# Patient Record
Sex: Female | Born: 1950 | Race: Black or African American | Hispanic: No | Marital: Single | Smoking: Former smoker
Health system: Southern US, Community
[De-identification: ages and names within clinical notes are randomized; demographics above are authoritative.]

## PROBLEM LIST (undated history)

## (undated) DIAGNOSIS — F32A Depression, unspecified: Secondary | ICD-10-CM

## (undated) DIAGNOSIS — E039 Hypothyroidism, unspecified: Secondary | ICD-10-CM

## (undated) DIAGNOSIS — M1990 Unspecified osteoarthritis, unspecified site: Secondary | ICD-10-CM

## (undated) DIAGNOSIS — W19XXXA Unspecified fall, initial encounter: Secondary | ICD-10-CM

## (undated) DIAGNOSIS — K2270 Barrett's esophagus without dysplasia: Secondary | ICD-10-CM

## (undated) DIAGNOSIS — D126 Benign neoplasm of colon, unspecified: Secondary | ICD-10-CM

## (undated) DIAGNOSIS — I82409 Acute embolism and thrombosis of unspecified deep veins of unspecified lower extremity: Secondary | ICD-10-CM

## (undated) DIAGNOSIS — F329 Major depressive disorder, single episode, unspecified: Secondary | ICD-10-CM

## (undated) DIAGNOSIS — F419 Anxiety disorder, unspecified: Secondary | ICD-10-CM

## (undated) DIAGNOSIS — I10 Essential (primary) hypertension: Secondary | ICD-10-CM

## (undated) DIAGNOSIS — M51369 Other intervertebral disc degeneration, lumbar region without mention of lumbar back pain or lower extremity pain: Secondary | ICD-10-CM

## (undated) DIAGNOSIS — I7090 Unspecified atherosclerosis: Secondary | ICD-10-CM

## (undated) DIAGNOSIS — M5136 Other intervertebral disc degeneration, lumbar region: Secondary | ICD-10-CM

## (undated) DIAGNOSIS — K219 Gastro-esophageal reflux disease without esophagitis: Secondary | ICD-10-CM

## (undated) DIAGNOSIS — Z923 Personal history of irradiation: Secondary | ICD-10-CM

## (undated) DIAGNOSIS — K589 Irritable bowel syndrome without diarrhea: Secondary | ICD-10-CM

## (undated) DIAGNOSIS — G2581 Restless legs syndrome: Secondary | ICD-10-CM

## (undated) DIAGNOSIS — A048 Other specified bacterial intestinal infections: Secondary | ICD-10-CM

## (undated) DIAGNOSIS — Y92009 Unspecified place in unspecified non-institutional (private) residence as the place of occurrence of the external cause: Secondary | ICD-10-CM

## (undated) DIAGNOSIS — I6529 Occlusion and stenosis of unspecified carotid artery: Secondary | ICD-10-CM

## (undated) DIAGNOSIS — R7303 Prediabetes: Secondary | ICD-10-CM

## (undated) DIAGNOSIS — H269 Unspecified cataract: Secondary | ICD-10-CM

## (undated) DIAGNOSIS — E785 Hyperlipidemia, unspecified: Secondary | ICD-10-CM

## (undated) DIAGNOSIS — E0590 Thyrotoxicosis, unspecified without thyrotoxic crisis or storm: Secondary | ICD-10-CM

## (undated) DIAGNOSIS — E119 Type 2 diabetes mellitus without complications: Secondary | ICD-10-CM

## (undated) DIAGNOSIS — I779 Disorder of arteries and arterioles, unspecified: Secondary | ICD-10-CM

## (undated) DIAGNOSIS — K5790 Diverticulosis of intestine, part unspecified, without perforation or abscess without bleeding: Secondary | ICD-10-CM

## (undated) DIAGNOSIS — I729 Aneurysm of unspecified site: Secondary | ICD-10-CM

## (undated) DIAGNOSIS — K289 Gastrojejunal ulcer, unspecified as acute or chronic, without hemorrhage or perforation: Secondary | ICD-10-CM

## (undated) HISTORY — DX: Benign neoplasm of colon, unspecified: D12.6

## (undated) HISTORY — DX: Other intervertebral disc degeneration, lumbar region: M51.36

## (undated) HISTORY — DX: Occlusion and stenosis of unspecified carotid artery: I65.29

## (undated) HISTORY — DX: Unspecified osteoarthritis, unspecified site: M19.90

## (undated) HISTORY — DX: Gastro-esophageal reflux disease without esophagitis: K21.9

## (undated) HISTORY — DX: Barrett's esophagus without dysplasia: K22.70

## (undated) HISTORY — DX: Anxiety disorder, unspecified: F41.9

## (undated) HISTORY — PX: TUBAL LIGATION: SHX77

## (undated) HISTORY — DX: Other specified bacterial intestinal infections: A04.8

## (undated) HISTORY — DX: Unspecified atherosclerosis: I70.90

## (undated) HISTORY — DX: Diverticulosis of intestine, part unspecified, without perforation or abscess without bleeding: K57.90

## (undated) HISTORY — DX: Acute embolism and thrombosis of unspecified deep veins of unspecified lower extremity: I82.409

## (undated) HISTORY — DX: Restless legs syndrome: G25.81

## (undated) HISTORY — DX: Other intervertebral disc degeneration, lumbar region without mention of lumbar back pain or lower extremity pain: M51.369

## (undated) HISTORY — PX: BRAIN SURGERY: SHX531

## (undated) HISTORY — DX: Unspecified fall, initial encounter: W19.XXXA

## (undated) HISTORY — DX: Unspecified place in unspecified non-institutional (private) residence as the place of occurrence of the external cause: Y92.009

## (undated) HISTORY — DX: Irritable bowel syndrome, unspecified: K58.9

## (undated) HISTORY — PX: LEG SURGERY: SHX1003

---
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
## 1998-12-18 ENCOUNTER — Other Ambulatory Visit: Admission: RE | Admit: 1998-12-18 | Discharge: 1998-12-18 | Payer: Self-pay | Admitting: Obstetrics and Gynecology

## 1999-09-12 ENCOUNTER — Emergency Department (HOSPITAL_COMMUNITY): Admission: EM | Admit: 1999-09-12 | Discharge: 1999-09-12 | Payer: Self-pay | Admitting: Emergency Medicine

## 2000-04-15 ENCOUNTER — Emergency Department (HOSPITAL_COMMUNITY): Admission: EM | Admit: 2000-04-15 | Discharge: 2000-04-15 | Payer: Self-pay | Admitting: Emergency Medicine

## 2000-04-15 ENCOUNTER — Encounter: Payer: Self-pay | Admitting: Emergency Medicine

## 2001-08-28 ENCOUNTER — Encounter: Payer: Self-pay | Admitting: Emergency Medicine

## 2001-08-28 ENCOUNTER — Emergency Department (HOSPITAL_COMMUNITY): Admission: EM | Admit: 2001-08-28 | Discharge: 2001-08-28 | Payer: Self-pay | Admitting: Emergency Medicine

## 2002-09-30 ENCOUNTER — Ambulatory Visit (HOSPITAL_COMMUNITY): Admission: RE | Admit: 2002-09-30 | Discharge: 2002-09-30 | Payer: Self-pay | Admitting: Internal Medicine

## 2002-09-30 ENCOUNTER — Encounter: Payer: Self-pay | Admitting: Internal Medicine

## 2002-11-01 ENCOUNTER — Ambulatory Visit (HOSPITAL_COMMUNITY): Admission: RE | Admit: 2002-11-01 | Discharge: 2002-11-01 | Payer: Self-pay | Admitting: Internal Medicine

## 2002-11-01 ENCOUNTER — Encounter: Payer: Self-pay | Admitting: Internal Medicine

## 2002-12-26 ENCOUNTER — Ambulatory Visit (HOSPITAL_COMMUNITY): Admission: RE | Admit: 2002-12-26 | Discharge: 2002-12-26 | Payer: Self-pay | Admitting: Internal Medicine

## 2003-02-06 ENCOUNTER — Emergency Department (HOSPITAL_COMMUNITY): Admission: EM | Admit: 2003-02-06 | Discharge: 2003-02-06 | Payer: Self-pay | Admitting: Emergency Medicine

## 2003-11-14 ENCOUNTER — Ambulatory Visit: Payer: Self-pay | Admitting: *Deleted

## 2003-12-18 ENCOUNTER — Ambulatory Visit: Payer: Self-pay | Admitting: Family Medicine

## 2004-06-15 ENCOUNTER — Ambulatory Visit: Payer: Self-pay | Admitting: Family Medicine

## 2004-08-19 ENCOUNTER — Ambulatory Visit: Payer: Self-pay | Admitting: Obstetrics and Gynecology

## 2004-08-20 ENCOUNTER — Ambulatory Visit: Payer: Self-pay | Admitting: Family Medicine

## 2004-09-03 ENCOUNTER — Ambulatory Visit (HOSPITAL_COMMUNITY): Admission: RE | Admit: 2004-09-03 | Discharge: 2004-09-03 | Payer: Self-pay | Admitting: *Deleted

## 2005-02-17 ENCOUNTER — Emergency Department (HOSPITAL_COMMUNITY): Admission: EM | Admit: 2005-02-17 | Discharge: 2005-02-17 | Payer: Self-pay | Admitting: Emergency Medicine

## 2005-03-02 ENCOUNTER — Ambulatory Visit: Payer: Self-pay | Admitting: Family Medicine

## 2005-04-07 ENCOUNTER — Ambulatory Visit: Payer: Self-pay | Admitting: Family Medicine

## 2005-05-02 ENCOUNTER — Ambulatory Visit: Payer: Self-pay | Admitting: Family Medicine

## 2005-05-16 ENCOUNTER — Encounter: Admission: RE | Admit: 2005-05-16 | Discharge: 2005-05-16 | Payer: Self-pay | Admitting: Orthopedic Surgery

## 2005-07-27 ENCOUNTER — Ambulatory Visit: Payer: Self-pay | Admitting: Obstetrics & Gynecology

## 2005-07-27 ENCOUNTER — Encounter: Payer: Self-pay | Admitting: Obstetrics & Gynecology

## 2005-08-09 ENCOUNTER — Encounter: Admission: RE | Admit: 2005-08-09 | Discharge: 2005-08-09 | Payer: Self-pay | Admitting: Obstetrics & Gynecology

## 2005-08-22 ENCOUNTER — Emergency Department (HOSPITAL_COMMUNITY): Admission: EM | Admit: 2005-08-22 | Discharge: 2005-08-22 | Payer: Self-pay | Admitting: Emergency Medicine

## 2005-09-13 ENCOUNTER — Ambulatory Visit: Payer: Self-pay | Admitting: Family Medicine

## 2005-09-20 ENCOUNTER — Encounter: Admission: RE | Admit: 2005-09-20 | Discharge: 2005-09-20 | Payer: Self-pay | Admitting: Family Medicine

## 2006-12-06 ENCOUNTER — Ambulatory Visit (HOSPITAL_COMMUNITY): Admission: RE | Admit: 2006-12-06 | Discharge: 2006-12-06 | Payer: Self-pay | Admitting: Family Medicine

## 2007-03-19 ENCOUNTER — Other Ambulatory Visit: Admission: RE | Admit: 2007-03-19 | Discharge: 2007-03-19 | Payer: Self-pay | Admitting: Family Medicine

## 2008-05-06 ENCOUNTER — Ambulatory Visit (HOSPITAL_COMMUNITY): Admission: RE | Admit: 2008-05-06 | Discharge: 2008-05-06 | Payer: Self-pay | Admitting: Family Medicine

## 2008-05-08 ENCOUNTER — Other Ambulatory Visit: Admission: RE | Admit: 2008-05-08 | Discharge: 2008-05-08 | Payer: Self-pay | Admitting: Family Medicine

## 2008-05-08 ENCOUNTER — Encounter: Admission: RE | Admit: 2008-05-08 | Discharge: 2008-05-08 | Payer: Self-pay | Admitting: Family Medicine

## 2008-05-19 ENCOUNTER — Ambulatory Visit (HOSPITAL_COMMUNITY): Admission: RE | Admit: 2008-05-19 | Discharge: 2008-05-19 | Payer: Self-pay | Admitting: Family Medicine

## 2008-12-19 ENCOUNTER — Emergency Department (HOSPITAL_COMMUNITY): Admission: EM | Admit: 2008-12-19 | Discharge: 2008-12-19 | Payer: Self-pay | Admitting: Emergency Medicine

## 2009-05-28 ENCOUNTER — Ambulatory Visit (HOSPITAL_COMMUNITY): Admission: RE | Admit: 2009-05-28 | Discharge: 2009-05-28 | Payer: Self-pay | Admitting: Family Medicine

## 2009-09-23 ENCOUNTER — Other Ambulatory Visit: Admission: RE | Admit: 2009-09-23 | Discharge: 2009-09-23 | Payer: Self-pay | Admitting: Family Medicine

## 2009-09-28 ENCOUNTER — Encounter: Admission: RE | Admit: 2009-09-28 | Discharge: 2009-09-28 | Payer: Self-pay | Admitting: Family Medicine

## 2009-10-17 ENCOUNTER — Encounter: Admission: RE | Admit: 2009-10-17 | Discharge: 2009-10-17 | Payer: Self-pay | Admitting: Family Medicine

## 2009-11-02 ENCOUNTER — Ambulatory Visit: Payer: Self-pay | Admitting: Surgery

## 2009-11-11 ENCOUNTER — Encounter: Payer: Self-pay | Admitting: Surgery

## 2009-11-27 ENCOUNTER — Ambulatory Visit (HOSPITAL_COMMUNITY): Admission: RE | Admit: 2009-11-27 | Discharge: 2009-11-27 | Payer: Self-pay | Admitting: Interventional Radiology

## 2009-12-04 ENCOUNTER — Encounter: Payer: Self-pay | Admitting: Interventional Radiology

## 2010-01-28 ENCOUNTER — Inpatient Hospital Stay (HOSPITAL_COMMUNITY)
Admission: RE | Admit: 2010-01-28 | Discharge: 2010-01-29 | Payer: Self-pay | Source: Home / Self Care | Attending: Interventional Radiology | Admitting: Interventional Radiology

## 2010-01-28 HISTORY — PX: ANEURYSM COILING: SHX5349

## 2010-02-12 ENCOUNTER — Ambulatory Visit (HOSPITAL_COMMUNITY)
Admission: RE | Admit: 2010-02-12 | Discharge: 2010-02-12 | Payer: Self-pay | Source: Home / Self Care | Attending: Interventional Radiology | Admitting: Interventional Radiology

## 2010-02-28 ENCOUNTER — Encounter: Payer: Self-pay | Admitting: *Deleted

## 2010-04-04 ENCOUNTER — Emergency Department (HOSPITAL_COMMUNITY)
Admission: EM | Admit: 2010-04-04 | Discharge: 2010-04-04 | Disposition: A | Discharge status: Home / Self Care | Payer: BC Managed Care – PPO | Attending: Emergency Medicine | Admitting: Emergency Medicine

## 2010-04-04 ENCOUNTER — Emergency Department (HOSPITAL_COMMUNITY): Payer: BC Managed Care – PPO

## 2010-04-04 DIAGNOSIS — E785 Hyperlipidemia, unspecified: Secondary | ICD-10-CM | POA: Insufficient documentation

## 2010-04-04 DIAGNOSIS — E039 Hypothyroidism, unspecified: Secondary | ICD-10-CM | POA: Insufficient documentation

## 2010-04-04 DIAGNOSIS — I10 Essential (primary) hypertension: Secondary | ICD-10-CM | POA: Insufficient documentation

## 2010-04-04 DIAGNOSIS — R51 Headache: Secondary | ICD-10-CM | POA: Insufficient documentation

## 2010-04-04 LAB — BASIC METABOLIC PANEL
BUN: 20 mg/dL (ref 6–23)
CO2: 24 mEq/L (ref 19–32)
Calcium: 9 mg/dL (ref 8.4–10.5)
Chloride: 104 mEq/L (ref 96–112)
Creatinine, Ser: 1 mg/dL (ref 0.4–1.2)
GFR calc Af Amer: >60 mL/min (ref >60)
GFR calc non Af Amer: 57 mL/min — ABNORMAL LOW (ref >60)
Glucose, Bld: 107 mg/dL — ABNORMAL HIGH (ref 70–99)
Potassium: 3.5 mEq/L (ref 3.5–5.1)
Sodium: 135 mEq/L (ref 135–145)

## 2010-04-04 LAB — CBC
HCT: 43.6 % (ref 36.0–46.0)
Hemoglobin: 14.7 g/dL (ref 12.0–15.0)
MCH: 28.2 pg (ref 26.0–34.0)
MCHC: 33.7 g/dL (ref 30.0–36.0)
MCV: 83.5 fL (ref 78.0–100.0)
Platelets: 264 10*3/uL (ref 150–400)
RBC: 5.22 MIL/uL — ABNORMAL HIGH (ref 3.87–5.11)
RDW: 14.5 % (ref 11.5–15.5)
WBC: 6.9 10*3/uL (ref 4.0–10.5)

## 2010-04-04 LAB — DIFFERENTIAL
Basophils Absolute: 0 10*3/uL (ref 0.0–0.1)
Basophils Relative: 0 % (ref 0–1)
Eosinophils Absolute: 0 10*3/uL (ref 0.0–0.7)
Eosinophils Relative: 0 % (ref 0–5)
Lymphocytes Relative: 31 % (ref 12–46)
Lymphs Abs: 2.2 10*3/uL (ref 0.7–4.0)
Monocytes Absolute: 0.4 10*3/uL (ref 0.1–1.0)
Monocytes Relative: 6 % (ref 3–12)
Neutro Abs: 4.3 10*3/uL (ref 1.7–7.7)
Neutrophils Relative %: 63 % (ref 43–77)

## 2010-04-04 LAB — SEDIMENTATION RATE: Sed Rate: 10 mm/hr (ref 0–22)

## 2010-04-19 LAB — DIFFERENTIAL
Basophils Absolute: 0.1 10*3/uL (ref 0.0–0.1)
Basophils Relative: 1 % (ref 0–1)
Eosinophils Absolute: 0 10*3/uL (ref 0.0–0.7)
Eosinophils Relative: 0 % (ref 0–5)
Lymphocytes Relative: 34 % (ref 12–46)
Lymphs Abs: 2.1 10*3/uL (ref 0.7–4.0)
Monocytes Absolute: 0.5 10*3/uL (ref 0.1–1.0)
Monocytes Relative: 7 % (ref 3–12)
Neutro Abs: 3.5 10*3/uL (ref 1.7–7.7)
Neutrophils Relative %: 58 % (ref 43–77)

## 2010-04-19 LAB — BASIC METABOLIC PANEL
BUN: 20 mg/dL (ref 6–23)
BUN: 8 mg/dL (ref 6–23)
CO2: 26 mEq/L (ref 19–32)
CO2: 30 mEq/L (ref 19–32)
Calcium: 8.3 mg/dL — ABNORMAL LOW (ref 8.4–10.5)
Calcium: 9.2 mg/dL (ref 8.4–10.5)
Chloride: 106 mEq/L (ref 96–112)
Chloride: 109 mEq/L (ref 96–112)
Creatinine, Ser: 0.88 mg/dL (ref 0.4–1.2)
Creatinine, Ser: 1.07 mg/dL (ref 0.4–1.2)
GFR calc Af Amer: >60 mL/min (ref >60)
GFR calc Af Amer: >60 mL/min (ref >60)
GFR calc non Af Amer: 52 mL/min — ABNORMAL LOW (ref >60)
GFR calc non Af Amer: >60 mL/min (ref >60)
Glucose, Bld: 101 mg/dL — ABNORMAL HIGH (ref 70–99)
Glucose, Bld: 115 mg/dL — ABNORMAL HIGH (ref 70–99)
Potassium: 3.5 mEq/L (ref 3.5–5.1)
Potassium: 4.2 mEq/L (ref 3.5–5.1)
Sodium: 139 mEq/L (ref 135–145)
Sodium: 139 mEq/L (ref 135–145)

## 2010-04-19 LAB — CBC
HCT: 39.1 % (ref 36.0–46.0)
HCT: 44.7 % (ref 36.0–46.0)
Hemoglobin: 13.3 g/dL (ref 12.0–15.0)
Hemoglobin: 14.8 g/dL (ref 12.0–15.0)
MCH: 28.4 pg (ref 26.0–34.0)
MCH: 28.7 pg (ref 26.0–34.0)
MCHC: 33.1 g/dL (ref 30.0–36.0)
MCHC: 34 g/dL (ref 30.0–36.0)
MCV: 84.3 fL (ref 78.0–100.0)
MCV: 85.8 fL (ref 78.0–100.0)
Platelets: 230 10*3/uL (ref 150–400)
Platelets: 248 10*3/uL (ref 150–400)
RBC: 4.64 MIL/uL (ref 3.87–5.11)
RBC: 5.21 MIL/uL — ABNORMAL HIGH (ref 3.87–5.11)
RDW: 14.5 % (ref 11.5–15.5)
RDW: 14.6 % (ref 11.5–15.5)
WBC: 6.1 10*3/uL (ref 4.0–10.5)
WBC: 8.1 10*3/uL (ref 4.0–10.5)

## 2010-04-19 LAB — APTT
aPTT: 31 seconds (ref 24–37)
aPTT: 40 seconds — ABNORMAL HIGH (ref 24–37)

## 2010-04-19 LAB — SURGICAL PCR SCREEN
MRSA, PCR: NEGATIVE
Staphylococcus aureus: NEGATIVE

## 2010-04-19 LAB — HEPARIN LEVEL (UNFRACTIONATED): Heparin Unfractionated: <0.1 IU/mL — ABNORMAL LOW (ref 0.30–0.70)

## 2010-04-19 LAB — PROTIME-INR
INR: 0.93 (ref 0.00–1.49)
INR: 1.02 (ref 0.00–1.49)
Prothrombin Time: 12.7 seconds (ref 11.6–15.2)
Prothrombin Time: 13.6 seconds (ref 11.6–15.2)

## 2010-04-21 LAB — BASIC METABOLIC PANEL
BUN: 18 mg/dL (ref 6–23)
CO2: 30 mEq/L (ref 19–32)
Calcium: 9.3 mg/dL (ref 8.4–10.5)
Chloride: 106 mEq/L (ref 96–112)
Creatinine, Ser: 0.99 mg/dL (ref 0.4–1.2)
GFR calc Af Amer: >60 mL/min (ref >60)
GFR calc non Af Amer: 57 mL/min — ABNORMAL LOW (ref >60)
Glucose, Bld: 94 mg/dL (ref 70–99)
Potassium: 3.7 mEq/L (ref 3.5–5.1)
Sodium: 142 mEq/L (ref 135–145)

## 2010-04-21 LAB — APTT: aPTT: 28 seconds (ref 24–37)

## 2010-04-21 LAB — PROTIME-INR
INR: 0.97 (ref 0.00–1.49)
Prothrombin Time: 13.1 seconds (ref 11.6–15.2)

## 2010-04-21 LAB — CBC
HCT: 46.8 % — ABNORMAL HIGH (ref 36.0–46.0)
Hemoglobin: 16.1 g/dL — ABNORMAL HIGH (ref 12.0–15.0)
MCH: 29.3 pg (ref 26.0–34.0)
MCHC: 34.4 g/dL (ref 30.0–36.0)
MCV: 85.1 fL (ref 78.0–100.0)
Platelets: 238 10*3/uL (ref 150–400)
RBC: 5.5 MIL/uL — ABNORMAL HIGH (ref 3.87–5.11)
RDW: 14.4 % (ref 11.5–15.5)
WBC: 5.6 10*3/uL (ref 4.0–10.5)

## 2010-05-12 LAB — URINALYSIS, ROUTINE W REFLEX MICROSCOPIC
Bilirubin Urine: NEGATIVE
Glucose, UA: NEGATIVE mg/dL
Hgb urine dipstick: NEGATIVE
Ketones, ur: NEGATIVE mg/dL
Nitrite: NEGATIVE
Protein, ur: NEGATIVE mg/dL
Specific Gravity, Urine: 1.01 (ref 1.005–1.030)
Urobilinogen, UA: 1 mg/dL (ref 0.0–1.0)
pH: 7.5 (ref 5.0–8.0)

## 2010-05-12 LAB — URINE MICROSCOPIC-ADD ON

## 2010-06-14 ENCOUNTER — Other Ambulatory Visit (HOSPITAL_COMMUNITY): Payer: Self-pay | Admitting: Interventional Radiology

## 2010-06-14 DIAGNOSIS — I771 Stricture of artery: Secondary | ICD-10-CM

## 2010-06-22 NOTE — Assessment & Plan Note (Signed)
OFFICE VISIT   Vicki Page, Vicki Page  DOB:  [birth year]                                       11/02/2009  ZOXWR#:[id number]   REASON FOR VISIT:  Evaluate carotid disease.   This is a 60 year old female who comes to me with the diagnosis of  headaches.  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   The patient states that within the last month or two she has been  developing headaches.  They are associated with an aura.  She describe  as near passing out and blurry vision.  She has had several these  episodes, the most recent was a couple of weeks ago.  They are  aggravated by loud noise.  They are relieved by getting into a quiet  dark place.  She has a history of being diagnosed with migraines.   The patient is medically managed hypertension and hypercholesterolemia.   REVIEW OF SYSTEMS:  GI:  Negative for diarrhea or constipation.  NEURO:  Positive for dizziness and headaches.  MUSCULOSKELETAL:  Positive arthritis, joint pain, muscle pain.  All other review systems negative.   PAST MEDICAL HISTORY:  Hypertension, hypercholesterolemia, thyroid  insufficiency, migraine headaches.   SOCIAL HISTORY:  She is single, works as a Architectural technologist, smokes a  half pack a day, does not drink alcohol.   FAMILY HISTORY:  Positive for premature cardiovascular disease in her  mother, father and brother.   PHYSICAL EXAMINATION:  Heart rate 91, blood pressure 145/81, temperature  is 98.2.  GENERAL:  She is well-appearing, in no distress.  HEENT:  Within normal limits.  LUNGS:  Clear bilaterally.  CARDIOVASCULAR:  Regular rate and rhythm.  No carotid bruits.  Palpable  pedal pulses.  ABDOMEN:  Soft, nontender.  MUSCULOSKELETAL:  Without major deformity.  NEURO:  No focal deficits.  SKIN:  Without rash.   Page  have reviewed her MRA and her carotid ultrasound which showed the  above findings.   ASSESSMENT/PLAN:  Page agree that the patient's symptoms sound most  consistent with migraine with aura.  I do not think her atherosclerotic  changes in her carotid artery are contributing to her symptoms.  She  does not endorse any symptoms consistent with a stroke or TIA.  Her  headaches are bilateral and her stenosis is largely on the right  intracranial portion of the carotid artery.  Page have recommended to the  patient that she receive annual follow-up for extracranial carotid  disease.  Page have scheduled her ultrasound in my office in 1 year.  Since  she does have severe stenosis in the intracranial carotid artery, Page am  recommending that she see Dr. Corliss Skains for further evaluation of this.  However, with a current symptomatology, I do not think that she would  require any intervention at this time.  Page would recommend continuing  with her medical management of her hypercholesterolemia and hypertension  as well as continuation of an aspirin.     Jorge Ny, MD  Electronically Signed   VWB/MEDQ  D:  11/02/2009  T:  11/03/2009  Job:  [id number]

## 2010-06-25 NOTE — Group Therapy Note (Signed)
NAME:  Vicki Page, Vicki Page NO.:  [id number]   MEDICAL RECORD NO.:  [id number]          PATIENT TYPE:  WOC   LOCATION:  [facility]                   FACILITY:  [facility]   PHYSICIAN:  Elsie Lincoln, MD      DATE OF BIRTH:  1950-12-15   DATE OF SERVICE:                                    CLINIC NOTE   Patient is a 60 year old female who was last seen in our clinic a year ago  for postmenopausal bleeding.  An endometrial biopsy was never able to be  done.  It was unable to admit the __________ to the os.  She did have a  transvaginal ultrasound which showed a 2.5 mm stripe.  This is reassuring.  The patient has had no postmenopausal bleeding since then.  She is in need  for a Pap smear today.  The patient is not sexually active, and she does not  have any signs of urinary incontinence.  She is complaining of pain in the  left perineal area, and she thinks a bug bit her.   PAST MEDICAL HISTORY:  No change.   PAST SURGICAL HISTORY:  No change.   MEDICATIONS:  Norvasc, multivitamin, Levothroid, hydrochlorothiazide.   GYN HISTORY:  No change.   REVIEW OF SYSTEMS:  Negative except for when the patient raises her arms  over her head, especially the right arm, she gets chest pain.  This could be  cardiac in origin and deserves a workup through [facility], which is her  primary care physician.   PHYSICAL EXAMINATION:  VITAL SIGNS:  Temperature 97.5, pulse 83, blood  pressure 138/82.  Weight 155.8.  Height 5 foot 2.  GENERAL:  Well-developed and well-nourished in no apparent distress.  HEENT:  Normocephalic and atraumatic.  NECK:  Thyroid:  No masses.  LUNGS:  Clear to auscultation bilaterally.  HEART:  Regular rate and rhythm.  ABDOMEN:  Soft, nontender, nondistended.  No hernia.  A scarred midline  incision from tubal ligation gone awry.  PELVIC:  Genitalia:  Tanner V.  Perineum:  There is a little bump that most  likely represents an ingrown hair on the left side of the  introitus.  No  evidence of an insect bite, herpes, or precancerous lesion.  Most likely an  ingrown hair.  Urethra nontender.  Bladder cystocele.  Vagina atrophic.  Cervix closed, nontender.  Uterus mobile, nontender.  Adnexa:  No masses,  nontender.  No rectovaginal lesions or bumps.  EXTREMITIES:  Nontender.  No edema.   ASSESSMENT:  A 60 year old female with no postmenopausal bleeding for a year  for a Pap smear.   PLAN:  1.  Patient had a small bump at 7 o'clock on the right breast that was also      painful.  Patient needs a diagnostic mammogram with that.  2.  Pap smear.  3.  Follow up with [facility] for rule out cardiac problems.  4.  Return to clinic in a year.           ______________________________  Elsie Lincoln, MD     KL/MEDQ  D:  07/27/2005  T:  07/27/2005  Job:  [id number]

## 2010-06-25 NOTE — Group Therapy Note (Signed)
[name], Page                ACCOUNT NO.:  [id number]   MEDICAL RECORD NO.:  [id number]          PATIENT TYPE:  WOC   LOCATION:  [facility]                   FACILITY:  [facility]   PHYSICIAN:  Tinnie Gens, MD        DATE OF BIRTH:  06-22-1950   DATE OF SERVICE:  08/20/2004                                    CLINIC NOTE   CHIEF COMPLAINT:  Pressure on vagina and occasional postmenopausal bleeding.   HISTORY OF PRESENT ILLNESS:  The patient is a 60 year old gravida 2, para 2,  who has been postmenopausal since 2004.  Her last Pap was in July 2005, last  mammogram August 2004.  She reports some spotting, but mostly her chief  complaint is pressure in the vagina.  She has noticed that more recently,  she has been having hot flashes and vaginal dryness.   PAST MEDICAL HISTORY:  1.  Arthritis.  2.  Hypertension.  3.  Thyroid problems.   PAST SURGICAL HISTORY:  BTL.   MEDICATIONS:  HCTZ, Zocor, Norvasc, and Levothroid.   ALLERGIES:  1.  DARVON.  2.  TYLENOL NO. 3.   OB HISTORY:  1.  G2.  2.  SVD x 2, the largest of which was 6 pounds 4 ounces.   GYN HISTORY:  1.  Menarche at age 89.  2.  Menopause 2004.  3.  History of abnormal Pap sometime in the 80s with a cold knife cone.  4.  Status post tubal ligation.   FAMILY HISTORY:  Diabetes, heart disease, and hypertension.   SOCIAL HISTORY:  She is a smoker, 1/2 pack a day for the last 20 years.  No  alcohol or drug use.   REVIEW OF SYSTEMS:  Fourteen point review of systems reviewed, and is  diffusely positive.  These issues are covered with her [facility], but she  does have multiple symptoms of menopause.  Please see gyn history in the  chart for further review.  The patient has no symptoms of urinary  incontinence.   PHYSICAL EXAMINATION:  VITAL SIGNS:  As noted in the chart.  GENERAL:  She is a well-developed, well-nourished black female in no acute  distress.  NECK:  Supple with normal thyroid.  She has no  supraclavicular or axillary  adenopathy.  BREASTS:  Symmetric with everted nipples.  She has some diffuse sort of  fibrocystic change but no dominant mass noted.  ABDOMEN:  Soft, nontender, nondistended.  GU:  Normal external female genitalia.  The vagina and urethra are  exceptionally atrophic and pale.  The patient has a fairly large cystocele  that comes down.  She has good posterior support, and the uterus has good  support.  The cervix was __________ and very stenotic.  Attempt was made to  obtain endometrial biopsy.  This could not be done.  On bimanual, the  patient has normal size uterus, and the adnexa are without mass or  tenderness.   IMPRESSION:  1.  Pelvic pressure, question related to cystocele which is fairly large and      comes down with Valsalva.  2.  Some postmenopausal spotting, seemingly related to atrophic vaginitis.      Should rule out endometrial carcinoma or hyperplasia.  I was unable to      obtain endometrial biopsy today.  We will get pelvic ultrasound to      evaluate endometrial stripe.  The patient may require D&C if this seems      abnormal.  Otherwise, would follow her up with another ultrasound in 6      months.  The patient would like some sort of treatment for her      cystocele.  Would recommend follow up with urology.       TP/MEDQ  D:  08/20/2004  T:  08/20/2004  Job:  [id number]

## 2010-07-22 ENCOUNTER — Encounter (HOSPITAL_COMMUNITY)
Admission: RE | Admit: 2010-07-22 | Discharge: 2010-07-22 | Disposition: A | Discharge status: Home / Self Care | Payer: BC Managed Care – PPO | Source: Ambulatory Visit | Attending: Interventional Radiology | Admitting: Interventional Radiology

## 2010-07-22 LAB — COMPREHENSIVE METABOLIC PANEL
ALT: 22 U/L (ref 0–35)
AST: 28 U/L (ref 0–37)
Albumin: 3.4 g/dL — ABNORMAL LOW (ref 3.5–5.2)
Alkaline Phosphatase: 98 U/L (ref 39–117)
BUN: 19 mg/dL (ref 6–23)
CO2: 27 mEq/L (ref 19–32)
Calcium: 9.4 mg/dL (ref 8.4–10.5)
Chloride: 105 mEq/L (ref 96–112)
Creatinine, Ser: 0.96 mg/dL (ref 0.4–1.2)
GFR calc Af Amer: >60 mL/min (ref >60)
GFR calc non Af Amer: 59 mL/min — ABNORMAL LOW (ref >60)
Glucose, Bld: 84 mg/dL (ref 70–99)
Potassium: 4.4 mEq/L (ref 3.5–5.1)
Sodium: 142 mEq/L (ref 135–145)
Total Bilirubin: 0.4 mg/dL (ref 0.3–1.2)
Total Protein: 6.7 g/dL (ref 6.0–8.3)

## 2010-07-22 LAB — CBC
HCT: 43 % (ref 36.0–46.0)
Hemoglobin: 15 g/dL (ref 12.0–15.0)
MCH: 29.1 pg (ref 26.0–34.0)
MCHC: 34.9 g/dL (ref 30.0–36.0)
MCV: 83.5 fL (ref 78.0–100.0)
Platelets: 262 10*3/uL (ref 150–400)
RBC: 5.15 MIL/uL — ABNORMAL HIGH (ref 3.87–5.11)
RDW: 14.7 % (ref 11.5–15.5)
WBC: 7.1 10*3/uL (ref 4.0–10.5)

## 2010-07-22 LAB — DIFFERENTIAL
Basophils Absolute: 0 10*3/uL (ref 0.0–0.1)
Basophils Relative: 0 % (ref 0–1)
Eosinophils Absolute: 0 10*3/uL (ref 0.0–0.7)
Eosinophils Relative: 0 % (ref 0–5)
Lymphocytes Relative: 26 % (ref 12–46)
Lymphs Abs: 1.8 10*3/uL (ref 0.7–4.0)
Monocytes Absolute: 0.6 10*3/uL (ref 0.1–1.0)
Monocytes Relative: 8 % (ref 3–12)
Neutro Abs: 4.7 10*3/uL (ref 1.7–7.7)
Neutrophils Relative %: 66 % (ref 43–77)

## 2010-07-22 LAB — PROTIME-INR
INR: 0.91 (ref 0.00–1.49)
Prothrombin Time: 12.5 seconds (ref 11.6–15.2)

## 2010-07-22 LAB — APTT: aPTT: 29 seconds (ref 24–37)

## 2010-07-26 ENCOUNTER — Ambulatory Visit (HOSPITAL_COMMUNITY)
Admission: RE | Admit: 2010-07-26 | Discharge: 2010-07-26 | Disposition: A | Discharge status: Home / Self Care | Payer: Managed Care, Other (non HMO) | Source: Ambulatory Visit | Attending: Interventional Radiology | Admitting: Interventional Radiology

## 2010-07-26 ENCOUNTER — Ambulatory Visit (HOSPITAL_COMMUNITY)
Admission: RE | Admit: 2010-07-26 | Discharge: 2010-07-26 | Disposition: A | Discharge status: Home / Self Care | Payer: BC Managed Care – PPO | Source: Ambulatory Visit | Attending: Interventional Radiology | Admitting: Interventional Radiology

## 2010-07-26 DIAGNOSIS — I658 Occlusion and stenosis of other precerebral arteries: Secondary | ICD-10-CM | POA: Insufficient documentation

## 2010-07-26 DIAGNOSIS — I671 Cerebral aneurysm, nonruptured: Secondary | ICD-10-CM | POA: Insufficient documentation

## 2010-07-26 DIAGNOSIS — Z9889 Other specified postprocedural states: Secondary | ICD-10-CM | POA: Insufficient documentation

## 2010-07-26 DIAGNOSIS — I6509 Occlusion and stenosis of unspecified vertebral artery: Secondary | ICD-10-CM | POA: Insufficient documentation

## 2010-07-26 DIAGNOSIS — F17200 Nicotine dependence, unspecified, uncomplicated: Secondary | ICD-10-CM | POA: Insufficient documentation

## 2010-07-26 DIAGNOSIS — M129 Arthropathy, unspecified: Secondary | ICD-10-CM | POA: Insufficient documentation

## 2010-07-26 DIAGNOSIS — Y831 Surgical operation with implant of artificial internal device as the cause of abnormal reaction of the patient, or of later complication, without mention of misadventure at the time of the procedure: Secondary | ICD-10-CM | POA: Insufficient documentation

## 2010-07-26 DIAGNOSIS — Z01812 Encounter for preprocedural laboratory examination: Secondary | ICD-10-CM | POA: Insufficient documentation

## 2010-07-26 DIAGNOSIS — G43109 Migraine with aura, not intractable, without status migrainosus: Secondary | ICD-10-CM | POA: Insufficient documentation

## 2010-07-26 DIAGNOSIS — I6529 Occlusion and stenosis of unspecified carotid artery: Secondary | ICD-10-CM | POA: Insufficient documentation

## 2010-07-26 DIAGNOSIS — I651 Occlusion and stenosis of basilar artery: Secondary | ICD-10-CM | POA: Insufficient documentation

## 2010-07-26 DIAGNOSIS — E039 Hypothyroidism, unspecified: Secondary | ICD-10-CM | POA: Insufficient documentation

## 2010-07-26 DIAGNOSIS — T82898A Other specified complication of vascular prosthetic devices, implants and grafts, initial encounter: Secondary | ICD-10-CM | POA: Insufficient documentation

## 2010-07-26 DIAGNOSIS — K219 Gastro-esophageal reflux disease without esophagitis: Secondary | ICD-10-CM | POA: Insufficient documentation

## 2010-07-26 DIAGNOSIS — I10 Essential (primary) hypertension: Secondary | ICD-10-CM | POA: Insufficient documentation

## 2010-07-26 DIAGNOSIS — G43909 Migraine, unspecified, not intractable, without status migrainosus: Secondary | ICD-10-CM | POA: Insufficient documentation

## 2010-07-26 DIAGNOSIS — I771 Stricture of artery: Secondary | ICD-10-CM

## 2010-07-26 HISTORY — PX: ANEURYSM COILING: SHX5349

## 2010-07-26 LAB — PLATELET INHIBITION P2Y12
P2Y12 % Inhibition: 48 %
Platelet Function  P2Y12: 138 [PRU] — ABNORMAL LOW (ref 194–418)
Platelet Function Baseline: 266 [PRU] (ref 194–418)

## 2010-07-26 MED ORDER — IOHEXOL 300 MG/ML  SOLN
200.0000 mL | Freq: Once | INTRAMUSCULAR | Status: AC | PRN
  Administered 2010-07-26: 80 mL via INTRAVENOUS

## 2010-08-24 ENCOUNTER — Other Ambulatory Visit (HOSPITAL_COMMUNITY): Payer: Self-pay | Admitting: Family Medicine

## 2010-08-24 DIAGNOSIS — Z1231 Encounter for screening mammogram for malignant neoplasm of breast: Secondary | ICD-10-CM

## 2010-09-02 ENCOUNTER — Ambulatory Visit (HOSPITAL_COMMUNITY)
Admission: RE | Admit: 2010-09-02 | Discharge: 2010-09-02 | Disposition: A | Discharge status: Home / Self Care | Payer: BC Managed Care – PPO | Source: Ambulatory Visit | Attending: Family Medicine | Admitting: Family Medicine

## 2010-09-02 DIAGNOSIS — Z1231 Encounter for screening mammogram for malignant neoplasm of breast: Secondary | ICD-10-CM

## 2010-11-08 ENCOUNTER — Other Ambulatory Visit: Payer: Self-pay

## 2010-11-23 ENCOUNTER — Other Ambulatory Visit: Payer: Self-pay | Admitting: Physician Assistant

## 2010-11-23 ENCOUNTER — Ambulatory Visit
Admission: RE | Admit: 2010-11-23 | Discharge: 2010-11-23 | Disposition: A | Discharge status: Home / Self Care | Payer: Managed Care, Other (non HMO) | Source: Ambulatory Visit | Attending: Physician Assistant | Admitting: Physician Assistant

## 2010-11-23 DIAGNOSIS — M25569 Pain in unspecified knee: Secondary | ICD-10-CM

## 2010-11-29 ENCOUNTER — Ambulatory Visit (INDEPENDENT_AMBULATORY_CARE_PROVIDER_SITE_OTHER): Payer: Managed Care, Other (non HMO) | Admitting: Vascular Surgery

## 2010-11-29 DIAGNOSIS — I6529 Occlusion and stenosis of unspecified carotid artery: Secondary | ICD-10-CM

## 2010-12-21 NOTE — Procedures (Unsigned)
CAROTID DUPLEX EXAM  INDICATION:  Carotid stenosis.  HISTORY: Diabetes:  No. Cardiac:  No. Hypertension:  Yes. Smoking:  Currently. Previous Surgery:  No carotid intervention. CV History:  Asymptomatic, headaches. Amaurosis Fugax No, Paresthesias No, Hemiparesis No.                                      RIGHT             LEFT Brachial systolic pressure:         136               132 Brachial Doppler waveforms:         WNL               WNL Vertebral direction of flow:        Antegrade         Antegrade DUPLEX VELOCITIES (cm/sec) CCA peak systolic                   96                95 ECA peak systolic                   111               89 ICA peak systolic                   55                140 ICA end diastolic                   15                137 PLAQUE MORPHOLOGY:                  Not visualized    Heterogenous PLAQUE AMOUNT:                      Not visualized    Mild PLAQUE LOCATION:                    Not visualized    CCA/ICA  IMPRESSION: 1. Right internal carotid artery appears patent and without evidence     of hemodynamically significant plaque or stenosis identified. 2. Left internal carotid artery stenosis in the 1% to 39% range (high     end of range). 3. Bilateral internal carotid arteries present with vessel tortuosity     in the mid to distal segments. 4. Bilateral vertebral arteries are patent and antegrade.  ___________________________________________ V. Charlena Cross, MD  SH/MEDQ  D:  11/29/2010  T:  11/29/2010  Job:  [id number]

## 2011-05-05 ENCOUNTER — Other Ambulatory Visit (HOSPITAL_COMMUNITY): Payer: Self-pay | Admitting: Interventional Radiology

## 2011-05-05 DIAGNOSIS — I729 Aneurysm of unspecified site: Secondary | ICD-10-CM

## 2011-05-09 NOTE — Progress Notes (Signed)
Patient ID: Vicki Page, female   DOB: [birth year], 61 y.o.   MRN: [id number]  Pt is scheduled for MRI/MRA 05/12/11 at 1pm She is claustrophobic Request for meds made by pt  Called to [facility]: Xanax 0.5 mg #1 To take 30 mins before 1pm MRI  Discussed with Dr Corliss Skains; agreeable

## 2011-05-12 ENCOUNTER — Ambulatory Visit (HOSPITAL_COMMUNITY)
Admission: RE | Admit: 2011-05-12 | Discharge: 2011-05-12 | Disposition: A | Discharge status: Home / Self Care | Payer: Managed Care, Other (non HMO) | Source: Ambulatory Visit | Attending: Interventional Radiology | Admitting: Interventional Radiology

## 2011-05-12 DIAGNOSIS — I671 Cerebral aneurysm, nonruptured: Secondary | ICD-10-CM | POA: Insufficient documentation

## 2011-05-12 DIAGNOSIS — I6509 Occlusion and stenosis of unspecified vertebral artery: Secondary | ICD-10-CM | POA: Insufficient documentation

## 2011-05-12 DIAGNOSIS — I6529 Occlusion and stenosis of unspecified carotid artery: Secondary | ICD-10-CM | POA: Insufficient documentation

## 2011-05-12 DIAGNOSIS — I729 Aneurysm of unspecified site: Secondary | ICD-10-CM

## 2011-05-12 LAB — CREATININE, SERUM
Creatinine, Ser: 1.32 mg/dL — ABNORMAL HIGH (ref 0.50–1.10)
GFR calc Af Amer: 49 mL/min — ABNORMAL LOW (ref >90)
GFR calc non Af Amer: 43 mL/min — ABNORMAL LOW (ref >90)

## 2011-05-12 LAB — BUN: BUN: 27 mg/dL — ABNORMAL HIGH (ref 6–23)

## 2011-05-12 MED ORDER — GADOBENATE DIMEGLUMINE 529 MG/ML IV SOLN
14.0000 mL | Freq: Once | INTRAVENOUS | Status: AC | PRN
  Administered 2011-05-12: 14 mL via INTRAVENOUS

## 2011-05-13 ENCOUNTER — Other Ambulatory Visit (HOSPITAL_COMMUNITY): Payer: Managed Care, Other (non HMO)

## 2011-05-13 ENCOUNTER — Ambulatory Visit (HOSPITAL_COMMUNITY): Payer: Managed Care, Other (non HMO)

## 2011-06-17 ENCOUNTER — Telehealth (HOSPITAL_COMMUNITY): Payer: Self-pay

## 2011-06-17 NOTE — Telephone Encounter (Signed)
Mrs. Andrades called in asking what her F/u was because she was having memory issues.  Per Dr. Corliss Skains he wants to know if the pt is still taking ASA and Plavix, diabetic, and are her symptoms getting worse?  I have left a vm for the pt to call back.

## 2011-08-03 ENCOUNTER — Other Ambulatory Visit (HOSPITAL_COMMUNITY): Payer: Self-pay | Admitting: Family Medicine

## 2011-08-03 DIAGNOSIS — Z1231 Encounter for screening mammogram for malignant neoplasm of breast: Secondary | ICD-10-CM

## 2011-08-28 ENCOUNTER — Emergency Department (HOSPITAL_COMMUNITY): Payer: No Typology Code available for payment source

## 2011-08-28 ENCOUNTER — Emergency Department (HOSPITAL_COMMUNITY)
Admission: EM | Admit: 2011-08-28 | Discharge: 2011-08-28 | Disposition: A | Discharge status: Home / Self Care | Payer: No Typology Code available for payment source | Attending: Emergency Medicine | Admitting: Emergency Medicine

## 2011-08-28 ENCOUNTER — Encounter (HOSPITAL_COMMUNITY): Payer: Self-pay | Admitting: Emergency Medicine

## 2011-08-28 DIAGNOSIS — R51 Headache: Secondary | ICD-10-CM | POA: Insufficient documentation

## 2011-08-28 DIAGNOSIS — M542 Cervicalgia: Secondary | ICD-10-CM | POA: Insufficient documentation

## 2011-08-28 DIAGNOSIS — IMO0002 Reserved for concepts with insufficient information to code with codable children: Secondary | ICD-10-CM | POA: Insufficient documentation

## 2011-08-28 DIAGNOSIS — M545 Low back pain, unspecified: Secondary | ICD-10-CM | POA: Insufficient documentation

## 2011-08-28 DIAGNOSIS — Z79899 Other long term (current) drug therapy: Secondary | ICD-10-CM | POA: Insufficient documentation

## 2011-08-28 DIAGNOSIS — R079 Chest pain, unspecified: Secondary | ICD-10-CM | POA: Insufficient documentation

## 2011-08-28 DIAGNOSIS — I10 Essential (primary) hypertension: Secondary | ICD-10-CM | POA: Insufficient documentation

## 2011-08-28 DIAGNOSIS — E785 Hyperlipidemia, unspecified: Secondary | ICD-10-CM | POA: Insufficient documentation

## 2011-08-28 DIAGNOSIS — I729 Aneurysm of unspecified site: Secondary | ICD-10-CM | POA: Insufficient documentation

## 2011-08-28 DIAGNOSIS — E0590 Thyrotoxicosis, unspecified without thyrotoxic crisis or storm: Secondary | ICD-10-CM | POA: Insufficient documentation

## 2011-08-28 HISTORY — DX: Thyrotoxicosis, unspecified without thyrotoxic crisis or storm: E05.90

## 2011-08-28 HISTORY — DX: Essential (primary) hypertension: I10

## 2011-08-28 HISTORY — DX: Hyperlipidemia, unspecified: E78.5

## 2011-08-28 HISTORY — DX: Aneurysm of unspecified site: I72.9

## 2011-08-28 MED ORDER — IBUPROFEN 600 MG PO TABS: 600.0000 mg | ORAL_TABLET | Freq: Four times a day (QID) | ORAL | Status: AC | PRN

## 2011-08-28 MED ORDER — CYCLOBENZAPRINE HCL 10 MG PO TABS: 10.0000 mg | ORAL_TABLET | Freq: Three times a day (TID) | ORAL | Status: AC | PRN

## 2011-08-28 MED ORDER — HYDROCODONE-ACETAMINOPHEN 5-500 MG PO TABS: 2.0000 | ORAL_TABLET | Freq: Four times a day (QID) | ORAL | Status: AC | PRN

## 2011-08-28 MED ORDER — FENTANYL CITRATE 0.05 MG/ML IJ SOLN
25.0000 ug | INTRAMUSCULAR | Status: DC | PRN
  Administered 2011-08-28: 25 ug via INTRAMUSCULAR
  Filled 2011-08-28: qty 2
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
## 2011-08-28 NOTE — ED Notes (Signed)
Family members brought back to see patient.

## 2011-08-28 NOTE — ED Notes (Signed)
Dr. Opitz at bedside. 

## 2011-08-28 NOTE — ED Notes (Signed)
I gave the patient a small ice pack. 

## 2011-08-28 NOTE — ED Notes (Signed)
Patient back from x-ray; no respiratory or acute distress noted.  Patient updated on plan of care; informed patient that we are currently waiting on test results to come back.  Patient has no other questions or concerns at this time; will continue to monitor.
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
## 2011-08-28 NOTE — ED Notes (Signed)
IV not in placed; removed during a patient's previous visit.

## 2011-08-28 NOTE — ED Notes (Signed)
[employer] at bedside 

## 2011-08-28 NOTE — ED Notes (Addendum)
p 

## 2011-09-05 ENCOUNTER — Ambulatory Visit (HOSPITAL_COMMUNITY)
Admission: RE | Admit: 2011-09-05 | Discharge: 2011-09-05 | Disposition: A | Discharge status: Home / Self Care | Payer: 59 | Source: Ambulatory Visit | Attending: Family Medicine | Admitting: Family Medicine

## 2011-09-05 DIAGNOSIS — Z1231 Encounter for screening mammogram for malignant neoplasm of breast: Secondary | ICD-10-CM | POA: Insufficient documentation

## 2011-11-25 ENCOUNTER — Other Ambulatory Visit: Payer: Self-pay | Admitting: *Deleted

## 2011-11-25 DIAGNOSIS — I6529 Occlusion and stenosis of unspecified carotid artery: Secondary | ICD-10-CM

## 2011-12-02 ENCOUNTER — Encounter: Payer: Self-pay | Admitting: Neurosurgery

## 2011-12-05 ENCOUNTER — Other Ambulatory Visit: Payer: BC Managed Care – PPO

## 2011-12-05 ENCOUNTER — Ambulatory Visit: Payer: BC Managed Care – PPO | Admitting: Neurosurgery

## 2012-01-13 ENCOUNTER — Other Ambulatory Visit: Payer: BC Managed Care – PPO

## 2012-01-13 ENCOUNTER — Ambulatory Visit: Payer: BC Managed Care – PPO | Admitting: Neurosurgery

## 2012-01-25 ENCOUNTER — Encounter: Payer: Self-pay | Admitting: Neurosurgery

## 2012-01-26 ENCOUNTER — Other Ambulatory Visit (INDEPENDENT_AMBULATORY_CARE_PROVIDER_SITE_OTHER): Payer: 59 | Admitting: *Deleted

## 2012-01-26 ENCOUNTER — Encounter: Payer: Self-pay | Admitting: Neurosurgery

## 2012-01-26 ENCOUNTER — Ambulatory Visit (INDEPENDENT_AMBULATORY_CARE_PROVIDER_SITE_OTHER): Payer: BC Managed Care – PPO | Admitting: Neurosurgery

## 2012-01-26 VITALS — BP 127/77 | HR 85 | Resp 16 | Ht 63.0 in | Wt 158.0 lb

## 2012-01-26 DIAGNOSIS — I6529 Occlusion and stenosis of unspecified carotid artery: Secondary | ICD-10-CM

## 2012-01-26 NOTE — Progress Notes (Signed)
[facility] Carotid Office Note  CC: Carotid surveillance Referring Physician: Brabham  History of Present Illness: 61 year old female patient of Dr. Myra Gianotti followed for mild bilateral carotid stenosis. The patient denies any signs or symptoms of CVA, TIA, amaurosis fugax or any neural deficit. The patient denies any new medical diagnoses or recent surgery.  Past Medical History  Diagnosis Date  . Hypertension   . Aneurysm   . Hyperthyroidism   . Hyperlipidemia     ROS: [x]  Positive   [ ]  Denies    General: [ ]  Weight loss, [ ]  Fever, [ ]  chills Neurologic: [ ]  Dizziness, [ ]  Blackouts, [ ]  Seizure [ ]  Stroke, [ ]  "Mini stroke", [ ]  Slurred speech, [ ]  Temporary blindness; [ ]  weakness in arms or legs, [ ]  Hoarseness Cardiac: [ ]  Chest pain/pressure, [ ]  Shortness of breath at rest [ ]  Shortness of breath with exertion, [ ]  Atrial fibrillation or irregular heartbeat Vascular: [ ]  Pain in legs with walking, [ ]  Pain in legs at rest, [ ]  Pain in legs at night,  [ ]  Non-healing ulcer, [ ]  Blood clot in vein/DVT,   Pulmonary: [ ]  Home oxygen, [ ]  Productive cough, [ ]  Coughing up blood, [ ]  Asthma,  [ ]  Wheezing Musculoskeletal:  [ ]  Arthritis, [ ]  Low back pain, [ ]  Joint pain Hematologic: [ ]  Easy Bruising, [ ]  Anemia; [ ]  Hepatitis Gastrointestinal: [ ]  Blood in stool, [ ]  Gastroesophageal Reflux/heartburn, [ ]  Trouble swallowing Urinary: [ ]  chronic Kidney disease, [ ]  on HD - [ ]  MWF or [ ]  TTHS, [ ]  Burning with urination, [ ]  Difficulty urinating Skin: [ ]  Rashes, [ ]  Wounds Psychological: [ ]  Anxiety, [ ]  Depression   Social History History  Substance Use Topics  . Smoking status: Never Smoker   . Smokeless tobacco: Not on file  . Alcohol Use: No    Family History No family history on file.  Allergies  Allergen Reactions  . Tylenol (Acetaminophen) Nausea Only  . Darvon (Propoxyphene Hcl)     nausea  . Flagyl (Metronidazole)    nausea    Current Outpatient Prescriptions  Medication Sig Dispense Refill  . buPROPion (WELLBUTRIN SR) 150 MG 12 hr tablet Take 150 mg by mouth 2 (two) times daily.      . Calcium Carbonate-Vitamin D (CALCIUM + D PO) Take 1 tablet by mouth daily.      Marland Kitchen levothyroxine (SYNTHROID, LEVOTHROID) 25 MCG tablet Take 25 mcg by mouth daily.      Marland Kitchen losartan-hydrochlorothiazide (HYZAAR) 100-25 MG per tablet Take 1 tablet by mouth daily.      . Multiple Vitamins-Minerals (CENTRUM PO) Take 1 tablet by mouth daily.      . nebivolol (BYSTOLIC) 10 MG tablet Take 10 mg by mouth daily.      . simvastatin (ZOCOR) 40 MG tablet Take 40 mg by mouth every evening.      . vitamin B-12 (CYANOCOBALAMIN) 100 MCG tablet Take 50 mcg by mouth daily.        Physical Examination  Filed Vitals:   01/26/12 1604  BP: 127/77  Pulse: 85  Resp:     Body mass index is 27.99 kg/(m^2).  General:  WDWN in NAD Gait: Normal HEENT: WNL Eyes: Pupils equal Pulmonary: normal non-labored breathing , without Rales, rhonchi,  wheezing Cardiac: RRR, without  Murmurs, rubs or gallops; Abdomen: soft, NT, no masses Skin: no rashes, ulcers noted  Vascular Exam Pulses: 3+ radial pulses bilaterally Carotid bruits: carotid pulses to auscultation no bruits are heard Extremities without ischemic changes, no Gangrene , no cellulitis; no open wounds;  Musculoskeletal: no muscle wasting or atrophy   Neurologic: A&O X 3; Appropriate Affect ; SENSATION: normal; MOTOR FUNCTION:  moving all extremities equally. Speech is fluent/normal  Non-Invasive Vascular Imaging CAROTID DUPLEX 01/26/2012  Right ICA 20 - 39 % stenosis Left ICA 20 - 39 % stenosis   ASSESSMENT/PLAN: Asymptomatic patient with stable exam compared to one year ago. The patient will followup in one year with repeat carotid duplex. The patient's questions were encouraged and answered, she is in agreement with this plan.  Lauree Chandler ANP   Clinic MD: Darrick Penna

## 2012-04-08 ENCOUNTER — Emergency Department (HOSPITAL_COMMUNITY): Payer: BC Managed Care – PPO

## 2012-04-08 ENCOUNTER — Encounter (HOSPITAL_COMMUNITY): Payer: Self-pay | Admitting: *Deleted

## 2012-04-08 ENCOUNTER — Emergency Department (HOSPITAL_COMMUNITY)
Admission: EM | Admit: 2012-04-08 | Discharge: 2012-04-08 | Disposition: A | Discharge status: Home / Self Care | Payer: BC Managed Care – PPO | Attending: Emergency Medicine | Admitting: Emergency Medicine

## 2012-04-08 DIAGNOSIS — I10 Essential (primary) hypertension: Secondary | ICD-10-CM | POA: Insufficient documentation

## 2012-04-08 DIAGNOSIS — E0590 Thyrotoxicosis, unspecified without thyrotoxic crisis or storm: Secondary | ICD-10-CM | POA: Insufficient documentation

## 2012-04-08 DIAGNOSIS — R112 Nausea with vomiting, unspecified: Secondary | ICD-10-CM | POA: Insufficient documentation

## 2012-04-08 DIAGNOSIS — R51 Headache: Secondary | ICD-10-CM | POA: Insufficient documentation

## 2012-04-08 DIAGNOSIS — Z9889 Other specified postprocedural states: Secondary | ICD-10-CM | POA: Insufficient documentation

## 2012-04-08 DIAGNOSIS — Z79899 Other long term (current) drug therapy: Secondary | ICD-10-CM | POA: Insufficient documentation

## 2012-04-08 DIAGNOSIS — Z8679 Personal history of other diseases of the circulatory system: Secondary | ICD-10-CM | POA: Insufficient documentation

## 2012-04-08 DIAGNOSIS — E785 Hyperlipidemia, unspecified: Secondary | ICD-10-CM | POA: Insufficient documentation

## 2012-04-08 DIAGNOSIS — R197 Diarrhea, unspecified: Secondary | ICD-10-CM | POA: Insufficient documentation

## 2012-04-08 LAB — COMPREHENSIVE METABOLIC PANEL
ALT: 26 U/L (ref 0–35)
AST: 29 U/L (ref 0–37)
Albumin: 3.7 g/dL (ref 3.5–5.2)
Alkaline Phosphatase: 82 U/L (ref 39–117)
BUN: 22 mg/dL (ref 6–23)
CO2: 27 mEq/L (ref 19–32)
Calcium: 9.7 mg/dL (ref 8.4–10.5)
Chloride: 101 mEq/L (ref 96–112)
Creatinine, Ser: 1.1 mg/dL (ref 0.50–1.10)
GFR calc Af Amer: 61 mL/min — ABNORMAL LOW (ref >90)
GFR calc non Af Amer: 53 mL/min — ABNORMAL LOW (ref >90)
Glucose, Bld: 87 mg/dL (ref 70–99)
Potassium: 3.5 mEq/L (ref 3.5–5.1)
Sodium: 140 mEq/L (ref 135–145)
Total Bilirubin: 0.4 mg/dL (ref 0.3–1.2)
Total Protein: 7.3 g/dL (ref 6.0–8.3)

## 2012-04-08 LAB — URINALYSIS, MICROSCOPIC ONLY
Bilirubin Urine: NEGATIVE
Glucose, UA: NEGATIVE mg/dL
Hgb urine dipstick: NEGATIVE
Ketones, ur: 15 mg/dL — AB
Leukocytes, UA: NEGATIVE
Nitrite: NEGATIVE
Protein, ur: NEGATIVE mg/dL
Specific Gravity, Urine: 1.031 — ABNORMAL HIGH (ref 1.005–1.030)
Urobilinogen, UA: 0.2 mg/dL (ref 0.0–1.0)
pH: 5 (ref 5.0–8.0)

## 2012-04-08 LAB — CBC WITH DIFFERENTIAL/PLATELET
Basophils Absolute: 0 10*3/uL (ref 0.0–0.1)
Basophils Relative: 0 % (ref 0–1)
Eosinophils Absolute: 0 10*3/uL (ref 0.0–0.7)
Eosinophils Relative: 0 % (ref 0–5)
HCT: 45 % (ref 36.0–46.0)
Hemoglobin: 15.6 g/dL — ABNORMAL HIGH (ref 12.0–15.0)
Lymphocytes Relative: 31 % (ref 12–46)
Lymphs Abs: 1.5 10*3/uL (ref 0.7–4.0)
MCH: 28.8 pg (ref 26.0–34.0)
MCHC: 34.7 g/dL (ref 30.0–36.0)
MCV: 83.2 fL (ref 78.0–100.0)
Monocytes Absolute: 0.3 10*3/uL (ref 0.1–1.0)
Monocytes Relative: 7 % (ref 3–12)
Neutro Abs: 3 10*3/uL (ref 1.7–7.7)
Neutrophils Relative %: 62 % (ref 43–77)
Platelets: 231 10*3/uL (ref 150–400)
RBC: 5.41 MIL/uL — ABNORMAL HIGH (ref 3.87–5.11)
RDW: 14.7 % (ref 11.5–15.5)
WBC: 4.8 10*3/uL (ref 4.0–10.5)

## 2012-04-08 LAB — LIPASE, BLOOD: Lipase: 33 U/L (ref 11–59)

## 2012-04-08 MED ORDER — ONDANSETRON HCL 4 MG PO TABS: 4.0000 mg | ORAL_TABLET | Freq: Four times a day (QID) | ORAL | Status: DC

## 2012-04-08 MED ORDER — SODIUM CHLORIDE 0.9 % IV SOLN: Freq: Once | INTRAVENOUS | Status: DC

## 2012-04-08 MED ORDER — METOCLOPRAMIDE HCL 5 MG/ML IJ SOLN
10.0000 mg | Freq: Once | INTRAMUSCULAR | Status: AC
  Administered 2012-04-08: 10 mg via INTRAVENOUS
  Filled 2012-04-08: qty 2

## 2012-04-08 MED ORDER — DEXAMETHASONE SODIUM PHOSPHATE 10 MG/ML IJ SOLN
10.0000 mg | Freq: Once | INTRAMUSCULAR | Status: AC
  Administered 2012-04-08: 10 mg via INTRAVENOUS
  Filled 2012-04-08: qty 1

## 2012-04-08 MED ORDER — SODIUM CHLORIDE 0.9 % IV BOLUS (SEPSIS)
500.0000 mL | Freq: Once | INTRAVENOUS | Status: AC
  Administered 2012-04-08: 500 mL via INTRAVENOUS

## 2012-04-08 MED ORDER — DIPHENHYDRAMINE HCL 50 MG/ML IJ SOLN
12.5000 mg | Freq: Once | INTRAMUSCULAR | Status: AC
  Administered 2012-04-08: 12.5 mg via INTRAVENOUS
  Filled 2012-04-08: qty 1

## 2012-04-08 NOTE — ED Provider Notes (Signed)
History     CSN: [id number]  Arrival date & time 04/08/12  1056   First MD Initiated Contact with Patient 04/08/12 1231      Chief Complaint  Patient presents with  . Headache  . Nausea  . Diarrhea    (Consider location/radiation/quality/duration/timing/severity/associated sxs/prior treatment) Patient is a 62 y.o. female presenting with headaches and diarrhea. The history is provided by the patient.  Headache Pain location:  Frontal Radiates to:  Does not radiate Severity currently:  8/10 Severity at highest:  8/10 Onset quality:  Gradual Associated symptoms: diarrhea, nausea and vomiting   Associated symptoms: no abdominal pain, no cough, no fever and no photophobia   Associated symptoms comment:  She has had intermittent symptoms of GI illness with N, V, D for 5 days. She reports a fever on days 1 & 2 only. Two days ago she woke up with a headache that progressed in severity during the day. No alleviating or aggrevating factors. She has been able to maintain a normal diet. No bloody bowel movements or emesis. 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   Diarrhea Associated symptoms: headaches and vomiting   Associated symptoms: no abdominal pain, no chills and no fever     Past Medical History  Diagnosis Date  . Hypertension   . Aneurysm   . Hyperthyroidism   . Hyperlipidemia   . Carotid artery occlusion     Past Surgical History  Procedure Laterality Date  . Brain surgery      Per patient " Left side behind eyes  . Aneurysm coiling  July 26, 2010    stent    Family History  Problem Relation Age of Onset  . Deep vein thrombosis Mother   . Diabetes Mother     Amputation  . Heart disease Mother     Heart Disease before age 76  . Hyperlipidemia Mother   . Hypertension Mother   . Heart attack Mother   . Heart disease Father     Heart Disease before age 36  . Hypertension Father   . Heart  attack Father   . Diabetes Brother   . Heart disease Brother     Heart Disease before age 24  . Hyperlipidemia Brother   . Hypertension Brother   . Heart attack Brother   . COPD Brother   . Diabetes Brother   . Heart disease Brother     Heart Disease before age 42    History  Substance Use Topics  . Smoking status: Never Smoker   . Smokeless tobacco: Never Used  . Alcohol Use: No    OB History   Grav Para Term Preterm Abortions TAB SAB Ect Mult Living                  Review of Systems  Constitutional: Negative for fever and chills.  Eyes: Negative for photophobia and visual disturbance.  Respiratory: Negative.  Negative for cough and shortness of breath.   Cardiovascular: Negative.  Negative for chest pain.  Gastrointestinal: Positive for nausea, vomiting and diarrhea. Negative for abdominal pain.  Genitourinary: Negative for dysuria.  Musculoskeletal: Negative.   Skin: Negative.   Neurological: Positive for headaches.  Psychiatric/Behavioral: Negative for confusion.    Allergies  Tylenol; Darvon; and Flagyl  Home Medications   Current Outpatient Rx  Name  Route  Sig  Dispense  Refill  . acetaminophen (TYLENOL) 500  MG tablet   Oral   Take 500 mg by mouth every 6 (six) hours as needed for pain.         Marland Kitchen buPROPion (WELLBUTRIN SR) 150 MG 12 hr tablet   Oral   Take 150 mg by mouth 2 (two) times daily.         . Calcium Carbonate-Vitamin D (CALCIUM + D PO)   Oral   Take 1 tablet by mouth daily.         Marland Kitchen levothyroxine (SYNTHROID, LEVOTHROID) 25 MCG tablet   Oral   Take 25 mcg by mouth daily.         Marland Kitchen losartan-hydrochlorothiazide (HYZAAR) 100-25 MG per tablet   Oral   Take 1 tablet by mouth daily.         . Multiple Vitamins-Minerals (CENTRUM PO)   Oral   Take 1 tablet by mouth daily.         . potassium gluconate 595 MG TABS   Oral   Take 595 mg by mouth daily.         . simvastatin (ZOCOR) 40 MG tablet   Oral   Take 40 mg by  mouth every evening.         . vitamin B-12 (CYANOCOBALAMIN) 100 MCG tablet   Oral   Take 50 mcg by mouth daily.         . Ibuprofen & Diet Manage Prod (IBUPROFEN-NUTRITIONAL SUPL) 600 MG MISC   Oral   Take 600 mg by mouth as needed.           BP 143/58  Pulse 54  Temp(Src) 98.1 F (36.7 C) (Oral)  Resp 18  SpO2 99%  Physical Exam  Constitutional: She is oriented to person, place, and time. She appears well-developed and well-nourished.  HENT:  Head: Normocephalic.  Mouth/Throat: Oropharynx is clear and moist.  Neck: Normal range of motion. Neck supple.  Cardiovascular: Normal rate and regular rhythm.   Pulmonary/Chest: Effort normal and breath sounds normal.  Abdominal: Soft. Bowel sounds are normal. There is no tenderness. There is no rebound and no guarding.  Musculoskeletal: Normal range of motion.  Neurological: She is alert and oriented to person, place, and time. She exhibits normal muscle tone. Coordination normal.  She is ambulatory without imbalance. Cranial nerves 3-12 grossly intact.   Skin: Skin is warm and dry. No rash noted.  Psychiatric: She has a normal mood and affect.    ED Course  Procedures (including critical care time)  Labs Reviewed  CBC WITH DIFFERENTIAL - Abnormal; Notable for the following:    RBC 5.41 (*)    Hemoglobin 15.6 (*)    All other components within normal limits  COMPREHENSIVE METABOLIC PANEL - Abnormal; Notable for the following:    GFR calc non Af Amer 53 (*)    GFR calc Af Amer 61 (*)    All other components within normal limits  URINALYSIS, MICROSCOPIC ONLY - Abnormal; Notable for the following:    APPearance CLOUDY (*)    Specific Gravity, Urine 1.031 (*)    Ketones, ur 15 (*)    All other components within normal limits  LIPASE, BLOOD   Results for orders placed during the hospital encounter of 04/08/12  CBC WITH DIFFERENTIAL      Result Value Range   WBC 4.8  4.0 - 10.5 K/uL   RBC 5.41 (*) 3.87 - 5.11 MIL/uL    Hemoglobin 15.6 (*) 12.0 - 15.0 g/dL   HCT  45.0  36.0 - 46.0 %   MCV 83.2  78.0 - 100.0 fL   MCH 28.8  26.0 - 34.0 pg   MCHC 34.7  30.0 - 36.0 g/dL   RDW 16.1  09.6 - 04.5 %   Platelets 231  150 - 400 K/uL   Neutrophils Relative 62  43 - 77 %   Neutro Abs 3.0  1.7 - 7.7 K/uL   Lymphocytes Relative 31  12 - 46 %   Lymphs Abs 1.5  0.7 - 4.0 K/uL   Monocytes Relative 7  3 - 12 %   Monocytes Absolute 0.3  0.1 - 1.0 K/uL   Eosinophils Relative 0  0 - 5 %   Eosinophils Absolute 0.0  0.0 - 0.7 K/uL   Basophils Relative 0  0 - 1 %   Basophils Absolute 0.0  0.0 - 0.1 K/uL  COMPREHENSIVE METABOLIC PANEL      Result Value Range   Sodium 140  135 - 145 mEq/L   Potassium 3.5  3.5 - 5.1 mEq/L   Chloride 101  96 - 112 mEq/L   CO2 27  19 - 32 mEq/L   Glucose, Bld 87  70 - 99 mg/dL   BUN 22  6 - 23 mg/dL   Creatinine, Ser 4.09  0.50 - 1.10 mg/dL   Calcium 9.7  8.4 - 81.1 mg/dL   Total Protein 7.3  6.0 - 8.3 g/dL   Albumin 3.7  3.5 - 5.2 g/dL   AST 29  0 - 37 U/L   ALT 26  0 - 35 U/L   Alkaline Phosphatase 82  39 - 117 U/L   Total Bilirubin 0.4  0.3 - 1.2 mg/dL   GFR calc non Af Amer 53 (*) >90 mL/min   GFR calc Af Amer 61 (*) >90 mL/min  LIPASE, BLOOD      Result Value Range   Lipase 33  11 - 59 U/L  URINALYSIS, MICROSCOPIC ONLY      Result Value Range   Color, Urine YELLOW  YELLOW   APPearance CLOUDY (*) CLEAR   Specific Gravity, Urine 1.031 (*) 1.005 - 1.030   pH 5.0  5.0 - 8.0   Glucose, UA NEGATIVE  NEGATIVE mg/dL   Hgb urine dipstick NEGATIVE  NEGATIVE   Bilirubin Urine NEGATIVE  NEGATIVE   Ketones, ur 15 (*) NEGATIVE mg/dL   Protein, ur NEGATIVE  NEGATIVE mg/dL   Urobilinogen, UA 0.2  0.0 - 1.0 mg/dL   Nitrite NEGATIVE  NEGATIVE   Leukocytes, UA NEGATIVE  NEGATIVE   WBC, UA 0-2  <3 WBC/hpf   Bacteria, UA RARE  RARE   Squamous Epithelial / LPF RARE  RARE   Urine-Other RARE YEAST      Ct Head Wo Contrast  04/08/2012  *RADIOLOGY REPORT*  Clinical Data: Nausea, abdominal  pain.  CT HEAD WITHOUT CONTRAST  Technique:  Contiguous axial images were obtained from the base of the skull through the vertex without contrast.  Comparison: 08/28/2011  Findings: No acute intracranial abnormality.  Specifically, no hemorrhage, hydrocephalus, mass lesion, acute infarction, or significant intracranial injury.  No acute calvarial abnormality. Visualized paranasal sinuses and mastoids clear.  Orbital soft tissues unremarkable.  IMPRESSION: No acute intracranial abnormality.   Original Report Authenticated By: Charlett Nose, M.D.      No diagnosis found.  1. N, V, D 2. Headache, resolved  MDM  Headache is controlled with IV medications. She is given IV rehydration and is tolerating  PO fluids. No further vomiting in the ED, no diarrhea. Normal head CT. Do not feel headache is related to treated aneurysm. Feel she is stable for discharge.         Arnoldo Hooker, PA-C 04/08/12 1513

## 2012-04-08 NOTE — ED Provider Notes (Signed)
Medical screening examination/treatment/procedure(s) were performed by non-physician practitioner and as supervising physician I was immediately available for consultation/collaboration.  Raeford Razor, MD 04/08/12 [phone]

## 2012-04-08 NOTE — ED Notes (Signed)
Pt having nausea, abd pain, headache, diarrhea. Denies vomiting.

## 2012-08-01 ENCOUNTER — Other Ambulatory Visit (HOSPITAL_COMMUNITY): Payer: Self-pay | Admitting: Family Medicine

## 2012-08-01 DIAGNOSIS — Z1231 Encounter for screening mammogram for malignant neoplasm of breast: Secondary | ICD-10-CM

## 2012-09-03 ENCOUNTER — Ambulatory Visit (HOSPITAL_COMMUNITY): Payer: BC Managed Care – PPO

## 2012-09-03 ENCOUNTER — Other Ambulatory Visit (HOSPITAL_COMMUNITY): Payer: Self-pay | Admitting: Interventional Radiology

## 2012-09-05 ENCOUNTER — Ambulatory Visit (HOSPITAL_COMMUNITY)
Admission: RE | Admit: 2012-09-05 | Discharge: 2012-09-05 | Disposition: A | Discharge status: Home / Self Care | Payer: BC Managed Care – PPO | Source: Ambulatory Visit | Attending: Family Medicine | Admitting: Family Medicine

## 2012-09-05 DIAGNOSIS — Z1231 Encounter for screening mammogram for malignant neoplasm of breast: Secondary | ICD-10-CM | POA: Insufficient documentation

## 2012-09-27 ENCOUNTER — Other Ambulatory Visit: Payer: Self-pay | Admitting: Interventional Cardiology

## 2012-10-01 ENCOUNTER — Encounter (HOSPITAL_COMMUNITY): Payer: Self-pay | Admitting: Pharmacy Technician

## 2012-10-04 ENCOUNTER — Encounter (HOSPITAL_COMMUNITY)
Admission: RE | Disposition: A | Discharge status: Home / Self Care | Payer: Self-pay | Source: Ambulatory Visit | Attending: Interventional Cardiology

## 2012-10-04 ENCOUNTER — Ambulatory Visit (HOSPITAL_COMMUNITY)
Admission: RE | Admit: 2012-10-04 | Discharge: 2012-10-04 | Disposition: A | Discharge status: Home / Self Care | Payer: BC Managed Care – PPO | Source: Ambulatory Visit | Attending: Interventional Cardiology | Admitting: Interventional Cardiology

## 2012-10-04 DIAGNOSIS — Z8249 Family history of ischemic heart disease and other diseases of the circulatory system: Secondary | ICD-10-CM | POA: Insufficient documentation

## 2012-10-04 DIAGNOSIS — Z823 Family history of stroke: Secondary | ICD-10-CM | POA: Insufficient documentation

## 2012-10-04 DIAGNOSIS — I701 Atherosclerosis of renal artery: Secondary | ICD-10-CM | POA: Insufficient documentation

## 2012-10-04 DIAGNOSIS — E039 Hypothyroidism, unspecified: Secondary | ICD-10-CM | POA: Insufficient documentation

## 2012-10-04 DIAGNOSIS — M1710 Unilateral primary osteoarthritis, unspecified knee: Secondary | ICD-10-CM | POA: Insufficient documentation

## 2012-10-04 DIAGNOSIS — K589 Irritable bowel syndrome without diarrhea: Secondary | ICD-10-CM | POA: Insufficient documentation

## 2012-10-04 DIAGNOSIS — I10 Essential (primary) hypertension: Secondary | ICD-10-CM | POA: Insufficient documentation

## 2012-10-04 DIAGNOSIS — F17200 Nicotine dependence, unspecified, uncomplicated: Secondary | ICD-10-CM | POA: Insufficient documentation

## 2012-10-04 DIAGNOSIS — E7800 Pure hypercholesterolemia, unspecified: Secondary | ICD-10-CM | POA: Insufficient documentation

## 2012-10-04 DIAGNOSIS — Z79899 Other long term (current) drug therapy: Secondary | ICD-10-CM | POA: Insufficient documentation

## 2012-10-04 DIAGNOSIS — F329 Major depressive disorder, single episode, unspecified: Secondary | ICD-10-CM | POA: Insufficient documentation

## 2012-10-04 DIAGNOSIS — I714 Abdominal aortic aneurysm, without rupture, unspecified: Secondary | ICD-10-CM | POA: Insufficient documentation

## 2012-10-04 DIAGNOSIS — I70219 Atherosclerosis of native arteries of extremities with intermittent claudication, unspecified extremity: Secondary | ICD-10-CM | POA: Insufficient documentation

## 2012-10-04 DIAGNOSIS — K565 Intestinal adhesions [bands], unspecified as to partial versus complete obstruction: Secondary | ICD-10-CM | POA: Insufficient documentation

## 2012-10-04 DIAGNOSIS — Z7982 Long term (current) use of aspirin: Secondary | ICD-10-CM | POA: Insufficient documentation

## 2012-10-04 DIAGNOSIS — I708 Atherosclerosis of other arteries: Secondary | ICD-10-CM | POA: Insufficient documentation

## 2012-10-04 DIAGNOSIS — I7092 Chronic total occlusion of artery of the extremities: Secondary | ICD-10-CM | POA: Insufficient documentation

## 2012-10-04 DIAGNOSIS — F3289 Other specified depressive episodes: Secondary | ICD-10-CM | POA: Insufficient documentation

## 2012-10-04 HISTORY — PX: LOWER EXTREMITY ANGIOGRAM: SHX5955

## 2012-10-04 HISTORY — PX: LOWER EXTREMITY ANGIOGRAM: SHX5508

## 2012-10-04 HISTORY — PX: INSERTION OF ILIAC STENT: SHX6256

## 2012-10-04 LAB — POCT ACTIVATED CLOTTING TIME
Activated Clotting Time: 222 seconds
Activated Clotting Time: 222 seconds
Activated Clotting Time: 206 seconds
Activated Clotting Time: 181 seconds

## 2012-10-04 SURGERY — ANGIOGRAM, LOWER EXTREMITY
Anesthesia: LOCAL

## 2012-10-04 MED ORDER — CLOPIDOGREL BISULFATE 300 MG PO TABS
ORAL_TABLET | ORAL | Status: AC
  Filled 2012-10-04: qty 2

## 2012-10-04 MED ORDER — CLOPIDOGREL BISULFATE 75 MG PO TABS: 75.0000 mg | ORAL_TABLET | Freq: Every day | ORAL | Status: DC

## 2012-10-04 MED ORDER — MIDAZOLAM HCL 2 MG/2ML IJ SOLN
INTRAMUSCULAR | Status: AC
  Filled 2012-10-04: qty 2

## 2012-10-04 MED ORDER — MORPHINE SULFATE 2 MG/ML IJ SOLN: 1.0000 mg | INTRAMUSCULAR | Status: DC | PRN

## 2012-10-04 MED ORDER — SODIUM CHLORIDE 0.9 % IV SOLN: 250.0000 mL | INTRAVENOUS | Status: DC | PRN

## 2012-10-04 MED ORDER — ASPIRIN 81 MG PO CHEW
CHEWABLE_TABLET | ORAL | Status: AC
  Filled 2012-10-04: qty 4

## 2012-10-04 MED ORDER — FENTANYL CITRATE 0.05 MG/ML IJ SOLN
INTRAMUSCULAR | Status: AC
  Filled 2012-10-04: qty 2

## 2012-10-04 MED ORDER — SODIUM CHLORIDE 0.9 % IJ SOLN: 3.0000 mL | INTRAMUSCULAR | Status: DC | PRN

## 2012-10-04 MED ORDER — DIAZEPAM 5 MG PO TABS
ORAL_TABLET | ORAL | Status: AC
  Filled 2012-10-04: qty 1

## 2012-10-04 MED ORDER — ONDANSETRON HCL 4 MG/2ML IJ SOLN: 4.0000 mg | Freq: Four times a day (QID) | INTRAMUSCULAR | Status: DC | PRN

## 2012-10-04 MED ORDER — HEPARIN SODIUM (PORCINE) 1000 UNIT/ML IJ SOLN
INTRAMUSCULAR | Status: AC
  Filled 2012-10-04: qty 1

## 2012-10-04 MED ORDER — SODIUM CHLORIDE 0.9 % IV SOLN
INTRAVENOUS | Status: DC
  Administered 2012-10-04: 10:00:00 via INTRAVENOUS

## 2012-10-04 MED ORDER — SODIUM CHLORIDE 0.9 % IV SOLN: INTRAVENOUS | Status: AC

## 2012-10-04 MED ORDER — SODIUM CHLORIDE 0.9 % IJ SOLN: 3.0000 mL | Freq: Two times a day (BID) | INTRAMUSCULAR | Status: DC

## 2012-10-04 MED ORDER — LIDOCAINE HCL (PF) 1 % IJ SOLN
INTRAMUSCULAR | Status: AC
  Filled 2012-10-04: qty 30

## 2012-10-04 MED ORDER — DIAZEPAM 5 MG PO TABS
5.0000 mg | ORAL_TABLET | ORAL | Status: AC
  Administered 2012-10-04: 5 mg via ORAL

## 2012-10-04 MED ORDER — ASPIRIN 81 MG PO CHEW
324.0000 mg | CHEWABLE_TABLET | ORAL | Status: AC
  Administered 2012-10-04: 324 mg via ORAL

## 2012-10-04 NOTE — Progress Notes (Signed)
Pressure held to right groin x 30 minutes, hematoma resolved. New dressing placed on right groin.

## 2012-10-04 NOTE — H&P (Signed)
  Date of Initial H&P: 09/14/12  History reviewed, patient examined, no change in status, stable for surgery.

## 2012-10-04 NOTE — Progress Notes (Signed)
Pt c/o pain in right groin area after coming of bedpan. Approximately 6 cm hematoma noted laterally to puncture site. Pressure held to right groin. Dr. Katrinka Blazing informed of pt status; order received to extend bedrest for an additional hour. Pt informed. Reinforced bedrest restrictions to pt; keep head down on pillow and to put pressure on right groin if she has to laugh/cough/sneeze.

## 2012-10-04 NOTE — CV Procedure (Signed)
PROCEDURE:  Abdominal aortogram, pelvic angiogram, bilateral lower extremity runoff; left iliac stent  INDICATIONS:  Claudication.  The risks, benefits, and details of the procedure were explained to the patient.  The patient verbalized understanding and wanted to proceed.  Informed written consent was obtained.  PROCEDURE TECHNIQUE:  After Xylocaine anesthesia a 75F sheath was placed in the right femoral artery with a single anterior needle wall stick.  A pigtail catheter was used for the abdominal aortogram and pelvic angiogram.  A power injection of contrast was used to perform the bilateral lower extremity runoff through the pigtail catheter.  A 5 French sheath was then changed out for a 6 [place] Ansell sheath.  Heparin was used for anticoagulation.  ACT was used to check that heparin was therapeutic. Manual compression was used for hemostasis.    CONTRAST:  Total of 125 cc.  COMPLICATIONS:  None.     ANGIOGRAPHIC DATA:   Abdominal aortogram reveals mild, infrarenal atherosclerosis.  There is an abdominal aortic aneurysm just above the aortoiliac bifurcation.  There is a single left renal artery which appears widely patent.  The right kidney has dual arterial supply but appears patent.  Right leg: Right common iliac, internal iliac and external iliac arteries are widely patent.  There is mild plaque noted at the origin of the internal iliac artery.  The right profunda femoral artery is patent.  The right common femoral artery is patent.  The right SFA has mild, diffuse atherosclerosis in the proximal to midportion.  The right distal SFA is widely patent.  The right popliteal artery is widely patent.  Below the knee, there is mild disease in the proximal anterior tibial.  The tibioperoneal trunk is widely patent.  There is three-vessel runoff below the right knee.  Left leg: The left common iliac artery has mild atherosclerosis proximally.  The left internal iliac artery is occluded.  That  territory is filled by collaterals.  In the mid external iliac artery, there is diffuse moderate atherosclerosis.  There is a focal 80% lesion above the pelvic brim.  The left common femoral artery is widely patent.  The left profunda femoral artery is patent.  The left SFA has mild to moderate atherosclerosis in the midportion.  The remainder of the vessel is widely patent.  The left popliteal artery is widely patent.  There is three-vessel runoff below the left knee.  INTERVENTIONAL NARRATIVE:  A 6 French sheath from the right common femoral artery was advanced over a versa core wire.  An IMA catheter was used to gain access to the left iliac system.  The mid left iliac was direct stented with a 8 x 60 mm self-expanding stent.  The stent was postdilated with a 7 x 60 balloon, inflated to 10 atmospheres.  There is an excellent angiographic result.  The patient did have discomfort with postdilatation.  IMPRESSIONS:  1. Small, infrarenal abdominal aortic aneurysm.  No renal artery stenosis. 2.  Focal 80% left external iliac lesion, successfully stented with an 8 x 60 mm self-expanding stent, postdilated to 7.4 millimeters in diameter.  RECOMMENDATION:  Aspirin and Plavix for at least a month.  She'll need to continue aggressive secondary prevention including lipid-lowering therapy, blood pressure control, blood sugar control and avoiding tobacco.  I stressed the importance of dual antiplatelet therapy for stent patency.

## 2013-01-16 ENCOUNTER — Other Ambulatory Visit: Payer: Self-pay | Admitting: Family Medicine

## 2013-01-16 ENCOUNTER — Other Ambulatory Visit (HOSPITAL_COMMUNITY)
Admission: RE | Admit: 2013-01-16 | Discharge: 2013-01-16 | Disposition: A | Discharge status: Home / Self Care | Payer: BC Managed Care – PPO | Source: Ambulatory Visit | Attending: Family Medicine | Admitting: Family Medicine

## 2013-01-16 DIAGNOSIS — Z0000 Encounter for general adult medical examination without abnormal findings: Secondary | ICD-10-CM | POA: Insufficient documentation

## 2013-02-14 ENCOUNTER — Other Ambulatory Visit (HOSPITAL_COMMUNITY): Payer: 59

## 2013-02-14 ENCOUNTER — Ambulatory Visit: Payer: 59 | Admitting: Family

## 2013-02-22 ENCOUNTER — Encounter: Payer: Self-pay | Admitting: Family

## 2013-02-25 ENCOUNTER — Ambulatory Visit (HOSPITAL_COMMUNITY)
Admission: RE | Admit: 2013-02-25 | Discharge: 2013-02-25 | Disposition: A | Discharge status: Home / Self Care | Payer: BC Managed Care – PPO | Source: Ambulatory Visit | Attending: Family | Admitting: Family

## 2013-02-25 ENCOUNTER — Encounter (INDEPENDENT_AMBULATORY_CARE_PROVIDER_SITE_OTHER): Payer: Self-pay

## 2013-02-25 ENCOUNTER — Encounter: Payer: Self-pay | Admitting: Family

## 2013-02-25 ENCOUNTER — Ambulatory Visit (INDEPENDENT_AMBULATORY_CARE_PROVIDER_SITE_OTHER): Payer: BC Managed Care – PPO | Admitting: Family

## 2013-02-25 VITALS — BP 165/78 | HR 61 | Resp 14 | Ht 62.0 in | Wt 156.0 lb

## 2013-02-25 DIAGNOSIS — I6529 Occlusion and stenosis of unspecified carotid artery: Secondary | ICD-10-CM

## 2013-02-25 NOTE — Progress Notes (Signed)
Established Carotid Patient   History of Present Illness  Vicki Page is a 63 y.o. female patient of Dr. Trula Slade followed for mild bilateral carotid stenosis.   She returns today for follow up. 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 Estanislado Pandy in 2012. She had a left leg arterial stent placed August, 2014 by Dr, Irish Lack; pt states her left leg symptoms have improved. Patient has not had previous extracranial carotid artery intervention. Pt denies ever having a stroke or TIA symptoms. She attributes her hypertension to family stress, states she saw her PCP recently. Pt denies claudication symptoms, denies non-healing wounds. Gaining waist girth since retirement.  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  Pt denies New Medical or Surgical History: any other new medical or surgical issues.  Pt Diabetic: No Pt smoker: smoker  (6-9 cigarettes/day, smoking x 30 years)  Pt meds include: Statin : Yes ASA: Yes Other anticoagulants/antiplatelets: stopped the Plavix at the direction of Dr.'s Deveshwar and [place]   Past Medical History  Diagnosis Date  . Hypertension   . Aneurysm   . Hyperthyroidism   . Hyperlipidemia   . Carotid artery occlusion     Social History History  Substance Use Topics  . Smoking status: Never Smoker   . Smokeless tobacco: Never Used  . Alcohol Use: No    Family History Family History  Problem Relation Age of Onset  . Deep vein thrombosis Mother   . Diabetes Mother     Amputation  . Heart disease Mother     Heart Disease before age 80  . Hyperlipidemia Mother   . Hypertension Mother   . Heart attack Mother   . Stroke Mother   . Heart disease Father     Heart Disease before age 75  . Hypertension Father   . Heart attack Father   . Hyperlipidemia Father   . Diabetes Brother   . Heart disease Brother     Heart Disease before age 33  . Hyperlipidemia Brother   .  Hypertension Brother   . Heart attack Brother   . COPD Brother   . Diabetes Brother   . Heart disease Brother     Heart Disease before age 23    Surgical History Past Surgical History  Procedure Laterality Date  . Brain surgery      Per patient " Left side behind eyes  . Aneurysm coiling  July 26, 2010    stent  . Lower extremity angiogram Left 10-04-12    and Left iliac stent    Allergies  Allergen Reactions  . Tylenol [Acetaminophen] Nausea Only  . Darvon [Propoxyphene Hcl] Nausea And Vomiting  . Flagyl [Metronidazole] Nausea And Vomiting    Current Outpatient Prescriptions  Medication Sig Dispense Refill  . acetaminophen (TYLENOL) 500 MG tablet Take 1,000 mg by mouth 2 (two) times daily as needed for pain.       Marland Kitchen aspirin EC 81 MG tablet Take 81 mg by mouth daily.      Marland Kitchen buPROPion (WELLBUTRIN SR) 150 MG 12 hr tablet Take 150 mg by mouth 2 (two) times daily.      . Calcium-Vitamin D (CALTRATE 600 PLUS-VIT D PO) Take by mouth.      . clopidogrel (PLAVIX) 75 MG tablet Take 1 tablet (75 mg total) by mouth daily with breakfast.  30 tablet  2  . levothyroxine (SYNTHROID, LEVOTHROID) 25  MCG tablet Take 25 mcg by mouth daily before breakfast.       . losartan-hydrochlorothiazide (HYZAAR) 100-25 MG per tablet Take 1 tablet by mouth daily.      . Multiple Vitamin (MULTIVITAMIN WITH MINERALS) TABS tablet Take 1 tablet by mouth daily. Centrum Silver      . neomycin-bacitracin-polymyxin (NEOSPORIN) ointment Apply 1 application topically daily as needed (for wound care or cutical care).      . potassium gluconate 595 MG TABS Take 595 mg by mouth daily.      Marland Kitchen pyridOXINE (VITAMIN B-6) 100 MG tablet Take 100 mg by mouth daily.      . simvastatin (ZOCOR) 40 MG tablet Take 40 mg by mouth daily with supper.       . Tetrahydrozoline HCl (VISINE OP) Place 1 drop into both eyes daily as needed (dry eyes).      Marland Kitchen tiZANidine (ZANAFLEX) 2 MG tablet Take 2 mg by mouth at bedtime as needed (muscel  spasms).      . traMADol (ULTRAM) 50 MG tablet Take 50 mg by mouth at bedtime as needed for pain.      . vitamin B-12 (CYANOCOBALAMIN) 100 MCG tablet Take 100 mcg by mouth daily.        No current facility-administered medications for this visit.    Review of Systems : See HPI for pertinent positives and negatives.  Physical Examination  Filed Vitals:   02/25/13 1520  BP: 165/78  Pulse: 61  Resp: 14   Filed Weights   02/25/13 1520  Weight: 156 lb (70.761 kg)   Body mass index is 28.53 kg/(m^2).  General: WDWN female in NAD GAIT: normal Eyes: PERRLA Pulmonary:  CTAB, Negative  Rales, Negative rhonchi, & Negative wheezing.  Cardiac: regular Rhythm ,  No detected Murmurs.  VASCULAR EXAM Carotid Bruits Left Right   Negative Negative    Aorta is not palpable. Radial pulses are 2+ palpable and equal.                                                                                                                            LE Pulses LEFT RIGHT       POPLITEAL   palpable    palpable       POSTERIOR TIBIAL  faintly palpable   faintly palpable        DORSALIS PEDIS      ANTERIOR TIBIAL  palpable   palpable     Gastrointestinal: soft, nontender, BS WNL, no r/g,  negative masses.  Musculoskeletal: Negative muscle atrophy/wasting. M/S 5/5 throughout in UE's, 4/5 in LE's, Extremities without ischemic changes.  Neurologic: A&O X 3; Appropriate Affect ; SENSATION ;normal;  Speech is normal CN 2-12 intact, Pain and light touch intact in extremities, Motor exam as listed above.  Non-Invasive Vascular Imaging CAROTID DUPLEX 02/25/2013   Right ICA: patent, no stenosis. Left ICA: <40% stenosis.  Previous carotid studies demonstrated: RICA <35% stenosis, LICA <00%  stenosis.  These findings are slightly Improved from previous exam.  Assessment: Vicki Page is a 63 y.o. female who presents with asymptomatic patent right ICA and minimal stenosis in left ICA. The left ICA  stenosis is  Improved from previous exam.  Plan: Follow-up in 1 year with Carotid Duplex scan.   I discussed in depth with the patient the nature of atherosclerosis, and emphasized the importance of maximal medical management including strict control of blood pressure, blood glucose, and lipid levels, obtaining regular exercise, and cessation of smoking.  The patient is aware that without maximal medical management the underlying atherosclerotic disease process will progress, limiting the benefit of any interventions.  Pt counseled re smoking cessation. The patient was given information about stroke prevention and what symptoms should prompt the patient to seek immediate medical care. Thank you for allowing [place] to participate in this patient's care.  Clemon Chambers, RN, MSN, FNP-C [facility] Office: [facility] Physician: Trula Slade  02/25/2013 3:29 PM

## 2013-02-25 NOTE — Patient Instructions (Addendum)
Stroke Prevention Some medical conditions and behaviors are associated with an increased chance of having a stroke. You may prevent a stroke by making healthy choices and managing medical conditions. HOW CAN I REDUCE MY RISK OF HAVING A STROKE?   Stay physically active. Get at least 30 minutes of activity on most or all days.  Do not smoke. It may also be helpful to avoid exposure to secondhand smoke.  Limit alcohol use. Moderate alcohol use is considered to be:  No more than 2 drinks per day for men.  No more than 1 drink per day for nonpregnant women.  Eat healthy foods. This involves  Eating 5 or more servings of fruits and vegetables a day.  Following a diet that addresses high blood pressure (hypertension), high cholesterol, diabetes, or obesity.  Manage your cholesterol levels.  A diet low in saturated fat, trans fat, and cholesterol and high in fiber may control cholesterol levels.  Take any prescribed medicines to control cholesterol as directed by your health care provider.  Manage your diabetes.  A controlled-carbohydrate, controlled-sugar diet is recommended to manage diabetes.  Take any prescribed medicines to control diabetes as directed by your health care provider.  Control your hypertension.  A low-salt (sodium), low-saturated fat, low-trans fat, and low-cholesterol diet is recommended to manage hypertension.  Take any prescribed medicines to control hypertension as directed by your health care provider.  Maintain a healthy weight.  A reduced-calorie, low-sodium, low-saturated fat, low-trans fat, low-cholesterol diet is recommended to manage weight.  Stop drug abuse.  Avoid taking birth control pills.  Talk to your health care provider about the risks of taking birth control pills if you are over 80 years old, smoke, get migraines, or have ever had a blood clot.  Get evaluated for sleep disorders (sleep apnea).  Talk to your health care provider about  getting a sleep evaluation if you snore a lot or have excessive sleepiness.  Take medicines as directed by your health care provider.  For some people, aspirin or blood thinners (anticoagulants) are helpful in reducing the risk of forming abnormal blood clots that can lead to stroke. If you have the irregular heart rhythm of atrial fibrillation, you should be on a blood thinner unless there is a good reason you cannot take them.  Understand all your medicine instructions.  Make sure that other other conditions (such as anemia or atherosclerosis) are addressed. SEEK IMMEDIATE MEDICAL CARE IF:   You have sudden weakness or numbness of the face, arm, or leg, especially on one side of the body.  Your face or eyelid droops to one side.  You have sudden confusion.  You have trouble speaking (aphasia) or understanding.  You have sudden trouble seeing in one or both eyes.  You have sudden trouble walking.  You have dizziness.  You have a loss of balance or coordination.  You have a sudden, severe headache with no known cause.  You have new chest pain or an irregular heartbeat. Any of these symptoms may represent a serious problem that is an emergency. Do not wait to see if the symptoms will go away. Get medical help at once. Call your local emergency services  (911 in [place] Do not drive yourself to the hospital. Document Released: 03/03/2004 Document Revised: 11/14/2012 Document Reviewed: 07/27/2012 [facility] Patient Information 2014 [place].  Smoking Cessation Quitting smoking is important to your health and has many advantages. However, it is not always easy to quit since nicotine is a very  addictive drug. Often times, people try 3 times or more before being able to quit. This document explains the best ways for you to prepare to quit smoking. Quitting takes hard work and a lot of effort, but you can do it. ADVANTAGES OF QUITTING SMOKING You will live longer, feel better, and  live better. Your body will feel the impact of quitting smoking almost immediately. Within 20 minutes, blood pressure decreases. Your pulse returns to its normal level. After 8 hours, carbon monoxide levels in the blood return to normal. Your oxygen level increases. After 24 hours, the chance of having a heart attack starts to decrease. Your breath, hair, and body stop smelling like smoke. After 48 hours, damaged nerve endings begin to recover. Your sense of taste and smell improve. After 72 hours, the body is virtually free of nicotine. Your bronchial tubes relax and breathing becomes easier. After 2 to 12 weeks, lungs can hold more air. Exercise becomes easier and circulation improves. The risk of having a heart attack, stroke, cancer, or lung disease is greatly reduced. After 1 year, the risk of coronary heart disease is cut in half. After 5 years, the risk of stroke falls to the same as a nonsmoker. After 10 years, the risk of lung cancer is cut in half and the risk of other cancers decreases significantly. After 15 years, the risk of coronary heart disease drops, usually to the level of a nonsmoker. If you are pregnant, quitting smoking will improve your chances of having a healthy baby. The people you live with, especially any children, will be healthier. You will have extra money to spend on things other than cigarettes. QUESTIONS TO THINK ABOUT BEFORE ATTEMPTING TO QUIT You may want to talk about your answers with your caregiver. Why do you want to quit? If you tried to quit in the past, what helped and what did not? What will be the most difficult situations for you after you quit? How will you plan to handle them? Who can help you through the tough times? Your family? Friends? A caregiver? What pleasures do you get from smoking? What ways can you still get pleasure if you quit? Here are some questions to ask your caregiver: How can you help me to be successful at quitting? What  medicine do you think would be best for me and how should I take it? What should I do if I need more help? What is smoking withdrawal like? How can I get information on withdrawal? GET READY Set a quit date. Change your environment by getting rid of all cigarettes, ashtrays, matches, and lighters in your home, car, or work. Do not let people smoke in your home. Review your past attempts to quit. Think about what worked and what did not. GET SUPPORT AND ENCOURAGEMENT You have a better chance of being successful if you have help. You can get support in many ways. Tell your family, friends, and co-workers that you are going to quit and need their support. Ask them not to smoke around you. Get individual, group, or telephone counseling and support. Programs are available at [employer] and health centers. Call your local health department for information about programs in your area. Spiritual beliefs and practices may help some smokers quit. Download a "quit meter" on your computer to keep track of quit statistics, such as how long you have gone without smoking, cigarettes not smoked, and money saved. Get a self-help book about quitting smoking and staying off of tobacco.  LEARN NEW SKILLS AND BEHAVIORS Distract yourself from urges to smoke. Talk to someone, go for a walk, or occupy your time with a task. Change your normal routine. Take a different route to work. Drink tea instead of coffee. Eat breakfast in a different place. Reduce your stress. Take a hot bath, exercise, or read a book. Plan something enjoyable to do every day. Reward yourself for not smoking. Explore interactive web-based programs that specialize in helping you quit. GET MEDICINE AND USE IT CORRECTLY Medicines can help you stop smoking and decrease the urge to smoke. Combining medicine with the above behavioral methods and support can greatly increase your chances of successfully quitting smoking. Nicotine replacement  therapy helps deliver nicotine to your body without the negative effects and risks of smoking. Nicotine replacement therapy includes nicotine gum, lozenges, inhalers, nasal sprays, and skin patches. Some may be available over-the-counter and others require a prescription. Antidepressant medicine helps people abstain from smoking, but how this works is unknown. This medicine is available by prescription. Nicotinic receptor partial agonist medicine simulates the effect of nicotine in your brain. This medicine is available by prescription. Ask your caregiver for advice about which medicines to use and how to use them based on your health history. Your caregiver will tell you what side effects to look out for if you choose to be on a medicine or therapy. Carefully read the information on the package. Do not use any other product containing nicotine while using a nicotine replacement product.  RELAPSE OR DIFFICULT SITUATIONS Most relapses occur within the first 3 months after quitting. Do not be discouraged if you start smoking again. Remember, most people try several times before finally quitting. You may have symptoms of withdrawal because your body is used to nicotine. You may crave cigarettes, be irritable, feel very hungry, cough often, get headaches, or have difficulty concentrating. The withdrawal symptoms are only temporary. They are strongest when you first quit, but they will go away within 10 14 days. To reduce the chances of relapse, try to: Avoid drinking alcohol. Drinking lowers your chances of successfully quitting. Reduce the amount of caffeine you consume. Once you quit smoking, the amount of caffeine in your body increases and can give you symptoms, such as a rapid heartbeat, sweating, and anxiety. Avoid smokers because they can make you want to smoke. Do not let weight gain distract you. Many smokers will gain weight when they quit, usually less than 10 pounds. Eat a healthy diet and stay  active. You can always lose the weight gained after you quit. Find ways to improve your mood other than smoking. FOR MORE INFORMATION  www.smokefree.gov  Document Released: 01/18/2001 Document Revised: 07/26/2011 Document Reviewed: 05/05/2011 [facility] Patient Information 2014 [place], [place].   Can try glucosamine sulfate to help lubricate joints, about 500 to 1000 mg twice daily, if you are not allergic to shellfish, iodine, or IV dye.

## 2013-08-21 ENCOUNTER — Other Ambulatory Visit (HOSPITAL_COMMUNITY): Payer: Self-pay | Admitting: Family Medicine

## 2013-08-21 DIAGNOSIS — Z1231 Encounter for screening mammogram for malignant neoplasm of breast: Secondary | ICD-10-CM

## 2013-09-10 ENCOUNTER — Ambulatory Visit (HOSPITAL_COMMUNITY)
Admission: RE | Admit: 2013-09-10 | Discharge: 2013-09-10 | Disposition: A | Discharge status: Home / Self Care | Payer: BC Managed Care – PPO | Source: Ambulatory Visit | Attending: Family Medicine | Admitting: Family Medicine

## 2013-09-10 DIAGNOSIS — Z1231 Encounter for screening mammogram for malignant neoplasm of breast: Secondary | ICD-10-CM | POA: Insufficient documentation

## 2014-01-03 ENCOUNTER — Encounter (HOSPITAL_COMMUNITY): Payer: Self-pay | Admitting: *Deleted

## 2014-01-03 ENCOUNTER — Inpatient Hospital Stay (HOSPITAL_COMMUNITY)
Admission: AD | Admit: 2014-01-03 | Discharge: 2014-01-03 | Disposition: A | Discharge status: Home / Self Care | Payer: BC Managed Care – PPO | Source: Ambulatory Visit | Attending: Obstetrics & Gynecology | Admitting: Obstetrics & Gynecology

## 2014-01-03 DIAGNOSIS — B373 Candidiasis of vulva and vagina: Secondary | ICD-10-CM | POA: Insufficient documentation

## 2014-01-03 DIAGNOSIS — Z720 Tobacco use: Secondary | ICD-10-CM | POA: Diagnosis not present

## 2014-01-03 DIAGNOSIS — F17200 Nicotine dependence, unspecified, uncomplicated: Secondary | ICD-10-CM | POA: Diagnosis present

## 2014-01-03 DIAGNOSIS — B3731 Acute candidiasis of vulva and vagina: Secondary | ICD-10-CM

## 2014-01-03 HISTORY — DX: Unspecified osteoarthritis, unspecified site: M19.90

## 2014-01-03 HISTORY — DX: Gastrojejunal ulcer, unspecified as acute or chronic, without hemorrhage or perforation: K28.9

## 2014-01-03 LAB — URINALYSIS, ROUTINE W REFLEX MICROSCOPIC
Bilirubin Urine: NEGATIVE
Glucose, UA: NEGATIVE mg/dL
Hgb urine dipstick: NEGATIVE
Ketones, ur: NEGATIVE mg/dL
Leukocytes, UA: NEGATIVE
Nitrite: NEGATIVE
Protein, ur: NEGATIVE mg/dL
Specific Gravity, Urine: 1.02 (ref 1.005–1.030)
Urobilinogen, UA: 0.2 mg/dL (ref 0.0–1.0)
pH: 7.5 (ref 5.0–8.0)

## 2014-01-03 LAB — WET PREP, GENITAL
Clue Cells Wet Prep HPF POC: NONE SEEN
Trich, Wet Prep: NONE SEEN
WBC, Wet Prep HPF POC: NONE SEEN

## 2014-01-03 MED ORDER — FLUCONAZOLE 150 MG PO TABS: 150.0000 mg | ORAL_TABLET | Freq: Every day | ORAL | Status: DC

## 2014-01-03 NOTE — MAU Note (Signed)
Sore in vaginal area, tried vagisil, not getting better.  Started with itching, now is sore.  No burning when urinates.  Pt is not sexually active (2004)

## 2014-01-03 NOTE — MAU Note (Signed)
Denies vaginal discharge but is c/o irritation and burning with wiping or washing; has not been sexually active for past 12 years;

## 2014-01-03 NOTE — Discharge Instructions (Signed)
Candidal Vulvovaginitis Candidal vulvovaginitis is an infection of the vagina and vulva. The vulva is the skin around the opening of the vagina. This may cause itching and discomfort in and around the vagina.  HOME CARE  Only take medicine as told by your doctor.  Do not have sex (intercourse) until the infection is healed or as told by your doctor.  Practice safe sex.  Tell your sex partner about your infection.  Do not douche or use tampons.  Wear cotton underwear. Do not wear tight pants or panty hose.  Eat yogurt. This may help treat and prevent yeast infections. GET HELP RIGHT AWAY IF:   You have a fever.  Your problems get worse during treatment or do not get better in 3 days.  You have discomfort, irritation, or itching in your vagina or vulva area.  You have pain after sex.  You start to get belly (abdominal) pain. MAKE SURE YOU:  Understand these instructions.  Will watch your condition.  Will get help right away if you are not doing well or get worse. Document Released: 04/22/2008 Document Revised: 01/29/2013 Document Reviewed: 04/22/2008 [employer] Patient Information 2015 [place], [place]. This information is not intended to replace advice given to you by your health care provider. Make sure you discuss any questions you have with your health care provider. Menopause Menopause is the normal time of life when menstrual periods stop completely. Menopause is complete when you have missed 12 consecutive menstrual periods. It usually occurs between the ages of 40 years and 74 years. Very rarely does a woman develop menopause before the age of 15 years. At menopause, your ovaries stop producing the female hormones estrogen and progesterone. This can cause undesirable symptoms and also affect your health. Sometimes the symptoms may occur 4-5 years before the menopause begins. There is no relationship between menopause and:  Oral contraceptives.  Number of children you  had.  Race.  The age your menstrual periods started (menarche). Heavy smokers and very thin women may develop menopause earlier in life. CAUSES  The ovaries stop producing the female hormones estrogen and progesterone.  Other causes include:  Surgery to remove both ovaries.  The ovaries stop functioning for no known reason.  Tumors of the pituitary gland in the brain.  Medical disease that affects the ovaries and hormone production.  Radiation treatment to the abdomen or pelvis.  Chemotherapy that affects the ovaries. SYMPTOMS   Hot flashes.  Night sweats.  Decrease in sex drive.  Vaginal dryness and thinning of the vagina causing painful intercourse.  Dryness of the skin and developing wrinkles.  Headaches.  Tiredness.  Irritability.  Memory problems.  Weight gain.  Bladder infections.  Hair growth of the face and chest.  Infertility. More serious symptoms include:  Loss of bone (osteoporosis) causing breaks (fractures).  Depression.  Hardening and narrowing of the arteries (atherosclerosis) causing heart attacks and strokes. DIAGNOSIS   When the menstrual periods have stopped for 12 straight months.  Physical exam.  Hormone studies of the blood. TREATMENT  There are many treatment choices and nearly as many questions about them. The decisions to treat or not to treat menopausal changes is an individual choice made with your health care provider. Your health care provider can discuss the treatments with you. Together, you can decide which treatment will work best for you. Your treatment choices may include:   Hormone therapy (estrogen and progesterone).  Non-hormonal medicines.  Treating the individual symptoms with medicine (for example antidepressants  for depression).  Herbal medicines that may help specific symptoms.  Counseling by a psychiatrist or psychologist.  Group therapy.  Lifestyle changes including:  Eating  healthy.  Regular exercise.  Limiting caffeine and alcohol.  Stress management and meditation.  No treatment. HOME CARE INSTRUCTIONS   Take the medicine your health care provider gives you as directed.  Get plenty of sleep and rest.  Exercise regularly.  Eat a diet that contains calcium (good for the bones) and soy products (acts like estrogen hormone).  Avoid alcoholic beverages.  Do not smoke.  If you have hot flashes, dress in layers.  Take supplements, calcium, and vitamin D to strengthen bones.  You can use over-the-counter lubricants or moisturizers for vaginal dryness.  Group therapy is sometimes very helpful.  Acupuncture may be helpful in some cases. SEEK MEDICAL CARE IF:   You are not sure you are in menopause.  You are having menopausal symptoms and need advice and treatment.  You are still having menstrual periods after age 1 years.  You have pain with intercourse.  Menopause is complete (no menstrual period for 12 months) and you develop vaginal bleeding.  You need a referral to a specialist (gynecologist, psychiatrist, or psychologist) for treatment. SEEK IMMEDIATE MEDICAL CARE IF:   You have severe depression.  You have excessive vaginal bleeding.  You fell and think you have a broken bone.  You have pain when you urinate.  You develop leg or chest pain.  You have a fast pounding heart beat (palpitations).  You have severe headaches.  You develop vision problems.  You feel a lump in your breast.  You have abdominal pain or severe indigestion. Document Released: 04/16/2003 Document Revised: 09/26/2012 Document Reviewed: 08/23/2012 [facility] Patient Information 2015 [place], [place]. This information is not intended to replace advice given to you by your health care provider. Make sure you discuss any questions you have with your health care provider.

## 2014-01-03 NOTE — MAU Provider Note (Signed)
History     CSN: [id number]  Arrival date and time: 01/03/14 1054   First Provider Initiated Contact with Patient 01/03/14 1136      Chief Complaint  Patient presents with  . Vaginal Pain   HPI Comments: Vicki Page 63 y.o. [id number] presents to MAU with vaginal issues for last 3-4 days. She had vaginal dryness and itching and put vasoline on her labia. This made things worse so she used Vagisil which did not help and may have made thigs worse. She has had this problem once before and was given what sounds like Diflucan, She is 10+ years postmenopausal and is not sexually active since 2004. She denies any urinary issues. She has cardiac issues and is a smoker.   Vaginal Pain      Past Medical History  Diagnosis Date  . Hypertension   . Aneurysm   . Hyperthyroidism   . Hyperlipidemia   . Carotid artery occlusion   . Ulcer of the stomach and intestine   . Arthritis     Past Surgical History  Procedure Laterality Date  . Brain surgery      Per patient " Left side behind eyes  . Aneurysm coiling  July 26, 2010    stent  . Lower extremity angiogram Left 10-04-12    and Left iliac stent  . Leg surgery    . Tubal ligation      Family History  Problem Relation Age of Onset  . Deep vein thrombosis Mother   . Diabetes Mother     Amputation  . Heart disease Mother     Heart Disease before age 27  . Hyperlipidemia Mother   . Hypertension Mother   . Heart attack Mother   . Stroke Mother   . Heart disease Father     Heart Disease before age 23  . Hypertension Father   . Heart attack Father   . Hyperlipidemia Father   . Diabetes Brother   . Heart disease Brother     Heart Disease before age 34  . Hyperlipidemia Brother   . Hypertension Brother   . Heart attack Brother   . COPD Brother   . Diabetes Brother   . Heart disease Brother     Heart Disease before age 33    History  Substance Use Topics  . Smoking status: Current Every Day Smoker -- 0.50 packs/day   . Smokeless tobacco: Never Used  . Alcohol Use: No    Allergies:  Allergies  Allergen Reactions  . Codeine Nausea And Vomiting  . Darvon [Propoxyphene Hcl] Nausea And Vomiting  . Flagyl [Metronidazole] Nausea And Vomiting    Prescriptions prior to admission  Medication Sig Dispense Refill Last Dose  . acetaminophen (TYLENOL) 500 MG tablet Take 1,000 mg by mouth 2 (two) times daily as needed for pain.    Past Week at Unknown time  . aspirin EC 81 MG tablet Take 81 mg by mouth daily.   01/02/2014 at Unknown time  . buPROPion (WELLBUTRIN SR) 150 MG 12 hr tablet Take 150 mg by mouth 2 (two) times daily.   01/02/2014 at Unknown time  . Calcium-Vitamin D (CALTRATE 600 PLUS-VIT D PO) Take by mouth.   Past Week at Unknown time  . Cholecalciferol (VITAMIN D) 2000 UNITS CAPS Take 4,000 Units by mouth daily.   01/02/2014 at Unknown time  . clopidogrel (PLAVIX) 75 MG tablet Take 1 tablet (75 mg total) by mouth daily with breakfast. 30 tablet 2  01/02/2014 at Unknown time  . levothyroxine (SYNTHROID, LEVOTHROID) 25 MCG tablet Take 25 mcg by mouth daily before breakfast.    01/03/2014 at Unknown time  . losartan-hydrochlorothiazide (HYZAAR) 100-25 MG per tablet Take 1 tablet by mouth daily.   01/03/2014 at Unknown time  . Multiple Vitamin (MULTIVITAMIN WITH MINERALS) TABS tablet Take 1 tablet by mouth daily. Centrum Silver   Past Week at Unknown time  . pantoprazole (PROTONIX) 40 MG tablet Take 40 mg by mouth daily.   01/02/2014 at Unknown time  . potassium gluconate 595 MG TABS Take 595 mg by mouth daily.   01/02/2014 at Unknown time  . pyridOXINE (VITAMIN B-6) 100 MG tablet Take 100 mg by mouth daily.   01/02/2014 at Unknown time  . simvastatin (ZOCOR) 40 MG tablet Take 40 mg by mouth daily with supper.    01/02/2014 at Unknown time  . sucralfate (CARAFATE) 1 G tablet Take 1 g by mouth 2 (two) times daily.   01/02/2014 at Unknown time  . Tetrahydrozoline HCl (VISINE OP) Place 1 drop into both eyes  daily as needed (dry eyes).   01/02/2014 at Unknown time  . tiZANidine (ZANAFLEX) 2 MG tablet Take 2 mg by mouth at bedtime as needed (muscel spasms).   Past Month at Unknown time  . traMADol (ULTRAM) 50 MG tablet Take 50 mg by mouth at bedtime as needed for pain.   Past Month at Unknown time  . vitamin B-12 (CYANOCOBALAMIN) 500 MCG tablet Take 500 mcg by mouth daily.   01/02/2014 at Unknown time  . Wheat Dextrin (BENEFIBER PO) Take 10 mLs by mouth daily.   01/02/2014 at Unknown time  . neomycin-bacitracin-polymyxin (NEOSPORIN) ointment Apply 1 application topically daily as needed (for wound care or cutical care).   Taking    Review of Systems  Constitutional: Negative.   HENT: Negative.   Eyes: Negative.   Respiratory: Negative.   Cardiovascular: Negative.   Gastrointestinal: Negative.   Genitourinary: Negative.        Vaginal itching and irritation  Musculoskeletal: Negative.   Skin: Negative.   Neurological: Negative.   Psychiatric/Behavioral: Negative.    Physical Exam   Blood pressure 149/69, pulse 73, temperature 98.2 F (36.8 C), temperature source Oral, resp. rate 16.  Physical Exam  Constitutional: She is oriented to person, place, and time. She appears well-developed and well-nourished. No distress.  HENT:  Head: Normocephalic and atraumatic.  Eyes: Pupils are equal, round, and reactive to light.  Cardiovascular: Normal rate, regular rhythm and normal heart sounds.   Respiratory: Effort normal and breath sounds normal. No respiratory distress.  GI: Soft. Bowel sounds are normal. She exhibits no distension. There is no tenderness. There is no rebound.  Abdominal scar/ old healed  Genitourinary:  Genital: External loss of architecture  Vaginal: Dry/ loss vaginal folds Cervix: no lesions Bimanual: nontender/ no mass appreciated    Musculoskeletal: Normal range of motion.  Neurological: She is alert and oriented to person, place, and time.  Skin: Skin is warm and  dry.  Psychiatric: She has a normal mood and affect. Her behavior is normal. Judgment and thought content normal.   Results for orders placed or performed during the hospital encounter of 01/03/14 (from the past 24 hour(s))  Urinalysis, Routine w reflex microscopic     Status: None   Collection Time: 01/03/14 11:05 AM  Result Value Ref Range   Color, Urine YELLOW YELLOW   APPearance CLEAR CLEAR   Specific Gravity, Urine 1.020 1.005 -  1.030   pH 7.5 5.0 - 8.0   Glucose, UA NEGATIVE NEGATIVE mg/dL   Hgb urine dipstick NEGATIVE NEGATIVE   Bilirubin Urine NEGATIVE NEGATIVE   Ketones, ur NEGATIVE NEGATIVE mg/dL   Protein, ur NEGATIVE NEGATIVE mg/dL   Urobilinogen, UA 0.2 0.0 - 1.0 mg/dL   Nitrite NEGATIVE NEGATIVE   Leukocytes, UA NEGATIVE NEGATIVE  Wet prep, genital     Status: Abnormal   Collection Time: 01/03/14 11:33 AM  Result Value Ref Range   Yeast Wet Prep HPF POC FEW (A) NONE SEEN   Trich, Wet Prep NONE SEEN NONE SEEN   Clue Cells Wet Prep HPF POC NONE SEEN NONE SEEN   WBC, Wet Prep HPF POC NONE SEEN NONE SEEN   . MAU Course  Procedures  MDM Wet prep/ urine  Assessment and Plan   A: Vaginal Yeast  P: Diflucan 150 mg  / one refill Advised to find GYN for theses issues Follow up MAU as needed  Georgia Duff 01/03/2014, 1:04 PM

## 2014-01-04 ENCOUNTER — Encounter: Payer: Self-pay | Admitting: *Deleted

## 2014-01-07 ENCOUNTER — Encounter: Payer: Self-pay | Admitting: Family

## 2014-01-16 ENCOUNTER — Encounter (HOSPITAL_COMMUNITY): Payer: Self-pay | Admitting: Interventional Cardiology

## 2014-02-26 ENCOUNTER — Encounter: Payer: Self-pay | Admitting: Family

## 2014-02-27 ENCOUNTER — Other Ambulatory Visit (HOSPITAL_COMMUNITY): Payer: BC Managed Care – PPO

## 2014-02-27 ENCOUNTER — Ambulatory Visit: Payer: BC Managed Care – PPO | Admitting: Family

## 2014-03-03 ENCOUNTER — Ambulatory Visit: Payer: BC Managed Care – PPO | Admitting: Family

## 2014-03-03 ENCOUNTER — Other Ambulatory Visit (HOSPITAL_COMMUNITY): Payer: BC Managed Care – PPO

## 2014-03-06 ENCOUNTER — Encounter: Payer: Self-pay | Admitting: Family

## 2014-03-07 ENCOUNTER — Ambulatory Visit
Admission: RE | Admit: 2014-03-07 | Discharge: 2014-03-07 | Disposition: A | Discharge status: Home / Self Care | Payer: BC Managed Care – PPO | Source: Ambulatory Visit | Attending: Family Medicine | Admitting: Family Medicine

## 2014-03-07 ENCOUNTER — Other Ambulatory Visit: Payer: Self-pay | Admitting: Family Medicine

## 2014-03-07 DIAGNOSIS — M25552 Pain in left hip: Secondary | ICD-10-CM

## 2014-03-07 DIAGNOSIS — M25562 Pain in left knee: Secondary | ICD-10-CM

## 2014-03-10 ENCOUNTER — Ambulatory Visit: Payer: BC Managed Care – PPO | Admitting: Family

## 2014-03-10 ENCOUNTER — Other Ambulatory Visit (HOSPITAL_COMMUNITY): Payer: BC Managed Care – PPO

## 2014-03-17 ENCOUNTER — Encounter: Payer: Self-pay | Admitting: Family

## 2014-03-19 ENCOUNTER — Ambulatory Visit: Payer: BC Managed Care – PPO | Admitting: Family

## 2014-03-19 ENCOUNTER — Other Ambulatory Visit (HOSPITAL_COMMUNITY): Payer: BC Managed Care – PPO

## 2014-03-21 ENCOUNTER — Encounter: Payer: Self-pay | Admitting: Family

## 2014-03-24 ENCOUNTER — Other Ambulatory Visit (HOSPITAL_COMMUNITY): Payer: BC Managed Care – PPO

## 2014-03-24 ENCOUNTER — Ambulatory Visit: Payer: BC Managed Care – PPO | Admitting: Family

## 2014-04-08 ENCOUNTER — Ambulatory Visit
Admission: RE | Admit: 2014-04-08 | Discharge: 2014-04-08 | Disposition: A | Discharge status: Home / Self Care | Payer: Self-pay | Source: Ambulatory Visit | Attending: Family Medicine | Admitting: Family Medicine

## 2014-04-08 ENCOUNTER — Other Ambulatory Visit: Payer: Self-pay | Admitting: Family Medicine

## 2014-04-08 DIAGNOSIS — W19XXXA Unspecified fall, initial encounter: Secondary | ICD-10-CM

## 2014-05-06 ENCOUNTER — Encounter: Payer: Self-pay | Admitting: Family

## 2014-05-07 ENCOUNTER — Inpatient Hospital Stay (HOSPITAL_COMMUNITY): Admission: RE | Admit: 2014-05-07 | Payer: BC Managed Care – PPO | Source: Ambulatory Visit

## 2014-05-07 ENCOUNTER — Ambulatory Visit: Payer: BC Managed Care – PPO | Admitting: Family

## 2014-05-29 ENCOUNTER — Encounter: Payer: Self-pay | Admitting: Family

## 2014-05-30 ENCOUNTER — Ambulatory Visit (HOSPITAL_COMMUNITY)
Admission: RE | Admit: 2014-05-30 | Discharge: 2014-05-30 | Disposition: A | Discharge status: Home / Self Care | Payer: BC Managed Care – PPO | Source: Ambulatory Visit | Attending: Family | Admitting: Family

## 2014-05-30 ENCOUNTER — Encounter (HOSPITAL_COMMUNITY): Payer: Self-pay | Admitting: Emergency Medicine

## 2014-05-30 ENCOUNTER — Ambulatory Visit (INDEPENDENT_AMBULATORY_CARE_PROVIDER_SITE_OTHER): Payer: BC Managed Care – PPO | Admitting: Family

## 2014-05-30 ENCOUNTER — Encounter: Payer: Self-pay | Admitting: Family

## 2014-05-30 ENCOUNTER — Emergency Department (HOSPITAL_COMMUNITY)
Admission: EM | Admit: 2014-05-30 | Discharge: 2014-05-31 | Disposition: A | Discharge status: Home / Self Care | Payer: BC Managed Care – PPO | Attending: Emergency Medicine | Admitting: Emergency Medicine

## 2014-05-30 VITALS — BP 174/85 | HR 67 | Resp 16 | Ht 62.5 in | Wt 162.0 lb

## 2014-05-30 DIAGNOSIS — S79912A Unspecified injury of left hip, initial encounter: Secondary | ICD-10-CM | POA: Insufficient documentation

## 2014-05-30 DIAGNOSIS — Z79899 Other long term (current) drug therapy: Secondary | ICD-10-CM | POA: Insufficient documentation

## 2014-05-30 DIAGNOSIS — Y92009 Unspecified place in unspecified non-institutional (private) residence as the place of occurrence of the external cause: Secondary | ICD-10-CM | POA: Diagnosis not present

## 2014-05-30 DIAGNOSIS — E785 Hyperlipidemia, unspecified: Secondary | ICD-10-CM | POA: Insufficient documentation

## 2014-05-30 DIAGNOSIS — M1990 Unspecified osteoarthritis, unspecified site: Secondary | ICD-10-CM | POA: Insufficient documentation

## 2014-05-30 DIAGNOSIS — Z86718 Personal history of other venous thrombosis and embolism: Secondary | ICD-10-CM | POA: Insufficient documentation

## 2014-05-30 DIAGNOSIS — Z720 Tobacco use: Secondary | ICD-10-CM | POA: Diagnosis not present

## 2014-05-30 DIAGNOSIS — F17200 Nicotine dependence, unspecified, uncomplicated: Secondary | ICD-10-CM | POA: Insufficient documentation

## 2014-05-30 DIAGNOSIS — S4992XA Unspecified injury of left shoulder and upper arm, initial encounter: Secondary | ICD-10-CM | POA: Insufficient documentation

## 2014-05-30 DIAGNOSIS — S4991XA Unspecified injury of right shoulder and upper arm, initial encounter: Secondary | ICD-10-CM | POA: Diagnosis present

## 2014-05-30 DIAGNOSIS — W182XXA Fall in (into) shower or empty bathtub, initial encounter: Secondary | ICD-10-CM | POA: Diagnosis not present

## 2014-05-30 DIAGNOSIS — S7012XA Contusion of left thigh, initial encounter: Secondary | ICD-10-CM | POA: Diagnosis not present

## 2014-05-30 DIAGNOSIS — Z7902 Long term (current) use of antithrombotics/antiplatelets: Secondary | ICD-10-CM | POA: Insufficient documentation

## 2014-05-30 DIAGNOSIS — S29092A Other injury of muscle and tendon of back wall of thorax, initial encounter: Secondary | ICD-10-CM | POA: Diagnosis not present

## 2014-05-30 DIAGNOSIS — S199XXA Unspecified injury of neck, initial encounter: Secondary | ICD-10-CM | POA: Insufficient documentation

## 2014-05-30 DIAGNOSIS — I6523 Occlusion and stenosis of bilateral carotid arteries: Secondary | ICD-10-CM

## 2014-05-30 DIAGNOSIS — Y9389 Activity, other specified: Secondary | ICD-10-CM | POA: Insufficient documentation

## 2014-05-30 DIAGNOSIS — I10 Essential (primary) hypertension: Secondary | ICD-10-CM | POA: Diagnosis not present

## 2014-05-30 DIAGNOSIS — Z8719 Personal history of other diseases of the digestive system: Secondary | ICD-10-CM | POA: Diagnosis not present

## 2014-05-30 DIAGNOSIS — W19XXXA Unspecified fall, initial encounter: Secondary | ICD-10-CM

## 2014-05-30 DIAGNOSIS — Y998 Other external cause status: Secondary | ICD-10-CM | POA: Insufficient documentation

## 2014-05-30 DIAGNOSIS — Z7982 Long term (current) use of aspirin: Secondary | ICD-10-CM | POA: Diagnosis not present

## 2014-05-30 HISTORY — DX: Unspecified place in unspecified non-institutional (private) residence as the place of occurrence of the external cause: W19.XXXA

## 2014-05-30 HISTORY — DX: Unspecified fall, initial encounter: Y92.009

## 2014-05-30 MED ORDER — IBUPROFEN 800 MG PO TABS
800.0000 mg | ORAL_TABLET | Freq: Once | ORAL | Status: AC
  Administered 2014-05-31: 800 mg via ORAL
  Filled 2014-05-30: qty 1

## 2014-05-30 NOTE — ED Notes (Signed)
Pt. slipped on the bathtub at home this afternoon , no LOC / ambulatory , reports pain at left hip , upper back and bilateral upper arms . Alert and oriented/ respirations unlabored.

## 2014-05-30 NOTE — Progress Notes (Signed)
Established Carotid Patient   History of Present Illness  Vicki Page is a 64 y.o. female patient of Dr. Trula Slade followed for mild bilateral carotid stenosis.  She returns today for follow up. 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 Estanislado Pandy in 2012. She had a left leg arterial stent placed August, 2014 by Dr, Irish Lack; pt states her left leg symptoms have improved. Patient has not had previous extracranial carotid artery intervention.  Pt denies ever having a stroke or TIA symptoms. She attributes her hypertension to family stress.  Pt denies claudication symptoms, denies non-healing wounds. Gaining waist girth since retirement.  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  She slipped and fell on her left leg in her shower today, denies hitting her head, left hip is a little sore.  Pt denies New Medical or Surgical History: any other new medical or surgical issues.  Pt Diabetic: No Pt smoker: smoker (6-9 cigarettes/day, smoking x 30 years, has quit several times)  Pt meds include: Statin : Yes ASA: Yes Other anticoagulants/antiplatelets: Plavix  Past Medical History  Diagnosis Date  . Hypertension   . Aneurysm   . Hyperthyroidism   . Hyperlipidemia   . Carotid artery occlusion   . Ulcer of the stomach and intestine   . Arthritis     Social History History  Substance Use Topics  . Smoking status: Current Every Day Smoker -- 0.50 packs/day  . Smokeless tobacco: Never Used  . Alcohol Use: No    Family History Family History  Problem Relation Age of Onset  . Deep vein thrombosis Mother   . Diabetes Mother     Amputation  . Heart disease Mother     Heart Disease before age 20  . Hyperlipidemia Mother   . Hypertension Mother   . Heart attack Mother   . Stroke Mother   . Heart disease Father     Heart Disease before age 29  . Hypertension Father   . Heart attack Father   .  Hyperlipidemia Father   . Diabetes Brother   . Heart disease Brother     Heart Disease before age 4  . Hyperlipidemia Brother   . Hypertension Brother   . Heart attack Brother   . COPD Brother   . Diabetes Brother   . Heart disease Brother     Heart Disease before age 46    Surgical History Past Surgical History  Procedure Laterality Date  . Brain surgery      Per patient " Left side behind eyes  . Aneurysm coiling  July 26, 2010    stent  . Lower extremity angiogram Left 10-04-12    and Left iliac stent  . Leg surgery    . Tubal ligation    . Lower extremity angiogram N/A 10/04/2012    Procedure: LOWER EXTREMITY ANGIOGRAM;  Surgeon: Jettie Booze, MD;  Location: [facility] CATH LAB;  Service: Cardiovascular;  Laterality: N/A;  . Insertion of iliac stent  10/04/2012    Procedure: INSERTION OF ILIAC STENT;  Surgeon: Jettie Booze, MD;  Location: [facility] CATH LAB;  Service: Cardiovascular;;  Left External Iliac Artery    Allergies  Allergen Reactions  . Codeine Nausea And Vomiting  . Darvon [Propoxyphene Hcl] Nausea And Vomiting  . Flagyl [Metronidazole] Nausea And Vomiting    Current Outpatient Prescriptions  Medication Sig Dispense Refill  . acetaminophen (TYLENOL) 500  MG tablet Take 1,000 mg by mouth 2 (two) times daily as needed for pain.     Marland Kitchen aspirin EC 81 MG tablet Take 81 mg by mouth daily.    Marland Kitchen buPROPion (WELLBUTRIN SR) 150 MG 12 hr tablet Take 150 mg by mouth 2 (two) times daily.    . Calcium-Vitamin D (CALTRATE 600 PLUS-VIT D PO) Take by mouth.    . Cholecalciferol (VITAMIN D) 2000 UNITS CAPS Take 4,000 Units by mouth daily.    . clopidogrel (PLAVIX) 75 MG tablet Take 1 tablet (75 mg total) by mouth daily with breakfast. 30 tablet 2  . fluconazole (DIFLUCAN) 150 MG tablet Take 1 tablet (150 mg total) by mouth daily. 1 tablet 1  . levothyroxine (SYNTHROID, LEVOTHROID) 25 MCG tablet Take 25 mcg by mouth daily before breakfast.     . losartan-hydrochlorothiazide  (HYZAAR) 100-25 MG per tablet Take 1 tablet by mouth daily.    . Multiple Vitamin (MULTIVITAMIN WITH MINERALS) TABS tablet Take 1 tablet by mouth daily. Centrum Silver    . neomycin-bacitracin-polymyxin (NEOSPORIN) ointment Apply 1 application topically daily as needed (for wound care or cutical care).    . pantoprazole (PROTONIX) 40 MG tablet Take 40 mg by mouth daily.    . potassium gluconate 595 MG TABS Take 595 mg by mouth daily.    Marland Kitchen pyridOXINE (VITAMIN B-6) 100 MG tablet Take 100 mg by mouth daily.    . simvastatin (ZOCOR) 40 MG tablet Take 40 mg by mouth daily with supper.     . sucralfate (CARAFATE) 1 G tablet Take 1 g by mouth 2 (two) times daily.    . Tetrahydrozoline HCl (VISINE OP) Place 1 drop into both eyes daily as needed (dry eyes).    Marland Kitchen tiZANidine (ZANAFLEX) 2 MG tablet Take 2 mg by mouth at bedtime as needed (muscel spasms).    . traMADol (ULTRAM) 50 MG tablet Take 50 mg by mouth at bedtime as needed for pain.    . vitamin B-12 (CYANOCOBALAMIN) 500 MCG tablet Take 500 mcg by mouth daily.    . Wheat Dextrin (BENEFIBER PO) Take 10 mLs by mouth daily.     No current facility-administered medications for this visit.    Review of Systems : See HPI for pertinent positives and negatives.  Physical Examination  Filed Vitals:   05/30/14 1334 05/30/14 1338 05/30/14 1347 05/30/14 1350  BP: 155/81 151/82 162/77 174/85  Pulse: 71 72 67 67  Resp:  16    Height:  5' 2.5" (1.588 m)    Weight:  162 lb (73.483 kg)    SpO2:  99%     Body mass index is 29.14 kg/(m^2).   General: WDWN female in NAD GAIT: normal Eyes: PERRLA Pulmonary: CTAB, Negative Rales, Negative rhonchi, & Negative wheezing.  Cardiac: regular Rhythm, no detected Murmurs.  VASCULAR EXAM Carotid Bruits Left Right   Negative Negative   Aorta is not palpable. Radial pulses are 2+ palpable and equal.       LE Pulses LEFT RIGHT   POPLITEAL  palpable   palpable   POSTERIOR TIBIAL faintly palpable  faintly palpable    DORSALIS PEDIS  ANTERIOR TIBIAL palpable  palpable     Gastrointestinal: soft, nontender, BS WNL, no r/g,no palpable  masses.  Musculoskeletal: Negative muscle atrophy/wasting. M/S 5/5 throughout in UE's, 4/5 in LE's, Extremities without ischemic changes.  Neurologic: A&O X 3; Appropriate Affect, Speech is normal CN 2-12 intact, Pain and light touch intact in extremities, Motor  exam as listed above.         Non-Invasive Vascular Imaging CAROTID DUPLEX 05/30/2014   CEREBROVASCULAR DUPLEX EVALUATION    INDICATION: Carotid artery disease     PREVIOUS INTERVENTION(S):     DUPLEX EXAM:     RIGHT  LEFT  Peak Systolic Velocities (cm/s) End Diastolic Velocities (cm/s) Plaque LOCATION Peak Systolic Velocities (cm/s) End Diastolic Velocities (cm/s) Plaque  74 10  CCA PROXIMAL 93 16   90 14  CCA MID 69 15   72 16  CCA DISTAL 46 15   94 20  ECA 72 13   58 9  ICA PROXIMAL 126 33 HT  63 16  ICA MID 110 28   64 21  ICA DISTAL 96 30     0.71 ICA / CCA Ratio (PSV) 1.82  Antegrade  Vertebral Flow Antegrade   638 Brachial Systolic Pressure (mmHg) 453  Multiphasic (Subclavian artery) Brachial Artery Waveforms Multiphasic (Subclavian artery)    Plaque Morphology:  HM = Homogeneous, HT = Heterogeneous, CP = Calcific Plaque, SP = Smooth Plaque, IP = Irregular Plaque  ADDITIONAL FINDINGS:     IMPRESSION: Bilateral internal carotid artery velocities suggest a <40% stenosis.     Compared to the previous exam:  No significant change in comparison to the last exam on 02/25/2013.     Assessment: Vicki Page is a 64 y.o. female who presents with asymptomatic minimal bilateral ICA stenosis. No significant change in comparison to the last  exam on 02/25/2013. Unfortunately she continues to smoke but seems motivated to quit.  Plan: Pt advised to see her PCP ASAP re her elevated blood pressure and to be evaluated after her fall today. The patient was counseled re smoking cessation and given several free resources re smoking cessation.  Follow-up in 18  months with Carotid Duplex.   I discussed in depth with the patient the nature of atherosclerosis, and emphasized the importance of maximal medical management including strict control of blood pressure, blood glucose, and lipid levels, obtaining regular exercise, and cessation of smoking.  The patient is aware that without maximal medical management the underlying atherosclerotic disease process will progress, limiting the benefit of any interventions. The patient was given information about stroke prevention and what symptoms should prompt the patient to seek immediate medical care. Thank you for allowing [place] to participate in this patient's care.  Clemon Chambers, RN, MSN, FNP-C [facility] Office: [phone]  Clinic Physician: Scot Dock  05/30/2014 1:38 PM

## 2014-05-30 NOTE — ED Provider Notes (Signed)
CSN: [id number]     Arrival date & time 05/30/14  2318 History  This chart was scribed for Vicki Balls, MD by Evelene Croon, ED Scribe. This patient was seen in room D32C/D32C and the patient's care was started 11:50 PM.    Chief Complaint  Patient presents with  . Fall    The history is provided by the patient. No language interpreter was used.     HPI Comments:  Vicki Page is a 64 y.o. female who presents to the Emergency Department s/p fall this afternoon complaining of moderate pain to her left thigh that has gradually worsened since fall. Pt reports assocated pain across her upper back pain, neck and RUE. Pt slipped and fell backwards  in the tub this afternoon. She denies head injury and LOC. She has been taking tylenol without relief     Past Medical History  Diagnosis Date  . Hypertension   . Aneurysm   . Hyperthyroidism   . Hyperlipidemia   . Carotid artery occlusion   . Ulcer of the stomach and intestine   . Arthritis   . DVT (deep venous thrombosis)   . Fall at home May 30, 2014    Pt slipped in tub- hurt left hip and right shoulder   Past Surgical History  Procedure Laterality Date  . Brain surgery      Per patient " Left side behind eyes  . Aneurysm coiling  July 26, 2010    stent  . Lower extremity angiogram Left 10-04-12    and Left iliac stent  . Leg surgery    . Tubal ligation    . Lower extremity angiogram N/A 10/04/2012    Procedure: LOWER EXTREMITY ANGIOGRAM;  Surgeon: Jettie Booze, MD;  Location: [facility] CATH LAB;  Service: Cardiovascular;  Laterality: N/A;  . Insertion of iliac stent  10/04/2012    Procedure: INSERTION OF ILIAC STENT;  Surgeon: Jettie Booze, MD;  Location: [facility] CATH LAB;  Service: Cardiovascular;;  Left External Iliac Artery   Family History  Problem Relation Age of Onset  . Deep vein thrombosis Mother   . Diabetes Mother     Amputation  . Heart disease Mother     Heart Disease before age 75  . Hyperlipidemia Mother    . Hypertension Mother   . Heart attack Mother   . Stroke Mother   . Heart disease Father     Heart Disease before age 1  . Hypertension Father   . Heart attack Father   . Hyperlipidemia Father   . Diabetes Brother   . Heart disease Brother     Heart Disease before age 22  . Hyperlipidemia Brother   . Hypertension Brother   . Heart attack Brother   . COPD Brother   . Diabetes Brother   . Heart disease Brother     Heart Disease before age 53   History  Substance Use Topics  . Smoking status: Light Tobacco Smoker -- 0.50 packs/day  . Smokeless tobacco: Never Used  . Alcohol Use: No   OB History    Gravida Para Term Preterm AB TAB SAB Ectopic Multiple Living   2 2 2       2      Review of Systems  Constitutional: Negative for fever and chills.  Respiratory: Negative for shortness of breath.   Cardiovascular: Negative for chest pain.  Musculoskeletal: Positive for myalgias (Left thigh and RUE ), back pain, arthralgias and neck pain.  Neurological: Negative for dizziness.  All other systems reviewed and are negative.     Allergies  Codeine; Darvon; and Flagyl  Home Medications   Prior to Admission medications   Medication Sig Start Date End Date Taking? Authorizing Provider  acetaminophen (TYLENOL) 500 MG tablet Take 1,000 mg by mouth 2 (two) times daily as needed for pain.     Historical Provider, MD  aspirin EC 81 MG tablet Take 81 mg by mouth daily.    Historical Provider, MD  buPROPion (WELLBUTRIN SR) 150 MG 12 hr tablet Take 150 mg by mouth 2 (two) times daily.    Historical Provider, MD  Calcium-Vitamin D (CALTRATE 600 PLUS-VIT D PO) Take by mouth.    Historical Provider, MD  Cholecalciferol (VITAMIN D) 2000 UNITS CAPS Take 4,000 Units by mouth daily.    Historical Provider, MD  clopidogrel (PLAVIX) 75 MG tablet Take 1 tablet (75 mg total) by mouth daily with breakfast. 10/05/12   Jettie Booze, MD  fluconazole (DIFLUCAN) 150 MG tablet Take 1 tablet (150  mg total) by mouth daily. 01/03/14   Olegario Messier, NP  levothyroxine (SYNTHROID, LEVOTHROID) 25 MCG tablet Take 25 mcg by mouth daily before breakfast.     Historical Provider, MD  losartan-hydrochlorothiazide (HYZAAR) 100-25 MG per tablet Take 1 tablet by mouth daily.    Historical Provider, MD  Multiple Vitamin (MULTIVITAMIN WITH MINERALS) TABS tablet Take 1 tablet by mouth daily. Centrum Silver    Historical Provider, MD  neomycin-bacitracin-polymyxin (NEOSPORIN) ointment Apply 1 application topically daily as needed (for wound care or cutical care).    Historical Provider, MD  pantoprazole (PROTONIX) 40 MG tablet Take 40 mg by mouth daily.    Historical Provider, MD  potassium gluconate 595 MG TABS Take 595 mg by mouth daily.    Historical Provider, MD  pyridOXINE (VITAMIN B-6) 100 MG tablet Take 100 mg by mouth daily.    Historical Provider, MD  simvastatin (ZOCOR) 40 MG tablet Take 40 mg by mouth daily with supper.     Historical Provider, MD  sucralfate (CARAFATE) 1 G tablet Take 1 g by mouth 2 (two) times daily.    Historical Provider, MD  Tetrahydrozoline HCl (VISINE OP) Place 1 drop into both eyes daily as needed (dry eyes).    Historical Provider, MD  tiZANidine (ZANAFLEX) 2 MG tablet Take 2 mg by mouth at bedtime as needed (muscel spasms).    Historical Provider, MD  traMADol (ULTRAM) 50 MG tablet Take 50 mg by mouth at bedtime as needed for pain.    Historical Provider, MD  vitamin B-12 (CYANOCOBALAMIN) 500 MCG tablet Take 500 mcg by mouth daily.    Historical Provider, MD  Wheat Dextrin (BENEFIBER PO) Take 10 mLs by mouth daily.    Historical Provider, MD   BP 141/76 mmHg  Pulse 92  Temp(Src) 98.2 F (36.8 C) (Oral)  Resp 16  Ht 5\' 2"  (1.575 m)  Wt 157 lb (71.215 kg)  BMI 28.71 kg/m2  SpO2 98% Physical Exam  Constitutional: She is oriented to person, place, and time. She appears well-developed and well-nourished. No distress.  HENT:  Head: Normocephalic and atraumatic.   Nose: Nose normal.  Mouth/Throat: Oropharynx is clear and moist. No oropharyngeal exudate.  Eyes: Conjunctivae and EOM are normal. Pupils are equal, round, and reactive to light. No scleral icterus.  Neck: Normal range of motion. Neck supple. No JVD present. No tracheal deviation present. No thyromegaly present.  Cardiovascular: Normal rate, regular rhythm  and normal heart sounds.  Exam reveals no gallop and no friction rub.   No murmur heard. Pulmonary/Chest: Effort normal and breath sounds normal. No respiratory distress. She has no wheezes. She exhibits no tenderness.  Abdominal: Soft. Bowel sounds are normal. She exhibits no distension and no mass. There is no tenderness. There is no rebound and no guarding.  Musculoskeletal: Normal range of motion. She exhibits tenderness. She exhibits no edema.  Tenderness to palpation bilateral shoulders and left hip. Moderate sized soft tissue hematoma seen to the left lateral thigh. There is tenderness to palpation and bruising.  Lymphadenopathy:    She has no cervical adenopathy.  Neurological: She is alert and oriented to person, place, and time. No cranial nerve deficit. She exhibits normal muscle tone.  Skin: Skin is warm and dry. No rash noted. No erythema. No pallor.  Nursing note and vitals reviewed.   ED Course  Procedures   DIAGNOSTIC STUDIES:  Oxygen Saturation is 98% on RA, normal by my interpretation.    COORDINATION OF CARE:  11:51 PM Will order XRs and pain meds. Discussed treatment plan with pt at bedside and pt agreed to plan.  Labs Review Labs Reviewed - No data to display  Imaging Review Dg Cervical Spine Complete  05/31/2014   CLINICAL DATA:  Worsening right shoulder pain on movement, after falling on bathtub. Initial encounter.  EXAM: CERVICAL SPINE  4+ VIEWS  COMPARISON:  CT of the cervical spine performed 08/28/2011  FINDINGS: There is no evidence of fracture or subluxation. Anterior osteophytes are seen along the  cervical spine, with minimal disc space narrowing at C4-C5. Vertebral bodies demonstrate normal height and alignment. Prevertebral soft tissues are within normal limits. The provided odontoid view demonstrates no significant abnormality.  The visualized lung apices are clear.  IMPRESSION: No evidence of acute fracture or subluxation along the cervical spine.   Electronically Signed   By: Garald Balding M.D.   On: 05/31/2014 01:32   Dg Shoulder Right  05/31/2014   CLINICAL DATA:  Right shoulder pain after falling in the bathtub.  EXAM: RIGHT SHOULDER - 2+ VIEW  COMPARISON:  None.  FINDINGS: There is no evidence of fracture or dislocation. There is no evidence of arthropathy or other focal bone abnormality. Soft tissues are unremarkable.  IMPRESSION: Normal exam.   Electronically Signed   By: Lorriane Shire M.D.   On: 05/31/2014 01:32   Dg Shoulder Left  05/31/2014   CLINICAL DATA:  Acute onset of left shoulder pain after falling on bathtub. Initial encounter.  EXAM: LEFT SHOULDER - 2+ VIEW  COMPARISON:  Left shoulder radiographs performed 03/07/2014  FINDINGS: There is no evidence of fracture or dislocation. The left humeral head is seated within the glenoid fossa. The acromioclavicular joint is unremarkable in appearance. No significant soft tissue abnormalities are seen. The visualized portions of the left lung are clear.  IMPRESSION: No evidence of fracture or dislocation.   Electronically Signed   By: Garald Balding M.D.   On: 05/31/2014 01:33   Dg Hip Unilat With Pelvis 2-3 Views Left  05/31/2014   CLINICAL DATA:  Left hip pain after falling in the bathtub.  EXAM: LEFT HIP (WITH PELVIS) 2-3 VIEWS  COMPARISON:  03/07/2014  FINDINGS: There is no fracture or dislocation. There is bilateral medial joint space narrowing at the hips. Pelvic bones are intact. Vascular stent in the left side of the pelvis.  IMPRESSION: No acute abnormality.  Joint space narrowing at both hips.  Electronically Signed   By:  Lorriane Shire M.D.   On: 05/31/2014 01:31   Dg Femur Port Min 2 Views Left  05/31/2014   CLINICAL DATA:  Acute onset of left thigh pain and bruising after falling on bathtub. Initial encounter.  EXAM: LEFT FEMUR PORTABLE 2 VIEWS  COMPARISON:  None.  FINDINGS: There is no evidence of fracture or dislocation. The left femur appears intact. Mild degenerative change is noted at the left knee, with marginal osteophytes arising at the medial and lateral compartments. Scattered vascular calcifications are seen. No definite knee joint effusion is identified.  IMPRESSION: No evidence of fracture or dislocation.   Electronically Signed   By: Garald Balding M.D.   On: 05/31/2014 01:34     EKG Interpretation None      MDM   Final diagnoses:  None   Patient does emergency department after a fall. She states this is purely a mechanical fall as she slipped on something wet. She has no prodromal symptoms such as headache or chest pain or abdominal pain. She's having pain over her bilateral shoulders, neck, left hip and femur which will be x-rayed. Patient is on Plavix and soft tissue hematoma was noted to the left lateral thigh. Patient was given motrin for pain control.  X-rays are negative for any acute fractures. Patient likely has significant bruising and soft tissue swelling due to home medication of Plavix. She was advised to continue Tylenol as needed for pain and see her primary care physician within 3 days for close follow-up. Her vital signs were within her normal limits and she is safe for discharge.   I personally performed the services described in this documentation, which was scribed in my presence. The recorded information has been reviewed and is accurate.    Vicki Balls, MD 05/31/14 0201

## 2014-05-30 NOTE — Patient Instructions (Addendum)
Stroke Prevention Some medical conditions and behaviors are associated with an increased chance of having a stroke. You may prevent a stroke by making healthy choices and managing medical conditions. HOW CAN I REDUCE MY RISK OF HAVING A STROKE?   Stay physically active. Get at least 30 minutes of activity on most or all days.  Do not smoke. It may also be helpful to avoid exposure to secondhand smoke.  Limit alcohol use. Moderate alcohol use is considered to be:  No more than 2 drinks per day for men.  No more than 1 drink per day for nonpregnant women.  Eat healthy foods. This involves:  Eating 5 or more servings of fruits and vegetables a day.  Making dietary changes that address high blood pressure (hypertension), high cholesterol, diabetes, or obesity.  Manage your cholesterol levels.  Making food choices that are high in fiber and low in saturated fat, trans fat, and cholesterol may control cholesterol levels.  Take any prescribed medicines to control cholesterol as directed by your health care provider.  Manage your diabetes.  Controlling your carbohydrate and sugar intake is recommended to manage diabetes.  Take any prescribed medicines to control diabetes as directed by your health care provider.  Control your hypertension.  Making food choices that are low in salt (sodium), saturated fat, trans fat, and cholesterol is recommended to manage hypertension.  Take any prescribed medicines to control hypertension as directed by your health care provider.  Maintain a healthy weight.  Reducing calorie intake and making food choices that are low in sodium, saturated fat, trans fat, and cholesterol are recommended to manage weight.  Stop drug abuse.  Avoid taking birth control pills.  Talk to your health care provider about the risks of taking birth control pills if you are over 35 years old, smoke, get migraines, or have ever had a blood clot.  Get evaluated for sleep  disorders (sleep apnea).  Talk to your health care provider about getting a sleep evaluation if you snore a lot or have excessive sleepiness.  Take medicines only as directed by your health care provider.  For some people, aspirin or blood thinners (anticoagulants) are helpful in reducing the risk of forming abnormal blood clots that can lead to stroke. If you have the irregular heart rhythm of atrial fibrillation, you should be on a blood thinner unless there is a good reason you cannot take them.  Understand all your medicine instructions.  Make sure that other conditions (such as anemia or atherosclerosis) are addressed. SEEK IMMEDIATE MEDICAL CARE IF:   You have sudden weakness or numbness of the face, arm, or leg, especially on one side of the body.  Your face or eyelid droops to one side.  You have sudden confusion.  You have trouble speaking (aphasia) or understanding.  You have sudden trouble seeing in one or both eyes.  You have sudden trouble walking.  You have dizziness.  You have a loss of balance or coordination.  You have a sudden, severe headache with no known cause.  You have new chest pain or an irregular heartbeat. Any of these symptoms may represent a serious problem that is an emergency. Do not wait to see if the symptoms will go away. Get medical help at once. Call your local emergency services (911 in [place] Do not drive yourself to the hospital. Document Released: 03/03/2004 Document Revised: 06/10/2013 Document Reviewed: 07/27/2012 [employer] Patient Information 2015 [employer]. This information is not intended to replace advice given   to you by your health care provider. Make sure you discuss any questions you have with your health care provider.    Smoking Cessation Quitting smoking is important to your health and has many advantages. However, it is not always easy to quit since nicotine is a very addictive drug. Oftentimes, people try 3 times or  more before being able to quit. This document explains the best ways for you to prepare to quit smoking. Quitting takes hard work and a lot of effort, but you can do it. ADVANTAGES OF QUITTING SMOKING  You will live longer, feel better, and live better.  Your body will feel the impact of quitting smoking almost immediately.  Within 20 minutes, blood pressure decreases. Your pulse returns to its normal level.  After 8 hours, carbon monoxide levels in the blood return to normal. Your oxygen level increases.  After 24 hours, the chance of having a heart attack starts to decrease. Your breath, hair, and body stop smelling like smoke.  After 48 hours, damaged nerve endings begin to recover. Your sense of taste and smell improve.  After 72 hours, the body is virtually free of nicotine. Your bronchial tubes relax and breathing becomes easier.  After 2 to 12 weeks, lungs can hold more air. Exercise becomes easier and circulation improves.  The risk of having a heart attack, stroke, cancer, or lung disease is greatly reduced.  After 1 year, the risk of coronary heart disease is cut in half.  After 5 years, the risk of stroke falls to the same as a nonsmoker.  After 10 years, the risk of lung cancer is cut in half and the risk of other cancers decreases significantly.  After 15 years, the risk of coronary heart disease drops, usually to the level of a nonsmoker.  If you are pregnant, quitting smoking will improve your chances of having a healthy baby.  The people you live with, especially any children, will be healthier.  You will have extra money to spend on things other than cigarettes. QUESTIONS TO THINK ABOUT BEFORE ATTEMPTING TO QUIT You may want to talk about your answers with your health care provider.  Why do you want to quit?  If you tried to quit in the past, what helped and what did not?  What will be the most difficult situations for you after you quit? How will you plan to  handle them?  Who can help you through the tough times? Your family? Friends? A health care provider?  What pleasures do you get from smoking? What ways can you still get pleasure if you quit? Here are some questions to ask your health care provider:  How can you help me to be successful at quitting?  What medicine do you think would be best for me and how should I take it?  What should I do if I need more help?  What is smoking withdrawal like? How can I get information on withdrawal? GET READY  Set a quit date.  Change your environment by getting rid of all cigarettes, ashtrays, matches, and lighters in your home, car, or work. Do not let people smoke in your home.  Review your past attempts to quit. Think about what worked and what did not. GET SUPPORT AND ENCOURAGEMENT You have a better chance of being successful if you have help. You can get support in many ways.  Tell your family, friends, and coworkers that you are going to quit and need their support. Ask them   not to smoke around you.  Get individual, group, or telephone counseling and support. Programs are available at local hospitals and health centers. Call your local health department for information about programs in your area.  Spiritual beliefs and practices may help some smokers quit.  Download a "quit meter" on your computer to keep track of quit statistics, such as how long you have gone without smoking, cigarettes not smoked, and money saved.  Get a self-help book about quitting smoking and staying off tobacco. LEARN NEW SKILLS AND BEHAVIORS  Distract yourself from urges to smoke. Talk to someone, go for a walk, or occupy your time with a task.  Change your normal routine. Take a different route to work. Drink tea instead of coffee. Eat breakfast in a different place.  Reduce your stress. Take a hot bath, exercise, or read a book.  Plan something enjoyable to do every day. Reward yourself for not  smoking.  Explore interactive web-based programs that specialize in helping you quit. GET MEDICINE AND USE IT CORRECTLY Medicines can help you stop smoking and decrease the urge to smoke. Combining medicine with the above behavioral methods and support can greatly increase your chances of successfully quitting smoking.  Nicotine replacement therapy helps deliver nicotine to your body without the negative effects and risks of smoking. Nicotine replacement therapy includes nicotine gum, lozenges, inhalers, nasal sprays, and skin patches. Some may be available over-the-counter and others require a prescription.  Antidepressant medicine helps people abstain from smoking, but how this works is unknown. This medicine is available by prescription.  Nicotinic receptor partial agonist medicine simulates the effect of nicotine in your brain. This medicine is available by prescription. Ask your health care provider for advice about which medicines to use and how to use them based on your health history. Your health care provider will tell you what side effects to look out for if you choose to be on a medicine or therapy. Carefully read the information on the package. Do not use any other product containing nicotine while using a nicotine replacement product.  RELAPSE OR DIFFICULT SITUATIONS Most relapses occur within the first 3 months after quitting. Do not be discouraged if you start smoking again. Remember, most people try several times before finally quitting. You may have symptoms of withdrawal because your body is used to nicotine. You may crave cigarettes, be irritable, feel very hungry, cough often, get headaches, or have difficulty concentrating. The withdrawal symptoms are only temporary. They are strongest when you first quit, but they will go away within 10-14 days. To reduce the chances of relapse, try to:  Avoid drinking alcohol. Drinking lowers your chances of successfully quitting.  Reduce the  amount of caffeine you consume. Once you quit smoking, the amount of caffeine in your body increases and can give you symptoms, such as a rapid heartbeat, sweating, and anxiety.  Avoid smokers because they can make you want to smoke.  Do not let weight gain distract you. Many smokers will gain weight when they quit, usually less than 10 pounds. Eat a healthy diet and stay active. You can always lose the weight gained after you quit.  Find ways to improve your mood other than smoking. FOR MORE INFORMATION  www.smokefree.gov  Document Released: 01/18/2001 Document Revised: 06/10/2013 Document Reviewed: 05/05/2011 [employer] Patient Information 2015 [employer]. This information is not intended to replace advice given to you by your health care provider. Make sure you discuss any questions you have with your health   care provider.    Smoking Cessation, Tips for Success If you are ready to quit smoking, congratulations! You have chosen to help yourself be healthier. Cigarettes bring nicotine, tar, carbon monoxide, and other irritants into your body. Your lungs, heart, and blood vessels will be able to work better without these poisons. There are many different ways to quit smoking. Nicotine gum, nicotine patches, a nicotine inhaler, or nicotine nasal spray can help with physical craving. Hypnosis, support groups, and medicines help break the habit of smoking. WHAT THINGS CAN I DO TO MAKE QUITTING EASIER?  Here are some tips to help you quit for good:  Pick a date when you will quit smoking completely. Tell all of your friends and family about your plan to quit on that date.  Do not try to slowly cut down on the number of cigarettes you are smoking. Pick a quit date and quit smoking completely starting on that day.  Throw away all cigarettes.   Clean and remove all ashtrays from your home, work, and car.  On a card, write down your reasons for quitting. Carry the card with you and read it  when you get the urge to smoke.  Cleanse your body of nicotine. Drink enough water and fluids to keep your urine clear or pale yellow. Do this after quitting to flush the nicotine from your body.  Learn to predict your moods. Do not let a bad situation be your excuse to have a cigarette. Some situations in your life might tempt you into wanting a cigarette.  Never have "just one" cigarette. It leads to wanting another and another. Remind yourself of your decision to quit.  Change habits associated with smoking. If you smoked while driving or when feeling stressed, try other activities to replace smoking. Stand up when drinking your coffee. Brush your teeth after eating. Sit in a different chair when you read the paper. Avoid alcohol while trying to quit, and try to drink fewer caffeinated beverages. Alcohol and caffeine may urge you to smoke.  Avoid foods and drinks that can trigger a desire to smoke, such as sugary or spicy foods and alcohol.  Ask people who smoke not to smoke around you.  Have something planned to do right after eating or having a cup of coffee. For example, plan to take a walk or exercise.  Try a relaxation exercise to calm you down and decrease your stress. Remember, you may be tense and nervous for the first 2 weeks after you quit, but this will pass.  Find new activities to keep your hands busy. Play with a pen, coin, or rubber band. Doodle or draw things on paper.  Brush your teeth right after eating. This will help cut down on the craving for the taste of tobacco after meals. You can also try mouthwash.   Use oral substitutes in place of cigarettes. Try using lemon drops, carrots, cinnamon sticks, or chewing gum. Keep them handy so they are available when you have the urge to smoke.  When you have the urge to smoke, try deep breathing.  Designate your home as a nonsmoking area.  If you are a heavy smoker, ask your health care provider about a prescription for  nicotine chewing gum. It can ease your withdrawal from nicotine.  Reward yourself. Set aside the cigarette money you save and buy yourself something nice.  Look for support from others. Join a support group or smoking cessation program. Ask someone at home or at work   to help you with your plan to quit smoking.  Always ask yourself, "Do I need this cigarette or is this just a reflex?" Tell yourself, "Today, I choose not to smoke," or "I do not want to smoke." You are reminding yourself of your decision to quit.  Do not replace cigarette smoking with electronic cigarettes (commonly called e-cigarettes). The safety of e-cigarettes is unknown, and some may contain harmful chemicals.  If you relapse, do not give up! Plan ahead and think about what you will do the next time you get the urge to smoke. HOW WILL I FEEL WHEN I QUIT SMOKING? You may have symptoms of withdrawal because your body is used to nicotine (the addictive substance in cigarettes). You may crave cigarettes, be irritable, feel very hungry, cough often, get headaches, or have difficulty concentrating. The withdrawal symptoms are only temporary. They are strongest when you first quit but will go away within 10-14 days. When withdrawal symptoms occur, stay in control. Think about your reasons for quitting. Remind yourself that these are signs that your body is healing and getting used to being without cigarettes. Remember that withdrawal symptoms are easier to treat than the major diseases that smoking can cause.  Even after the withdrawal is over, expect periodic urges to smoke. However, these cravings are generally short lived and will go away whether you smoke or not. Do not smoke! WHAT RESOURCES ARE AVAILABLE TO HELP ME QUIT SMOKING? Your health care provider can direct you to community resources or hospitals for support, which may include:  Group support.  Education.  Hypnosis.  Therapy. Document Released: 10/23/2003 Document  Revised: 06/10/2013 Document Reviewed: 07/12/2012 [employer] Patient Information 2015 [employer]. This information is not intended to replace advice given to you by your health care provider. Make sure you discuss any questions you have with your health care provider.  

## 2014-05-30 NOTE — Progress Notes (Signed)
Filed Vitals:   05/30/14 1334 05/30/14 1338 05/30/14 1347 05/30/14 1350  BP: 155/81 151/82 162/77 174/85  Pulse: 71 72 67 67  Resp:  16    Height:  5' 2.5" (1.588 m)    Weight:  162 lb (73.483 kg)    SpO2:  99%

## 2014-05-31 ENCOUNTER — Emergency Department (HOSPITAL_COMMUNITY): Payer: BC Managed Care – PPO

## 2014-05-31 DIAGNOSIS — S4991XA Unspecified injury of right shoulder and upper arm, initial encounter: Secondary | ICD-10-CM | POA: Diagnosis not present

## 2014-05-31 NOTE — Discharge Instructions (Signed)
Fall Prevention and Home Safety Vicki Page, continue to take Tylenol as needed at home for pain. See your primary care physician within 3 days for close follow-up. If symptoms worsen come back to emergency department immediately. Thank you. Falls cause injuries and can affect all age groups. It is possible to prevent falls.  HOW TO PREVENT FALLS  Wear shoes with rubber soles that do not have an opening for your toes.  Keep the inside and outside of your house well lit.  Use night lights throughout your home.  Remove clutter from floors.  Clean up floor spills.  Remove throw rugs or fasten them to the floor with carpet tape.  Do not place electrical cords across pathways.  Put grab bars by your tub, shower, and toilet. Do not use towel bars as grab bars.  Put handrails on both sides of the stairway. Fix loose handrails.  Do not climb on stools or stepladders, if possible.  Do not wax your floors.  Repair uneven or unsafe sidewalks, walkways, or stairs.  Keep items you use a lot within reach.  Be aware of pets.  Keep emergency numbers next to the telephone.  Put smoke detectors in your home and near bedrooms. Ask your doctor what other things you can do to prevent falls. Document Released: 11/20/2008 Document Revised: 07/26/2011 Document Reviewed: 04/26/2011 [facility] Patient Information 2015 [place], [place]. This information is not intended to replace advice given to you by your health care provider. Make sure you discuss any questions you have with your health care provider.

## 2014-05-31 NOTE — ED Notes (Signed)
Pt c/o bilateral shoulder pain, worsening pain to R shoulder on movement, and R sided hip pain after falling on the bathtub. Denies LOC. Reports increased pain on ambulation. No obvious deformity noted.

## 2014-06-03 NOTE — Addendum Note (Signed)
Addended by: Mena Goes on: 06/03/2014 03:25 PM   Modules accepted: Orders

## 2014-07-22 ENCOUNTER — Telehealth (HOSPITAL_COMMUNITY): Payer: Self-pay | Admitting: Interventional Radiology

## 2014-07-22 NOTE — Telephone Encounter (Signed)
Called pt, left VM for her to call to scheduled appt to see Deveshwar. JM

## 2014-07-28 ENCOUNTER — Other Ambulatory Visit (HOSPITAL_COMMUNITY): Payer: Self-pay | Admitting: Interventional Radiology

## 2014-07-28 DIAGNOSIS — I729 Aneurysm of unspecified site: Secondary | ICD-10-CM

## 2014-07-30 ENCOUNTER — Other Ambulatory Visit (HOSPITAL_COMMUNITY): Payer: Self-pay | Admitting: Interventional Radiology

## 2014-07-30 ENCOUNTER — Ambulatory Visit (HOSPITAL_COMMUNITY)
Admission: RE | Admit: 2014-07-30 | Discharge: 2014-07-30 | Disposition: A | Discharge status: Home / Self Care | Payer: BC Managed Care – PPO | Source: Ambulatory Visit | Attending: Interventional Radiology | Admitting: Interventional Radiology

## 2014-07-30 DIAGNOSIS — I6529 Occlusion and stenosis of unspecified carotid artery: Secondary | ICD-10-CM

## 2014-07-30 DIAGNOSIS — I729 Aneurysm of unspecified site: Secondary | ICD-10-CM

## 2014-07-30 DIAGNOSIS — R51 Headache: Secondary | ICD-10-CM

## 2014-07-30 DIAGNOSIS — R519 Headache, unspecified: Secondary | ICD-10-CM

## 2014-07-30 DIAGNOSIS — I671 Cerebral aneurysm, nonruptured: Secondary | ICD-10-CM

## 2014-08-05 ENCOUNTER — Other Ambulatory Visit: Payer: Self-pay | Admitting: Radiology

## 2014-08-07 ENCOUNTER — Other Ambulatory Visit: Payer: Self-pay | Admitting: Radiology

## 2014-08-08 ENCOUNTER — Ambulatory Visit (HOSPITAL_COMMUNITY)
Admission: RE | Admit: 2014-08-08 | Discharge: 2014-08-08 | Disposition: A | Discharge status: Home / Self Care | Payer: BC Managed Care – PPO | Source: Ambulatory Visit | Attending: Interventional Radiology | Admitting: Interventional Radiology

## 2014-08-08 ENCOUNTER — Encounter (HOSPITAL_COMMUNITY): Payer: Self-pay

## 2014-08-08 ENCOUNTER — Other Ambulatory Visit (HOSPITAL_COMMUNITY): Payer: Self-pay | Admitting: Interventional Radiology

## 2014-08-08 DIAGNOSIS — I6523 Occlusion and stenosis of bilateral carotid arteries: Secondary | ICD-10-CM | POA: Insufficient documentation

## 2014-08-08 DIAGNOSIS — Z8249 Family history of ischemic heart disease and other diseases of the circulatory system: Secondary | ICD-10-CM | POA: Diagnosis not present

## 2014-08-08 DIAGNOSIS — I10 Essential (primary) hypertension: Secondary | ICD-10-CM | POA: Insufficient documentation

## 2014-08-08 DIAGNOSIS — Z86718 Personal history of other venous thrombosis and embolism: Secondary | ICD-10-CM | POA: Diagnosis not present

## 2014-08-08 DIAGNOSIS — E785 Hyperlipidemia, unspecified: Secondary | ICD-10-CM | POA: Diagnosis not present

## 2014-08-08 DIAGNOSIS — Z48812 Encounter for surgical aftercare following surgery on the circulatory system: Secondary | ICD-10-CM | POA: Insufficient documentation

## 2014-08-08 DIAGNOSIS — Z7902 Long term (current) use of antithrombotics/antiplatelets: Secondary | ICD-10-CM | POA: Diagnosis not present

## 2014-08-08 DIAGNOSIS — I671 Cerebral aneurysm, nonruptured: Secondary | ICD-10-CM | POA: Insufficient documentation

## 2014-08-08 DIAGNOSIS — Z7982 Long term (current) use of aspirin: Secondary | ICD-10-CM | POA: Insufficient documentation

## 2014-08-08 DIAGNOSIS — E0590 Thyrotoxicosis, unspecified without thyrotoxic crisis or storm: Secondary | ICD-10-CM | POA: Insufficient documentation

## 2014-08-08 DIAGNOSIS — Z95820 Peripheral vascular angioplasty status with implants and grafts: Secondary | ICD-10-CM | POA: Insufficient documentation

## 2014-08-08 DIAGNOSIS — R51 Headache: Secondary | ICD-10-CM | POA: Diagnosis not present

## 2014-08-08 DIAGNOSIS — Z9181 History of falling: Secondary | ICD-10-CM | POA: Insufficient documentation

## 2014-08-08 DIAGNOSIS — R519 Headache, unspecified: Secondary | ICD-10-CM

## 2014-08-08 DIAGNOSIS — F17210 Nicotine dependence, cigarettes, uncomplicated: Secondary | ICD-10-CM | POA: Diagnosis not present

## 2014-08-08 DIAGNOSIS — I6529 Occlusion and stenosis of unspecified carotid artery: Secondary | ICD-10-CM

## 2014-08-08 LAB — CBC
HCT: 45.7 % (ref 36.0–46.0)
Hemoglobin: 15.5 g/dL — ABNORMAL HIGH (ref 12.0–15.0)
MCH: 28.5 pg (ref 26.0–34.0)
MCHC: 33.9 g/dL (ref 30.0–36.0)
MCV: 84.2 fL (ref 78.0–100.0)
Platelets: 221 10*3/uL (ref 150–400)
RBC: 5.43 MIL/uL — ABNORMAL HIGH (ref 3.87–5.11)
RDW: 15.2 % (ref 11.5–15.5)
WBC: 4.8 10*3/uL (ref 4.0–10.5)

## 2014-08-08 LAB — BASIC METABOLIC PANEL
Anion gap: 9 (ref 5–15)
BUN: 18 mg/dL (ref 6–20)
CO2: 26 mmol/L (ref 22–32)
Calcium: 9 mg/dL (ref 8.9–10.3)
Chloride: 105 mmol/L (ref 101–111)
Creatinine, Ser: 1.1 mg/dL — ABNORMAL HIGH (ref 0.44–1.00)
GFR calc Af Amer: >60 mL/min (ref >60)
GFR calc non Af Amer: 52 mL/min — ABNORMAL LOW (ref >60)
Glucose, Bld: 98 mg/dL (ref 65–99)
Potassium: 3.6 mmol/L (ref 3.5–5.1)
Sodium: 140 mmol/L (ref 135–145)

## 2014-08-08 LAB — APTT: aPTT: 28 seconds (ref 24–37)

## 2014-08-08 LAB — PROTIME-INR
INR: 1.04 (ref 0.00–1.49)
Prothrombin Time: 13.8 seconds (ref 11.6–15.2)

## 2014-08-08 MED ORDER — FENTANYL CITRATE (PF) 100 MCG/2ML IJ SOLN
INTRAMUSCULAR | Status: AC | PRN
  Administered 2014-08-08: 25 ug via INTRAVENOUS

## 2014-08-08 MED ORDER — IOHEXOL 300 MG/ML  SOLN
150.0000 mL | Freq: Once | INTRAMUSCULAR | Status: AC | PRN
  Administered 2014-08-08: 200 mL via INTRAVENOUS

## 2014-08-08 MED ORDER — HEPARIN SODIUM (PORCINE) 1000 UNIT/ML IJ SOLN
INTRAMUSCULAR | Status: AC | PRN
  Administered 2014-08-08: 1000 [IU] via INTRAVENOUS

## 2014-08-08 MED ORDER — SODIUM CHLORIDE 0.9 % IV SOLN
Freq: Once | INTRAVENOUS | Status: AC
  Administered 2014-08-08: 08:00:00 via INTRAVENOUS

## 2014-08-08 MED ORDER — SODIUM CHLORIDE 0.9 % IV SOLN: INTRAVENOUS | Status: AC

## 2014-08-08 MED ORDER — HYDRALAZINE HCL 20 MG/ML IJ SOLN
5.0000 mg | INTRAMUSCULAR | Status: DC | PRN
  Administered 2014-08-08: 2.5 mg via INTRAVENOUS
  Administered 2014-08-08: 5 mg via INTRAVENOUS

## 2014-08-08 MED ORDER — HYDRALAZINE HCL 20 MG/ML IJ SOLN
INTRAMUSCULAR | Status: AC
  Filled 2014-08-08: qty 1

## 2014-08-08 MED ORDER — HYDRALAZINE HCL 20 MG/ML IJ SOLN
5.0000 mg | Freq: Once | INTRAMUSCULAR | Status: AC
  Administered 2014-08-08: 5 mg via INTRAVENOUS

## 2014-08-08 MED ORDER — HEPARIN SOD (PORK) LOCK FLUSH 100 UNIT/ML IV SOLN
INTRAVENOUS | Status: AC
  Filled 2014-08-08: qty 20

## 2014-08-08 MED ORDER — LIDOCAINE HCL 1 % IJ SOLN
INTRAMUSCULAR | Status: AC
  Filled 2014-08-08: qty 20

## 2014-08-08 MED ORDER — MIDAZOLAM HCL 2 MG/2ML IJ SOLN
INTRAMUSCULAR | Status: AC | PRN
  Administered 2014-08-08: 1 mg via INTRAVENOUS

## 2014-08-08 MED ORDER — FENTANYL CITRATE (PF) 100 MCG/2ML IJ SOLN
INTRAMUSCULAR | Status: AC
  Filled 2014-08-08: qty 2

## 2014-08-08 MED ORDER — MIDAZOLAM HCL 2 MG/2ML IJ SOLN
INTRAMUSCULAR | Status: AC
  Filled 2014-08-08: qty 2

## 2014-08-08 NOTE — H&P (Signed)
Chief Complaint: Rt side headaches  Referring Physician(s): Dr Orvan Falconer  History of Present Illness: Vicki Page is a 64 y.o. 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 Hypertension   . Aneurysm   . Hyperthyroidism   . Hyperlipidemia   . Carotid artery occlusion   . Ulcer of the stomach and intestine   . Arthritis   . DVT (deep venous thrombosis)   . Fall at home May 30, 2014    Pt slipped in tub- hurt left hip and right shoulder    Past Surgical History  Procedure Laterality Date  . Brain surgery      Per patient " Left side behind eyes  . Aneurysm coiling  July 26, 2010    stent  . Lower extremity angiogram Left 10-04-12    and Left iliac stent  . Leg surgery    . Tubal ligation    . Lower extremity angiogram N/A 10/04/2012    Procedure: LOWER EXTREMITY ANGIOGRAM;  Surgeon: Jettie Booze, MD;  Location: [facility] CATH LAB;  Service: Cardiovascular;  Laterality: N/A;  . Insertion of iliac stent  10/04/2012    Procedure: INSERTION OF ILIAC STENT;  Surgeon: Jettie Booze, MD;  Location: [facility] CATH LAB;  Service: Cardiovascular;;  Left External Iliac Artery    Allergies: Codeine; Darvon; and Flagyl  Medications: Prior to Admission medications   Medication Sig Start Date End Date Taking? Authorizing Provider  acetaminophen (TYLENOL) 500 MG tablet Take 1,000 mg by mouth 2 (two) times daily as needed for pain.    Yes Historical Provider, MD  aspirin EC 81 MG tablet Take 81 mg by mouth daily.   Yes Historical Provider, MD  buPROPion (WELLBUTRIN SR) 150 MG 12 hr tablet Take 150 mg by mouth 2 (two) times daily.    Yes Historical Provider, MD  Calcium-Vitamin D (CALTRATE 600 PLUS-VIT D PO) Take by mouth.   Yes Historical Provider, MD  Cholecalciferol (VITAMIN D) 2000 UNITS CAPS Take 4,000 Units by mouth daily.   Yes Historical Provider, MD  clopidogrel (PLAVIX) 75 MG tablet Take 1 tablet (75 mg total) by mouth daily with breakfast. 10/05/12  Yes Jettie Booze, MD  levothyroxine (SYNTHROID, LEVOTHROID) 25 MCG tablet Take 25 mcg by mouth daily before breakfast.    Yes Historical Provider, MD  losartan-hydrochlorothiazide (HYZAAR) 100-25 MG per tablet Take 1 tablet by mouth daily.   Yes Historical Provider, MD  Multiple Vitamin (MULTIVITAMIN WITH MINERALS) TABS tablet Take 1 tablet by mouth daily. Centrum Silver   Yes Historical Provider, MD  pantoprazole (PROTONIX) 40 MG tablet Take 40 mg by mouth daily.   Yes Historical Provider, MD  potassium gluconate 595 MG TABS Take 595 mg by mouth daily.   Yes Historical Provider, MD  pyridOXINE (VITAMIN B-6) 100 MG tablet Take 100 mg by mouth daily.   Yes Historical Provider, MD  simvastatin (ZOCOR) 40 MG tablet Take 40 mg by mouth daily with supper.    Yes Historical Provider, MD  traMADol (ULTRAM) 50 MG tablet Take 50 mg by mouth at bedtime as needed for pain.   Yes Historical Provider, MD  vitamin B-12 (  CYANOCOBALAMIN) 500 MCG tablet Take 500 mcg by mouth daily.   Yes Historical Provider, MD     Family History  Problem Relation Age of Onset  . Deep vein thrombosis Mother   . Diabetes Mother     Amputation  . Heart disease Mother     Heart Disease before age 24  . Hyperlipidemia Mother   . Hypertension Mother   . Heart attack Mother   . Stroke Mother   . Heart disease Father     Heart Disease before age 40  . Hypertension Father   . Heart attack Father   . Hyperlipidemia Father   . Diabetes Brother   . Heart disease Brother     Heart Disease before age 8  . Hyperlipidemia Brother   . Hypertension Brother   . Heart attack Brother   . COPD  Brother   . Diabetes Brother   . Heart disease Brother     Heart Disease before age 108    History   Social History  . Marital Status: Single    Spouse Name: N/A  . Number of Children: N/A  . Years of Education: N/A   Social History Main Topics  . Smoking status: Light Tobacco Smoker -- 0.50 packs/day  . Smokeless tobacco: Never Used  . Alcohol Use: No  . Drug Use: No  . Sexual Activity: Not on file   Other Topics Concern  . None   Social History Narrative     Review of Systems: A 12 point ROS discussed and pertinent positives are indicated in the HPI above.  All other systems are negative.  Review of Systems  Constitutional: Negative for fever, activity change, appetite change and unexpected weight change.  HENT: Negative for hearing loss and tinnitus.   Eyes: Negative for visual disturbance.  Respiratory: Negative for shortness of breath.   Gastrointestinal: Negative for abdominal pain.  Neurological: Positive for headaches. Negative for dizziness, tremors, seizures, syncope, facial asymmetry, speech difficulty, weakness, light-headedness and numbness.  Psychiatric/Behavioral: Negative for behavioral problems and confusion.    Vital Signs: BP 170/64 mmHg  Pulse 78  Temp(Src) 98.1 F (36.7 C)  Resp 18  Ht 5\' 2"  (1.575 m)  Wt 160 lb (72.576 kg)  BMI 29.26 kg/m2  SpO2 98%  Physical Exam  Constitutional: She is oriented to person, place, and time. She appears well-nourished.  HENT:  Head: Atraumatic.  Eyes: EOM are normal.  Cardiovascular: Normal rate, regular rhythm and normal heart sounds.   No murmur heard. Pulmonary/Chest: Effort normal and breath sounds normal. She has no wheezes.  Abdominal: Soft. Bowel sounds are normal. There is no tenderness.  Musculoskeletal: Normal range of motion.  Neurological: She is alert and oriented to person, place, and time.  Skin: Skin is warm and dry.  Psychiatric: She has a normal mood and affect. Her behavior is  normal. Judgment and thought content normal.  Nursing note and vitals reviewed.   Mallampati Score:  MD Evaluation Airway: WNL Heart: WNL Abdomen: WNL Chest/ Lungs: WNL ASA  Classification: 3 Mallampati/Airway Score: One  Imaging: Ir Radiologist Eval & Mgmt  08/05/2014   EXAM: ESTABLISHED PATIENT OFFICE VISIT  CHIEF COMPLAINT: Right-sided headaches. Previous history of endovascular treated left-sided intracranial aneurysm.  Current Pain Level: 1-10  HISTORY OF PRESENT ILLNESS: The patient is a 64 year old, right-handed lady who is status post endovascular treatment of left internal carotid artery intracranial aneurysm with coils, and stent assistance a few years ago.  The patient was then  lost to follow-up.  Patient returns today with symptoms of worsening right-sided headaches which she claims appear in frequency of 4-5 per week.  These are headaches which are preceded by a sensation of feeling unwell, followed by right-sided throbbing headache associated with photophobia and occasional nausea.  She denies any symptoms of blindness, blurring of vision, double vision, loss of consciousness, or altered mentation.  The headaches may last a few hours. They usually respond to some degree by taking Tylenol and the patient being able to sleep them off. These headaches apparently have recently increased in frequency.  The patient also gives a 3 episode history of what she describes of vertigo associated with nausea and mild gait imbalance. The most recent occurred approximately 2 to 3 weeks ago.  At the time of her last follow-up she was also noted to have stenosis of the left vertebrobasilar junction and also the vertebral arteries proximally.  She was also noted to have atherosclerotic related stenosis of her internal carotid arteries.  Past Medical History: Arthritis, depression. High blood pressure. High cholesterol. Reflux indigestion. Thyroid disease. Peripheral vascular disease.  Medications:  Calcium/vitamin-D. Plavix 75 mg once a day. Losartan hydrochlorothiazide 100/25 mg. Levothyroxine. Aspirin 81 mg a day. Wellbutrin. Diflucan. Protonix. Simvastatin. Vitamin B12. Potassium gluconate. Multivitamins.  Allergies:  Codeine, Darvon and Flagyl.  Social history: Patient is retired. Lives by herself. Denies drinking alcohol. Stopped smoking approximately 2 months ago. Denies use of recreational chemicals. Drinks up to 4 cups of decaffeinated coffee every day.  Family History:  Noncontributory.  PHYSICAL EXAMINATION: On brief examination patient appears in no acute distress.  Affect within normal limits.  Neurologically alert, awake, oriented to time, place, space. Speech and comprehension within normal limits. No lateralizing cranial nerve, motor, sensory or station and gait abnormalities.  ASSESSMENT AND PLAN: The patient's most recent catheter angiogram of 2012 and MRI/MRA of 2013 were reviewed.  In view of the fact the patient has worsening headaches, with symptoms of vertebrobasilar insufficiency, further evaluation to see if there has been progression of the intracranial left-sided vertebrobasilar stenosis, and of the intracranial internal carotid artery stenoses, a formal catheter angiogram will be undertaken. The patient understands the risks, the reasons, and the alternatives.  She is agreeable to undergo a formal catheter angiogram which will be scheduled at the earliest possible.  In the meantime, the patient has been encouraged to continue taking her present medications. She was asked to call should she have any concerns or questions.   Electronically Signed   By: Luanne Bras M.D.   On: 07/31/2014 15:08    Labs:  CBC: No results for input(s): WBC, HGB, HCT, PLT in the last 8760 hours.  COAGS: No results for input(s): INR, APTT in the last 8760 hours.  BMP: No results for input(s): NA, K, CL, CO2, GLUCOSE, BUN, CALCIUM, CREATININE, GFRNONAA, GFRAA in the last 8760  hours.  Invalid input(s): CMP  LIVER FUNCTION TESTS: No results for input(s): BILITOT, AST, ALT, ALKPHOS, PROT, ALBUMIN in the last 8760 hours.  TUMOR MARKERS: No results for input(s): AFPTM, CEA, CA199, CHROMGRNA in the last 8760 hours.  Assessment and Plan:  Hx L internal carotid artery hypophyseal aneurysm stent assisted coiling 02/2010 Recheck arteriogram 07/2010 shows stable aneurysm but did reveal some stenosis L VBJ Lost to follow u[p Now with Rt sided HA 4-5x/week x few months Now scheduled for cerebral arteriogram for full evaluation Risks and Benefits discussed with the patient including, but not limited to bleeding, infection, vascular  injury, contrast induced renal failure, stroke or even death. All of the patient's questions were answered, patient is agreeable to proceed. Consent signed and in chart.  Thank you for this interesting consult.  I greatly enjoyed meeting YEZENIA FREDRICK and look forward to participating in their care.  Signed: TURPIN,PAMELA A 08/08/2014, 7:47 AM   I spent a total of  20 Minutes   in face to face in clinical consultation, greater than 50% of which was counseling/coordinating care for cerebral arteriogram

## 2014-08-08 NOTE — Procedures (Signed)
S/P 4 vssel cerebral arteriogram. RT CFa approach. Findings. 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

## 2014-08-08 NOTE — Progress Notes (Signed)
Pressure dressing removed form right groin, occlusive dressing intact.  Pt ambulated to bathroom without difficulty  Ready for discharge.

## 2014-08-08 NOTE — Discharge Instructions (Signed)

## 2014-08-14 ENCOUNTER — Other Ambulatory Visit: Payer: Self-pay

## 2014-08-14 DIAGNOSIS — Z1231 Encounter for screening mammogram for malignant neoplasm of breast: Secondary | ICD-10-CM

## 2014-09-12 ENCOUNTER — Ambulatory Visit: Payer: BC Managed Care – PPO

## 2014-09-15 ENCOUNTER — Telehealth: Payer: Self-pay | Admitting: Internal Medicine

## 2014-09-15 DIAGNOSIS — Z0279 Encounter for issue of other medical certificate: Secondary | ICD-10-CM | POA: Diagnosis not present

## 2014-09-15 NOTE — Telephone Encounter (Signed)
Rec'd from [facility] forward 64 pages to Dr.Pyrtle

## 2014-09-16 ENCOUNTER — Ambulatory Visit
Admission: RE | Admit: 2014-09-16 | Discharge: 2014-09-16 | Disposition: A | Discharge status: Home / Self Care | Payer: BC Managed Care – PPO | Source: Ambulatory Visit

## 2014-09-16 DIAGNOSIS — Z1231 Encounter for screening mammogram for malignant neoplasm of breast: Secondary | ICD-10-CM

## 2014-09-24 ENCOUNTER — Encounter: Payer: Self-pay | Admitting: Internal Medicine

## 2014-10-13 ENCOUNTER — Emergency Department (HOSPITAL_COMMUNITY): Payer: BC Managed Care – PPO

## 2014-10-13 ENCOUNTER — Emergency Department (HOSPITAL_COMMUNITY)
Admission: EM | Admit: 2014-10-13 | Discharge: 2014-10-13 | Disposition: A | Discharge status: Home / Self Care | Payer: BC Managed Care – PPO | Attending: Emergency Medicine | Admitting: Emergency Medicine

## 2014-10-13 ENCOUNTER — Encounter (HOSPITAL_COMMUNITY): Payer: Self-pay | Admitting: Vascular Surgery

## 2014-10-13 DIAGNOSIS — E785 Hyperlipidemia, unspecified: Secondary | ICD-10-CM | POA: Diagnosis not present

## 2014-10-13 DIAGNOSIS — Z7902 Long term (current) use of antithrombotics/antiplatelets: Secondary | ICD-10-CM | POA: Diagnosis not present

## 2014-10-13 DIAGNOSIS — K529 Noninfective gastroenteritis and colitis, unspecified: Secondary | ICD-10-CM | POA: Diagnosis not present

## 2014-10-13 DIAGNOSIS — M545 Low back pain: Secondary | ICD-10-CM | POA: Diagnosis not present

## 2014-10-13 DIAGNOSIS — Z86718 Personal history of other venous thrombosis and embolism: Secondary | ICD-10-CM | POA: Diagnosis not present

## 2014-10-13 DIAGNOSIS — E0590 Thyrotoxicosis, unspecified without thyrotoxic crisis or storm: Secondary | ICD-10-CM | POA: Diagnosis not present

## 2014-10-13 DIAGNOSIS — I10 Essential (primary) hypertension: Secondary | ICD-10-CM | POA: Diagnosis not present

## 2014-10-13 DIAGNOSIS — K6389 Other specified diseases of intestine: Secondary | ICD-10-CM | POA: Insufficient documentation

## 2014-10-13 DIAGNOSIS — Z79899 Other long term (current) drug therapy: Secondary | ICD-10-CM | POA: Insufficient documentation

## 2014-10-13 DIAGNOSIS — Z7982 Long term (current) use of aspirin: Secondary | ICD-10-CM | POA: Insufficient documentation

## 2014-10-13 DIAGNOSIS — R109 Unspecified abdominal pain: Secondary | ICD-10-CM | POA: Diagnosis present

## 2014-10-13 DIAGNOSIS — R140 Abdominal distension (gaseous): Secondary | ICD-10-CM

## 2014-10-13 DIAGNOSIS — Z720 Tobacco use: Secondary | ICD-10-CM | POA: Diagnosis not present

## 2014-10-13 LAB — URINALYSIS, ROUTINE W REFLEX MICROSCOPIC
Glucose, UA: NEGATIVE mg/dL
Hgb urine dipstick: NEGATIVE
Ketones, ur: NEGATIVE mg/dL
Leukocytes, UA: NEGATIVE
Nitrite: NEGATIVE
Protein, ur: NEGATIVE mg/dL
Specific Gravity, Urine: 1.027 (ref 1.005–1.030)
Urobilinogen, UA: 0.2 mg/dL (ref 0.0–1.0)
pH: 5 (ref 5.0–8.0)

## 2014-10-13 LAB — COMPREHENSIVE METABOLIC PANEL WITH GFR
ALT: 20 U/L (ref 14–54)
AST: 24 U/L (ref 15–41)
Albumin: 3.4 g/dL — ABNORMAL LOW (ref 3.5–5.0)
Alkaline Phosphatase: 78 U/L (ref 38–126)
Anion gap: 7 (ref 5–15)
BUN: 16 mg/dL (ref 6–20)
CO2: 25 mmol/L (ref 22–32)
Calcium: 9.4 mg/dL (ref 8.9–10.3)
Chloride: 108 mmol/L (ref 101–111)
Creatinine, Ser: 1.31 mg/dL — ABNORMAL HIGH (ref 0.44–1.00)
GFR calc Af Amer: 49 mL/min — ABNORMAL LOW
GFR calc non Af Amer: 42 mL/min — ABNORMAL LOW
Glucose, Bld: 105 mg/dL — ABNORMAL HIGH (ref 65–99)
Potassium: 4 mmol/L (ref 3.5–5.1)
Sodium: 140 mmol/L (ref 135–145)
Total Bilirubin: 0.6 mg/dL (ref 0.3–1.2)
Total Protein: 6.9 g/dL (ref 6.5–8.1)

## 2014-10-13 LAB — CBC
HCT: 43.4 % (ref 36.0–46.0)
Hemoglobin: 14.1 g/dL (ref 12.0–15.0)
MCH: 28.1 pg (ref 26.0–34.0)
MCHC: 32.5 g/dL (ref 30.0–36.0)
MCV: 86.6 fL (ref 78.0–100.0)
Platelets: 213 K/uL (ref 150–400)
RBC: 5.01 MIL/uL (ref 3.87–5.11)
RDW: 14.6 % (ref 11.5–15.5)
WBC: 5.2 K/uL (ref 4.0–10.5)

## 2014-10-13 LAB — LIPASE, BLOOD: Lipase: 24 U/L (ref 22–51)

## 2014-10-13 LAB — POC OCCULT BLOOD, ED: Fecal Occult Bld: NEGATIVE

## 2014-10-13 MED ORDER — ONDANSETRON 4 MG PO TBDP: ORAL_TABLET | ORAL | Status: DC

## 2014-10-13 MED ORDER — KETOROLAC TROMETHAMINE 30 MG/ML IJ SOLN
30.0000 mg | Freq: Once | INTRAMUSCULAR | Status: AC
  Administered 2014-10-13: 30 mg via INTRAVENOUS
  Filled 2014-10-13: qty 1

## 2014-10-13 MED ORDER — IOHEXOL 300 MG/ML  SOLN: 25.0000 mL | Freq: Once | INTRAMUSCULAR | Status: DC | PRN

## 2014-10-13 MED ORDER — IOHEXOL 300 MG/ML  SOLN
100.0000 mL | Freq: Once | INTRAMUSCULAR | Status: AC | PRN
  Administered 2014-10-13: 100 mL via INTRAVENOUS

## 2014-10-13 MED ORDER — CIPROFLOXACIN HCL 500 MG PO TABS: 500.0000 mg | ORAL_TABLET | Freq: Two times a day (BID) | ORAL | Status: DC

## 2014-10-13 MED ORDER — ONDANSETRON HCL 4 MG/2ML IJ SOLN
4.0000 mg | Freq: Once | INTRAMUSCULAR | Status: AC
  Administered 2014-10-13: 4 mg via INTRAVENOUS
  Filled 2014-10-13: qty 2

## 2014-10-13 MED ORDER — METRONIDAZOLE 500 MG PO TABS: 500.0000 mg | ORAL_TABLET | Freq: Two times a day (BID) | ORAL | Status: DC

## 2014-10-13 NOTE — Discharge Instructions (Signed)
Take Zofran as directed as needed for nausea. Take both antibiotics as directed. It is important to complete the entire course of the antibiotic.  Colitis Colitis is inflammation of the colon. Colitis can be a short-term or long-standing (chronic) illness. Crohn's disease and ulcerative colitis are 2 types of colitis which are chronic. They usually require lifelong treatment. CAUSES  There are many different causes of colitis, including:  Viruses.  Germs (bacteria).  Medicine reactions. SYMPTOMS   Diarrhea.  Intestinal bleeding.  Pain.  Fever.  Throwing up (vomiting).  Tiredness (fatigue).  Weight loss.  Bowel blockage. DIAGNOSIS  The diagnosis of colitis is based on examination and stool or blood tests. X-rays, CT scan, and colonoscopy may also be needed. TREATMENT  Treatment may include:  Fluids given through the vein (intravenously).  Bowel rest (nothing to eat or drink for a period of time).  Medicine for pain and diarrhea.  Medicines (antibiotics) that kill germs.  Cortisone medicines.  Surgery. HOME CARE INSTRUCTIONS   Get plenty of rest.  Drink enough water and fluids to keep your urine clear or pale yellow.  Eat a well-balanced diet.  Call your caregiver for follow-up as recommended. SEEK IMMEDIATE MEDICAL CARE IF:   You develop chills.  You have an oral temperature above 102 F (38.9 C), not controlled by medicine.  You have extreme weakness, fainting, or dehydration.  You have repeated vomiting.  You develop severe belly (abdominal) pain or are passing bloody or tarry stools. MAKE SURE YOU:   Understand these instructions.  Will watch your condition.  Will get help right away if you are not doing well or get worse. Document Released: 03/03/2004 Document Revised: 04/18/2011 Document Reviewed: 05/29/2009 [facility] Patient Information 2015 [place], [place]. This information is not intended to replace advice given to you by your health  care provider. Make sure you discuss any questions you have with your health care provider. Colon Mass, Adult A mass is a lump that either your caregiver found during an examination or you found before seeing your caregiver. The colon is a major part of the large intestine. There are many possible reasons why a lump has appeared. Testing will help determine the cause and the steps to a solution. CAUSES  Before complete testing is done, it may be difficult or impossible for your caregiver to know if the lump is truly in the colon or comes from one of the organs that are next to the colon. Problems in the liver, gallbladder, pancreas, kidney, small intestine, uterus, and ovaries can also lead to a lump that might seem to be in the colon. If testing shows that the mass is in the colon, there are still many possible causes, including:  Tumors and cancers. These problems are relatively common and are the greatest source of worry for patients. Cancerous lumps in the colon may be due to cancers that started in the colon or due to cancers that started in other areas and then spread to the colon. Many cancers are very treatable when found early.  Noncancerous tumors and masses. There are a large number of common and uncommon noncancerous problems that can lead to a mass in the colon. Before testing or surgery, it may be impossible to tell the difference between this problem and a cancer.  Infection. Certain types of infections can produce a mass in the colon. The infection might be caused by a bacteria. The treatment might include medicine that kills bacteria (antibiotics). Masses from infection can also be  caused by certain viruses, fungi, and parasites. If infection is the cause, your caregiver will be able to determine the type of germ responsible for the mass by doing testing.  Inflammatory bowel disease. These are diseases thought to be caused by a defect in the immune system of the intestine. Two  inflammatory bowel diseases are ulcerative colitis and Crohn disease. They are lifelong problems with symptoms that can come and go. Not all patients with these diseases will develop a mass in the colon.  Blood vessel problems. A partial block of the blood supply to the intestine can lead to swelling of the wall of the colon. If this happens over and over again, a mass can develop.  Past surgery. If there has been colon surgery in the past and there is a lot of scarring that forms during the process of healing, this can eventually feel like a mass when examined by your caregiver. As with the other problems described above, this may or may not be associated with symptoms or feeling badly.  Volvulus. This is a condition where the colon twists on the organ or tissue that supports the colon (mesentery). It occurs most often near the very end of the colon and is a common cause for blockage of the colon. Along with a mass, there are almost always symptoms of pain, constipation, and bloating with this condition. SYMPTOMS  In people with a colon mass, there may be a large variety of associated symptoms, including:   No symptoms, other than the appearance of the mass itself.  Cramping, nausea, or diarrhea.  Fever, vomiting, or weakness.  Abdominal, side, or back pain.  Weight loss.  Constipation.  Bleeding from the rectum. DIAGNOSIS  Because of the large number of causes of a mass in the colon, your caregiver will ask you to undergo testing in order to get a clear diagnosis in a timely manner. The tests might include some or all of the following:  Blood tests. A blood test may include a blood count; a measurement of common minerals in the blood; kidney, liver, and pancreas function; and others.  X-rays. Plain X-rays and special X-rays may be requested.  Ultrasonography. This is a test that uses sound waves. The sound waves bounce off the organs like an echo. The echoes are "heard" by a device  called a transducer. The transducer converts the sounds into an electronic picture of the organ or structure. Your caregiver will examine this picture.  CT scan and MRI imaging. Each test can provide additional information about the different characteristics of the mass and can help to develop a final plan for diagnostics and treatment. If cancer is suspected, these special tests can also help to show any spread of the cancer to other parts of the body.  Colonoscopy. This is a special exam of the inside of the colon with a slim, flexible, lighted tube. This tool allows your caregiver to get a direct look at the mass from the inside of the colon. Sometimes, this allows removing a very small piece of the mass (biopsy). This piece of tissue can then be examined in a lab. Looking at the tissue in this way will frequently lead to a clear diagnosis.  Surgery. Sometimes, a diagnosis can only be made by carrying out an operation and obtaining a biopsy sample. Many times, the biopsy sample is obtained and the mass is removed during the same operation. These are the most common ways for determining the exact cause of the  mass. Your caregiver may recommend other tests that are not listed here. TREATMENT  Treatments can only be recommended after a diagnosis is made. Your caregiver will discuss your test results with you, the meanings of the tests, and the recommended steps to begin treatment. The caregiver will also indicate whether you need to be examined by specialists as you go through the steps of diagnosis and while a treatment plan is being developed. HOME CARE INSTRUCTIONS   Test preparation. Carefully follow instructions when preparing for certain tests. This may involve medicines that clean out the intestines before colonoscopy, fasting before certain blood tests, or drinking special "contrast" fluids that are necessary for CT scan and MRI images.  Medicine. Your caregiver may prescribe medicine to help  relieve symptoms while you undergo testing. It is important that your current medicines (prescription, nonprescription, herbal, or vitamins) be kept in mind when new prescriptions are recommended.  Diet. There may be a need for changes in diet to help with symptom relief while testing is being done. If this applies to you, your caregiver will discuss these changes with you. SEEK MEDICAL CARE IF:   You cannot hold down any of the recommended fluids used to prepare for tests such as colonoscopy, CT scan, or MRI.  You feel that you are having trouble with any new prescriptions.  You develop new symptoms (such as pain, vomiting, diarrhea, or fever) or other problems that were not present at the last exam. SEEK IMMEDIATE MEDICAL CARE IF:   You vomit bright red blood or material that looks like coffee grounds.  You have blood in your stools, or the stools turn black and tarry.  You have a fever.  You develop easy bruising or bleeding.  You develop pain that is not controlled by your medicine.  You feel worsening weakness, or you have a fainting episode.  You feel that the mass has suddenly gotten larger.  You develop severe bloating in the abdomen. MAKE SURE YOU:   Understand these instructions.  Will watch your condition.  Will get help right away if you are not doing well or get worse. Document Released: 05/03/2006 Document Revised: 05/21/2012 Document Reviewed: 01/12/2007 [facility] Patient Information 2015 [place], [place]. This information is not intended to replace advice given to you by your health care provider. Make sure you discuss any questions you have with your health care provider.

## 2014-10-13 NOTE — ED Notes (Signed)
Pt reports to the ED for eval of nausea, abd pain, and bloating since Friday. She reports she initially had diarrhea and now reports constipation. Last BM yesterday. Denies any active vomiting, fevers, chills, or urinary symptoms. Reports today she developed low back pain. Denies any numbness, tingling, paralysis, or bowel or bladder incontinence. Pt A&Ox4, resp e/u, and skin warm and dry

## 2014-10-13 NOTE — ED Notes (Signed)
Pt stable, ambulatory, states understanding of discharge instructions 

## 2014-10-13 NOTE — ED Provider Notes (Signed)
CSN: [id number]     Arrival date & time 10/13/14  1153 History   First MD Initiated Contact with Patient 10/13/14 1221     Chief Complaint  Patient presents with  . Abdominal Pain  . Back Pain     (Consider location/radiation/quality/duration/timing/severity/associated sxs/prior Treatment) HPI Comments: 64 year old female presenting with gradually worsening abdominal pain beginning yesterday. Pain is generalized throughout her abdomen, more so across the lower aspect and radiating around both sides to her back. Pain is constant and cramping rated 10/10 with associated nausea. No vomiting. Had a loose bowel movement this morning and she noticed a strange color in the toilet that may have been red. Over the past few days she's been constipated and straining to move her bowels. No pain with bowel movement. Denies fever, chills or urinary symptoms.  Patient is a 64 y.o. female presenting with abdominal pain and back pain. The history is provided by the patient.  Abdominal Pain Associated symptoms: constipation, diarrhea and nausea   Back Pain Associated symptoms: abdominal pain     Past Medical History  Diagnosis Date  . Hypertension   . Aneurysm   . Hyperthyroidism   . Hyperlipidemia   . Carotid artery occlusion   . Ulcer of the stomach and intestine   . Arthritis   . DVT (deep venous thrombosis)   . Fall at home May 30, 2014    Pt slipped in tub- hurt left hip and right shoulder   Past Surgical History  Procedure Laterality Date  . Brain surgery      Per patient " Left side behind eyes  . Aneurysm coiling  July 26, 2010    stent  . Lower extremity angiogram Left 10-04-12    and Left iliac stent  . Leg surgery    . Tubal ligation    . Lower extremity angiogram N/A 10/04/2012    Procedure: LOWER EXTREMITY ANGIOGRAM;  Surgeon: Jettie Booze, MD;  Location: [facility] CATH LAB;  Service: Cardiovascular;  Laterality: N/A;  . Insertion of iliac stent  10/04/2012    Procedure:  INSERTION OF ILIAC STENT;  Surgeon: Jettie Booze, MD;  Location: [facility] CATH LAB;  Service: Cardiovascular;;  Left External Iliac Artery   Family History  Problem Relation Age of Onset  . Deep vein thrombosis Mother   . Diabetes Mother     Amputation  . Heart disease Mother     Heart Disease before age 49  . Hyperlipidemia Mother   . Hypertension Mother   . Heart attack Mother   . Stroke Mother   . Heart disease Father     Heart Disease before age 59  . Hypertension Father   . Heart attack Father   . Hyperlipidemia Father   . Diabetes Brother   . Heart disease Brother     Heart Disease before age 11  . Hyperlipidemia Brother   . Hypertension Brother   . Heart attack Brother   . COPD Brother   . Diabetes Brother   . Heart disease Brother     Heart Disease before age 73   Social History  Substance Use Topics  . Smoking status: Light Tobacco Smoker -- 0.50 packs/day  . Smokeless tobacco: Never Used  . Alcohol Use: No   OB History    Gravida Para Term Preterm AB TAB SAB Ectopic Multiple Living   2 2 2       2      Review of Systems  Gastrointestinal: Positive for  nausea, abdominal pain, diarrhea, constipation, blood in stool and abdominal distention.  Musculoskeletal: Positive for back pain.  All other systems reviewed and are negative.     Allergies  Codeine; Darvon; and Flagyl  Home Medications   Prior to Admission medications   Medication Sig Start Date End Date Taking? Authorizing Provider  acetaminophen (TYLENOL) 500 MG tablet Take 1,000 mg by mouth 2 (two) times daily as needed for pain.     Historical Provider, MD  aspirin EC 81 MG tablet Take 81 mg by mouth daily.    Historical Provider, MD  buPROPion (WELLBUTRIN SR) 150 MG 12 hr tablet Take 150 mg by mouth 2 (two) times daily.    Historical Provider, MD  Calcium-Vitamin D (CALTRATE 600 PLUS-VIT D PO) Take by mouth.    Historical Provider, MD  Cholecalciferol (VITAMIN D) 2000 UNITS CAPS Take 4,000  Units by mouth daily.    Historical Provider, MD  ciprofloxacin (CIPRO) 500 MG tablet Take 1 tablet (500 mg total) by mouth 2 (two) times daily. One po bid x 7 days 10/13/14   Carman Ching, PA-C  clopidogrel (PLAVIX) 75 MG tablet Take 1 tablet (75 mg total) by mouth daily with breakfast. 10/05/12   Jettie Booze, MD  levothyroxine (SYNTHROID, LEVOTHROID) 25 MCG tablet Take 25 mcg by mouth daily before breakfast.     Historical Provider, MD  losartan-hydrochlorothiazide (HYZAAR) 100-25 MG per tablet Take 1 tablet by mouth daily.    Historical Provider, MD  metroNIDAZOLE (FLAGYL) 500 MG tablet Take 1 tablet (500 mg total) by mouth 2 (two) times daily. One po bid x 7 days 10/13/14   Carman Ching, PA-C  Multiple Vitamin (MULTIVITAMIN WITH MINERALS) TABS tablet Take 1 tablet by mouth daily. Centrum Silver    Historical Provider, MD  ondansetron (ZOFRAN ODT) 4 MG disintegrating tablet 4mg  ODT q4 hours prn nausea/vomit 10/13/14   Robyn M Hess, PA-C  pantoprazole (PROTONIX) 40 MG tablet Take 40 mg by mouth daily.    Historical Provider, MD  potassium gluconate 595 MG TABS Take 595 mg by mouth daily.    Historical Provider, MD  pyridOXINE (VITAMIN B-6) 100 MG tablet Take 100 mg by mouth daily.    Historical Provider, MD  simvastatin (ZOCOR) 40 MG tablet Take 40 mg by mouth daily with supper.     Historical Provider, MD  traMADol (ULTRAM) 50 MG tablet Take 50 mg by mouth at bedtime as needed for pain.    Historical Provider, MD  vitamin B-12 (CYANOCOBALAMIN) 500 MCG tablet Take 500 mcg by mouth daily.    Historical Provider, MD   BP 167/79 mmHg  Pulse 57  Temp(Src) 98.1 F (36.7 C) (Oral)  Resp 17  Ht 5\' 2"  (1.575 m)  Wt 162 lb 14.4 oz (73.891 kg)  BMI 29.79 kg/m2  SpO2 100% Physical Exam  Constitutional: She is oriented to person, place, and time. She appears well-developed and well-nourished. No distress.  HENT:  Head: Normocephalic and atraumatic.  Mouth/Throat: Oropharynx is clear and moist.   Eyes: Conjunctivae and EOM are normal.  Neck: Normal range of motion. Neck supple.  Cardiovascular: Normal rate, regular rhythm and normal heart sounds.   Pulmonary/Chest: Effort normal and breath sounds normal. No respiratory distress.  Abdominal: Soft. Bowel sounds are normal. She exhibits distension. There is generalized tenderness. There is no rigidity, no rebound, no guarding and no CVA tenderness.  No peritoneal signs.  Genitourinary: Rectal exam shows no external hemorrhoid, no fissure, no mass  and no tenderness. Guaiac negative stool.  No gross blood on exam glove.  Musculoskeletal: Normal range of motion. She exhibits no edema.  Neurological: She is alert and oriented to person, place, and time. No sensory deficit.  Skin: Skin is warm and dry.  Psychiatric: She has a normal mood and affect. Her behavior is normal.  Nursing note and vitals reviewed.   ED Course  Procedures (including critical care time) Labs Review Labs Reviewed  COMPREHENSIVE METABOLIC PANEL - Abnormal; Notable for the following:    Glucose, Bld 105 (*)    Creatinine, Ser 1.31 (*)    Albumin 3.4 (*)    GFR calc non Af Amer 42 (*)    GFR calc Af Amer 49 (*)    All other components within normal limits  URINALYSIS, ROUTINE W REFLEX MICROSCOPIC (NOT AT [facility]) - Abnormal; Notable for the following:    Bilirubin Urine SMALL (*)    All other components within normal limits  LIPASE, BLOOD  CBC  POC OCCULT BLOOD, ED    Imaging Review Ct Abdomen Pelvis W Contrast  10/13/2014   CLINICAL DATA:  64 year old female with generalized abdominal pain, nausea and constipation since 10/10/2014.  EXAM: CT ABDOMEN AND PELVIS WITH CONTRAST  TECHNIQUE: Multidetector CT imaging of the abdomen and pelvis was performed using the standard protocol following bolus administration of intravenous contrast.  CONTRAST:  142mL OMNIPAQUE IOHEXOL 300 MG/ML  SOLN  COMPARISON:  No priors.  Toggle  FINDINGS: Lower chest: Atherosclerotic  calcification in the left circumflex coronary artery.  Hepatobiliary: No cystic or solid hepatic lesions. No intra or extrahepatic biliary ductal dilatation. Gallbladder is normal in appearance.  Pancreas: No pancreatic mass. No pancreatic ductal dilatation. No pancreatic or peripancreatic fluid or inflammatory changes.  Spleen: Unremarkable.  Adrenals/Urinary Tract: The appearance of the adrenal glands and kidneys is normal bilaterally. No hydroureteronephrosis. Urinary bladder is normal in appearance.  Stomach/Bowel: The appearance of the stomach is normal. No pathologic dilatation of small bowel or colon. Numerous colonic diverticulae are noted, without surrounding inflammatory changes to suggest an acute diverticulitis at this time. There is mass-like thickening throughout the ascending colon. Subtle inflammatory changes are noted in the associated mesocolon. Several prominent but nonenlarged lymph nodes are noted surrounding this region.  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 No definite lymphadenopathy noted in the abdomen or pelvis.  Reproductive: Uterus and ovaries are atrophic.  Other: No significant volume of ascites.  No pneumoperitoneum.  Musculoskeletal: There are no aggressive appearing lytic or blastic lesions noted in the visualized portions of the skeleton.  IMPRESSION: 1. Mass-like thickening of the ascending colon. This is most concerning for potential neoplasm. Alternatively, this could represent a severe focal colitis. Correlation with colonoscopy is suggested to exclude neoplasm. 2. No other potential acute findings noted in the abdomen or pelvis. 3. Extensive atherosclerosis, including focal fusiform ectasia of the infrarenal abdominal aorta where there is a short segment dissection.  This does not appear acute, and does not propagate. 4. Colonic diverticulosis without evidence of acute diverticulitis at this time. 5. Additional incidental findings, as above.   Electronically Signed   By: Vinnie Langton M.D.   On: 10/13/2014 14:56   I have personally reviewed and evaluated these images and lab results as part of my medical decision-making.   EKG  Interpretation None      MDM   Final diagnoses:  Colitis  Colonic mass  Abdominal distension   Nontoxic appearing, NAD. Vital signs stable. Afebrile. Abdomen is soft with no peritoneal signs. Has generalized tenderness and distention. Labs significant for creatinine of 1.31, increase from 1.1 two moths ago, no other acute findings.  CT obtained for further evaluation into the distention and her reported abnormal bowel movements along with pain radiating around to her back. CT with results as stated above. Spoke with Dr. Scot Dock with vascular surgery regarding the noted dissection that does not appear acute, he took a look at the CT and states there is no acute concern for this at this time. I discussed the potential mass with patient along with colitis and will start the patient on Cipro and Flagyl, have her follow-up with her PCP and gastroenterology. She has an appointment next month with Dr. Hilarie Fredrickson GI, I advised her to try to call and schedule an earlier follow-up appointment. Her vitals have remained stable throughout this entire visit, abdomen remains soft, no associated vomiting, tolerating PO, stable for discharge. Return precautions given. Patient states understanding of treatment care plan and is agreeable.   Carman Ching, PA-C 10/13/14 [place], MD 10/13/14 [phone]

## 2014-10-16 ENCOUNTER — Ambulatory Visit: Payer: BC Managed Care – PPO | Admitting: Physician Assistant

## 2014-10-20 ENCOUNTER — Other Ambulatory Visit (INDEPENDENT_AMBULATORY_CARE_PROVIDER_SITE_OTHER): Payer: BC Managed Care – PPO

## 2014-10-20 ENCOUNTER — Ambulatory Visit (INDEPENDENT_AMBULATORY_CARE_PROVIDER_SITE_OTHER): Payer: BC Managed Care – PPO | Admitting: Physician Assistant

## 2014-10-20 ENCOUNTER — Encounter: Payer: Self-pay | Admitting: Physician Assistant

## 2014-10-20 VITALS — BP 166/76 | HR 80 | Ht 61.5 in | Wt 166.1 lb

## 2014-10-20 DIAGNOSIS — K2270 Barrett's esophagus without dysplasia: Secondary | ICD-10-CM | POA: Diagnosis not present

## 2014-10-20 DIAGNOSIS — R195 Other fecal abnormalities: Secondary | ICD-10-CM | POA: Diagnosis not present

## 2014-10-20 DIAGNOSIS — K219 Gastro-esophageal reflux disease without esophagitis: Secondary | ICD-10-CM | POA: Diagnosis not present

## 2014-10-20 DIAGNOSIS — K279 Peptic ulcer, site unspecified, unspecified as acute or chronic, without hemorrhage or perforation: Secondary | ICD-10-CM

## 2014-10-20 DIAGNOSIS — Z7901 Long term (current) use of anticoagulants: Secondary | ICD-10-CM

## 2014-10-20 DIAGNOSIS — R197 Diarrhea, unspecified: Secondary | ICD-10-CM

## 2014-10-20 DIAGNOSIS — R935 Abnormal findings on diagnostic imaging of other abdominal regions, including retroperitoneum: Secondary | ICD-10-CM

## 2014-10-20 DIAGNOSIS — Z86010 Personal history of colonic polyps: Secondary | ICD-10-CM

## 2014-10-20 LAB — CBC WITH DIFFERENTIAL/PLATELET
Basophils Absolute: 0 10*3/uL (ref 0.0–0.1)
Basophils Relative: 0.4 % (ref 0.0–3.0)
Eosinophils Absolute: 0 10*3/uL (ref 0.0–0.7)
Eosinophils Relative: 0 % (ref 0.0–5.0)
HCT: 44.3 % (ref 36.0–46.0)
Hemoglobin: 14.8 g/dL (ref 12.0–15.0)
Lymphocytes Relative: 29.1 % (ref 12.0–46.0)
Lymphs Abs: 1.8 10*3/uL (ref 0.7–4.0)
MCHC: 33.4 g/dL (ref 30.0–36.0)
MCV: 86.3 fl (ref 78.0–100.0)
Monocytes Absolute: 0.5 10*3/uL (ref 0.1–1.0)
Monocytes Relative: 7.4 % (ref 3.0–12.0)
Neutro Abs: 3.9 10*3/uL (ref 1.4–7.7)
Neutrophils Relative %: 63.1 % (ref 43.0–77.0)
Platelets: 243 10*3/uL (ref 150.0–400.0)
RBC: 5.13 Mil/uL — ABNORMAL HIGH (ref 3.87–5.11)
RDW: 15.2 % (ref 11.5–15.5)
WBC: 6.2 10*3/uL (ref 4.0–10.5)

## 2014-10-20 MED ORDER — NA SULFATE-K SULFATE-MG SULF 17.5-3.13-1.6 GM/177ML PO SOLN: ORAL | Status: DC

## 2014-10-20 NOTE — Patient Instructions (Addendum)
You have been scheduled for an endoscopy and colonoscopy. Please follow the written instructions given to you at your visit today. Please pick up your prep supplies at the pharmacy within the next 1-3 days. If you use inhalers (even only as needed), please bring them with you on the day of your procedure. Your physician has requested that you go to www.startemmi.com and enter the access code given to you at your visit today. This web site gives a general overview about your procedure. However, you should still follow specific instructions given to you by our office regarding your preparation for the procedure.  We have sent medications to your pharmacy for you to pick up at your convenience.   Your physician has requested that you go to the basement for lab work before leaving today.

## 2014-10-21 ENCOUNTER — Telehealth: Payer: Self-pay

## 2014-10-21 ENCOUNTER — Telehealth: Payer: Self-pay | Admitting: *Deleted

## 2014-10-21 ENCOUNTER — Encounter: Payer: Self-pay | Admitting: Physician Assistant

## 2014-10-21 NOTE — Telephone Encounter (Signed)
10/21/2014   RE: Vicki Page DOB: [birth year] MRN: [id number]   Dear Dr. Kenton Kingfisher,    We have scheduled the above patient for an endoscopic procedure. Our records show that she is on anticoagulation therapy.   Please advise as to how long the patient may come off her therapy of Clopidogrel prior to the procedure, which is scheduled for 12/09/2014.  Please fax back/ or route the completed form to Amber at [phone].   Sincerely,    Margreta Journey

## 2014-10-21 NOTE — Telephone Encounter (Signed)
10/21/2014   RE: NAMI STRAWDER DOB: 1950-06-14 MRN: [id number]   Dear Dr. Irish Lack,    We have scheduled the above patient for an endoscopic procedure. Our records show that she is on anticoagulation therapy.   Please advise as to how long the patient may come off her therapy of Clopidogrel prior to the procedure, which is scheduled for 12/17/2014.  Please fax back/ or route the completed form to Amber at [phone].   Sincerely,    Margreta Journey

## 2014-10-21 NOTE — Progress Notes (Addendum)
Patient ID: Vicki Page, female   DOB: 03-May-1950, 64 y.o.   MRN: [id number]    HPI:  Vicki Page is a 64 y.o.   female  referred by Shirline Frees, MD abdominal pain, nausea, and abnormal findings of colitis on recent CT. She has a past history of hypertension, bilateral carotid stenosis. She had a stent placed in an intracranial cerebral aneurysm in 2012. She had a left leg first. A stent placed in August 2014. She has osteoarthritis in both knees, hyperthyroidism, hyperlipidemia, history of DVT, history of ulcers, Barrett's esophagus, and adenomatous colonic polyps. She has been followed by Dr. Ferdinand Lango of [facility] gastroenterology in the past. She had an EGD on 11/28/2013 that showed esophagitis, gastritis, and a 7 mm ulcer in the gastric antrum. CLOtest results were negative. Pathology was consistent with Barrett's esophagus. She was advised to have a repeat endoscopy in 1 year. She also has a history of adenomatous colon polyps. Her last colonoscopy was on 08/14/2013 at which time 5 hyperplastic polyps were removed. Her prep was described as adequate. She was advised to have surveillance in 5 years. She presented to the emergency room with abdominal pain of 2 days' duration on 10/13/2014. The pain was described as generalized throughout her abdomen, but more severe across the lower abdomen radiating around to both sides of her back. Her pain was constant and described as cramping. She was nauseous but had not vomited. She had had several loose bowel movements and thought she saw some blood in the toilet tissue. For several days prior to that she felt as if she had been constipated and was straining to move her bowels. CT of the abdomen and pelvis revealed mass like thickening of the ascending colon. This is most concerning for potential neoplasm. Alternatively, this could represent a severe focal colitis. Correlation with colonoscopy is suggested to exclude neoplasm. Extensive atherosclerosis including  focal fusiform ectasia of the infrarenal abdominal aorta where there is a short segment dissection. This does not appear acute, and does not propagate. Colonic diverticulosis without evidence of acute diverticulitis at this time. Patient was given a 7 day course of Cipro and Flagyl and was advised to follow-up.Marland Kitchen She has less abdominal pain but feels very bloated and is having 10-20 small mushy formed bowel movements daily. She intermittently has had black stools. She states her rectum is very sore when she wipes. She has been experiencing epigastric pain for the past year. She states the pain is worse after she eats. She has occasional nausea but no vomiting. She has been on Protonix which she feels helps. She denies use of non-steroidal anti-inflammatory's. She had an abdominal ultrasound on 11/08/2013 that showed a normal gallbladder and a common bile duct measuring 0.2 cm no intrahepatic biliary dilatation. She had a CT of the abdomen and pelvis on 11/07/2013 that showed no evidence of appendicitis, diverticulitis, IBD, hernia or bowel obstruction. Origin of the celiac access, SMA, and renal arteries was unremarkable. She had a pelvic ultrasound at [facility] as well that showed nonvisualization of endometrial strips and ovaries, otherwise negative study. She is here today because she states she has "had it" with her epigastric discomfort and loose stools.   Past Medical History  Diagnosis Date  . Hypertension   . Aneurysm     brain  . Hyperthyroidism   . Hyperlipidemia   . Carotid artery occlusion   . Ulcer of the stomach and intestine   . Arthritis   . DVT (  deep venous thrombosis)   . Fall at home May 30, 2014    Pt slipped in tub- hurt left hip and right shoulder  . Adenomatous colon polyp   . Barrett's esophagus   . Anxiety   . IBS (irritable bowel syndrome)   . GERD (gastroesophageal reflux disease)     Past Surgical History  Procedure Laterality Date  . Brain surgery       Per patient " Left side behind eyes  . Aneurysm coiling  July 26, 2010    stent  . Lower extremity angiogram Left 10-04-12    and Left iliac stent  . Leg surgery    . Tubal ligation    . Lower extremity angiogram N/A 10/04/2012    Procedure: LOWER EXTREMITY ANGIOGRAM;  Surgeon: Jettie Booze, MD;  Location: [facility] CATH LAB;  Service: Cardiovascular;  Laterality: N/A;  . Insertion of iliac stent  10/04/2012    Procedure: INSERTION OF ILIAC STENT;  Surgeon: Jettie Booze, MD;  Location: [facility] CATH LAB;  Service: Cardiovascular;;  Left External Iliac Artery   Family History  Problem Relation Age of Onset  . Deep vein thrombosis Mother   . Diabetes Mother     Amputation  . Heart disease Mother     Heart Disease before age 51  . Hyperlipidemia Mother   . Hypertension Mother   . Heart attack Mother   . Stroke Mother   . Heart disease Father     Heart Disease before age 27  . Hypertension Father   . Heart attack Father   . Hyperlipidemia Father   . Diabetes Brother   . Heart disease Brother     Heart Disease before age 18  . Hyperlipidemia Brother   . Hypertension Brother   . Heart attack Brother   . COPD Brother   . Thyroid disease Mother    Social History  Substance Use Topics  . Smoking status: Light Tobacco Smoker -- 0.50 packs/day    Types: Cigarettes  . Smokeless tobacco: Never Used  . Alcohol Use: No   Current Outpatient Prescriptions  Medication Sig Dispense Refill  . acetaminophen (TYLENOL) 500 MG tablet Take 1,000 mg by mouth 2 (two) times daily as needed for pain.     Marland Kitchen aspirin EC 81 MG tablet Take 81 mg by mouth daily.    Marland Kitchen buPROPion (WELLBUTRIN SR) 150 MG 12 hr tablet Take 150 mg by mouth 2 (two) times daily.    . Calcium-Vitamin D (CALTRATE 600 PLUS-VIT D PO) Take by mouth.    . Cholecalciferol (VITAMIN D) 2000 UNITS CAPS Take 4,000 Units by mouth daily.    . ciprofloxacin (CIPRO) 500 MG tablet Take 1 tablet (500 mg total) by mouth 2 (two) times daily.  One po bid x 7 days 14 tablet 0  . clopidogrel (PLAVIX) 75 MG tablet Take 1 tablet (75 mg total) by mouth daily with breakfast. 30 tablet 2  . levothyroxine (SYNTHROID, LEVOTHROID) 25 MCG tablet Take 25 mcg by mouth daily before breakfast.     . losartan-hydrochlorothiazide (HYZAAR) 100-25 MG per tablet Take 1 tablet by mouth daily.    . metroNIDAZOLE (FLAGYL) 500 MG tablet Take 1 tablet (500 mg total) by mouth 2 (two) times daily. One po bid x 7 days 14 tablet 0  . Multiple Vitamin (MULTIVITAMIN WITH MINERALS) TABS tablet Take 1 tablet by mouth daily. Centrum Silver    . ondansetron (ZOFRAN ODT) 4 MG disintegrating tablet 4mg  ODT q4 hours  prn nausea/vomit 10 tablet 0  . pantoprazole (PROTONIX) 40 MG tablet Take 40 mg by mouth daily.    Marland Kitchen pyridOXINE (VITAMIN B-6) 100 MG tablet Take 100 mg by mouth daily.    Marland Kitchen rOPINIRole (REQUIP) 0.5 MG tablet Take 1 tablet by mouth daily.    . simvastatin (ZOCOR) 40 MG tablet Take 40 mg by mouth daily with supper.     . vitamin B-12 (CYANOCOBALAMIN) 500 MCG tablet Take 500 mcg by mouth daily.    . Na Sulfate-K Sulfate-Mg Sulf SOLN Use as directed per colonoscopy. 354 mL 0  . potassium gluconate 595 MG TABS Take 595 mg by mouth daily.     No current facility-administered medications for this visit.   Allergies  Allergen Reactions  . Codeine Nausea And Vomiting  . Darvon [Propoxyphene Hcl] Nausea And Vomiting  . Flagyl [Metronidazole] Nausea And Vomiting     Review of Systems: Per history of present illness otherwise negative.  Studies: Ct Abdomen Pelvis W Contrast  10/13/2014   CLINICAL DATA:  64 year old female with generalized abdominal pain, nausea and constipation since 10/10/2014.  EXAM: CT ABDOMEN AND PELVIS WITH CONTRAST  TECHNIQUE: Multidetector CT imaging of the abdomen and pelvis was performed using the standard protocol following bolus administration of intravenous contrast.  CONTRAST:  130mL OMNIPAQUE IOHEXOL 300 MG/ML  SOLN  COMPARISON:  No  priors.  Toggle  FINDINGS: Lower chest: Atherosclerotic calcification in the left circumflex coronary artery.  Hepatobiliary: No cystic or solid hepatic lesions. No intra or extrahepatic biliary ductal dilatation. Gallbladder is normal in appearance.  Pancreas: No pancreatic mass. No pancreatic ductal dilatation. No pancreatic or peripancreatic fluid or inflammatory changes.  Spleen: Unremarkable.  Adrenals/Urinary Tract: The appearance of the adrenal glands and kidneys is normal bilaterally. No hydroureteronephrosis. Urinary bladder is normal in appearance.  Stomach/Bowel: The appearance of the stomach is normal. No pathologic dilatation of small bowel or colon. Numerous colonic diverticulae are noted, without surrounding inflammatory changes to suggest an acute diverticulitis at this time. There is mass-like thickening throughout the ascending colon. Subtle inflammatory changes are noted in the associated mesocolon. Several prominent but nonenlarged lymph nodes are noted surrounding this region.  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 No definite lymphadenopathy noted in the abdomen or pelvis.  Reproductive: Uterus and ovaries are atrophic.  Other: No significant volume of ascites.  No pneumoperitoneum.  Musculoskeletal: There are no aggressive appearing lytic or blastic lesions noted in the visualized portions of the skeleton.  IMPRESSION: 1. Mass-like thickening of the ascending colon. This is most concerning for potential neoplasm. Alternatively, this could represent a severe focal colitis. Correlation with colonoscopy is suggested to exclude neoplasm. 2. No other potential acute findings noted in the abdomen or pelvis. 3. Extensive atherosclerosis, including focal fusiform ectasia of the infrarenal  abdominal aorta where there is a short segment dissection. This does not appear acute, and does not propagate. 4. Colonic diverticulosis without evidence of acute diverticulitis at this time. 5. Additional incidental findings, as above.   Electronically Signed   By: Vinnie Langton M.D.   On: 10/13/2014 14:56    LAB RESULTS: CBC on 10/13/2014 white count 5.2, hemoglobin 14.1, hematocrit 43.4, platelets 213,000.  Prior Endoscopies:   See history of present illness  Physical Exam: BP 166/76 mmHg  Pulse 80  Ht 5' 1.5" (1.562 m)  Wt 166 lb 2 oz (75.354 kg)  BMI 30.88 kg/m2 Constitutional: Pleasant,well-developed, African-American female in no acute distress. HEENT: Normocephalic and atraumatic. Conjunctivae are normal. No scleral icterus. Neck supple. No JVD Cardiovascular: Normal rate, regular rhythm.  Pulmonary/chest: Effort normal and breath sounds normal. No wheezing, rales or rhonchi. Abdominal: Soft, nondistended, nontender. Bowel sounds active throughout. There are no masses palpable. No hepatomegaly. Rectal: Brown stool, heme positive Extremities: no edema Lymphadenopathy: No cervical adenopathy noted. Neurological: Alert and oriented to person place and time. Skin: Skin is warm and dry. No rashes noted. Psychiatric: Normal mood and affect. Behavior is normal.  ASSESSMENT AND PLAN: #1. Epigastric pain. This may be due to recurrent gastritis or ulcers. She does have a history of Barrett's and was previously advised to have surveillance in 1 year. She is requesting repeat endoscopy to assess for healing of her ulcers. She will be scheduled for an EGD to evaluate for esophagitis, gastritis, ulcer, duodenitis, etc.The risks, benefits, and alternatives to endoscopy with possible biopsy and possible dilation were discussed with the patient and they consent to proceed.   #2. Abnormal colon on CT, diarrhea, and heme positive stools. Findings worrisome for colitis or neoplasm. Patient has  been advised to undergo colonoscopy to evaluate for the above.The risks, benefits, and alternatives to colonoscopy with possible biopsy and possible polypectomy were discussed with the patient and they consent to proceed. Hold plavix  5 days before procedure - will instruct when and how to resume after procedure. Risks and benefits of procedure including bleeding, perforation, infection, missed lesions, medication reactions and possible hospitalization or surgery if complications occur explained. Additional rare but real risk of cardiovascular event such as heart attack or ischemia/infarct of other organs off plavixexplained and need to seek urgent help if this occurs. Will communicate by phone or EMR with patient's prescribing provider that to confirm holding plavix is reasonable in this case. Will obtain C. difficile PCR and CBC today.    Hvozdovic, Deloris Ping 10/21/2014, 8:36 AM  CC: Shirline Frees, MD   Addendum: Reviewed and agree with initial management. Jerene Bears, MD

## 2014-10-21 NOTE — Telephone Encounter (Signed)
Per Cecille Rubin Hvozdovic, PA-C, she would like patient to have endoscopy/colonoscopy before 12/17/14. 12/17/14 was originally the first available [facility] spot. Since that time, 11/04/14 has become available. I have spoken to patient and she would like to come on this date. Appointment has been rescheduled and instructions updated. These have been mailed to patient. I have also advised that we will be in touch with her regarding Plavix clearance from Dr Kenton Kingfisher.

## 2014-10-27 NOTE — Telephone Encounter (Signed)
Dr Kenton Kingfisher has sent a faxed response that patient may hold plavix 5 days prior to procedure. I have spoken to patient to advise of Dr Kenton Kingfisher' recommendations and she verbalizes understanding. See faxed note under "media."

## 2014-10-31 NOTE — Telephone Encounter (Signed)
Agree that she can hold the plavix 5 days prior.

## 2014-11-04 ENCOUNTER — Encounter: Payer: Self-pay | Admitting: Internal Medicine

## 2014-11-04 ENCOUNTER — Ambulatory Visit (AMBULATORY_SURGERY_CENTER): Payer: BC Managed Care – PPO | Admitting: Internal Medicine

## 2014-11-04 VITALS — BP 151/74 | HR 57 | Temp 98.6°F | Resp 18 | Ht 61.0 in | Wt 166.0 lb

## 2014-11-04 DIAGNOSIS — K2270 Barrett's esophagus without dysplasia: Secondary | ICD-10-CM

## 2014-11-04 DIAGNOSIS — R1013 Epigastric pain: Secondary | ICD-10-CM

## 2014-11-04 DIAGNOSIS — R935 Abnormal findings on diagnostic imaging of other abdominal regions, including retroperitoneum: Secondary | ICD-10-CM

## 2014-11-04 DIAGNOSIS — D125 Benign neoplasm of sigmoid colon: Secondary | ICD-10-CM

## 2014-11-04 DIAGNOSIS — K219 Gastro-esophageal reflux disease without esophagitis: Secondary | ICD-10-CM | POA: Diagnosis not present

## 2014-11-04 MED ORDER — SODIUM CHLORIDE 0.9 % IV SOLN: 500.0000 mL | INTRAVENOUS | Status: DC

## 2014-11-04 NOTE — Op Note (Signed)
[place]  [address] [name], [id number]   COLONOSCOPY PROCEDURE REPORT  PATIENT: Vicki Page, Vicki Page  MR#: [id number] BIRTHDATE: [birth year] , 20  yrs. old GENDER: female ENDOSCOPIST: Jerene Bears, MD PROCEDURE DATE:  11/04/2014 PROCEDURE:   Colonoscopy, diagnostic, Colonoscopy with biopsy, and Colonoscopy with cold biopsy polypectomy First Screening Colonoscopy - Avg.  risk and is 50 yrs.  old or older - No.  Prior Negative Screening - Now for repeat screening. N/A  History of Adenoma - Now for follow-up colonoscopy & has been > or = to 3 yrs.  Yes hx of adenoma.  Has been 3 or more years since last colonoscopy.  Polyps removed today? Yes ASA CLASS:   Class IV INDICATIONS:abdominal pain, an abnormal CT, and history of colon polyps. MEDICATIONS: Propofol 325 mg IV, this was the total dose used for all procedures at this session, and Monitored anesthesia care  DESCRIPTION OF PROCEDURE:   After the risks benefits and alternatives of the procedure were thoroughly explained, informed consent was obtained.  The digital rectal exam revealed no rectal mass.   The LB [id number]  endoscope was introduced through the anus and advanced to the cecum, which was identified by both the appendix and ileocecal valve. No adverse events experienced. The quality of the prep was good.  (Suprep was used)  The instrument was then slowly withdrawn as the colon was fully examined. Estimated blood loss is zero unless otherwise noted in this procedure report.  COLON FINDINGS: Area of resolving inflammation in the ascending colon, most noticeable one fold distal to the IC valve.  This area was biopsied extensively, query resolving ischemic insult.   There was moderate diverticulosis noted in the transverse colon and left colon.   A sessile polyp measuring 5 mm in size was found in the sigmoid colon.  A polypectomy was performed with cold forceps.  The resection was complete, the  polyp tissue was completely retrieved and sent to histology.  Retroflexed views revealed hypertrophied anal papilla. The time to cecum = 1.5 Withdrawal time = 13.5   The scope was withdrawn and the procedure completed. COMPLICATIONS: There were no immediate complications.  ENDOSCOPIC IMPRESSION: 1.   Area of resolving inflammation in the ascending colon, most noticeable one fold distal to the IC valve.  This area was biopsied extensively, query resolving ischemic insult 2.   Moderate diverticulosis was noted in the transverse colon and left colon 3.   Sessile polyp was found in the sigmoid colon; polypectomy was performed with cold forceps  RECOMMENDATIONS: 1.  Await pathology results 2.  Avoid all NSAIDs 3.  High fiber diet 4.  Timing of repeat colonoscopy will be determined by pathology findings. 5.  You will receive a letter within 1-2 weeks with the results of your biopsy as well as final recommendations.  Please call my office if you have not received a letter after 3 weeks.  eSigned:  Jerene Bears, MD 11/04/2014 3:00 PM cc:  the patient, PCP

## 2014-11-04 NOTE — Patient Instructions (Signed)
YOU HAD AN ENDOSCOPIC PROCEDURE TODAY AT [facility]:   Refer to the procedure report that was given to you for any specific questions about what was found during the examination.  If the procedure report does not answer your questions, please call your gastroenterologist to clarify.  If you requested that your care partner not be given the details of your procedure findings, then the procedure report has been included in a sealed envelope for you to review at your convenience later.  YOU SHOULD EXPECT: Some feelings of bloating in the abdomen. Passage of more gas than usual.  Walking can help get rid of the air that was put into your GI tract during the procedure and reduce the bloating. If you had a lower endoscopy (such as a colonoscopy or flexible sigmoidoscopy) you may notice spotting of blood in your stool or on the toilet paper. If you underwent a bowel prep for your procedure, you may not have a normal bowel movement for a few days.  Please Note:  You might notice some irritation and congestion in your nose or some drainage.  This is from the oxygen used during your procedure.  There is no need for concern and it should clear up in a day or so.  SYMPTOMS TO REPORT IMMEDIATELY:    Following upper endoscopy (EGD)  Vomiting of blood or coffee ground material  New chest pain or pain under the shoulder blades  Painful or persistently difficult swallowing  New shortness of breath  Fever of 100F or higher  Black, tarry-looking stools  For urgent or emergent issues, a gastroenterologist can be reached at any hour by calling [phone].   DIET: Your first meal following the procedure should be a small meal and then it is ok to progress to your normal diet. Heavy or fried foods are harder to digest and may make you feel nauseous or bloated.  Likewise, meals heavy in dairy and vegetables can increase bloating.  Drink plenty of fluids but you should avoid alcoholic beverages  for 24 hours.  ACTIVITY:  You should plan to take it easy for the rest of today and you should NOT DRIVE or use heavy machinery until tomorrow (because of the sedation medicines used during the test).    FOLLOW UP: Our staff will call the number listed on your records the next business day following your procedure to check on you and address any questions or concerns that you may have regarding the information given to you following your procedure. If we do not reach you, we will leave a message.  However, if you are feeling well and you are not experiencing any problems, there is no need to return our call.  We will assume that you have returned to your regular daily activities without incident.  If any biopsies were taken you will be contacted by phone or by letter within the next 1-3 weeks.  Please call us at [phone] if you have not heard about the biopsies in 3 weeks.    SIGNATURES/CONFIDENTIALITY: You and/or your care partner have signed paperwork which will be entered into your electronic medical record.  These signatures attest to the fact that that the information above on your After Visit Summary has been reviewed and is understood.  Full responsibility of the confidentiality of this discharge information lies with you and/or your care-partner.  Await pathology report Continue Protonix 40 mg daily Resume Coumadin today and have your PT/INR checked in 1 week

## 2014-11-04 NOTE — Progress Notes (Signed)
Called to room to assist during endoscopic procedure.  Patient ID and intended procedure confirmed with present staff. Received instructions for my participation in the procedure from the performing physician.  

## 2014-11-04 NOTE — Progress Notes (Signed)
Transferred to recovery room. A/O x3, pleased with MAC.  VSS.  Report to Jill, RN. 

## 2014-11-04 NOTE — Op Note (Signed)
[place]  [address] [place], [address]   ENDOSCOPY PROCEDURE REPORT  PATIENT: Vicki Page, Vicki Page  MR#: [id number] BIRTHDATE: 06/02/1950 , 85  yrs. old GENDER: female ENDOSCOPIST: Jerene Bears, MD REFERRED BY:  Shirline Frees, M.D. PROCEDURE DATE:  11/04/2014 PROCEDURE:  EGD w/ biopsy ASA CLASS:     Class III INDICATIONS:  epigastric pain and history of Barrett's esophagus. MEDICATIONS: Monitored anesthesia care and Propofol 150 mg IV TOPICAL ANESTHETIC: none  DESCRIPTION OF PROCEDURE: After the risks benefits and alternatives of the procedure were thoroughly explained, informed consent was obtained.  The LB [id number] endoscope was introduced through the mouth and advanced to the second portion of the duodenum , Without limitations.  The instrument was slowly withdrawn as the mucosa was fully examined.  ESOPHAGUS: There was a 1cm segment of suspected Barrett's esophagus found 37 cm from the incisors.  There was no nodular mucosa noted in the Barrett's segment.  Multiple biopsies were performed.   C0M1  STOMACH: The mucosa of the stomach appeared normal.  Cold forcep biopsies were taken at the gastric body, antrum and angularis to evaluate for h.  pylori.  DUODENUM: The duodenal mucosa showed no abnormalities in the bulb and 2nd part of the duodenum. Retroflexed views revealed no abnormalities.     The scope was then withdrawn from the patient and the procedure completed.  COMPLICATIONS: There were no immediate complications.  ENDOSCOPIC IMPRESSION: 1.   There was a 1cm segment of suspected Barrett's esophagus found 37 cm from the incisors; multiple biopsies were performed 2.   The mucosa of the stomach appeared normal; multiple biopsies 3.   The duodenal mucosa showed no abnormalities in the bulb and 2nd part of the duodenum  RECOMMENDATIONS: 1.  Await biopsy results 2.  Continue pantoprazole 40 mg daily 3.  Proceed with a  Colonoscopy.  eSigned:  Jerene Bears, MD 11/04/2014 2:53 PM  CC: the patient, PCP

## 2014-11-05 ENCOUNTER — Telehealth: Payer: Self-pay

## 2014-11-05 NOTE — Telephone Encounter (Signed)
  Follow up Call-  Call back number 11/04/2014  Post procedure Call Back phone  # [phone]  Permission to leave phone message Yes     Patient questions:  Do you have a fever, pain , or abdominal swelling? No. Pain Score  0 *  Have you tolerated food without any problems? Yes.    Have you been able to return to your normal activities? Yes.    Do you have any questions about your discharge instructions: Diet   No. Medications  No. Follow up visit  No.  Do you have questions or concerns about your Care? No.  Actions: * If pain score is 4 or above: No action needed, pain <4.

## 2014-11-07 ENCOUNTER — Telehealth: Payer: Self-pay | Admitting: Internal Medicine

## 2014-11-10 NOTE — Telephone Encounter (Signed)
Spoke with pt and let her know the path results are not back yet.

## 2014-11-11 ENCOUNTER — Encounter: Payer: Self-pay | Admitting: Internal Medicine

## 2014-11-19 ENCOUNTER — Telehealth: Payer: Self-pay | Admitting: Internal Medicine

## 2014-11-19 NOTE — Telephone Encounter (Signed)
Left message for pt to call back  °

## 2014-11-21 NOTE — Telephone Encounter (Signed)
Spoke with pt and she states she had about 4 bm's the other day and there was some blood on the tissue when she wiped. Discussed with pt this was most likely due to some rectal irritation. Suggested pt use moist wipes for several days and this should resolve. Pt also had questions regarding her path letters. Results discussed with pt and her questions were answered.

## 2014-11-26 ENCOUNTER — Ambulatory Visit: Payer: BC Managed Care – PPO | Admitting: Internal Medicine

## 2014-12-17 ENCOUNTER — Encounter: Payer: BC Managed Care – PPO | Admitting: Internal Medicine

## 2015-01-13 NOTE — Telephone Encounter (Signed)
Pt was seen on 11/04/14

## 2015-03-24 ENCOUNTER — Telehealth (HOSPITAL_COMMUNITY): Payer: Self-pay

## 2015-03-24 NOTE — Telephone Encounter (Signed)
Called to schedule f/u US Carotid. Left VM for pt to return call. AW

## 2015-03-31 ENCOUNTER — Other Ambulatory Visit (HOSPITAL_COMMUNITY): Payer: Self-pay | Admitting: Interventional Radiology

## 2015-03-31 DIAGNOSIS — I771 Stricture of artery: Secondary | ICD-10-CM

## 2015-04-14 ENCOUNTER — Ambulatory Visit (HOSPITAL_COMMUNITY)
Admission: RE | Admit: 2015-04-14 | Discharge: 2015-04-14 | Disposition: A | Discharge status: Home / Self Care | Payer: Medicare Other | Source: Ambulatory Visit | Attending: Interventional Radiology | Admitting: Interventional Radiology

## 2015-04-14 DIAGNOSIS — I771 Stricture of artery: Secondary | ICD-10-CM | POA: Insufficient documentation

## 2015-04-14 NOTE — Progress Notes (Signed)
*  PRELIMINARY RESULTS* Vascular Ultrasound Carotid Duplex (Doppler) has been completed.   There is no obvious evidence of hemodynamically significant internal carotid artery stenosis bilaterally. Vertebral arteries are patent with antegrade flow.  04/14/2015 4:25 PM Maudry Mayhew, RVT, RDCS, RDMS

## 2015-05-26 ENCOUNTER — Other Ambulatory Visit (HOSPITAL_COMMUNITY): Payer: Self-pay | Admitting: Interventional Radiology

## 2015-05-26 DIAGNOSIS — I729 Aneurysm of unspecified site: Secondary | ICD-10-CM

## 2015-05-27 ENCOUNTER — Encounter: Payer: Self-pay | Admitting: Internal Medicine

## 2015-06-25 ENCOUNTER — Other Ambulatory Visit: Payer: Self-pay | Admitting: Radiology

## 2015-06-26 ENCOUNTER — Other Ambulatory Visit (HOSPITAL_COMMUNITY): Payer: Self-pay | Admitting: Interventional Radiology

## 2015-06-26 ENCOUNTER — Ambulatory Visit (HOSPITAL_COMMUNITY)
Admission: RE | Admit: 2015-06-26 | Discharge: 2015-06-26 | Disposition: A | Discharge status: Home / Self Care | Payer: Medicare Other | Source: Ambulatory Visit | Attending: Interventional Radiology | Admitting: Interventional Radiology

## 2015-06-26 ENCOUNTER — Encounter (HOSPITAL_COMMUNITY): Payer: Self-pay

## 2015-06-26 DIAGNOSIS — I671 Cerebral aneurysm, nonruptured: Secondary | ICD-10-CM | POA: Insufficient documentation

## 2015-06-26 DIAGNOSIS — Z79899 Other long term (current) drug therapy: Secondary | ICD-10-CM | POA: Diagnosis not present

## 2015-06-26 DIAGNOSIS — Z8249 Family history of ischemic heart disease and other diseases of the circulatory system: Secondary | ICD-10-CM | POA: Insufficient documentation

## 2015-06-26 DIAGNOSIS — Z825 Family history of asthma and other chronic lower respiratory diseases: Secondary | ICD-10-CM | POA: Insufficient documentation

## 2015-06-26 DIAGNOSIS — Z823 Family history of stroke: Secondary | ICD-10-CM | POA: Insufficient documentation

## 2015-06-26 DIAGNOSIS — I10 Essential (primary) hypertension: Secondary | ICD-10-CM | POA: Diagnosis not present

## 2015-06-26 DIAGNOSIS — I6523 Occlusion and stenosis of bilateral carotid arteries: Secondary | ICD-10-CM | POA: Insufficient documentation

## 2015-06-26 DIAGNOSIS — Z8349 Family history of other endocrine, nutritional and metabolic diseases: Secondary | ICD-10-CM | POA: Insufficient documentation

## 2015-06-26 DIAGNOSIS — K589 Irritable bowel syndrome without diarrhea: Secondary | ICD-10-CM | POA: Diagnosis not present

## 2015-06-26 DIAGNOSIS — Z833 Family history of diabetes mellitus: Secondary | ICD-10-CM | POA: Insufficient documentation

## 2015-06-26 DIAGNOSIS — F17210 Nicotine dependence, cigarettes, uncomplicated: Secondary | ICD-10-CM | POA: Diagnosis not present

## 2015-06-26 DIAGNOSIS — Z86718 Personal history of other venous thrombosis and embolism: Secondary | ICD-10-CM | POA: Diagnosis not present

## 2015-06-26 DIAGNOSIS — Z888 Allergy status to other drugs, medicaments and biological substances status: Secondary | ICD-10-CM | POA: Diagnosis not present

## 2015-06-26 DIAGNOSIS — I6502 Occlusion and stenosis of left vertebral artery: Secondary | ICD-10-CM | POA: Insufficient documentation

## 2015-06-26 DIAGNOSIS — Z885 Allergy status to narcotic agent status: Secondary | ICD-10-CM | POA: Insufficient documentation

## 2015-06-26 DIAGNOSIS — E785 Hyperlipidemia, unspecified: Secondary | ICD-10-CM | POA: Insufficient documentation

## 2015-06-26 DIAGNOSIS — I729 Aneurysm of unspecified site: Secondary | ICD-10-CM

## 2015-06-26 DIAGNOSIS — K219 Gastro-esophageal reflux disease without esophagitis: Secondary | ICD-10-CM | POA: Diagnosis not present

## 2015-06-26 DIAGNOSIS — F419 Anxiety disorder, unspecified: Secondary | ICD-10-CM | POA: Diagnosis not present

## 2015-06-26 DIAGNOSIS — Z7902 Long term (current) use of antithrombotics/antiplatelets: Secondary | ICD-10-CM | POA: Insufficient documentation

## 2015-06-26 DIAGNOSIS — Z7982 Long term (current) use of aspirin: Secondary | ICD-10-CM | POA: Diagnosis not present

## 2015-06-26 DIAGNOSIS — I6602 Occlusion and stenosis of left middle cerebral artery: Secondary | ICD-10-CM | POA: Insufficient documentation

## 2015-06-26 LAB — PROTIME-INR
INR: 1.09 (ref 0.00–1.49)
Prothrombin Time: 14.3 seconds (ref 11.6–15.2)

## 2015-06-26 LAB — CBC
HCT: 44 % (ref 36.0–46.0)
Hemoglobin: 14.4 g/dL (ref 12.0–15.0)
MCH: 27.5 pg (ref 26.0–34.0)
MCHC: 32.7 g/dL (ref 30.0–36.0)
MCV: 84 fL (ref 78.0–100.0)
Platelets: 235 10*3/uL (ref 150–400)
RBC: 5.24 MIL/uL — ABNORMAL HIGH (ref 3.87–5.11)
RDW: 14.9 % (ref 11.5–15.5)
WBC: 4.2 10*3/uL (ref 4.0–10.5)

## 2015-06-26 LAB — BASIC METABOLIC PANEL
Anion gap: 8 (ref 5–15)
BUN: 13 mg/dL (ref 6–20)
CO2: 28 mmol/L (ref 22–32)
Calcium: 9.1 mg/dL (ref 8.9–10.3)
Chloride: 106 mmol/L (ref 101–111)
Creatinine, Ser: 1.19 mg/dL — ABNORMAL HIGH (ref 0.44–1.00)
GFR calc Af Amer: 54 mL/min — ABNORMAL LOW (ref >60)
GFR calc non Af Amer: 47 mL/min — ABNORMAL LOW (ref >60)
Glucose, Bld: 98 mg/dL (ref 65–99)
Potassium: 3.9 mmol/L (ref 3.5–5.1)
Sodium: 142 mmol/L (ref 135–145)

## 2015-06-26 LAB — APTT: aPTT: 29 seconds (ref 24–37)

## 2015-06-26 MED ORDER — IOPAMIDOL (ISOVUE-300) INJECTION 61%
INTRAVENOUS | Status: AC
  Filled 2015-06-26: qty 150

## 2015-06-26 MED ORDER — FENTANYL CITRATE (PF) 100 MCG/2ML IJ SOLN
INTRAMUSCULAR | Status: AC
  Filled 2015-06-26: qty 2

## 2015-06-26 MED ORDER — HEPARIN SODIUM (PORCINE) 1000 UNIT/ML IJ SOLN
INTRAMUSCULAR | Status: AC | PRN
  Administered 2015-06-26: 500 [IU] via INTRAVENOUS
  Administered 2015-06-26: 1000 [IU] via INTRAVENOUS

## 2015-06-26 MED ORDER — FENTANYL CITRATE (PF) 100 MCG/2ML IJ SOLN
INTRAMUSCULAR | Status: AC | PRN
  Administered 2015-06-26 (×2): 25 ug via INTRAVENOUS

## 2015-06-26 MED ORDER — HEPARIN SODIUM (PORCINE) 1000 UNIT/ML IJ SOLN
INTRAMUSCULAR | Status: AC
  Filled 2015-06-26: qty 1

## 2015-06-26 MED ORDER — LIDOCAINE HCL 1 % IJ SOLN
INTRAMUSCULAR | Status: AC
  Filled 2015-06-26: qty 20

## 2015-06-26 MED ORDER — SODIUM CHLORIDE 0.9 % IV SOLN
INTRAVENOUS | Status: AC
Start: 2015-06-26 — End: 2015-06-26

## 2015-06-26 MED ORDER — MIDAZOLAM HCL 2 MG/2ML IJ SOLN
INTRAMUSCULAR | Status: AC | PRN
  Administered 2015-06-26: 1 mg via INTRAVENOUS

## 2015-06-26 MED ORDER — MIDAZOLAM HCL 2 MG/2ML IJ SOLN
INTRAMUSCULAR | Status: AC
  Filled 2015-06-26: qty 2

## 2015-06-26 MED ORDER — SODIUM CHLORIDE 0.9 % IV SOLN
Freq: Once | INTRAVENOUS | Status: AC
  Administered 2015-06-26: 11:00:00 via INTRAVENOUS

## 2015-06-26 NOTE — H&P (Signed)
Chief Complaint: right internal carotid artery stenosis  Referring Physician:Dr. Luanne Bras  Supervising Physician: Luanne Bras  Patient Status:  Out-pt  HPI: Vicki Page is an 65 y.o. female who is followed by Dr. 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  The patient had a followed by carotid US in March of this year that appeared to show no evidence of significant CA stenosis bilaterally.  She occasionally has some intermittent HAs,but nothing significant.  She presents today for a follow up cerebral angiogram for further evaluation of right internal carotid artery stenosis that has been known.  Past Medical History:  Past Medical History  Diagnosis Date  . Hypertension   . Aneurysm ([place])     brain  . Hyperthyroidism   . Hyperlipidemia   . Carotid artery occlusion   . Ulcer of the stomach and intestine   . Arthritis   . DVT (deep venous thrombosis) ([place])   . Fall at home May 30, 2014    Pt slipped in tub- hurt left hip and right shoulder  . Adenomatous colon polyp   . Barrett's esophagus   . Anxiety   . IBS (irritable bowel syndrome)   . GERD (gastroesophageal reflux disease)   . H. pylori infection     Past Surgical History:  Past Surgical History  Procedure Laterality Date  . Brain surgery      Per patient " Left side behind eyes  . Aneurysm coiling  July 26, 2010    stent  . Lower extremity angiogram Left 10-04-12    and Left iliac stent  . Leg surgery    . Tubal ligation    . Lower extremity angiogram N/A 10/04/2012    Procedure: LOWER EXTREMITY ANGIOGRAM;  Surgeon: Jettie Booze, MD;  Location: [facility] CATH LAB;  Service: Cardiovascular;  Laterality: N/A;  . Insertion of iliac stent  10/04/2012    Procedure: INSERTION OF ILIAC STENT;  Surgeon: Jettie Booze, MD;  Location: [facility] CATH LAB;  Service: Cardiovascular;;  Left External Iliac Artery    Family History:  Family History  Problem Relation  Age of Onset  . Deep vein thrombosis Mother   . Diabetes Mother     Amputation  . Heart disease Mother     Heart Disease before age 77  . Hyperlipidemia Mother   . Hypertension Mother   . Heart attack Mother   . Stroke Mother   . Heart disease Father     Heart Disease before age 53  . Hypertension Father   . Heart attack Father   . Hyperlipidemia Father   . Diabetes Brother   . Heart disease Brother     Heart Disease before age 87  . Hyperlipidemia Brother   . Hypertension Brother   . Heart attack Brother   . COPD Brother   . Thyroid disease Mother     Social History:  reports that she has been smoking Cigarettes.  She has been smoking about 0.25 packs per day. She has never used smokeless tobacco. She reports that she does not drink alcohol or use illicit drugs.  Allergies:  Allergies  Allergen Reactions  . Codeine Nausea And Vomiting  . Darvon [Propoxyphene Hcl] Nausea And Vomiting  . Flagyl [Metronidazole] Nausea And Vomiting    Medications:   Medication List    ASK your doctor about these medications        acetaminophen 500 MG tablet  Commonly known as:  TYLENOL  Take 1,000 mg by mouth 2 (two) times daily as needed for pain.     aspirin EC 81 MG tablet  Take 81 mg by mouth daily.     buPROPion 150 MG 12 hr tablet  Commonly known as:  WELLBUTRIN SR  Take 150 mg by mouth 2 (two) times daily.     CALTRATE 600 PLUS-VIT D PO  Take by mouth.     clopidogrel 75 MG tablet  Commonly known as:  PLAVIX  Take 1 tablet (75 mg total) by mouth daily with breakfast.     levothyroxine 25 MCG tablet  Commonly known as:  SYNTHROID, LEVOTHROID  Take 25 mcg by mouth daily before breakfast.     losartan-hydrochlorothiazide 100-25 MG tablet  Commonly known as:  HYZAAR  Take 1 tablet by mouth daily.     magnesium oxide 400 MG tablet  Commonly known as:  MAG-OX  Take 400 mg by mouth daily.     multivitamin with minerals Tabs tablet  Take 1 tablet by mouth daily.  Centrum Silver     MYRBETRIQ 25 MG Tb24 tablet  Generic drug:  mirabegron ER  Take 25 mg by mouth daily.     pantoprazole 40 MG tablet  Commonly known as:  PROTONIX  Take 40 mg by mouth daily.     potassium gluconate 595 (99 K) MG Tabs tablet  Take 595 mg by mouth daily.     pyridOXINE 100 MG tablet  Commonly known as:  VITAMIN B-6  Take 100 mg by mouth daily.     rOPINIRole 0.5 MG tablet  Commonly known as:  REQUIP  Take 1 tablet by mouth daily.     simvastatin 40 MG tablet  Commonly known as:  ZOCOR  Take 40 mg by mouth daily with supper.     vitamin B-12 500 MCG tablet  Commonly known as:  CYANOCOBALAMIN  Take 500 mcg by mouth daily.     Vitamin D 2000 units Caps  Take 4,000 Units by mouth daily.        Please HPI for pertinent positives, otherwise complete 10 system ROS negative.  Mallampati Score: MD Evaluation Airway: WNL Heart: WNL Abdomen: WNL Chest/ Lungs: WNL ASA  Classification: 3 Mallampati/Airway Score: Two  Physical Exam: BP 149/78 mmHg  Pulse 82  Temp(Src) 98.6 F (37 C)  Resp 18  Ht 5\' 2"  (1.575 m)  Wt 149 lb (67.586 kg)  BMI 27.25 kg/m2  SpO2 100% Body mass index is 27.25 kg/(m^2). General: pleasant, WD, WN black female who is laying in bed in NAD HEENT: head is normocephalic, atraumatic.  Sclera are noninjected.  PERRL.  Ears and nose without any masses or lesions.  Mouth is pink and moist Heart: regular, rate, and rhythm.  Normal s1,s2. No obvious murmurs, gallops, or rubs noted.  Palpable radial and pedal pulses bilaterally Lungs: CTAB, no wheezes, rhonchi, or rales noted.  Respiratory effort nonlabored Abd: soft, NT, ND, +BS, no masses, hernias, or organomegaly Psych: A&Ox3 with an appropriate affect.   Labs: No results found for this or any previous visit (from the past 48 hour(s)).  Imaging: No results found.  Assessment/Plan 1. Right internal carotid artery stenosis -plan for cerebral angiogram today for further  evaluation and follow up of prior treated areas. -vitals have been reviewed, but labs are currently pending.  Will follow. -Risks and Benefits discussed with the patient including, but not limited to bleeding, infection, vascular injury or contrast induced renal failure. All  of the patient's questions were answered, patient is agreeable to proceed. Consent signed and in chart.  Thank you for this interesting consult.  I greatly enjoyed meeting Vicki Page and look forward to participating in their care.  A copy of this report was sent to the requesting provider on this date.  Electronically Signed: Henreitta Cea 06/26/2015, 10:17 AM   I spent a total of    25 Minutes in face to face in clinical consultation, greater than 50% of which was counseling/coordinating care for (R) ICA stenosis

## 2015-06-26 NOTE — Procedures (Addendum)
S/P 4 vessel cerebral arteriogram. RT CFA approach. Findings. 1..Approx 80 % stenosis Lt VBJ . 2.Approx 70 % stenosis of  RT ICA pet cav junction. 3.approx 50 % stenosis of Lt MCA mid M1 seg. 4.Obliterated LT ICA sup hypophyseal aneurysm.

## 2015-06-26 NOTE — Sedation Documentation (Signed)
ETCO2 removed per Dr. Deveshwar 

## 2015-06-26 NOTE — Sedation Documentation (Signed)
5 Fr. Exoseal to right groin 

## 2015-06-26 NOTE — Discharge Instructions (Signed)
Angiogram, Care After °Refer to this sheet in the next few weeks. These instructions provide you with information about caring for yourself after your procedure. Your health care provider may also give you more specific instructions. Your treatment has been planned according to current medical practices, but problems sometimes occur. Call your health care provider if you have any problems or questions after your procedure. °WHAT TO EXPECT AFTER THE PROCEDURE °After your procedure, it is typical to have the following: °· Bruising at the catheter insertion site that usually fades within 1-2 weeks. °· Blood collecting in the tissue (hematoma) that may be painful to the touch. It should usually decrease in size and tenderness within 1-2 weeks. °HOME CARE INSTRUCTIONS °· Take medicines only as directed by your health care provider. °· You may shower 24-48 hours after the procedure or as directed by your health care provider. Remove the bandage (dressing) and gently wash the site with plain soap and water. Pat the area dry with a clean towel. Do not rub the site, because this may cause bleeding. °· Do not take baths, swim, or use a hot tub until your health care provider approves. °· Check your insertion site every day for redness, swelling, or drainage. °· Do not apply powder or lotion to the site. °· Do not lift over 10 lb (4.5 kg) for 5 days after your procedure or as directed by your health care provider. °· Ask your health care provider when it is okay to: °¨ Return to work or school. °¨ Resume usual physical activities or sports. °¨ Resume sexual activity. °· Do not drive home if you are discharged the same day as the procedure. Have someone else drive you. °· You may drive 24 hours after the procedure unless otherwise instructed by your health care provider. °· Do not operate machinery or power tools for 24 hours after the procedure or as directed by your health care provider. °· If your procedure was done as an  outpatient procedure, which means that you went home the same day as your procedure, a responsible adult should be with you for the first 24 hours after you arrive home. °· Keep all follow-up visits as directed by your health care provider. This is important. °SEEK MEDICAL CARE IF: °· You have a fever. °· You have chills. °· You have increased bleeding from the catheter insertion site. Hold pressure on the site. °SEEK IMMEDIATE MEDICAL CARE IF: °· You have unusual pain at the catheter insertion site. °· You have redness, warmth, or swelling at the catheter insertion site. °· You have drainage (other than a small amount of blood on the dressing) from the catheter insertion site. °· The catheter insertion site is bleeding, and the bleeding does not stop after 30 minutes of holding steady pressure on the site. °· The area near or just beyond the catheter insertion site becomes pale, cool, tingly, or numb. °  °This information is not intended to replace advice given to you by your health care provider. Make sure you discuss any questions you have with your health care provider. °  °Document Released: 08/12/2004 Document Revised: 02/14/2014 Document Reviewed: 06/27/2012 °Elsevier Interactive Patient Education ©2016 [employer] ° °

## 2015-06-30 ENCOUNTER — Telehealth (HOSPITAL_COMMUNITY): Payer: Self-pay

## 2015-06-30 NOTE — Telephone Encounter (Signed)
Called to schedule consult with Dr. Estanislado Pandy, left message for pt to return call. AW

## 2015-07-02 ENCOUNTER — Other Ambulatory Visit (HOSPITAL_COMMUNITY): Payer: Self-pay | Admitting: Interventional Radiology

## 2015-07-02 DIAGNOSIS — I729 Aneurysm of unspecified site: Secondary | ICD-10-CM

## 2015-07-21 ENCOUNTER — Ambulatory Visit (HOSPITAL_COMMUNITY): Payer: Medicare Other

## 2015-07-22 ENCOUNTER — Ambulatory Visit (HOSPITAL_COMMUNITY)
Admission: RE | Admit: 2015-07-22 | Discharge: 2015-07-22 | Disposition: A | Discharge status: Home / Self Care | Payer: Medicare Other | Source: Ambulatory Visit | Attending: Interventional Radiology | Admitting: Interventional Radiology

## 2015-07-22 DIAGNOSIS — I729 Aneurysm of unspecified site: Secondary | ICD-10-CM

## 2015-08-28 ENCOUNTER — Other Ambulatory Visit: Payer: Self-pay | Admitting: Family Medicine

## 2015-08-28 DIAGNOSIS — Z1231 Encounter for screening mammogram for malignant neoplasm of breast: Secondary | ICD-10-CM

## 2015-09-15 ENCOUNTER — Other Ambulatory Visit (HOSPITAL_COMMUNITY): Payer: Self-pay | Admitting: Interventional Radiology

## 2015-09-15 DIAGNOSIS — I729 Aneurysm of unspecified site: Secondary | ICD-10-CM

## 2015-09-17 ENCOUNTER — Ambulatory Visit: Payer: Medicare Other

## 2015-09-24 ENCOUNTER — Ambulatory Visit (HOSPITAL_COMMUNITY): Payer: Medicare Other

## 2015-09-24 ENCOUNTER — Ambulatory Visit (HOSPITAL_COMMUNITY)
Admission: RE | Admit: 2015-09-24 | Discharge: 2015-09-24 | Disposition: A | Discharge status: Home / Self Care | Payer: Medicare Other | Source: Ambulatory Visit | Attending: Interventional Radiology | Admitting: Interventional Radiology

## 2015-09-24 DIAGNOSIS — I729 Aneurysm of unspecified site: Secondary | ICD-10-CM | POA: Diagnosis present

## 2015-09-24 DIAGNOSIS — I6521 Occlusion and stenosis of right carotid artery: Secondary | ICD-10-CM | POA: Diagnosis not present

## 2015-09-24 DIAGNOSIS — I998 Other disorder of circulatory system: Secondary | ICD-10-CM | POA: Insufficient documentation

## 2015-09-24 DIAGNOSIS — I6502 Occlusion and stenosis of left vertebral artery: Secondary | ICD-10-CM | POA: Diagnosis not present

## 2015-09-24 LAB — CREATININE, SERUM
Creatinine, Ser: 1.19 mg/dL — ABNORMAL HIGH (ref 0.44–1.00)
GFR calc Af Amer: 54 mL/min — ABNORMAL LOW (ref >60)
GFR calc non Af Amer: 47 mL/min — ABNORMAL LOW (ref >60)

## 2015-09-24 MED ORDER — GADOBENATE DIMEGLUMINE 529 MG/ML IV SOLN
14.0000 mL | Freq: Once | INTRAVENOUS | Status: AC | PRN
  Administered 2015-09-24: 14 mL via INTRAVENOUS

## 2015-09-25 ENCOUNTER — Ambulatory Visit
Admission: RE | Admit: 2015-09-25 | Discharge: 2015-09-25 | Disposition: A | Discharge status: Home / Self Care | Payer: Medicare Other | Source: Ambulatory Visit | Attending: Family Medicine | Admitting: Family Medicine

## 2015-09-25 DIAGNOSIS — Z1231 Encounter for screening mammogram for malignant neoplasm of breast: Secondary | ICD-10-CM

## 2015-10-08 ENCOUNTER — Telehealth (HOSPITAL_COMMUNITY): Payer: Self-pay

## 2015-10-08 NOTE — Telephone Encounter (Signed)
Left message for pt to return call. AW 

## 2015-10-14 ENCOUNTER — Encounter: Payer: Self-pay | Admitting: *Deleted

## 2015-10-14 ENCOUNTER — Ambulatory Visit (INDEPENDENT_AMBULATORY_CARE_PROVIDER_SITE_OTHER): Payer: Medicare Other | Admitting: Internal Medicine

## 2015-10-14 VITALS — BP 170/80 | HR 80 | Ht 61.5 in | Wt 156.1 lb

## 2015-10-14 DIAGNOSIS — K5730 Diverticulosis of large intestine without perforation or abscess without bleeding: Secondary | ICD-10-CM | POA: Diagnosis not present

## 2015-10-14 DIAGNOSIS — R150 Incomplete defecation: Secondary | ICD-10-CM | POA: Diagnosis not present

## 2015-10-14 DIAGNOSIS — K2270 Barrett's esophagus without dysplasia: Secondary | ICD-10-CM | POA: Diagnosis not present

## 2015-10-14 NOTE — Progress Notes (Signed)
Subjective:    Patient ID: Vicki Page, female    DOB: [birth year], 65 y.o.   MRN: [id number]  HPI Vicki Page is a 65 year old female with a past medical history of Barrett's esophagus, GERD, history of H. 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 She was last seen at time of her colonoscopy on 11/04/2014. This was after an office visit with Cecille Rubin Hvozdovic, PA-C. At that time she was having epigastric pain and had been seen in the ER for abdominal pain, diarrhea. CT scan was abnormal showing thickening and possible mass in the ascending colon.  Upper endoscopy and colonoscopy were performed in September 2016. Upper endoscopy showed short segment Barrett's esophagus and was otherwise normal. Biopsy showed Barrett's esophagus without dysplasia. There was mild chronic inflammation negative for H. pylori in the stomach. Colonoscopy revealed area of resolving inflammation the ascending colon near the ileocecal valve. This was biopsied. There was moderate diverticulosis from transverse to sigmoid colon. A 5 mm sigmoid polyp was removed and found be tubular adenoma. Biopsies from the right colon showed findings consistent with chronic/resolving ischemic injury.  She reports most recently she's had issues with incomplete evacuation. She notes a difficult time wiping and keeping clean after a bowel movement. At times she states after bowel movement she has to take a bath. She is reporting some tenesmus type symptoms particular after bowel movement. Bowel movements are about every morning and once per day rarely twice per day. She has some perianal soreness from wiping. Stools are soft and she denies blood in her stool or melena. She is not having specifically diarrhea or constipation. She's tried probiotic but been inconsistent with dosing. She does report issues with frequent flatulence.  Review of Systems As per history of  present illness, otherwise negative  Current Medications, Allergies, Past Medical History, Past Surgical History, Family History and Social History were reviewed in [employer] record.     Objective:   Physical Exam BP (!) 170/80 (BP Location: Left Arm, Patient Position: Sitting, Cuff Size: Normal)   Pulse 80   Ht 5' 1.5" (1.562 m)   Wt 156 lb 2 oz (70.8 kg)   BMI 29.02 kg/m  Constitutional: Well-developed and well-nourished. No distress. HEENT: Normocephalic and atraumatic. Oropharynx is clear and moist. No oropharyngeal exudate. Conjunctivae are normal.  No scleral icterus. Neck: Neck supple. Trachea midline. Cardiovascular: Normal rate, regular rhythm and intact distal pulses.  Pulmonary/chest: Effort normal and breath sounds normal. No wheezing, rales or rhonchi. Abdominal: Soft, Mild diffuse tenderness without rebound or guarding, nondistended. Bowel sounds active throughout.  Extremities: no clubbing, cyanosis, or edema Neurological: Alert and oriented to person place and time. Skin: Skin is warm and dry. No rashes noted. Psychiatric: Normal mood and affect. Behavior is normal.  CBC    Component Value Date/Time   WBC 4.2 06/26/2015 1015   RBC 5.24 (H) 06/26/2015 1015   HGB 14.4 06/26/2015 1015   HCT 44.0 06/26/2015 1015   PLT 235 06/26/2015 1015   MCV 84.0 06/26/2015 1015   MCH 27.5 06/26/2015 1015   MCHC 32.7 06/26/2015 1015   RDW 14.9 06/26/2015 1015   LYMPHSABS 1.8 10/20/2014 1520   MONOABS 0.5 10/20/2014 1520   EOSABS 0.0 10/20/2014 1520   BASOSABS 0.0 10/20/2014 1520   CMP     Component Value Date/Time   NA 142 06/26/2015 1015   K 3.9 06/26/2015 1015  CL 106 06/26/2015 1015   CO2 28 06/26/2015 1015   GLUCOSE 98 06/26/2015 1015   BUN 13 06/26/2015 1015   CREATININE 1.19 (H) 09/24/2015 1400   CALCIUM 9.1 06/26/2015 1015   PROT 6.9 10/13/2014 1202   ALBUMIN 3.4 (L) 10/13/2014 1202   AST 24 10/13/2014 1202   ALT 20 10/13/2014 1202     ALKPHOS 78 10/13/2014 1202   BILITOT 0.6 10/13/2014 1202   GFRNONAA 47 (L) 09/24/2015 1400   GFRAA 54 (L) 09/24/2015 1400       Assessment & Plan:  65 year old female with a past medical history of Barrett's esophagus, GERD, history of H. 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  1. Incomplete evacuation -- symptoms may be secondary to pelvic floor dysfunction. I recommended that she begin Benefiber 1 tablespoon daily increasing to 2 tablespoons daily after several weeks. Low gas diet was recommended. I also recommended she be more consistent with probiotic and take this on a daily basis, as inconsistency with probiotic administration can lead to more gas and bloating. If the addition of dietary fiber feels to aid with complete evacuation, I would recommend consideration of anti-rectal manometry. Follow-up 3-4 months, sooner if necessary  2. Barrett's esophagus -- short segment, no dysplasia. Surveillance recommended in September 2019. Continue daily PPI  3. History of adenomatous colon polyp -- 5 year surveillance colonoscopy recommended  25 minutes spent with the patient today. Greater than 50% was spent in counseling and coordination of care with the patient

## 2015-10-14 NOTE — Patient Instructions (Addendum)
Please make sure you are consistent with taking your probiotic every day.  Please purchase the following medications over the counter and take as directed: Benefiber 1 tablespoon, working up to 2 tablespoons daily  Follow a low gas diet. See brochure given to you today.  Call our office in 1 month with an update.  Follow up with Dr Hilarie Fredrickson in 3-4 months.  If you are age 65 or older, your body mass index should be between 23-30. Your Body mass index is 29.02 kg/m. If this is out of the aforementioned range listed, please consider follow up with your Primary Care Provider.  If you are age 19 or younger, your body mass index should be between 19-25. Your Body mass index is 29.02 kg/m. If this is out of the aformentioned range listed, please consider follow up with your Primary Care Provider.

## 2015-10-27 ENCOUNTER — Other Ambulatory Visit (HOSPITAL_COMMUNITY): Payer: Self-pay | Admitting: Interventional Radiology

## 2015-10-27 DIAGNOSIS — H538 Other visual disturbances: Secondary | ICD-10-CM

## 2015-10-27 DIAGNOSIS — R42 Dizziness and giddiness: Secondary | ICD-10-CM

## 2015-10-27 DIAGNOSIS — I729 Aneurysm of unspecified site: Secondary | ICD-10-CM

## 2015-11-02 ENCOUNTER — Other Ambulatory Visit: Payer: Self-pay | Admitting: Radiology

## 2015-11-03 ENCOUNTER — Encounter (HOSPITAL_COMMUNITY): Payer: Self-pay

## 2015-11-03 ENCOUNTER — Other Ambulatory Visit (HOSPITAL_COMMUNITY): Payer: Self-pay | Admitting: Interventional Radiology

## 2015-11-03 ENCOUNTER — Ambulatory Visit (HOSPITAL_COMMUNITY)
Admission: RE | Admit: 2015-11-03 | Discharge: 2015-11-03 | Disposition: A | Discharge status: Home / Self Care | Payer: Medicare Other | Source: Ambulatory Visit | Attending: Interventional Radiology | Admitting: Interventional Radiology

## 2015-11-03 DIAGNOSIS — Z9181 History of falling: Secondary | ICD-10-CM | POA: Insufficient documentation

## 2015-11-03 DIAGNOSIS — Z8249 Family history of ischemic heart disease and other diseases of the circulatory system: Secondary | ICD-10-CM | POA: Diagnosis not present

## 2015-11-03 DIAGNOSIS — I10 Essential (primary) hypertension: Secondary | ICD-10-CM | POA: Insufficient documentation

## 2015-11-03 DIAGNOSIS — F17210 Nicotine dependence, cigarettes, uncomplicated: Secondary | ICD-10-CM | POA: Insufficient documentation

## 2015-11-03 DIAGNOSIS — K5790 Diverticulosis of intestine, part unspecified, without perforation or abscess without bleeding: Secondary | ICD-10-CM | POA: Insufficient documentation

## 2015-11-03 DIAGNOSIS — K589 Irritable bowel syndrome without diarrhea: Secondary | ICD-10-CM | POA: Insufficient documentation

## 2015-11-03 DIAGNOSIS — Z833 Family history of diabetes mellitus: Secondary | ICD-10-CM | POA: Diagnosis not present

## 2015-11-03 DIAGNOSIS — K219 Gastro-esophageal reflux disease without esophagitis: Secondary | ICD-10-CM | POA: Diagnosis not present

## 2015-11-03 DIAGNOSIS — M1990 Unspecified osteoarthritis, unspecified site: Secondary | ICD-10-CM | POA: Insufficient documentation

## 2015-11-03 DIAGNOSIS — Z7902 Long term (current) use of antithrombotics/antiplatelets: Secondary | ICD-10-CM | POA: Diagnosis not present

## 2015-11-03 DIAGNOSIS — R42 Dizziness and giddiness: Secondary | ICD-10-CM | POA: Diagnosis present

## 2015-11-03 DIAGNOSIS — H538 Other visual disturbances: Secondary | ICD-10-CM

## 2015-11-03 DIAGNOSIS — Z823 Family history of stroke: Secondary | ICD-10-CM | POA: Diagnosis not present

## 2015-11-03 DIAGNOSIS — G2581 Restless legs syndrome: Secondary | ICD-10-CM | POA: Diagnosis not present

## 2015-11-03 DIAGNOSIS — Z8719 Personal history of other diseases of the digestive system: Secondary | ICD-10-CM | POA: Insufficient documentation

## 2015-11-03 DIAGNOSIS — E785 Hyperlipidemia, unspecified: Secondary | ICD-10-CM | POA: Diagnosis not present

## 2015-11-03 DIAGNOSIS — Z7982 Long term (current) use of aspirin: Secondary | ICD-10-CM | POA: Insufficient documentation

## 2015-11-03 DIAGNOSIS — G450 Vertebro-basilar artery syndrome: Secondary | ICD-10-CM | POA: Diagnosis not present

## 2015-11-03 DIAGNOSIS — I729 Aneurysm of unspecified site: Secondary | ICD-10-CM

## 2015-11-03 DIAGNOSIS — Z86718 Personal history of other venous thrombosis and embolism: Secondary | ICD-10-CM | POA: Diagnosis not present

## 2015-11-03 DIAGNOSIS — F419 Anxiety disorder, unspecified: Secondary | ICD-10-CM | POA: Insufficient documentation

## 2015-11-03 HISTORY — PX: IR GENERIC HISTORICAL: IMG1180011

## 2015-11-03 LAB — CBC
HCT: 48.7 % — ABNORMAL HIGH (ref 36.0–46.0)
Hemoglobin: 16.3 g/dL — ABNORMAL HIGH (ref 12.0–15.0)
MCH: 28.7 pg (ref 26.0–34.0)
MCHC: 33.5 g/dL (ref 30.0–36.0)
MCV: 85.9 fL (ref 78.0–100.0)
Platelets: 229 10*3/uL (ref 150–400)
RBC: 5.67 MIL/uL — ABNORMAL HIGH (ref 3.87–5.11)
RDW: 15.2 % (ref 11.5–15.5)
WBC: 5.9 10*3/uL (ref 4.0–10.5)

## 2015-11-03 LAB — BASIC METABOLIC PANEL
Anion gap: 10 (ref 5–15)
BUN: 16 mg/dL (ref 6–20)
CO2: 27 mmol/L (ref 22–32)
Calcium: 9.5 mg/dL (ref 8.9–10.3)
Chloride: 104 mmol/L (ref 101–111)
Creatinine, Ser: 1.25 mg/dL — ABNORMAL HIGH (ref 0.44–1.00)
GFR calc Af Amer: 51 mL/min — ABNORMAL LOW (ref >60)
GFR calc non Af Amer: 44 mL/min — ABNORMAL LOW (ref >60)
Glucose, Bld: 95 mg/dL (ref 65–99)
Potassium: 3.3 mmol/L — ABNORMAL LOW (ref 3.5–5.1)
Sodium: 141 mmol/L (ref 135–145)

## 2015-11-03 LAB — APTT: aPTT: 26 seconds (ref 24–36)

## 2015-11-03 LAB — PROTIME-INR
INR: 0.95
Prothrombin Time: 12.7 seconds (ref 11.4–15.2)

## 2015-11-03 MED ORDER — FENTANYL CITRATE (PF) 100 MCG/2ML IJ SOLN
INTRAMUSCULAR | Status: AC
  Filled 2015-11-03: qty 2

## 2015-11-03 MED ORDER — SODIUM CHLORIDE 0.9 % IV SOLN
INTRAVENOUS | Status: AC | PRN
  Administered 2015-11-03: 250 mL via INTRAVENOUS

## 2015-11-03 MED ORDER — MIDAZOLAM HCL 2 MG/2ML IJ SOLN
INTRAMUSCULAR | Status: AC | PRN
  Administered 2015-11-03: 1 mg via INTRAVENOUS

## 2015-11-03 MED ORDER — SODIUM CHLORIDE 0.9 % IV SOLN
Freq: Once | INTRAVENOUS | Status: AC
  Administered 2015-11-03: 11:00:00 via INTRAVENOUS

## 2015-11-03 MED ORDER — LIDOCAINE HCL 1 % IJ SOLN
INTRAMUSCULAR | Status: AC | PRN
  Administered 2015-11-03: 10 mL

## 2015-11-03 MED ORDER — LIDOCAINE HCL 1 % IJ SOLN
INTRAMUSCULAR | Status: AC
  Filled 2015-11-03: qty 20

## 2015-11-03 MED ORDER — FENTANYL CITRATE (PF) 100 MCG/2ML IJ SOLN
INTRAMUSCULAR | Status: AC | PRN
  Administered 2015-11-03: 25 ug via INTRAVENOUS

## 2015-11-03 MED ORDER — HEPARIN SODIUM (PORCINE) 1000 UNIT/ML IJ SOLN
INTRAMUSCULAR | Status: AC
  Filled 2015-11-03: qty 2

## 2015-11-03 MED ORDER — IOPAMIDOL (ISOVUE-300) INJECTION 61%
INTRAVENOUS | Status: AC
  Administered 2015-11-03: 75 mL
  Filled 2015-11-03: qty 150

## 2015-11-03 MED ORDER — HEPARIN SODIUM (PORCINE) 1000 UNIT/ML IJ SOLN
INTRAMUSCULAR | Status: AC | PRN
  Administered 2015-11-03: 1000 [IU] via INTRAVENOUS

## 2015-11-03 MED ORDER — IOPAMIDOL (ISOVUE-300) INJECTION 61%
INTRAVENOUS | Status: AC
  Administered 2015-11-03: 15 mL
  Filled 2015-11-03: qty 50

## 2015-11-03 MED ORDER — SODIUM CHLORIDE 0.9 % IV SOLN
INTRAVENOUS | Status: AC
  Administered 2015-11-03: 15:00:00 via INTRAVENOUS

## 2015-11-03 MED ORDER — MIDAZOLAM HCL 2 MG/2ML IJ SOLN
INTRAMUSCULAR | Status: AC
  Filled 2015-11-03: qty 2

## 2015-11-03 NOTE — Procedures (Signed)
S/P 4 vesse3l cerebral arteriogram RT CFA approach. Findings. 1approx 65 % to 70 % stenosis of LT VBJ. 2.Approx 75% stenosis of RT ICA petrous cav junc 3.Apporx 30  To 50 % stenosis of LVA origin.

## 2015-11-03 NOTE — Sedation Documentation (Signed)
V-pad to right groin

## 2015-11-03 NOTE — Sedation Documentation (Signed)
ETC02 removed per Dr. Deveshwar  

## 2015-11-03 NOTE — Discharge Instructions (Signed)

## 2015-11-03 NOTE — H&P (Signed)
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 female well known to Dr. Estanislado Pandy who is here today for Carotid/Vertebral/Cerebral angiography.  She had angiography by Dr. 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 This was worse than on previous arteriograms.  She continues to c/o dizziness. She also reports if she "reads to long, the words get blurry".  She states she has to look away for a while, then she can start to read again.  She denies any other neurological deficits. No facial droop, no weakness or paralysis, no speech difficulty.  She does take Plavix and Aspirin.  She is NPO. She feels well, no fever/chills.  Past Medical History:  Diagnosis Date  . Adenomatous colon polyp   . 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 Anxiety   . Arthritis   . Atherosclerosis   . Barrett's esophagus   . Carotid artery occlusion   . Diverticulosis   . DVT (deep venous thrombosis) ([place])   . Fall at home May 30, 2014   Pt slipped in tub- hurt left hip and right shoulder  . GERD (gastroesophageal reflux disease)   . H. pylori infection   . Hyperlipidemia   . Hypertension   . Hyperthyroidism   . IBS (irritable bowel syndrome)   . Osteoarthritis   . RLS (restless legs syndrome)   . Ulcer of the stomach and intestine     Past Surgical History:  Procedure Laterality Date  . ANEURYSM COILING  July 26, 2010   stent  . BRAIN SURGERY     Per patient " Left side behind eyes  . INSERTION OF ILIAC STENT  10/04/2012   Procedure: INSERTION OF ILIAC STENT;  Surgeon: Jettie Booze, MD;  Location: [facility] CATH LAB;  Service: Cardiovascular;;  Left  External Iliac Artery  . LEG SURGERY    . LOWER EXTREMITY ANGIOGRAM Left 10-04-12   and Left iliac stent  . LOWER EXTREMITY ANGIOGRAM N/A 10/04/2012   Procedure: LOWER EXTREMITY ANGIOGRAM;  Surgeon: Jettie Booze, MD;  Location: [facility] CATH LAB;  Service: Cardiovascular;  Laterality: N/A;  . TUBAL LIGATION      Allergies: Codeine; Darvon [propoxyphene hcl]; and Flagyl [metronidazole]  Medications: Prior to Admission medications   Medication Sig Start Date End Date Taking? Authorizing Provider  acetaminophen (TYLENOL) 500 MG tablet Take 1,000 mg by mouth 2 (two) times daily as needed for pain.    Yes Historical Provider, MD  aspirin EC 81 MG tablet Take 81 mg by mouth daily.   Yes Historical Provider, MD  buPROPion (WELLBUTRIN SR) 150 MG 12 hr tablet Take 150 mg by mouth 2 (two) times daily.   Yes Historical Provider, MD  Calcium-Vitamin D (CALTRATE 600 PLUS-VIT D PO) Take 1 tablet by mouth daily before supper.    Yes Historical Provider, MD  Cholecalciferol (VITAMIN D) 2000 UNITS CAPS Take 2,000 Units by mouth daily before supper.    Yes Historical Provider, MD  clopidogrel (PLAVIX) 75 MG tablet Take 1 tablet (75 mg total) by mouth daily with breakfast. 10/05/12  Yes Jettie Booze, MD  levothyroxine (SYNTHROID, LEVOTHROID) 25 MCG tablet Take 25 mcg by mouth daily before breakfast.    Yes  Historical Provider, MD  losartan-hydrochlorothiazide (HYZAAR) 100-25 MG per tablet Take 1 tablet by mouth daily.   Yes Historical Provider, MD  Magnesium 250 MG TABS Take 250 mg by mouth daily before supper.   Yes Historical Provider, MD  mirabegron ER (MYRBETRIQ) 25 MG TB24 tablet Take 25 mg by mouth at bedtime.    Yes Historical Provider, MD  Multiple Vitamin (MULTIVITAMIN WITH MINERALS) TABS tablet Take 1 tablet by mouth daily before supper. Centrum Silver    Yes Historical Provider, MD  pantoprazole (PROTONIX) 40 MG tablet Take 40 mg by mouth daily.   Yes Historical Provider, MD  potassium  gluconate 595 MG TABS Take 595 mg by mouth daily before supper.    Yes Historical Provider, MD  PRESCRIPTION MEDICATION Apply 1 application topically See admin instructions. C-Wart Cream compounded at [place]: Cimetidine 2%, Deoxy-d-glucose 0.2%, Fluorouracil powder 5%, salicylic acid 15 %.  Apply to bottom of feet twice daily.   Yes Historical Provider, MD  Probiotic Product (PROBIOTIC PO) Take 1 tablet by mouth daily after lunch.    Yes Historical Provider, MD  pyridOXINE (VITAMIN B-6) 100 MG tablet Take 100 mg by mouth daily before supper.    Yes Historical Provider, MD  rOPINIRole (REQUIP) 1 MG tablet Take 1 mg by mouth at bedtime. 08/25/15  Yes Historical Provider, MD  Simethicone (EQ GAS RELIEF PO) Take 1 tablet by mouth daily after lunch.    Yes Historical Provider, MD  simvastatin (ZOCOR) 40 MG tablet Take 40 mg by mouth daily with supper.    Yes Historical Provider, MD  terbinafine (LAMISIL) 250 MG tablet Take 250 mg by mouth daily before supper.    Yes Historical Provider, MD  Tetrahydrozoline HCl (VISINE OP) Place 1 drop into both eyes daily.   Yes Historical Provider, MD  vitamin B-12 (CYANOCOBALAMIN) 500 MCG tablet Take 500 mcg by mouth daily.   Yes Historical Provider, MD     Family History  Problem Relation Age of Onset  . Deep vein thrombosis Mother   . Diabetes Mother     Amputation  . Heart disease Mother     Heart Disease before age 51  . Hyperlipidemia Mother   . Hypertension Mother   . Heart attack Mother   . Stroke Mother   . Thyroid disease Mother   . Heart disease Father     Heart Disease before age 7  . Hypertension Father   . Heart attack Father   . Hyperlipidemia Father   . Diabetes Brother   . Heart disease Brother     Heart Disease before age 53  . Hyperlipidemia Brother   . Hypertension Brother   . Heart attack Brother   . COPD Brother     Social History   Social History  . Marital status: Single    Spouse name: N/A  . Number of  children: 2  . Years of education: N/A   Social History Main Topics  . Smoking status: Light Tobacco Smoker    Packs/day: 0.25    Types: Cigarettes  . Smokeless tobacco: Never Used  . Alcohol use No  . Drug use: No  . Sexual activity: Not Asked   Other Topics Concern  . None   Social History Narrative  . None   Review of Systems: A 12 point ROS discussed  Review of Systems  Constitutional: Negative.   HENT: Negative.   Eyes: Positive for visual disturbance.  Respiratory: Negative.   Cardiovascular: Negative.  Gastrointestinal: Negative.   Genitourinary: Negative.   Musculoskeletal: Negative.   Skin: Negative.   Neurological: Positive for dizziness.  Hematological: Negative.   Psychiatric/Behavioral: Negative.     Vital Signs: BP (!) 158/79   Pulse 73   Temp 97.9 F (36.6 C) (Oral)   Resp 16   Ht 5\' 2"  (1.575 m)   Wt 155 lb (70.3 kg)   SpO2 94%   BMI 28.35 kg/m   Physical Exam  Constitutional: She is oriented to person, place, and time. She appears well-nourished.  HENT:  Head: Normocephalic and atraumatic.  Eyes: EOM are normal.  Neck: Normal range of motion.  Pulmonary/Chest: Effort normal and breath sounds normal. No respiratory distress. She has no wheezes.  Abdominal: Soft. She exhibits no distension. There is no tenderness.  Musculoskeletal: Normal range of motion.  Neurological: She is alert and oriented to person, place, and time. No cranial nerve deficit.  Skin: Skin is warm and dry.  Psychiatric: She has a normal mood and affect. Her behavior is normal. Judgment and thought content normal.  Vitals reviewed.   Mallampati Score:  MD Evaluation Airway: WNL Heart: WNL Abdomen: WNL Chest/ Lungs: WNL ASA  Classification: 2 Mallampati/Airway Score: One  Imaging: No results found.  Labs:  CBC:  Recent Labs  06/26/15 1015  WBC 4.2  HGB 14.4  HCT 44.0  PLT 235    COAGS:  Recent Labs  06/26/15 1015  INR 1.09  APTT 29     BMP:  Recent Labs  06/26/15 1015 09/24/15 1400  NA 142  --   K 3.9  --   CL 106  --   CO2 28  --   GLUCOSE 98  --   BUN 13  --   CALCIUM 9.1  --   CREATININE 1.19* 1.19*  GFRNONAA 47* 47*  GFRAA 54* 54*    LIVER FUNCTION TESTS: No results for input(s): BILITOT, AST, ALT, ALKPHOS, PROT, ALBUMIN in the last 8760 hours.  TUMOR MARKERS: No results for input(s): AFPTM, CEA, CA199, CHROMGRNA in the last 8760 hours.  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  Will proceed with Angiography with possible angioplasty today by Dr. Estanislado Pandy.  Risks and Benefits discussed with the patient including, but not limited to bleeding, infection, vascular injury, contrast induced renal failure, stroke or even death.  All of the patient's questions were answered, patient is agreeable to proceed. Consent signed and in chart.  Electronically Signed: Murrell Redden PA-C 11/03/2015, 10:47 AM   I spent a total of    25 Minutes in face to face in clinical consultation, greater than 50% of which was counseling/coordinating care for cerebral angiography.

## 2015-11-04 ENCOUNTER — Encounter (HOSPITAL_COMMUNITY): Payer: Self-pay | Admitting: Interventional Radiology

## 2015-11-12 ENCOUNTER — Other Ambulatory Visit (HOSPITAL_COMMUNITY): Payer: Self-pay | Admitting: Interventional Radiology

## 2015-11-12 DIAGNOSIS — I729 Aneurysm of unspecified site: Secondary | ICD-10-CM

## 2015-11-19 ENCOUNTER — Ambulatory Visit (HOSPITAL_COMMUNITY)
Admission: RE | Admit: 2015-11-19 | Discharge: 2015-11-19 | Disposition: A | Discharge status: Home / Self Care | Payer: Medicare Other | Source: Ambulatory Visit | Attending: Interventional Radiology | Admitting: Interventional Radiology

## 2015-11-19 DIAGNOSIS — I729 Aneurysm of unspecified site: Secondary | ICD-10-CM

## 2015-11-19 HISTORY — PX: IR GENERIC HISTORICAL: IMG1180011

## 2015-11-20 ENCOUNTER — Encounter (HOSPITAL_COMMUNITY): Payer: Self-pay | Admitting: Interventional Radiology

## 2015-11-26 ENCOUNTER — Encounter: Payer: Self-pay | Admitting: Family

## 2015-11-30 ENCOUNTER — Telehealth (HOSPITAL_COMMUNITY): Payer: Self-pay | Admitting: Surgery

## 2015-11-30 ENCOUNTER — Encounter (HOSPITAL_COMMUNITY): Payer: Self-pay

## 2015-11-30 ENCOUNTER — Ambulatory Visit: Payer: Medicare Other | Admitting: Family

## 2015-11-30 ENCOUNTER — Encounter (HOSPITAL_COMMUNITY): Payer: BC Managed Care – PPO

## 2015-11-30 NOTE — Telephone Encounter (Signed)
-----   Message from Mena Goes, RN sent at 11/27/2015  1:07 PM EDT ----- Regarding: RE: Appt Sch'd 10/23 No I read the chart notes and Deveshwar needs to follow her. Thanks ----- Message ----- From: Rufina Falco Sent: 11/27/2015  12:52 PM To: [facility] Clinical Pool Subject: Appt Sch'd 10/23                               Pt is scheduled for her 18 month carotid follow-up 10/23.   She states that she had an angiogram with Dr. Luanne Bras, recently.  She cannot afford to come to two different doctors for the same problem.   Please advise if our follow-up is needed.      Rip Harbour

## 2015-12-22 ENCOUNTER — Ambulatory Visit: Payer: Self-pay | Admitting: Neurology

## 2016-01-04 ENCOUNTER — Telehealth: Payer: Self-pay | Admitting: *Deleted

## 2016-01-04 NOTE — Telephone Encounter (Signed)
Called and spoke to pt. R/s appt on 12/11 at 130pm to same day but 430pm because AA,MD has a meeting at that time. Advised her to check in at 400pm to go over paperwork, bring insurance card and updated medication list. She verbalized understanding.

## 2016-01-18 ENCOUNTER — Ambulatory Visit (INDEPENDENT_AMBULATORY_CARE_PROVIDER_SITE_OTHER): Payer: Medicare Other | Admitting: Neurology

## 2016-01-18 ENCOUNTER — Encounter: Payer: Self-pay | Admitting: Neurology

## 2016-01-18 ENCOUNTER — Ambulatory Visit: Payer: Self-pay | Admitting: Neurology

## 2016-01-18 VITALS — BP 134/74 | HR 86 | Ht 62.0 in | Wt 159.0 lb

## 2016-01-18 DIAGNOSIS — R5381 Other malaise: Secondary | ICD-10-CM

## 2016-01-18 DIAGNOSIS — E538 Deficiency of other specified B group vitamins: Secondary | ICD-10-CM | POA: Diagnosis not present

## 2016-01-18 DIAGNOSIS — R5382 Chronic fatigue, unspecified: Secondary | ICD-10-CM | POA: Diagnosis not present

## 2016-01-18 DIAGNOSIS — F4329 Adjustment disorder with other symptoms: Secondary | ICD-10-CM

## 2016-01-18 DIAGNOSIS — R413 Other amnesia: Secondary | ICD-10-CM | POA: Diagnosis not present

## 2016-01-18 DIAGNOSIS — R42 Dizziness and giddiness: Secondary | ICD-10-CM

## 2016-01-18 NOTE — Patient Instructions (Signed)
Remember to drink plenty of fluid, eat healthy meals and do not skip any meals. Try to eat protein with a every meal and eat a healthy snack such as fruit or nuts in between meals. Try to keep a regular sleep-wake schedule and try to exercise daily, particularly in the form of walking, 20-30 minutes a day, if you can.   As far as diagnostic testing: Labs, come back at your convenience  I would like to see you back in 6 months, sooner if we need to. Please call us with any interim questions, concerns, problems, updates or refill requests.   Our phone number is [phone]. We also have an after hours call service for urgent matters and there is a physician on-call for urgent questions. For any emergencies you know to call 911 or go to the nearest emergency room

## 2016-01-18 NOTE — Progress Notes (Signed)
[facility]    Provider:  Dr Jaynee Eagles Referring Provider: Luanne Bras Primary Care Physician:  Shirline Frees, MD  CC:  Dizziness and memory loss  HPI:  Vicki Page is a 65 y.o. female here as a referral from Dr. Estanislado Pandy for memory loss. PMHx . She started having memory loss for 4-5 months. Nothing happened 4-5 months ago. 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 She is forgetting her appointments, she forgot 2 appointments the same week and she had to reschedule. She has forgotten other things as well. She is getting ready to go to the store and she is in the car going to the store and by the time she gets into the store she has forgotten. She has to write things down. No dementia in the family. She was taking care of her brother he had multipl ehealth issues, she cared for him full time for 2-3 years the last year he went downhill and it was difficult for her. Since he has died she is more stressed, she has a 65 year old homeless daughter who needs mental health.  She is doing poorly and this is a lot of stress. She didnlt have a chance to grieve her brother. She doesn't sleep well. She is fatigued. She is not exercising.  She lives independently, paying all the bills, not getting lost, she has cataracts and having difficulty driving at night. Not waking with headaches, not snoring, not sleeping.  Dizziness is on waking and if she turns quickly or gets up too fast. No other focal neurologic deficits, no inciting events or head trauma, no other associated symptoms or modifiable factors.  Reviewed notes, labs and imaging from outside physicians, which showed:  Personally reviewed images of the brain October 2017 and agree with following: 1. Moderate chronic small vessel ischemic disease, mildly progressed from 2013. 2. Moderate left V4 vertebral artery stenosis and moderate to severe proximal right cavernous ICA stenosis, likely unchanged  from the 06/2015 angiogram. 3. 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  Review of Systems: Patient complains of symptoms per HPI as well as the following symptoms: weight gain, blurred vision, double vision, memory loss, headache, dizziness, sleepinessm restless, depressin, decreased energy, change in appetite, aching muscles, constipation. Pertinent negatives per HPI. All others negative.   Social History   Social History  . Marital status: Single    Spouse name: N/A  . Number of children: 2  . Years of education: N/A   Occupational History  . Retired    Social History Main Topics  . Smoking status: Light Tobacco Smoker    Packs/day: 0.25    Types: Cigarettes  . Smokeless tobacco: Never Used  . Alcohol use No  . Drug use: No  . Sexual activity: Not on file   Other Topics Concern  . Not on file   Social History Narrative   Lives alone   Caffeine use: none    Family History  Problem Relation Age of Onset  . Deep vein thrombosis Mother   . Diabetes Mother     Amputation  . Heart disease Mother     Heart Disease before age 36  . Hyperlipidemia Mother   . Hypertension Mother   . Heart attack Mother   . Stroke Mother   . Thyroid disease Mother   . Heart disease Father     Heart Disease before age 72  . Hypertension  Father   . Heart attack Father   . Hyperlipidemia Father   . Diabetes Brother   . Heart disease Brother     Heart Disease before age 58  . Hyperlipidemia Brother   . Hypertension Brother   . Heart attack Brother   . COPD Brother     Past Medical History:  Diagnosis Date  . Adenomatous colon polyp   . 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 Anxiety   . Arthritis   . Atherosclerosis   . Barrett's esophagus   . Carotid artery occlusion   . Diverticulosis   . DVT (deep venous thrombosis) ([place])   . Fall at home May 30, 2014   Pt slipped in tub- hurt left hip and right shoulder    . GERD (gastroesophageal reflux disease)   . H. pylori infection   . Hyperlipidemia   . Hypertension   . Hyperthyroidism   . IBS (irritable bowel syndrome)   . Osteoarthritis   . RLS (restless legs syndrome)   . Ulcer of the stomach and intestine     Past Surgical History:  Procedure Laterality Date  . ANEURYSM COILING  July 26, 2010   stent  . BRAIN SURGERY     Per patient " Left side behind eyes  . INSERTION OF ILIAC STENT  10/04/2012   Procedure: INSERTION OF ILIAC STENT;  Surgeon: Jettie Booze, MD;  Location: [facility] CATH LAB;  Service: Cardiovascular;;  Left External Iliac Artery  . IR GENERIC HISTORICAL  11/03/2015   IR ANGIO VERTEBRAL SEL VERTEBRAL UNI L MOD SED 11/03/2015 Luanne Bras, MD [facility]  . IR GENERIC HISTORICAL  11/03/2015   IR ANGIO INTRA EXTRACRAN SEL COM CAROTID INNOMINATE BILAT MOD SED 11/03/2015 Luanne Bras, MD [facility]  . IR GENERIC HISTORICAL  11/03/2015   IR ANGIO VERTEBRAL SEL SUBCLAVIAN INNOMINATE UNI R MOD SED 11/03/2015 Luanne Bras, MD [facility]  . IR GENERIC HISTORICAL  11/19/2015   IR RADIOLOGIST EVAL & MGMT 11/19/2015 [facility]  . LEG SURGERY    . LOWER EXTREMITY ANGIOGRAM Left 10-04-12   and Left iliac stent  . LOWER EXTREMITY ANGIOGRAM N/A 10/04/2012   Procedure: LOWER EXTREMITY ANGIOGRAM;  Surgeon: Jettie Booze, MD;  Location: [facility] CATH LAB;  Service: Cardiovascular;  Laterality: N/A;  . TUBAL LIGATION      Current Outpatient Prescriptions  Medication Sig Dispense Refill  . acetaminophen (TYLENOL) 500 MG tablet Take 1,000 mg by mouth 2 (two) times daily as needed for pain.     Marland Kitchen aspirin EC 81 MG tablet Take 81 mg by mouth daily.    Marland Kitchen buPROPion (WELLBUTRIN SR) 150 MG 12 hr tablet Take 150 mg by mouth 2 (two) times daily.    . Calcium-Vitamin D (CALTRATE 600 PLUS-VIT D PO) Take 1 tablet by mouth daily before supper.     . Cholecalciferol (VITAMIN D) 2000 UNITS CAPS Take 2,000 Units by mouth daily before  supper.     . clopidogrel (PLAVIX) 75 MG tablet Take 1 tablet (75 mg total) by mouth daily with breakfast. 30 tablet 2  . levothyroxine (SYNTHROID, LEVOTHROID) 25 MCG tablet Take 25 mcg by mouth daily before breakfast.     . losartan-hydrochlorothiazide (HYZAAR) 100-25 MG per tablet Take 1 tablet by mouth daily.    . Magnesium 250 MG TABS Take 250 mg by mouth daily before supper.    . mirabegron ER (MYRBETRIQ) 25 MG TB24 tablet Take 25 mg by mouth  at bedtime.     . Multiple Vitamin (MULTIVITAMIN WITH MINERALS) TABS tablet Take 1 tablet by mouth daily before supper. Centrum Silver     . pantoprazole (PROTONIX) 40 MG tablet Take 40 mg by mouth daily.    . potassium gluconate 595 MG TABS Take 595 mg by mouth daily before supper.     Marland Kitchen PRESCRIPTION MEDICATION Apply 1 application topically See admin instructions. C-Wart Cream compounded at [place]: Cimetidine 2%, Deoxy-d-glucose 0.2%, Fluorouracil powder 5%, salicylic acid 15 %.  Apply to bottom of feet twice daily.    . Probiotic Product (PROBIOTIC PO) Take 1 tablet by mouth daily after lunch.     . pyridOXINE (VITAMIN B-6) 100 MG tablet Take 100 mg by mouth daily before supper.     Marland Kitchen rOPINIRole (REQUIP) 1 MG tablet Take 1 mg by mouth at bedtime.  0  . Simethicone (EQ GAS RELIEF PO) Take 1 tablet by mouth daily after lunch.     . simvastatin (ZOCOR) 40 MG tablet Take 40 mg by mouth daily with supper.     . Tetrahydrozoline HCl (VISINE OP) Place 1 drop into both eyes daily.    . vitamin B-12 (CYANOCOBALAMIN) 500 MCG tablet Take 500 mcg by mouth daily.     No current facility-administered medications for this visit.     Allergies as of 01/18/2016 - Review Complete 01/18/2016  Allergen Reaction Noted  . Codeine Nausea And Vomiting 01/03/2014  . Darvon [propoxyphene hcl] Nausea And Vomiting 08/28/2011  . Flagyl [metronidazole] Nausea And Vomiting 08/28/2011    Vitals: BP 134/74 (BP Location: Right Arm, Patient Position: Sitting, Cuff  Size: Normal)   Pulse 86   Ht 5\' 2"  (1.575 m)   Wt 159 lb (72.1 kg)   BMI 29.08 kg/m  Last Weight:  Wt Readings from Last 1 Encounters:  01/18/16 159 lb (72.1 kg)   Last Height:   Ht Readings from Last 1 Encounters:  01/18/16 5\' 2"  (1.575 m)    Physical exam: Exam: Gen: NAD, conversant, well nourised, obese, well groomed                     CV: RRR, no MRG. No Carotid Bruits. No peripheral edema, warm, nontender Eyes: Conjunctivae clear without exudates or hemorrhage  Neuro: Detailed Neurologic Exam  Speech:    Speech is normal; fluent and spontaneous with normal comprehension.  Cognition:  MMSE - Mini Mental State Exam 01/18/2016  Orientation to time 5  Orientation to Place 5  Registration 3  Attention/ Calculation 5  Recall 3  Language- name 2 objects 2  Language- repeat 1  Language- follow 3 step command 3  Language- read & follow direction 1  Write a sentence 1  Copy design 1  Total score 30      The patient is oriented to person, place, and time;     recent and remote memory intact;     language fluent;     normal attention, concentration,     fund of knowledge Cranial Nerves:    The pupils are equal, round, and reactive to light. The fundi are normal and spontaneous venous pulsations are present. Visual fields are full to finger confrontation. Extraocular movements are intact. Trigeminal sensation is intact and the muscles of mastication are normal. The face is symmetric. The palate elevates in the midline. Hearing intact. Voice is normal. Shoulder shrug is normal. The tongue has normal motion without fasciculations.   Coordination:  Normal finger to nose and heel to shin. Normal rapid alternating movements.   Gait:    Heel-toe and tandem gait are normal.   Motor Observation:    No asymmetry, no atrophy, and no involuntary movements noted. Tone:    Normal muscle tone.    Posture:    Posture is normal. normal erect    Strength:    Strength is  V/V in the upper and lower limbs.      Sensation: intact to LT     Reflex Exam:  DTR's:    Deep tendon reflexes in the upper and lower extremities are normal bilaterally.   Toes:    The toes are downgoing bilaterally.   Clonus:    Clonus is absent.      Assessment/Plan:  65 year old female with 3-4 months of progressive memory changes in the setting of stress. Also dizziness mostly in the morning with movement. MMSE normal 30/30.  Memory loss: Likely due to normal cognitive aging as well as recent stress and fatigue. MRI of the brain showed moderate chronic small vessel ischemic disease, advise control of vascular risk factors.  Labs: B12, thyroid, rpr, hiv  If symptoms continue can consider formal neurocognitive testing.  For her dizziness I recommended vestibular therapy  We'll follow clinically   Sarina Ill, MD  [facility] [address] [place], [place] [address]  Phone [phone] Fax [phone]

## 2016-04-15 ENCOUNTER — Ambulatory Visit (INDEPENDENT_AMBULATORY_CARE_PROVIDER_SITE_OTHER): Payer: Medicare Other | Admitting: Physician Assistant

## 2016-04-15 ENCOUNTER — Encounter: Payer: Self-pay | Admitting: Physician Assistant

## 2016-04-15 VITALS — BP 148/78 | HR 70 | Ht 61.5 in | Wt 158.0 lb

## 2016-04-15 DIAGNOSIS — R140 Abdominal distension (gaseous): Secondary | ICD-10-CM | POA: Diagnosis not present

## 2016-04-15 DIAGNOSIS — K5900 Constipation, unspecified: Secondary | ICD-10-CM | POA: Diagnosis not present

## 2016-04-15 MED ORDER — LINACLOTIDE 72 MCG PO CAPS: 72.0000 ug | ORAL_CAPSULE | Freq: Every day | ORAL | 0 refills | Status: DC

## 2016-04-15 NOTE — Patient Instructions (Addendum)
If you are age 66 or older, your body mass index should be between 23-30. Your Body mass index is 29.37 kg/m. If this is out of the aforementioned range listed, please consider follow up with your Primary Care Provider.  If you are age 75 or younger, your body mass index should be between 19-25. Your Body mass index is 29.37 kg/m. If this is out of the aformentioned range listed, please consider follow up with your Primary Care Provider.   We have sent the following medications to your pharmacy for you to pick up at your convenience:  [place] have been referred to Physical Therapy and they will contact you with an appointment.  You have been given a handout of a high fiber diet.  Thank you.   High-Fiber Diet Fiber, also called dietary fiber, is a type of carbohydrate found in fruits, vegetables, whole grains, and beans. A high-fiber diet can have many health benefits. Your health care provider may recommend a high-fiber diet to help:  Prevent constipation. Fiber can make your bowel movements more regular.  Lower your cholesterol.  Relieve hemorrhoids, uncomplicated diverticulosis, or irritable bowel syndrome.  Prevent overeating as part of a weight-loss plan.  Prevent heart disease, type 2 diabetes, and certain cancers. What is my plan? The recommended daily intake of fiber includes:  38 grams for men under age 12.  41 grams for men over age 41.  16 grams for women under age 33.  28 grams for women over age 76. You can get the recommended daily intake of dietary fiber by eating a variety of fruits, vegetables, grains, and beans. Your health care provider may also recommend a fiber supplement if it is not possible to get enough fiber through your diet. What do I need to know about a high-fiber diet?  Fiber supplements have not been widely studied for their effectiveness, so it is better to get fiber through food sources.  Always check the fiber content on thenutrition  facts label of any prepackaged food. Look for foods that contain at least 5 grams of fiber per serving.  Ask your dietitian if you have questions about specific foods that are related to your condition, especially if those foods are not listed in the following section.  Increase your daily fiber consumption gradually. Increasing your intake of dietary fiber too quickly may cause bloating, cramping, or gas.  Drink plenty of water. Water helps you to digest fiber. What foods can I eat? Grains  Whole-grain breads. Multigrain cereal. Oats and oatmeal. Brown rice. Barley. Bulgur wheat. [place]. Bran muffins. Popcorn. Rye wafer crackers. Vegetables  Sweet potatoes. Spinach. Kale. Artichokes. Cabbage. Broccoli. Green peas. Carrots. Squash. Fruits  Berries. Pears. Apples. Oranges. Avocados. Prunes and raisins. Dried figs. Meats and Other Protein Sources  [place], kidney, pinto, and soy beans. Split peas. Lentils. Nuts and seeds. Dairy  Fiber-fortified yogurt. Beverages  Fiber-fortified soy milk. Fiber-fortified orange juice. Other  Fiber bars. The items listed above may not be a complete list of recommended foods or beverages. Contact your dietitian for more options.  What foods are not recommended? Grains  White bread. Pasta made with refined flour. White rice. Vegetables  Fried potatoes. Canned vegetables. Well-cooked vegetables. Fruits  Fruit juice. Cooked, strained fruit. Meats and Other Protein Sources  Fatty cuts of meat. Fried Sales executive or fried fish. Dairy  Milk. Yogurt. Cream cheese. Sour cream. Beverages  Soft drinks. Other  Cakes and pastries. Butter and oils. The items listed above may not  be a complete list of foods and beverages to avoid. Contact your dietitian for more information.  What are some tips for including high-fiber foods in my diet?  Eat a wide variety of high-fiber foods.  Make sure that half of all grains consumed each day are whole grains.  Replace breads  and cereals made from refined flour or white flour with whole-grain breads and cereals.  Replace white rice with brown rice, bulgur wheat, or millet.  Start the day with a breakfast that is high in fiber, such as a cereal that contains at least 5 grams of fiber per serving.  Use beans in place of meat in soups, salads, or pasta.  Eat high-fiber snacks, such as berries, raw vegetables, nuts, or popcorn. This information is not intended to replace advice given to you by your health care provider. Make sure you discuss any questions you have with your health care provider. Document Released: 01/24/2005 Document Revised: 07/02/2015 Document Reviewed: 07/09/2013 Elsevier Interactive Patient Education  2017 [employer].

## 2016-04-15 NOTE — Progress Notes (Addendum)
Chief Complaint: Constipation, incomplete defecation  HPI:  Vicki Page is a 66 year old Caucasian female with past medical history of anxiety, Barrett's esophagus, DVT maintained on Plavix, GERD, hyperlipidemia, hypertension, IBS and others listed below, who was referred to me by Shirline Frees, MD for a complaint of incomplete defecation and bloating .     Patient follows with Dr. Hilarie Fredrickson and was last seen in clinic 10/14/15. At that time, it was noted that the patient had an EGD and colonoscopy performed in September 2016. EGD showed short segment Barrett's esophagus was otherwise normal. Biopsy showed Barrett's esophagus without dysplasia. There is mild chronic inflammation negative for H. pylori this time. Colonoscopy revealed areas of resolving inflammation in the ascending colon near the ileocecal valve. There was moderate diverticulosis from transverse to sigmoid colon and a 5 mm sigmoid polyp was removed and found to be tubular adenoma. Biopsies from the right colon showed findings consistent with chronic/resolving ischemic injury. At time of that office visit patient was having complaint of incomplete evacuation. It was thought this may be related to pelvic floor dysfunction. It was recommended she begin fiber. She was told to be more consistent with her probiotic. Repeat EGD was recommended September 2019 for surveillance and repeat colonoscopy was recommended in 2022.   Today, the patient presents to clinic and tells me that "nothing has changed" since she saw Dr. Hilarie Fredrickson last. She still experiences incomplete bowel movements and tells me that she has 2-3 bowel movements a day which are "itty bitty pieces", and she never feels empty. The patient also describes that she has to "wipe and wipe and wipe", before she feels clean. Sometimes "I can go through a whole roll of toilet paper", before she feels clean and sometimes she tells me she has to bathe in order to feel clean after a bowel movement.  Patient cannot remember the last time she had a bowel movement that wasn't "so much trouble". She has constant bloating and gas so much so that it embarrasses her. She also has some associated nausea off and on. Patient has tried increasing fiber in her diet with a little over a teaspoon of a generic Metamucil on a daily basis and has been taking a generic probiotics since seeing Dr. Hilarie Fredrickson. She also drinks water at least 32 ounces a day. The patient is frustrated with her continued symptoms.   Patient denies fever, chills, blood in her stool, melena, weight loss, fatigue, anorexia, vomiting, heartburn, reflux or abdominal pain.   Past Medical History:  Diagnosis Date  . Adenomatous colon polyp   . 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 Anxiety   . Arthritis   . Atherosclerosis   . Barrett's esophagus   . Carotid artery occlusion   . Diverticulosis   . DVT (deep venous thrombosis) ([place])   . Fall at home May 30, 2014   Pt slipped in tub- hurt left hip and right shoulder  . GERD (gastroesophageal reflux disease)   . H. pylori infection   . Hyperlipidemia   . Hypertension   . Hyperthyroidism   . IBS (irritable bowel syndrome)   . Osteoarthritis   . RLS (restless legs syndrome)   . Ulcer of the stomach and intestine     Past Surgical History:  Procedure Laterality Date  . ANEURYSM COILING  July 26, 2010   stent  . BRAIN SURGERY     Per patient " Left side behind eyes  . INSERTION OF ILIAC STENT  10/04/2012   Procedure: INSERTION OF ILIAC STENT;  Surgeon: Jettie Booze, MD;  Location: [facility] CATH LAB;  Service: Cardiovascular;;  Left External Iliac Artery  . IR GENERIC HISTORICAL  11/03/2015   IR ANGIO VERTEBRAL SEL VERTEBRAL UNI L MOD SED 11/03/2015 Luanne Bras, MD [facility]-INTERV RAD  . IR GENERIC HISTORICAL  11/03/2015   IR ANGIO INTRA EXTRACRAN SEL COM CAROTID INNOMINATE BILAT MOD SED 11/03/2015 Luanne Bras, MD [facility]INTER[facility]  . IR GENERIC HISTORICAL  11/03/2015   IR ANGIO  VERTEBRAL SEL SUBCLAVIAN INNOMINATE UNI R MOD SED 11/03/2015 Luanne Bras, MD [facility]  . IR GENERIC HISTORICAL  11/19/2015   IR RADIOLOGIST EVAL & MGMT 11/19/2015 [facility]INTER[facility]  . LEG SURGERY    . LOWER EXTREMITY ANGIOGRAM Left 10-04-12   and Left iliac stent  . LOWER EXTREMITY ANGIOGRAM N/A 10/04/2012   Procedure: LOWER EXTREMITY ANGIOGRAM;  Surgeon: Jettie Booze, MD;  Location: [facility] CATH LAB;  Service: Cardiovascular;  Laterality: N/A;  . TUBAL LIGATION      Current Outpatient Prescriptions  Medication Sig Dispense Refill  . acetaminophen (TYLENOL) 500 MG tablet Take 1,000 mg by mouth 2 (two) times daily as needed for pain.     Marland Kitchen aspirin EC 81 MG tablet Take 81 mg by mouth daily.    Marland Kitchen buPROPion (WELLBUTRIN SR) 150 MG 12 hr tablet Take 150 mg by mouth 2 (two) times daily.    . Calcium-Vitamin D (CALTRATE 600 PLUS-VIT D PO) Take 1 tablet by mouth daily before supper.     . Cholecalciferol (VITAMIN D) 2000 UNITS CAPS Take 2,000 Units by mouth daily before supper.     . clopidogrel (PLAVIX) 75 MG tablet Take 1 tablet (75 mg total) by mouth daily with breakfast. 30 tablet 2  . levothyroxine (SYNTHROID, LEVOTHROID) 25 MCG tablet Take 25 mcg by mouth daily before breakfast.     . losartan-hydrochlorothiazide (HYZAAR) 100-25 MG per tablet Take 1 tablet by mouth daily.    . Magnesium 250 MG TABS Take 250 mg by mouth daily before supper.    . mirabegron ER (MYRBETRIQ) 25 MG TB24 tablet Take 25 mg by mouth at bedtime.     . Multiple Vitamin (MULTIVITAMIN WITH MINERALS) TABS tablet Take 1 tablet by mouth daily before supper. Centrum Silver     . pantoprazole (PROTONIX) 40 MG tablet Take 40 mg by mouth daily.    . potassium gluconate 595 MG TABS Take 595 mg by mouth daily before supper.     Marland Kitchen PRESCRIPTION MEDICATION Apply 1 application topically See admin instructions. C-Wart Cream compounded at [place]: Cimetidine 2%, Deoxy-d-glucose 0.2%, Fluorouracil powder 5%, salicylic acid  15 %.  Apply to bottom of feet twice daily.    . Probiotic Product (PROBIOTIC PO) Take 1 tablet by mouth daily after lunch.     . pyridOXINE (VITAMIN B-6) 100 MG tablet Take 100 mg by mouth daily before supper.     Marland Kitchen rOPINIRole (REQUIP) 1 MG tablet Take 1 mg by mouth at bedtime.  0  . Simethicone (EQ GAS RELIEF PO) Take 1 tablet by mouth daily after lunch.     . simvastatin (ZOCOR) 40 MG tablet Take 40 mg by mouth daily with supper.     . Tetrahydrozoline HCl (VISINE OP) Place 1 drop into both eyes daily.    . vitamin B-12 (CYANOCOBALAMIN) 500 MCG tablet Take 500 mcg by mouth daily.     No current facility-administered medications for this visit.  Allergies as of 04/15/2016 - Review Complete 01/18/2016  Allergen Reaction Noted  . Codeine Nausea And Vomiting 01/03/2014  . Darvon [propoxyphene hcl] Nausea And Vomiting 08/28/2011  . Flagyl [metronidazole] Nausea And Vomiting 08/28/2011    Family History  Problem Relation Age of Onset  . Deep vein thrombosis Mother   . Diabetes Mother     Amputation  . Heart disease Mother     Heart Disease before age 27  . Hyperlipidemia Mother   . Hypertension Mother   . Heart attack Mother   . Stroke Mother   . Thyroid disease Mother   . Heart disease Father     Heart Disease before age 52  . Hypertension Father   . Heart attack Father   . Hyperlipidemia Father   . Diabetes Brother   . Heart disease Brother     Heart Disease before age 49  . Hyperlipidemia Brother   . Hypertension Brother   . Heart attack Brother   . COPD Brother     Social History   Social History  . Marital status: Single    Spouse name: N/A  . Number of children: 2  . Years of education: N/A   Occupational History  . Retired    Social History Main Topics  . Smoking status: Light Tobacco Smoker    Packs/day: 0.25    Types: Cigarettes  . Smokeless tobacco: Never Used  . Alcohol use No  . Drug use: No  . Sexual activity: Not on file   Other Topics  Concern  . Not on file   Social History Narrative   Lives alone   Caffeine use: none    Review of Systems:    Constitutional: No weight loss, fever, chills, weakness or fatigue Skin: No rash  Cardiovascular: No chest pain Respiratory: No SOB Gastrointestinal: See HPI and otherwise negative   Physical Exam:  Vital signs: BP (!) 148/78   Pulse 70   Ht 5' 1.5" (1.562 m)   Wt 158 lb (71.7 kg)   SpO2 98%   BMI 29.37 kg/m    Constitutional:   African American female appears to be in NAD, Well developed, Well nourished, alert and cooperative Head:  Normocephalic and atraumatic. Eyes:   PEERL, EOMI. No icterus. Conjunctiva pink. Ears:  Normal auditory acuity. Neck:  Supple Throat: Oral cavity and pharynx without inflammation, swelling or lesion.  Respiratory: Respirations even and unlabored. Lungs clear to auscultation bilaterally.   No wheezes, crackles, or rhonchi.  Cardiovascular: Normal S1, S2. No MRG. Regular rate and rhythm. No peripheral edema, cyanosis or pallor.  Gastrointestinal:  Soft,mild distension, mod generalized ttp No rebound or guarding. Normal bowel sounds. No appreciable masses or hepatomegaly. Rectal:  Not performed.  Msk:  Symmetrical without gross deformities. Without edema, no deformity or joint abnormality.  Neurologic:  Alert and  oriented x4;  grossly normal neurologically.  Skin:   Dry and intact without significant lesions or rashes. Psychiatric: Demonstrates good judgement and reason without abnormal affect or behaviors.  No recent labs or imaging.  Assessment: 1. Constipation: Patient continues to describe incomplete defecation, Dr. Hilarie Fredrickson thought an element of pelvic floor dysfunction, no improvement on a high-fiber diet and probiotic; at this time recommend referral for pelvic floor dysfunction and we will trial a laxative 2. Bloating/gas: Likely related to above  Plan: 1. Provided patient with samples of Linzess 72 mcg daily. Did discuss that  if this is too strong she can use it every other day,  and if it is not strong enough we can discuss a higher dose. 2. Patient told to continue her high fiber diet with at least 25-35 g per day. She should also continue drinking water, at least 6-8 8 ounce glasses of water per day. 3. Patient was referred to Earlie Counts for pelvic floor dysfunction 4. Patient to follow in clinic with me or Dr. Hilarie Fredrickson in the next 3-4 weeks  Ellouise Newer, PA-C [facility] 04/15/2016, 2:31 PM  Cc: Shirline Frees, MD   Addendum: Reviewed and agree with management. Would recommend anorectal manometry to be read by Dr. Silverio Decamp if needed before visit with pelvic floor PT Jerene Bears, MD

## 2016-04-19 ENCOUNTER — Telehealth: Payer: Self-pay | Admitting: *Deleted

## 2016-04-19 DIAGNOSIS — K5902 Outlet dysfunction constipation: Secondary | ICD-10-CM

## 2016-04-19 NOTE — Telephone Encounter (Signed)
Vicki Page, Vicki Page sent to Oda Kilts, CMA; Mauri Pole, MD        Vicki Page,  Recently talked to Dr. Silverio Decamp about this, but we referred patient for suspected element of pelvic floor dysfunction and she would benefit from Rectal manometry before referral. Could you please arrange? Dr. Silverio Decamp told me you would be the one to ask.   Thank you  Vicki Page   Previous Messages

## 2016-04-22 NOTE — Telephone Encounter (Signed)
You have been scheduled to have an anorectal manometry at [facility] on 05/04/2016 at 8:30am. Please arrive 30 minutes prior to your appointment time for registration (1st floor of the hospital-admissions).  Please make certain to use 1 Fleets enema 2 hours prior to coming for your appointment. You can purchase Fleets enemas from the laxative section at your drug store. You should not eat anything during the two hours prior to the procedure. You may take regular medications with small sips of water at least 2 hours prior to the study.  Anorectal manometry is a test performed to evaluate patients with constipation or fecal incontinence. This test measures the pressures of the anal sphincter muscles, the sensation in the rectum, and the neural reflexes that are needed for normal bowel movements.  THE PROCEDURE The test takes approximately 30 minutes to 1 hour. You will be asked to change into a hospital gown. A technician or nurse will explain the procedure to you, take a brief health history, and answer any questions you may have. The patient then lies on his or her left side. A small, flexible tube, about the size of a thermometer, with a balloon at the end is inserted into the rectum. The catheter is connected to a machine that measures the pressure. During the test, the small balloon attached to the catheter may be inflated in the rectum to assess the normal reflex pathways. The nurse or technician may also ask the person to squeeze, relax, and push at various times. The anal sphincter muscle pressures are measured during each of these maneuvers. To squeeze, the patient tightens the sphincter muscles as if trying to prevent anything from coming out. To push or bear down, the patient strains down as if trying to have a bowel movement.

## 2016-05-02 ENCOUNTER — Telehealth: Payer: Self-pay | Admitting: Gastroenterology

## 2016-05-02 NOTE — Telephone Encounter (Signed)
See other phone note  L/M for pt to return my call

## 2016-05-02 NOTE — Telephone Encounter (Signed)
Gave patient all information needed for test told patient to call back if she had any questions

## 2016-05-04 ENCOUNTER — Ambulatory Visit (HOSPITAL_COMMUNITY)
Admission: RE | Admit: 2016-05-04 | Discharge: 2016-05-04 | Disposition: A | Discharge status: Home / Self Care | Payer: Medicare Other | Source: Ambulatory Visit | Attending: Gastroenterology | Admitting: Gastroenterology

## 2016-05-04 ENCOUNTER — Encounter (HOSPITAL_COMMUNITY): Payer: Self-pay | Admitting: Gastroenterology

## 2016-05-04 ENCOUNTER — Encounter (HOSPITAL_COMMUNITY)
Admission: RE | Disposition: A | Discharge status: Home / Self Care | Payer: Self-pay | Source: Ambulatory Visit | Attending: Gastroenterology

## 2016-05-04 DIAGNOSIS — K5900 Constipation, unspecified: Secondary | ICD-10-CM | POA: Diagnosis not present

## 2016-05-04 HISTORY — PX: ANAL RECTAL MANOMETRY: SHX6358

## 2016-05-04 SURGERY — MANOMETRY, ANORECTAL

## 2016-05-04 NOTE — Progress Notes (Signed)
Anal Manometry done per protocol. Pt tolerated well without complication. Report to be sent to Dr. Nandigam. 

## 2016-05-05 DIAGNOSIS — K5900 Constipation, unspecified: Secondary | ICD-10-CM

## 2016-05-13 ENCOUNTER — Other Ambulatory Visit (HOSPITAL_COMMUNITY): Payer: Self-pay | Admitting: Interventional Radiology

## 2016-05-13 DIAGNOSIS — I729 Aneurysm of unspecified site: Secondary | ICD-10-CM

## 2016-05-24 ENCOUNTER — Ambulatory Visit (HOSPITAL_COMMUNITY): Payer: Medicare Other

## 2016-05-24 ENCOUNTER — Ambulatory Visit (HOSPITAL_COMMUNITY)
Admission: RE | Admit: 2016-05-24 | Discharge: 2016-05-24 | Disposition: A | Discharge status: Home / Self Care | Payer: Medicare Other | Source: Ambulatory Visit | Attending: Interventional Radiology | Admitting: Interventional Radiology

## 2016-05-24 DIAGNOSIS — I6521 Occlusion and stenosis of right carotid artery: Secondary | ICD-10-CM | POA: Insufficient documentation

## 2016-05-24 DIAGNOSIS — I729 Aneurysm of unspecified site: Secondary | ICD-10-CM | POA: Insufficient documentation

## 2016-05-24 DIAGNOSIS — I6502 Occlusion and stenosis of left vertebral artery: Secondary | ICD-10-CM | POA: Insufficient documentation

## 2016-05-24 LAB — CREATININE, SERUM
Creatinine, Ser: 1.26 mg/dL — ABNORMAL HIGH (ref 0.44–1.00)
GFR calc Af Amer: 50 mL/min — ABNORMAL LOW (ref >60)
GFR calc non Af Amer: 43 mL/min — ABNORMAL LOW (ref >60)

## 2016-05-24 MED ORDER — GADOBENATE DIMEGLUMINE 529 MG/ML IV SOLN: 15.0000 mL | Freq: Once | INTRAVENOUS | Status: DC

## 2016-05-25 ENCOUNTER — Telehealth (HOSPITAL_COMMUNITY): Payer: Self-pay

## 2016-05-25 NOTE — Telephone Encounter (Signed)
Left message for pt to return call. AW 

## 2016-06-13 ENCOUNTER — Telehealth (HOSPITAL_COMMUNITY): Payer: Self-pay

## 2016-06-13 NOTE — Telephone Encounter (Signed)
Pt agreed to f/u in 6 months with mri. AW 

## 2016-06-16 ENCOUNTER — Encounter: Payer: Self-pay | Admitting: *Deleted

## 2016-06-29 ENCOUNTER — Ambulatory Visit: Payer: Medicare Other | Admitting: Internal Medicine

## 2016-07-06 ENCOUNTER — Other Ambulatory Visit: Payer: Self-pay | Admitting: Family Medicine

## 2016-07-06 DIAGNOSIS — M5416 Radiculopathy, lumbar region: Secondary | ICD-10-CM

## 2016-07-15 ENCOUNTER — Other Ambulatory Visit: Payer: Medicare Other

## 2016-07-18 ENCOUNTER — Ambulatory Visit: Payer: Medicare Other | Admitting: Adult Health

## 2016-07-18 ENCOUNTER — Ambulatory Visit
Admission: RE | Admit: 2016-07-18 | Discharge: 2016-07-18 | Disposition: A | Discharge status: Home / Self Care | Payer: Medicare Other | Source: Ambulatory Visit | Attending: Family Medicine | Admitting: Family Medicine

## 2016-07-18 DIAGNOSIS — M5416 Radiculopathy, lumbar region: Secondary | ICD-10-CM

## 2016-07-22 ENCOUNTER — Other Ambulatory Visit: Payer: Self-pay | Admitting: Family Medicine

## 2016-07-22 DIAGNOSIS — M5412 Radiculopathy, cervical region: Secondary | ICD-10-CM

## 2016-08-06 ENCOUNTER — Ambulatory Visit
Admission: RE | Admit: 2016-08-06 | Discharge: 2016-08-06 | Disposition: A | Discharge status: Home / Self Care | Payer: Medicare Other | Source: Ambulatory Visit | Attending: Family Medicine | Admitting: Family Medicine

## 2016-08-06 DIAGNOSIS — M5412 Radiculopathy, cervical region: Secondary | ICD-10-CM

## 2016-08-11 ENCOUNTER — Ambulatory Visit (INDEPENDENT_AMBULATORY_CARE_PROVIDER_SITE_OTHER): Payer: Medicare Other | Admitting: Internal Medicine

## 2016-08-11 ENCOUNTER — Encounter: Payer: Self-pay | Admitting: Internal Medicine

## 2016-08-11 VITALS — BP 162/80 | HR 73 | Ht 62.0 in | Wt 161.0 lb

## 2016-08-11 DIAGNOSIS — K2270 Barrett's esophagus without dysplasia: Secondary | ICD-10-CM

## 2016-08-11 DIAGNOSIS — K5909 Other constipation: Secondary | ICD-10-CM

## 2016-08-11 MED ORDER — LUBIPROSTONE 24 MCG PO CAPS: ORAL_CAPSULE | ORAL | 0 refills | Status: DC

## 2016-08-11 NOTE — Patient Instructions (Addendum)
Continue your fiber supplement.  Continue on your pantoprazole 40 mg.   Please take Linzess 72 mcg 30 minutes before breakfast for at least 1 week.  [place] know how you are doing at that time.  IF you are having diarrhea or are still constipated you may change to Amitiza 8 mcg twice daily (with meals). Samples of each of these have been given to you by Dr. Hilarie Fredrickson.   If you are age 66 or older, your body mass index should be between 23-30. Your Body mass index is 29.45 kg/m. If this is out of the aforementioned range listed, please consider follow up with your Primary Care Provider.  If you are age 49 or younger, your body mass index should be between 19-25. Your Body mass index is 29.45 kg/m. If this is out of the aformentioned range listed, please consider follow up with your Primary Care Provider.   Please follow up with Dr. Hilarie Fredrickson in 3 to 4 months.

## 2016-08-12 NOTE — Progress Notes (Signed)
   Subjective:    Patient ID: Vicki Page, female    DOB: Sep 22, 1950, 66 y.o.   MRN: [id number]  HPI Vicki Page is a 66 year old female with a history of GERD with Barrett's esophagus, chronic constipation with incomplete defecation, history of colon polyps, DVT on Plavix, hypertension and hyperlipidemia who is here for follow-up. She was seen in March 2018 by Ellouise Newer, PA-C. She was scheduled for anorectal manometry which was performed in March 2018. This was normal with no evidence of dyssynergia defecation.  She has continued to have issues with constipation and incomplete evacuation. She reports she will have stools on most days of the week but they're often hard and small. She has some mild bloating but no abdominal pain. No blood in her stool or melena.  She tried Linzess 72 g daily for 2 days and this resulted in significant loose stools which were limiting to her daily function. She also has continued daily fiber supplementation. Prior MiraLAX and increased water consumption didn't improve symptoms.  Review of Systems As per history of present illness, otherwise negative  Current Medications, Allergies, Past Medical History, Past Surgical History, Family History and Social History were reviewed in [employer] record.     Objective:   Physical Exam BP (!) 162/80   Pulse 73   Ht 5\' 2"  (1.575 m)   Wt 161 lb (73 kg)   BMI 29.45 kg/m  Constitutional: Well-developed and well-nourished. No distress. HEENT: Normocephalic and atraumatic. Oropharynx is clear and moist. Conjunctivae are normal.  No scleral icterus. Neck: Neck supple. Trachea midline. Cardiovascular: Normal rate, regular rhythm and intact distal pulses. No M/R/G Pulmonary/chest: Effort normal and breath sounds normal. No wheezing, rales or rhonchi. Abdominal: Soft, nontender, nondistended. Bowel sounds active throughout. There are no masses palpable. No hepatosplenomegaly. Extremities: no  clubbing, cyanosis, or edema Neurological: Alert and oriented to person place and time. Skin: Skin is warm and dry. Psychiatric: Normal mood and affect. Behavior is normal.  Anorectal mano - reviewed today including with the patient     Assessment & Plan:  66 year old female with a history of GERD with Barrett's esophagus, chronic constipation with incomplete defecation, history of colon polyps, DVT on Plavix, hypertension and hyperlipidemia who is here for follow-up.   1. Chronic constipation with incomplete defecation -- I encouraged her to try Linzess for a full week before we determine if it will truly continue to cause diarrhea. We discussed often patients will have diarrhea on the first several days which will improve with daily use. I advised she use this 30 minutes before breakfast daily. After 1 week if she continues to have loose urgent stools or diarrhea we will discontinue Linzess. If this is the case I recommended Amitiza 8 g twice a day with food. She will call back and let me know after a week how Linzess is working and further decisions can be made on therapy at that time.  2. History of Barrett's esophagus -- continue pantoprazole 40 mg daily. No recent GERD or dysphagia. Repeat endoscopy for surveillance recommended October 2019  3. History of colon polyps -- surveillance colonoscopy recommended in October 2021  25 minutes spent with the patient today. Greater than 50% was spent in counseling and coordination of care with the patient

## 2016-09-07 IMAGING — XA IR ANGIO VETEBRAL SEL VERTEBRAL BILAT MOD SED
1 series · 12 of 24 positions shown · IV contrast (IODINE)
Comparison: none

CLINICAL DATA: Vertebrobasilar insufficiency. Previous history of
endovascular treatment of left internal carotid artery intracranial
aneurysm.

[Series 300: dr. (person_name) · 12 of 99 slices shown]
[im 5/99]
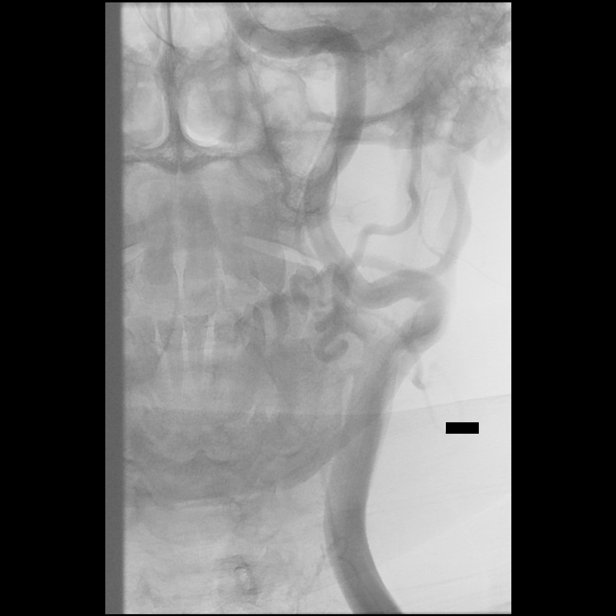
[im 13/99]
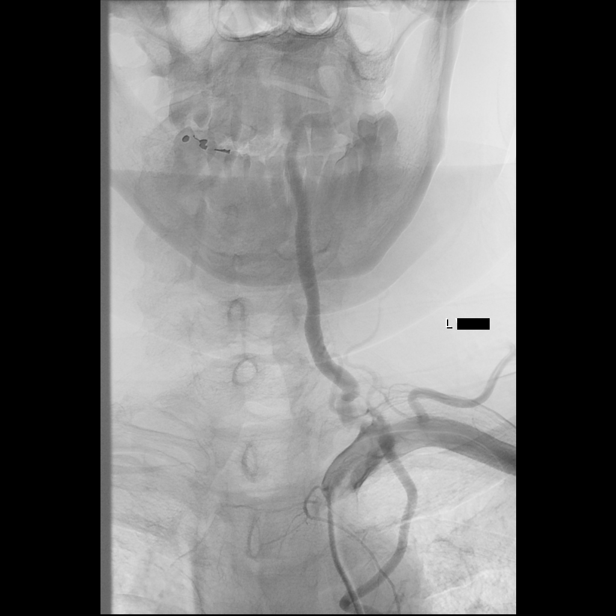
[im 22/99]
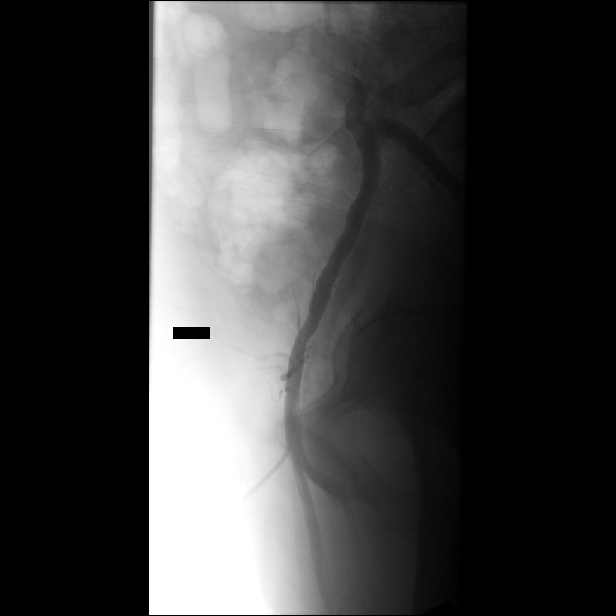
[im 30/99]
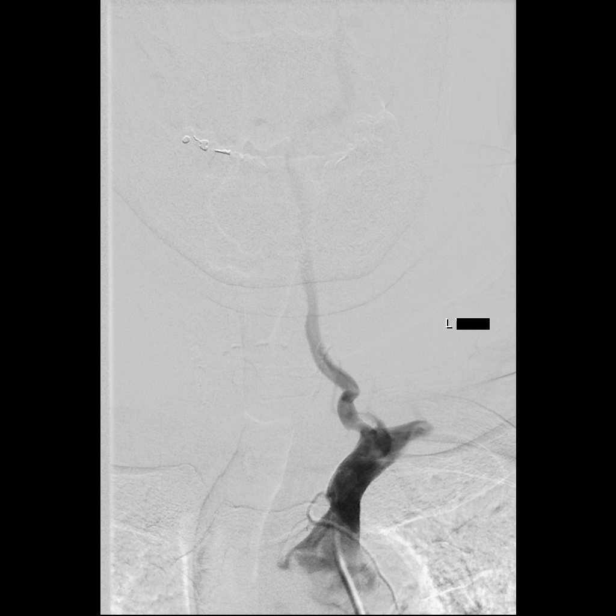
[im 39/99]
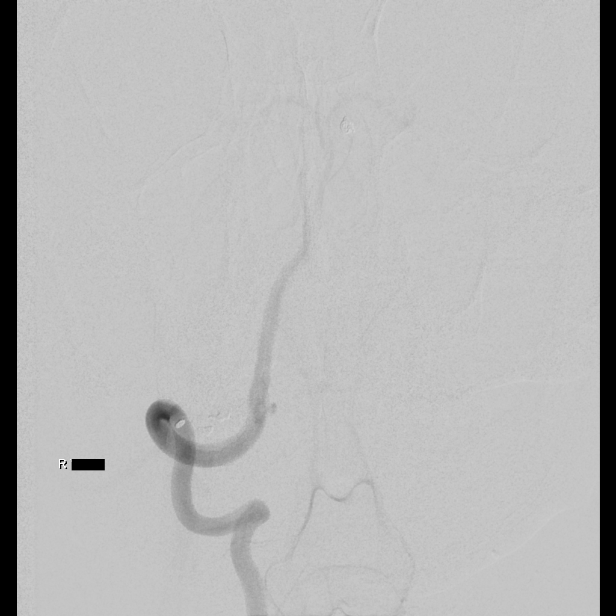
[im 47/99]
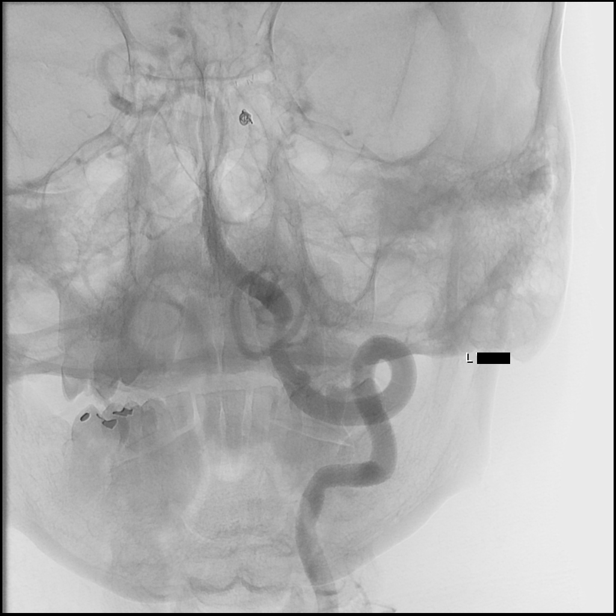
[im 56/99]
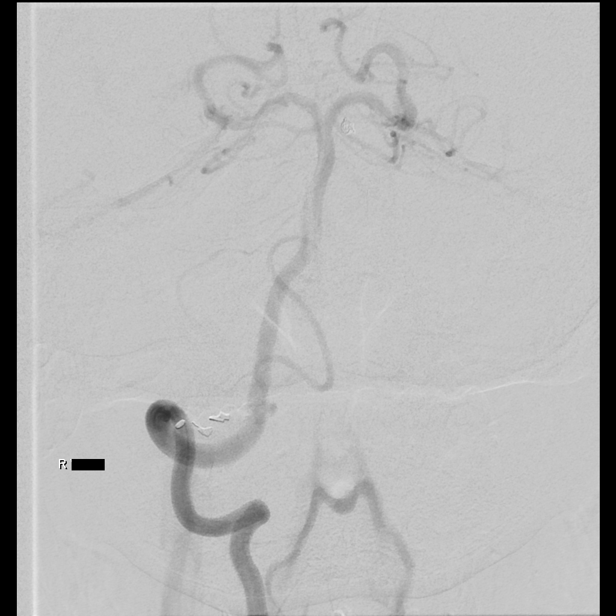
[im 64/99]
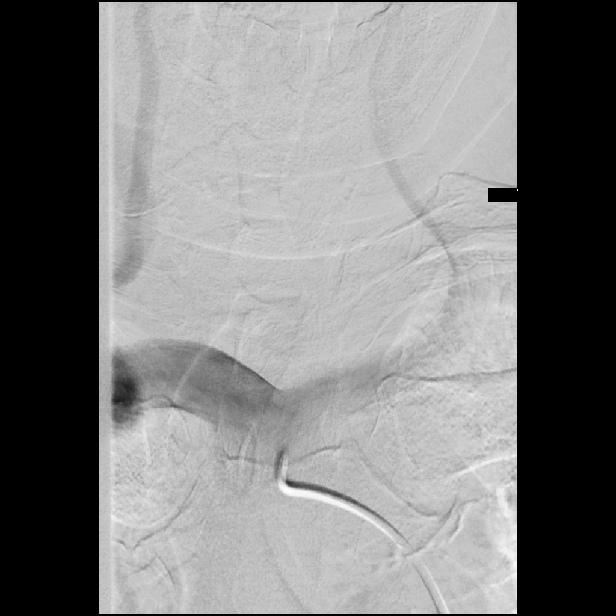
[im 73/99]
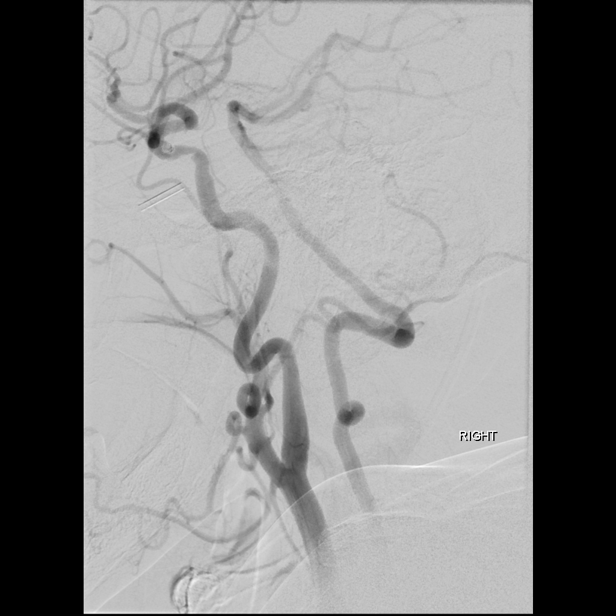
[im 81/99]
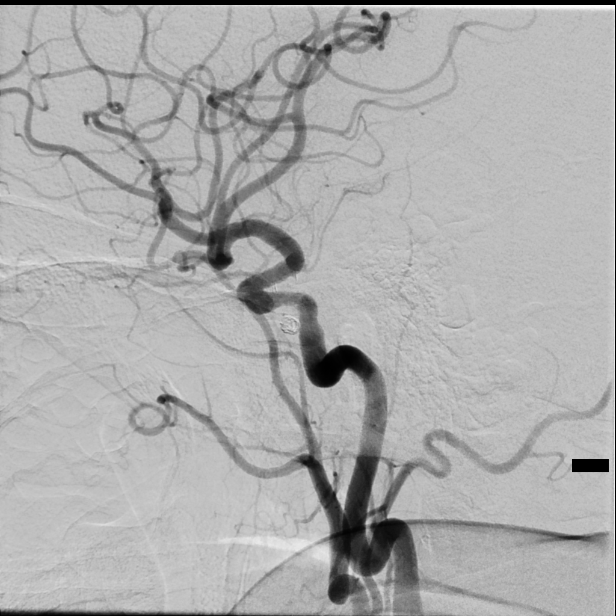
[im 90/99]
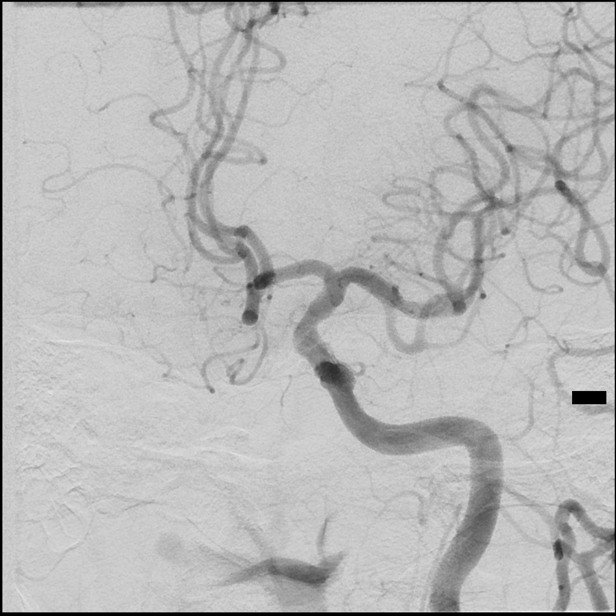
[im 99/99]
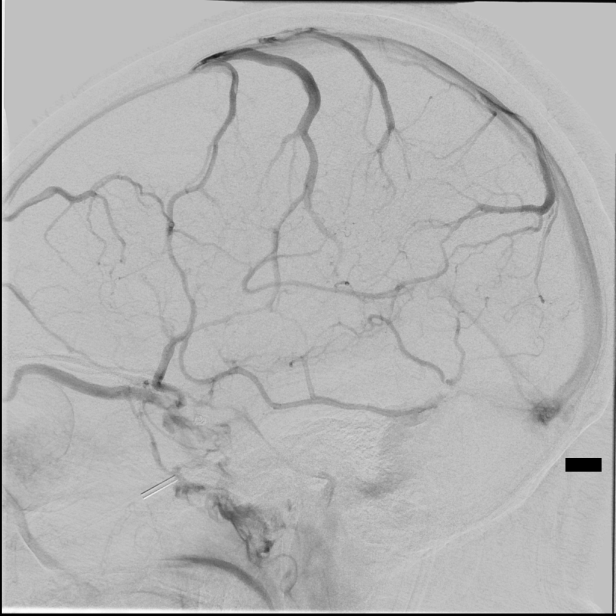

[12 of 24 positions shown; findings below may reference images not displayed]

EXAM:
BILATERAL COMMON CAROTID AND INNOMINATE ANGIOGRAPHY AND BILATERAL
VERTEBRAL ARTERY ANGIOGRAMS

PROCEDURE:
Contrast: Isovue 300 approximately 60 mL.

Anesthesia/Sedation:  Conscious sedation.

Medications: Versed 1 mg IV. Fentanyl 50 mcg IV. Heparin 0266 units
IV.

Following a full explanation of the procedure along with the
potential associated complications, an informed witnessed consent
was obtained.

The right groin was prepped and draped in the usual sterile fashion.
Thereafter using modified Seldinger technique, transfemoral access
into the right common femoral artery was obtained without
difficulty. Over a 0.035 inch guidewire, a 5 French Pinnacle sheath
was inserted. Through this, and also over 0.035 inch guidewire, a 5
French JB 1 catheter was advanced to the aortic arch region and
selectively positioned in the right common carotid artery, the right
vertebral artery, the left common carotid artery and the left
vertebral artery.

There were no acute complications. The patient tolerated the
procedure well.
FINDINGS: The right common carotid arteriogram demonstrates the right external
carotid artery and its major branches to be widely patent.

The right internal carotid artery at the bulb and distally to the
cranial skull base is widely patent with a moderate tortuosity in
the mid cervical right ICA.

At the petrous cavernous junction there is approximately 70%
stenoses with mild post stenotic dilatation.

More distally, the distal cavernous and the supraclinoid segments
opacify normally.

The right middle cerebral artery is seen to opacify into the
capillary and the venous phases.

No angiographic visualization of the right anterior cerebral artery
A1 segment is seen. The right vertebral artery origin is normal.

The vessel is seen to opacify normally to the cranial skull base.
Normal opacification is seen of the right posterior inferior
cerebellar artery and the right vertebrobasilar junction.

The basilar artery, the posterior cerebral arteries, superior
cerebellar arteries and the anterior-inferior cerebellar arteries
are seen to opacify normally into the capillary and the venous
phases. Non-opacified blood is seen in the basilar artery from the
contralateral vertebral artery.

The left vertebral artery origin demonstrates approximately 20%
narrowing just distal to its origin.

Distal to this the vessel opacifies normally to the cranial skull
base.

Just proximal to the origin of the left posterior inferior
cerebellar artery there is again noted approximately 80% stenosis
secondary to a smooth circumferential plaque.

This is associated with mild post stenotic dilatation.

Distal to this, the basilar artery, the posterior cerebral arteries,
superior cerebellar arteries and the anterior-inferior cerebellar
arteries opacify normally into the capillary and the venous phases.
Non-opacified blood is seen in the basilar artery from the
contralateral vertebral artery.

The left common carotid arteriogram demonstrates the left external
carotid artery and its major branches to be widely patent.

The left internal carotid artery at the bulb has a 50% stenosis by
NASCET criteria.

This is due to a smooth shallow plaque along the posterior wall.

There is mild tortuosity involving the proximal left internal
carotid artery without evidence of kinking.

Distal to this, the vessel is seen to opacify normally to the
cranial skull base.

The petrous, the cavernous and the supraclinoid segments opacify
normally.

Again demonstrated is the pack of coils obliterating the previously
endovascular treated superior hypophyseal region aneurysm with wide
patency of the stent across the neck of the aneurysm.

The left middle cerebral artery and the left anterior cerebral
artery are seen to opacify into the capillary and the venous phases.
There is approximately 50% stenosis of the left middle cerebral
artery just distal to the origin of the anterior temporal branch.

Prompt opacification via the anterior communicating artery of the
right anterior cerebral artery A2 segment is seen.
IMPRESSION: Angiographically 80% narrowing of the left vertebrobasilar junction
just proximal to the left posterior inferior cerebellar artery
slightly worse compared to the previous arteriogram.

Approximately 70% stenoses of the right internal carotid artery at
the petrous cavernous junction again mildly worse than the previous
arteriogram.

Approximately 50% stenosis of the mid M1 segment of the left middle
cerebral artery.

Obliterated left internal carotid artery superior hypophyseal region
aneurysm without evidence of coil compaction or recanalization.

Approximately 50% stenosis by the NASCET criteria of the left
internal carotid artery at the bulb. The angiographic findings were
reviewed with the patient and the patient's daughter.

The patient was again advised to stop smoking. She was advised to
continue taking her secondary stroke prevention medications.

She and her daughter will return in follow-up to the clinic to
discuss further management considerations regarding the above
findings.

## 2016-09-17 ENCOUNTER — Encounter (HOSPITAL_COMMUNITY): Payer: Self-pay | Admitting: *Deleted

## 2016-09-17 DIAGNOSIS — R112 Nausea with vomiting, unspecified: Secondary | ICD-10-CM | POA: Insufficient documentation

## 2016-09-17 DIAGNOSIS — Z79899 Other long term (current) drug therapy: Secondary | ICD-10-CM | POA: Insufficient documentation

## 2016-09-17 DIAGNOSIS — Z86718 Personal history of other venous thrombosis and embolism: Secondary | ICD-10-CM | POA: Diagnosis not present

## 2016-09-17 DIAGNOSIS — I10 Essential (primary) hypertension: Secondary | ICD-10-CM | POA: Diagnosis not present

## 2016-09-17 DIAGNOSIS — K581 Irritable bowel syndrome with constipation: Secondary | ICD-10-CM | POA: Insufficient documentation

## 2016-09-17 DIAGNOSIS — F17210 Nicotine dependence, cigarettes, uncomplicated: Secondary | ICD-10-CM | POA: Insufficient documentation

## 2016-09-17 DIAGNOSIS — K2270 Barrett's esophagus without dysplasia: Secondary | ICD-10-CM | POA: Diagnosis not present

## 2016-09-17 DIAGNOSIS — Z7901 Long term (current) use of anticoagulants: Secondary | ICD-10-CM | POA: Insufficient documentation

## 2016-09-17 DIAGNOSIS — E0590 Thyrotoxicosis, unspecified without thyrotoxic crisis or storm: Secondary | ICD-10-CM | POA: Insufficient documentation

## 2016-09-17 LAB — URINALYSIS, ROUTINE W REFLEX MICROSCOPIC
Bilirubin Urine: NEGATIVE
Glucose, UA: NEGATIVE mg/dL
Hgb urine dipstick: NEGATIVE
Ketones, ur: 5 mg/dL — AB
Leukocytes, UA: NEGATIVE
Nitrite: NEGATIVE
Protein, ur: NEGATIVE mg/dL
Specific Gravity, Urine: 1.017 (ref 1.005–1.030)
pH: 5 (ref 5.0–8.0)

## 2016-09-17 NOTE — ED Triage Notes (Signed)
Nausea vomiting all day  No pain just bloating

## 2016-09-18 ENCOUNTER — Encounter (HOSPITAL_COMMUNITY): Payer: Self-pay

## 2016-09-18 ENCOUNTER — Emergency Department (HOSPITAL_COMMUNITY): Payer: Medicare Other

## 2016-09-18 ENCOUNTER — Emergency Department (HOSPITAL_COMMUNITY)
Admission: EM | Admit: 2016-09-18 | Discharge: 2016-09-18 | Disposition: A | Discharge status: Home / Self Care | Payer: Medicare Other | Attending: Emergency Medicine | Admitting: Emergency Medicine

## 2016-09-18 DIAGNOSIS — R112 Nausea with vomiting, unspecified: Secondary | ICD-10-CM

## 2016-09-18 LAB — I-STAT CG4 LACTIC ACID, ED
Lactic Acid, Venous: 1.91 mmol/L — ABNORMAL HIGH (ref 0.5–1.9)
Lactic Acid, Venous: 2.53 mmol/L (ref 0.5–1.9)
Lactic Acid, Venous: 2.25 mmol/L (ref 0.5–1.9)

## 2016-09-18 LAB — CBC
HCT: 47.9 % — ABNORMAL HIGH (ref 36.0–46.0)
Hemoglobin: 15.9 g/dL — ABNORMAL HIGH (ref 12.0–15.0)
MCH: 27.8 pg (ref 26.0–34.0)
MCHC: 33.2 g/dL (ref 30.0–36.0)
MCV: 83.7 fL (ref 78.0–100.0)
Platelets: 231 10*3/uL (ref 150–400)
RBC: 5.72 MIL/uL — ABNORMAL HIGH (ref 3.87–5.11)
RDW: 15 % (ref 11.5–15.5)
WBC: 5.5 10*3/uL (ref 4.0–10.5)

## 2016-09-18 LAB — COMPREHENSIVE METABOLIC PANEL
ALT: 17 U/L (ref 14–54)
AST: 25 U/L (ref 15–41)
Albumin: 3.7 g/dL (ref 3.5–5.0)
Alkaline Phosphatase: 81 U/L (ref 38–126)
Anion gap: 10 (ref 5–15)
BUN: 15 mg/dL (ref 6–20)
CO2: 26 mmol/L (ref 22–32)
Calcium: 9.6 mg/dL (ref 8.9–10.3)
Chloride: 104 mmol/L (ref 101–111)
Creatinine, Ser: 1.18 mg/dL — ABNORMAL HIGH (ref 0.44–1.00)
GFR calc Af Amer: 54 mL/min — ABNORMAL LOW (ref >60)
GFR calc non Af Amer: 47 mL/min — ABNORMAL LOW (ref >60)
Glucose, Bld: 139 mg/dL — ABNORMAL HIGH (ref 65–99)
Potassium: 4.1 mmol/L (ref 3.5–5.1)
Sodium: 140 mmol/L (ref 135–145)
Total Bilirubin: 0.8 mg/dL (ref 0.3–1.2)
Total Protein: 7 g/dL (ref 6.5–8.1)

## 2016-09-18 LAB — TROPONIN I: Troponin I: <0.03 ng/mL (ref <0.03)

## 2016-09-18 LAB — LIPASE, BLOOD: Lipase: 24 U/L (ref 11–51)

## 2016-09-18 MED ORDER — SODIUM CHLORIDE 0.9 % IV BOLUS (SEPSIS)
1000.0000 mL | Freq: Once | INTRAVENOUS | Status: AC
  Administered 2016-09-18: 1000 mL via INTRAVENOUS

## 2016-09-18 MED ORDER — ONDANSETRON 4 MG PO TBDP: 4.0000 mg | ORAL_TABLET | Freq: Three times a day (TID) | ORAL | 0 refills | Status: DC | PRN

## 2016-09-18 MED ORDER — IOPAMIDOL (ISOVUE-300) INJECTION 61%
INTRAVENOUS | Status: AC
  Administered 2016-09-18: 100 mL
  Filled 2016-09-18: qty 100

## 2016-09-18 MED ORDER — ONDANSETRON HCL 4 MG/2ML IJ SOLN
4.0000 mg | Freq: Once | INTRAMUSCULAR | Status: AC
  Administered 2016-09-18: 4 mg via INTRAVENOUS
  Filled 2016-09-18: qty 2

## 2016-09-18 MED ORDER — PROMETHAZINE HCL 25 MG/ML IJ SOLN
12.5000 mg | Freq: Once | INTRAMUSCULAR | Status: AC
  Administered 2016-09-18: 12.5 mg via INTRAVENOUS
  Filled 2016-09-18: qty 1

## 2016-09-18 NOTE — ED Notes (Signed)
Pt understood dc material. NAD noted. Script given at dc  

## 2016-09-18 NOTE — Discharge Instructions (Signed)
Take a nausea medication as prescribed. Talk to your doctor about stopping the diclofenac and baclofen as they may be irritating to your stomach. Avoid alcohol, NSAID medications, caffeine, spicy foods. Return to the ED if you develop new or worsening symptoms.

## 2016-09-18 NOTE — ED Provider Notes (Signed)
[facility] DEPT Provider Note   CSN: [id number] Arrival date & time: 09/17/16  2253  By signing my name below, I, Dora Sims, attest that this documentation has been prepared under the direction and in the presence of physician practitioner, Rancour, Annie Main, MD. Electronically Signed: Dora Sims, Scribe. 09/18/2016. 1:18 AM.  History   Chief Complaint Chief Complaint  Patient presents with  . Emesis   The history is provided by the patient. No language interpreter was used.    HPI Comments: Vicki Page is a 66 y.o. female with PMHx including GERD, DVT on Plavix and IBS who presents to the Emergency Department for evaluation of persistent nausea and vomiting that began yesterday morning. She was initially vomiting non-bloody and non-bilious material but has mostly been dry heaving over the last several hours. There are no associated symptoms or alleviating factors noted. Patient has baseline constipation secondary to IBS without any acute changes. She last had a small BM a few hours ago which was normal for her. Patient started taking diclofenac and baclofen 8 days ago and believes these medications could be causing her symptoms. Patient has no known ill contacts with similar symptoms. She uses Protonix for GERD. She has a history of tubal ligation without any other abdominal surgeries. She is on Plavix due to a history of DVT. Patient denies hematochezia, abdominal pain, fevers, chills, dyspnea, chest pain, or any other associated symptoms.  Past Medical History:  Diagnosis Date  . Adenomatous colon polyp   . 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 Anxiety   . Arthritis   . Atherosclerosis   . Barrett's esophagus   . Carotid artery occlusion   . DDD (degenerative disc disease), lumbar   . Diverticulosis   . DVT (deep venous thrombosis) ([place])   . Fall at home May 30, 2014   Pt slipped in tub- hurt left hip and right shoulder  . GERD (gastroesophageal reflux disease)   . H.  pylori infection   . Hyperlipidemia   . Hypertension   . Hyperthyroidism   . IBS (irritable bowel syndrome)   . Osteoarthritis   . RLS (restless legs syndrome)   . Tubular adenoma of colon   . Ulcer of the stomach and intestine     Patient Active Problem List   Diagnosis Date Noted  . Constipation   . Brain aneurysm   . Carotid stenosis   . Cephalalgia   . Smoker 01/03/2014  . Occlusion and stenosis of carotid artery without mention of cerebral infarction 01/26/2012  . Aneurysm ([place])   . Hyperthyroidism   . Hyperlipidemia     Past Surgical History:  Procedure Laterality Date  . ANAL RECTAL MANOMETRY N/A 05/04/2016   Procedure: ANO RECTAL MANOMETRY;  Surgeon: Mauri Pole, MD;  Location: [facility] ENDOSCOPY;  Service: Endoscopy;  Laterality: N/A;  . ANEURYSM COILING  July 26, 2010   stent  . BRAIN SURGERY     Per patient " Left side behind eyes  . INSERTION OF ILIAC STENT  10/04/2012   Procedure: INSERTION OF ILIAC STENT;  Surgeon: Jettie Booze, MD;  Location: [facility] CATH LAB;  Service: Cardiovascular;;  Left External Iliac Artery  . IR GENERIC HISTORICAL  11/03/2015   IR ANGIO VERTEBRAL SEL VERTEBRAL UNI L MOD SED 11/03/2015 Luanne Bras, MD [facility]-INTERV RAD  . IR GENERIC HISTORICAL  11/03/2015   IR ANGIO INTRA EXTRACRAN SEL COM CAROTID INNOMINATE BILAT MOD SED 11/03/2015 Luanne Bras, MD [facility]  .  IR GENERIC HISTORICAL  11/03/2015   IR ANGIO VERTEBRAL SEL SUBCLAVIAN INNOMINATE UNI R MOD SED 11/03/2015 Luanne Bras, MD [facility]  . IR GENERIC HISTORICAL  11/19/2015   IR RADIOLOGIST EVAL & MGMT 11/19/2015 [facility]  . LEG SURGERY    . LOWER EXTREMITY ANGIOGRAM Left 10-04-12   and Left iliac stent  . LOWER EXTREMITY ANGIOGRAM N/A 10/04/2012   Procedure: LOWER EXTREMITY ANGIOGRAM;  Surgeon: Jettie Booze, MD;  Location: [facility] CATH LAB;  Service: Cardiovascular;  Laterality: N/A;  . TUBAL LIGATION      OB History    Gravida Para Term Preterm AB  Living   2 2 2     2    SAB TAB Ectopic Multiple Live Births                   Home Medications    Prior to Admission medications   Medication Sig Start Date End Date Taking? Authorizing Provider  acetaminophen (TYLENOL) 500 MG tablet Take 1,000 mg by mouth 2 (two) times daily as needed for pain.     [provider]  aspirin EC 81 MG tablet Take 81 mg by mouth daily.    [provider]  buPROPion (WELLBUTRIN SR) 150 MG 12 hr tablet Take 150 mg by mouth 2 (two) times daily.    [provider]  Calcium-Vitamin D (CALTRATE 600 PLUS-VIT D PO) Take 1 tablet by mouth daily before supper.     [provider]  Cholecalciferol (VITAMIN D) 2000 UNITS CAPS Take 2,000 Units by mouth daily before supper.     [provider]  clopidogrel (PLAVIX) 75 MG tablet Take 1 tablet (75 mg total) by mouth daily with breakfast. 10/05/12   Jettie Booze, MD  levothyroxine (SYNTHROID, LEVOTHROID) 25 MCG tablet Take 25 mcg by mouth daily before breakfast.     [provider]  linaclotide Rolan Lipa) 72 MCG capsule Take 1 capsule (72 mcg total) by mouth daily before breakfast. 04/15/16   Levin Erp, PA  losartan-hydrochlorothiazide (HYZAAR) 100-25 MG per tablet Take 1 tablet by mouth daily.    [provider]  lubiprostone (AMITIZA) 24 MCG capsule Samples of this drug were given to the patient. 08/11/16   Pyrtle, Lajuan Lines, MD  Magnesium 250 MG TABS Take 250 mg by mouth daily before supper.    [provider]  mirabegron ER (MYRBETRIQ) 25 MG TB24 tablet Take 25 mg by mouth at bedtime.     [provider]  Multiple Vitamin (MULTIVITAMIN WITH MINERALS) TABS tablet Take 1 tablet by mouth daily before supper. Centrum Silver     [provider]  pantoprazole (PROTONIX) 40 MG tablet Take 40 mg by mouth daily.    [provider]  potassium gluconate 595 MG TABS Take 595 mg by mouth daily before supper.     [provider]  PRESCRIPTION MEDICATION Apply 1 application topically See admin instructions. C-Wart Cream compounded at [employer]: Cimetidine 2%, Deoxy-d-glucose 0.2%, Fluorouracil powder 5%, salicylic acid 15 %.  Apply to bottom of feet twice daily.    [provider]  Probiotic Product (PROBIOTIC PO) Take 1 tablet by mouth daily after lunch.     [provider]  pyridOXINE (VITAMIN B-6) 100 MG tablet Take 100 mg by mouth daily before supper.     [provider]  rOPINIRole (REQUIP) 1 MG tablet Take 1 mg by mouth at bedtime. 08/25/15   [provider]  Simethicone (  EQ GAS RELIEF PO) Take 1 tablet by mouth daily after lunch.     [provider]  simvastatin (ZOCOR) 40 MG tablet Take 40 mg by mouth daily with supper.     [provider]  Tetrahydrozoline HCl (VISINE OP) Place 1 drop into both eyes daily.    [provider]  vitamin B-12 (CYANOCOBALAMIN) 500 MCG tablet Take 500 mcg by mouth daily.    [provider]    Family History Family History  Problem Relation Age of Onset  . Deep vein thrombosis Mother   . Diabetes Mother        Amputation  . Heart disease Mother        Heart Disease before age 103  . Hyperlipidemia Mother   . Hypertension Mother   . Heart attack Mother   . Stroke Mother   . Thyroid disease Mother   . Heart disease Father        Heart Disease before age 5  . Hypertension Father   . Heart attack Father   . Hyperlipidemia Father   . Diabetes Brother   . Heart disease Brother        Heart Disease before age 71  . Hyperlipidemia Brother   . Hypertension Brother   . Heart attack Brother   . COPD Brother   . Colonic polyp Brother   . Colon cancer Neg Hx   . Stomach cancer Neg Hx     Social History Social History  Substance Use Topics  . Smoking status: Light Tobacco Smoker    Packs/day: 0.25    Types: Cigarettes  . Smokeless tobacco: Never Used  . Alcohol use No      Allergies   Codeine; Darvon [propoxyphene hcl]; and Flagyl [metronidazole]   Review of Systems Review of Systems All other systems reviewed and are negative for acute change except as noted in the HPI. Physical Exam Updated Vital Signs BP (!) 190/77 (BP Location: Right Arm)   Pulse 70   Temp 98.1 F (36.7 C) (Oral)   Resp 18   Ht 5\' 2"  (1.575 m)   Wt 160 lb 2 oz (72.6 kg)   SpO2 98%   BMI 29.29 kg/m   Physical Exam  Constitutional: She is oriented to person, place, and time. She appears well-developed and well-nourished. No distress.  HENT:  Head: Normocephalic and atraumatic.  Mouth/Throat: Oropharynx is clear and moist and mucous membranes are normal. No oropharyngeal exudate.  Eyes: Pupils are equal, round, and reactive to light. Conjunctivae and EOM are normal.  Neck: Normal range of motion. Neck supple.  No meningismus.  Cardiovascular: Normal rate, regular rhythm, normal heart sounds and intact distal pulses.   No murmur heard. Pulmonary/Chest: Effort normal and breath sounds normal. No respiratory distress.  Abdominal: Soft. There is tenderness. There is no rebound and no guarding.  Epigastric tenderness. No RUQ tenderness.  Genitourinary:  Genitourinary Comments: Brown stool on rectal exam.  Musculoskeletal: Normal range of motion. She exhibits no edema or tenderness.  Neurological: She is alert and oriented to person, place, and time. No cranial nerve deficit. She exhibits normal muscle tone. Coordination normal.  No ataxia on finger to nose bilaterally. No pronator drift. 5/5 strength throughout. CN 2-12 intact.Equal grip strength. Sensation intact.   Skin: Skin is warm.  Psychiatric: She has a normal mood and affect. Her behavior is normal.  Nursing note and vitals reviewed.  ED Treatments / Results  Labs (all labs ordered are listed, but  only abnormal results are displayed) Labs Reviewed  COMPREHENSIVE METABOLIC PANEL - Abnormal; Notable for the  following:       Result Value   Glucose, Bld 139 (*)    Creatinine, Ser 1.18 (*)    GFR calc non Af Amer 47 (*)    GFR calc Af Amer 54 (*)    All other components within normal limits  CBC - Abnormal; Notable for the following:    RBC 5.72 (*)    Hemoglobin 15.9 (*)    HCT 47.9 (*)    All other components within normal limits  URINALYSIS, ROUTINE W REFLEX MICROSCOPIC - Abnormal; Notable for the following:    APPearance HAZY (*)    Ketones, ur 5 (*)    All other components within normal limits  I-STAT CG4 LACTIC ACID, ED - Abnormal; Notable for the following:    Lactic Acid, Venous 1.91 (*)    All other components within normal limits  I-STAT CG4 LACTIC ACID, ED - Abnormal; Notable for the following:    Lactic Acid, Venous 2.53 (*)    All other components within normal limits  I-STAT CG4 LACTIC ACID, ED - Abnormal; Notable for the following:    Lactic Acid, Venous 2.25 (*)    All other components within normal limits  LIPASE, BLOOD  TROPONIN I  POC OCCULT BLOOD, ED  I-STAT CG4 LACTIC ACID, ED  I-STAT CG4 LACTIC ACID, ED    EKG  EKG Interpretation  Date/Time:  Sunday September 18 2016 01:06:37 EDT Ventricular Rate:  56 PR Interval:    QRS Duration: 91 QT Interval:  447 QTC Calculation: 432 R Axis:   14 Text Interpretation:  Sinus rhythm Borderline T abnormalities, anterior leads Nonspecific T wave abnormality Confirmed by Ezequiel Essex [phone]) on 09/18/2016 2:03:37 AM       Radiology Ct Abdomen Pelvis W Contrast  Result Date: 09/18/2016 CLINICAL DATA:  66 y/o  F; nausea and vomiting all day. EXAM: CT ABDOMEN AND PELVIS WITH CONTRAST TECHNIQUE: Multidetector CT imaging of the abdomen and pelvis was performed using the standard protocol following bolus administration of intravenous contrast. CONTRAST:  126mL ISOVUE-300 IOPAMIDOL (ISOVUE-300) INJECTION 61% COMPARISON:  10/13/2014 CT abdomen and pelvis. FINDINGS: Lower chest: No acute abnormality. Hepatobiliary: No focal  liver abnormality is seen. No gallstones, gallbladder wall thickening, or biliary dilatation. Pancreas: Unremarkable. No pancreatic ductal dilatation or surrounding inflammatory changes. Spleen: Normal in size without focal abnormality. Adrenals/Urinary Tract: Adrenal glands are unremarkable. Kidneys are normal, without renal calculi, focal lesion, or hydronephrosis. Bladder is unremarkable.The Stomach/Bowel: Stomach is within normal limits. Appendix appears normal. No evidence of bowel wall thickening, distention, or inflammatory changes. Mild sigmoid diverticulosis. 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 Left external iliac artery patent stent. Severe calcific atherosclerosis of the abdominal aorta. No lymphadenopathy by imaging criteria. Reproductive: Uterus and bilateral adnexa are unremarkable. Other: No abdominal wall hernia or abnormality. No abdominopelvic ascites. Musculoskeletal: No acute fracture. Mild osteoarthrosis of bilateral hip joints. Discogenic degenerative changes of the spine greatest at lower thoracic spine and at L5-S1 level. IMPRESSION: 1. No acute process identified. 2. Stable mild sigmoid colon diverticulosis without evidence for diverticulitis. 3. 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 Electronically Signed   By: Kristine Garbe M.D.   On: 09/18/2016 02:44    Procedures Procedures (including critical care time)  DIAGNOSTIC STUDIES: Oxygen Saturation is 98% on RA, normal by my  interpretation.    COORDINATION OF CARE: 1:12 AM Discussed treatment plan with pt at bedside and pt agreed to plan.  Medications Ordered in ED Medications - No data to display   Initial Impression / Assessment and Plan / ED Course  I have reviewed the triage vital signs and the nursing notes.  Pertinent labs & imaging results that were available during my care of the patient  were reviewed by me and considered in my medical decision making (see chart for details).     Nausea and dry heaves x 1 day. No diarrhea or fever. Some epigastric pain.  Vitals stable, abdomen benign. IVF and symptom control given.  Labs reassuring. UA negative. Lactate improving with hydration.  CT scan without acute pathology.  Suspect symptoms due to history of Barrett's esophagus and GERD, worsened by recent NSAID use.   She feels improved and is tolerating PO in the ED.  D/w doctor stopping diclofenac. Continue PPI. Return precautions discussed.  Final Clinical Impressions(s) / ED Diagnoses   Final diagnoses:  Non-intractable vomiting with nausea, unspecified vomiting type    New Prescriptions New Prescriptions   No medications on file   I personally performed the services described in this documentation, which was scribed in my presence. The recorded information has been reviewed and is accurate.   Ezequiel Essex, MD 09/19/16 2124

## 2016-09-19 ENCOUNTER — Other Ambulatory Visit: Payer: Self-pay | Admitting: Family Medicine

## 2016-09-19 DIAGNOSIS — Z1231 Encounter for screening mammogram for malignant neoplasm of breast: Secondary | ICD-10-CM

## 2016-09-19 LAB — POC OCCULT BLOOD, ED: Fecal Occult Bld: NEGATIVE

## 2016-09-28 ENCOUNTER — Ambulatory Visit
Admission: RE | Admit: 2016-09-28 | Discharge: 2016-09-28 | Disposition: A | Discharge status: Home / Self Care | Payer: Medicare Other | Source: Ambulatory Visit | Attending: Family Medicine | Admitting: Family Medicine

## 2016-09-28 DIAGNOSIS — Z1231 Encounter for screening mammogram for malignant neoplasm of breast: Secondary | ICD-10-CM

## 2016-10-13 ENCOUNTER — Encounter: Payer: Self-pay | Admitting: Neurology

## 2016-11-02 ENCOUNTER — Encounter: Payer: Self-pay | Admitting: Adult Health

## 2016-11-02 ENCOUNTER — Encounter (INDEPENDENT_AMBULATORY_CARE_PROVIDER_SITE_OTHER): Payer: Self-pay | Admitting: Adult Health

## 2016-11-02 VITALS — BP 110/66 | HR 64 | Wt 160.0 lb

## 2016-11-02 DIAGNOSIS — R413 Other amnesia: Secondary | ICD-10-CM

## 2016-11-02 NOTE — Progress Notes (Signed)
    Patient came in today. She was upset as she felt this was an appointment to discuss new neck pain that she was having. My nurse advised that her primary care actually sent that referral to [facility] neurology and she has an appointment December 14 with them to discuss this issue. I explained to the patient that she was here today for follow-up regarding her memory which is what she was initially seen in our office for. I advised that if she was having new symptoms and appointment would have to be made with the neurologist. She verbalized understanding. She states that she did not want to proceed with this visit and would make an appointment to see Dr. Jaynee Eagles.   Ward Givens, MSN, NP-C 11/02/2016, 2:19 PM [facility] [address] [place], [place] [address] [phone]

## 2016-12-07 ENCOUNTER — Encounter: Payer: Self-pay | Admitting: Neurology

## 2016-12-07 ENCOUNTER — Ambulatory Visit (INDEPENDENT_AMBULATORY_CARE_PROVIDER_SITE_OTHER): Payer: Medicare Other | Admitting: Neurology

## 2016-12-07 VITALS — BP 143/77 | HR 72 | Wt 163.4 lb

## 2016-12-07 DIAGNOSIS — M542 Cervicalgia: Secondary | ICD-10-CM | POA: Diagnosis not present

## 2016-12-07 DIAGNOSIS — M5440 Lumbago with sciatica, unspecified side: Secondary | ICD-10-CM

## 2016-12-07 NOTE — Progress Notes (Addendum)
[facility]    Provider:  Dr Jaynee Eagles Referring Provider: Shirline Frees, MD Primary Care Physician:  Shirline Frees, MD  CC:  Pain all over  HPI:  Vicki Page is a 66 y.o. female here as a referral from Dr. Kenton Kingfisher for a new problem. Patient seen in the past for dizziness and memory loss which was likely due to stress, fatigue, MMSE was 30/30 and was recommended to vestibular therapy for her dizziness. Here today for new problem. She is here for a new problem. She has pain in the upper back. She has numbness in the arms, in the hands and arms and also in the legs. He has problems sitting for a long time, sleeping, she has pain in the calfs and the back of the legs, toes. She is still smoking. She has neck pain. She has shooting pains into her arms and they go numb. And numbness int he fingers.   Reviewed notes, labs and imaging from outside physicians, which showed:   MRI of the cervical spine:  1. Mild marrow edema compatible with acute exacerbation of chronic facet joint arthritis on the left at C2-C3. Burtis Junes this as the source of posterior neck pain. Superimposed mild degenerative marrow edema also at the C2-C3 endplates. 2. No other acute osseous abnormality. 3. Diffuse chronic cervical disc and endplate degeneration. No subsequent spinal stenosis, but widespread mild left side cervical neural foraminal stenosis.  MRI lumbar spine:  IMPRESSION: No spinal stenosis or neural compression.  Disc degeneration and facet degeneration at L3-4, L4-5 and L5-S1 which could be associated with back pain. Facet arthropathy is most pronounced at the L5-S1 level. Discogenic marrow changes at the L5-S1 endplates could be associated with back pain. Some discogenic change also present at the T10-11 level.  MRI brain:  Chronic small-vessel ischemic changes of the brain, similar to the previous study. No acute brain finding.  No evidence of residual or recurrent flow in the  treated left aneurysm.  No change in a right carotid siphon stenosis, estimated at 70%.  Signal loss in the left carotid siphon related to susceptibility artifact. No stenosis seen in that region and recent angiography.  No change in a 60-70% stenosis at the left vertebral artery proximal to PICA.  HPI:  Vicki Page is a 66 y.o. female here as a referral from Dr. Estanislado Pandy for memory loss. PMHx . She started having memory loss for 4-5 months. Nothing happened 4-5 months ago. 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 She is forgetting her appointments, she forgot 2 appointments the same week and she had to reschedule. She has forgotten other things as well. She is getting ready to go to the store and she is in the car going to the store and by the time she gets into the store she has forgotten. She has to write things down. No dementia in the family. She was taking care of her brother he had multipl ehealth issues, she cared for him full time for 2-3 years the last year he went downhill and it was difficult for her. Since he has died she is more stressed, she has a 66 year old homeless daughter who needs mental health.  She is doing poorly and this is a lot of stress. She didnlt have a chance to grieve her brother. She doesn't sleep well. She is fatigued. She is not exercising.  She lives independently, paying all the bills, not getting lost, she has  cataracts and having difficulty driving at night. Not waking with headaches, not snoring, not sleeping.  Dizziness is on waking and if she turns quickly or gets up too fast. No other focal neurologic deficits, no inciting events or head trauma, no other associated symptoms or modifiable factors.  Reviewed notes, labs and imaging from outside physicians, which showed:  Personally reviewed images of the brain October 2017 and agree with following: 1. Moderate chronic small vessel ischemic disease, mildly progressed from  2013. 2. Moderate left V4 vertebral artery stenosis and moderate to severe proximal right cavernous ICA stenosis, likely unchanged from the 06/2015 angiogram. 3. 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  Review of Systems: Patient complains of symptoms per HPI as well as the following symptoms: weight gain, blurred vision, double vision, memory loss, headache, dizziness, sleepinessm restless, depressin, decreased energy, change in appetite, aching muscles, constipation. Pertinent negatives per HPI. All others negative.   Review of Systems: Patient complains of symptoms per HPI as well as the following symptoms: . Pertinent negatives and positives per HPI. All others negative.   Social History   Social History  . Marital status: Single    Spouse name: N/A  . Number of children: 2  . Years of education: 16   Occupational History  . Retired    Social History Main Topics  . Smoking status: Light Tobacco Smoker    Packs/day: 0.25    Types: Cigarettes  . Smokeless tobacco: Never Used  . Alcohol use No  . Drug use: No  . Sexual activity: No   Other Topics Concern  . Not on file   Social History Narrative   Lives alone   Caffeine use: none   Right handed    Family History  Problem Relation Age of Onset  . Deep vein thrombosis Mother   . Diabetes Mother        Amputation  . Heart disease Mother        Heart Disease before age 64  . Hyperlipidemia Mother   . Hypertension Mother   . Heart attack Mother   . Stroke Mother   . Thyroid disease Mother   . Heart disease Father        Heart Disease before age 49  . Hypertension Father   . Heart attack Father   . Hyperlipidemia Father   . Heart disease Brother        Heart Disease before age 28  . Hyperlipidemia Brother   . Hypertension Brother   . Heart attack Brother   . COPD Brother   . Diabetes Brother   . Colonic polyp Brother   . Colon  cancer Neg Hx   . Stomach cancer Neg Hx     Past Medical History:  Diagnosis Date  . Adenomatous colon polyp   . 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 Anxiety   . Arthritis   . Atherosclerosis   . Barrett's esophagus   . Carotid artery occlusion   . DDD (degenerative disc disease), lumbar   . Diverticulosis   . DVT (deep venous thrombosis) ([place])   . Fall at home May 30, 2014   Pt slipped in tub- hurt left hip and right shoulder  . GERD (gastroesophageal reflux disease)   . H. pylori infection   . Hyperlipidemia   . Hypertension   . Hyperthyroidism   . IBS (irritable bowel syndrome)   . Osteoarthritis   .  RLS (restless legs syndrome)   . Tubular adenoma of colon   . Ulcer of the stomach and intestine     Past Surgical History:  Procedure Laterality Date  . ANAL RECTAL MANOMETRY N/A 05/04/2016   Procedure: ANO RECTAL MANOMETRY;  Surgeon: Mauri Pole, MD;  Location: [facility] ENDOSCOPY;  Service: Endoscopy;  Laterality: N/A;  . ANEURYSM COILING  July 26, 2010   stent  . BRAIN SURGERY     Per patient " Left side behind eyes  . INSERTION OF ILIAC STENT  10/04/2012   Procedure: INSERTION OF ILIAC STENT;  Surgeon: Jettie Booze, MD;  Location: [facility] CATH LAB;  Service: Cardiovascular;;  Left External Iliac Artery  . IR GENERIC HISTORICAL  11/03/2015   IR ANGIO VERTEBRAL SEL VERTEBRAL UNI L MOD SED 11/03/2015 Luanne Bras, MD [facility]INTER[facility]  . IR GENERIC HISTORICAL  11/03/2015   IR ANGIO INTRA EXTRACRAN SEL COM CAROTID INNOMINATE BILAT MOD SED 11/03/2015 Luanne Bras, MD [facility]  . IR GENERIC HISTORICAL  11/03/2015   IR ANGIO VERTEBRAL SEL SUBCLAVIAN INNOMINATE UNI R MOD SED 11/03/2015 Luanne Bras, MD [facility]  . IR GENERIC HISTORICAL  11/19/2015   IR RADIOLOGIST EVAL & MGMT 11/19/2015 [facility]  . LEG SURGERY    . LOWER EXTREMITY ANGIOGRAM Left 10-04-12   and Left iliac stent  . LOWER EXTREMITY ANGIOGRAM N/A 10/04/2012   Procedure: LOWER EXTREMITY  ANGIOGRAM;  Surgeon: Jettie Booze, MD;  [facility] LAB;  Service: Cardiovascular;  Laterality: N/A;  . TUBAL LIGATION      Current Outpatient Prescriptions  Medication Sig Dispense Refill  . acetaminophen (TYLENOL) 500 MG tablet Take 1,000 mg by mouth 2 (two) times daily as needed for pain.     Marland Kitchen aspirin EC 81 MG tablet Take 81 mg by mouth daily.    . baclofen (LIORESAL) 20 MG tablet Take 20 mg by mouth 3 (three) times daily.    Marland Kitchen buPROPion (WELLBUTRIN SR) 150 MG 12 hr tablet Take 150 mg by mouth 2 (two) times daily.    . Calcium-Vitamin D (CALTRATE 600 PLUS-VIT D PO) Take 1 tablet by mouth daily before supper.     . Cholecalciferol (VITAMIN D) 2000 UNITS CAPS Take 2,000 Units by mouth daily before supper.     . clopidogrel (PLAVIX) 75 MG tablet Take 1 tablet (75 mg total) by mouth daily with breakfast. 30 tablet 2  . levothyroxine (SYNTHROID, LEVOTHROID) 25 MCG tablet Take 25 mcg by mouth daily before breakfast.     . linaclotide (LINZESS) 72 MCG capsule Take 1 capsule (72 mcg total) by mouth daily before breakfast. 90 capsule 0  . losartan-hydrochlorothiazide (HYZAAR) 100-25 MG per tablet Take 1 tablet by mouth daily.    Marland Kitchen lubiprostone (AMITIZA) 24 MCG capsule Samples of this drug were given to the patient. 8 capsule 0  . Magnesium 250 MG TABS Take 250 mg by mouth daily before supper.    . mirabegron ER (MYRBETRIQ) 25 MG TB24 tablet Take 25 mg by mouth at bedtime.     . Multiple Vitamin (MULTIVITAMIN WITH MINERALS) TABS tablet Take 1 tablet by mouth daily before supper. Centrum Silver     . pantoprazole (PROTONIX) 40 MG tablet Take 40 mg by mouth daily.    . potassium gluconate 595 MG TABS Take 595 mg by mouth daily before supper.     . Probiotic Product (PROBIOTIC PO) Take 1 tablet by mouth daily after lunch.     Marland Kitchen  pyridOXINE (VITAMIN B-6) 100 MG tablet Take 100 mg by mouth daily before supper.     Marland Kitchen rOPINIRole (REQUIP) 1 MG tablet Take 1 mg by mouth at bedtime.  0  .  simvastatin (ZOCOR) 40 MG tablet Take 40 mg by mouth daily with supper.     . vitamin B-12 (CYANOCOBALAMIN) 500 MCG tablet Take 500 mcg by mouth daily.     No current facility-administered medications for this visit.     Allergies as of 12/07/2016 - Review Complete 12/07/2016  Allergen Reaction Noted  . Codeine Nausea And Vomiting 01/03/2014  . Darvon [propoxyphene hcl] Nausea And Vomiting 08/28/2011  . Flagyl [metronidazole] Nausea And Vomiting 08/28/2011    Vitals: BP (!) 143/77   Pulse 72   Wt 163 lb 6.4 oz (74.1 kg)   BMI 29.89 kg/m  Last Weight:  Wt Readings from Last 1 Encounters:  12/07/16 163 lb 6.4 oz (74.1 kg)   Last Height:   Ht Readings from Last 1 Encounters:  09/17/16 5\' 2"  (1.575 m)    Physical exam: Exam: Gen: NAD, conversant, well nourised, obese, well groomed                     Eyes: Conjunctivae clear without exudates or hemorrhage  Neuro: Detailed Neurologic Exam  Speech:    Speech is normal; fluent and spontaneous with normal comprehension.  Cognition:    The patient is oriented to person, place, and time;     Cranial Nerves:    The pupils are equal, round, and reactive to light. Visual fields are full to finger confrontation. Extraocular movements are intact. Trigeminal sensation is intact and the muscles of mastication are normal. The face is symmetric. The palate elevates in the midline. Hearing intact. Voice is normal. Shoulder shrug is normal. The tongue has normal motion without fasciculations.   Coordination:    Normal finger to nose and heel to shin. Normal rapid alternating movements.   Motor Observation:    No asymmetry, no atrophy, and no involuntary movements noted. Tone:    Normal muscle tone.    Posture:    Posture is normal. normal erect    Strength:    Strength is V/V in the upper and lower limbs.      Sensation: intact to LT     Reflex Exam:    Assessment/Plan:  66 y.o. female here as a referral from Dr. Kenton Kingfisher for a  new problem. Patient seen in the past for dizziness and memory loss which was likely due to stress, fatigue, MMSE was 30/30 and was recommended to vestibular therapy for her dizziness. Here today for new problem. She is here for a new problem. She has pain in the upper back. She has numbness in the arms, in the hands and arms and also in the legs with neck and back pain.  - Had diffuse cervical and lumbar arthritic changes will refer to Dr. Maryjean Ka for pain procedures such as epidural steroids or facet injections or other pain procedure - EMG/NCS to evaluate for CTS, polyneuropathy or other causes of her paresthesias in the hands and feet. Both arms and one lower extremity - Follow up with pcp for vascular evaluation  Orders Placed This Encounter  Procedures  . Ambulatory referral to Neurosurgery  . NCV with EMG(electromyography)    Sarina Ill, MD  [facility] [address] [place], [place] [address]  Phone [phone] Fax [phone]  A total of 25 minutes was spent  face-to-face with this patient. Over half this time was spent on counseling patient on the chronic low back and neck pain diagnosis and different diagnostic and therapeutic options available.

## 2016-12-07 NOTE — Patient Instructions (Signed)
Recommend EMG/NCS Will refer to Dr. Maryjean Ka at [facility] for evaluation of Epidural Steroid Injections thanks Follow up with your primary care to discuss circulation concerns

## 2016-12-07 NOTE — Addendum Note (Signed)
Addended by: Sarina Ill B on: 12/07/2016 10:01 AM   Modules accepted: Orders

## 2016-12-12 ENCOUNTER — Telehealth (HOSPITAL_COMMUNITY): Payer: Self-pay

## 2016-12-12 NOTE — Telephone Encounter (Signed)
Called to schedule 6 month f/u mri, left message for pt to return call. AW 

## 2016-12-13 ENCOUNTER — Telehealth (HOSPITAL_COMMUNITY): Payer: Self-pay

## 2016-12-13 NOTE — Telephone Encounter (Signed)
Returned pt's phone call to schedule f/u. AW

## 2016-12-14 ENCOUNTER — Other Ambulatory Visit (HOSPITAL_COMMUNITY): Payer: Self-pay | Admitting: Interventional Radiology

## 2016-12-14 DIAGNOSIS — I729 Aneurysm of unspecified site: Secondary | ICD-10-CM

## 2017-01-02 ENCOUNTER — Ambulatory Visit (HOSPITAL_COMMUNITY): Payer: Medicare Other

## 2017-01-02 ENCOUNTER — Ambulatory Visit (HOSPITAL_COMMUNITY)
Admission: RE | Admit: 2017-01-02 | Discharge: 2017-01-02 | Disposition: A | Discharge status: Home / Self Care | Payer: Medicare Other | Source: Ambulatory Visit | Attending: Interventional Radiology | Admitting: Interventional Radiology

## 2017-01-02 DIAGNOSIS — I729 Aneurysm of unspecified site: Secondary | ICD-10-CM | POA: Insufficient documentation

## 2017-01-02 DIAGNOSIS — Z95828 Presence of other vascular implants and grafts: Secondary | ICD-10-CM | POA: Insufficient documentation

## 2017-01-02 DIAGNOSIS — I6782 Cerebral ischemia: Secondary | ICD-10-CM | POA: Diagnosis not present

## 2017-01-02 LAB — CREATININE, SERUM
Creatinine, Ser: 1.11 mg/dL — ABNORMAL HIGH (ref 0.44–1.00)
GFR calc Af Amer: 59 mL/min — ABNORMAL LOW (ref >60)
GFR calc non Af Amer: 51 mL/min — ABNORMAL LOW (ref >60)

## 2017-01-02 MED ORDER — GADOBENATE DIMEGLUMINE 529 MG/ML IV SOLN
15.0000 mL | Freq: Once | INTRAVENOUS | Status: AC
  Administered 2017-01-02: 15 mL via INTRAVENOUS

## 2017-01-05 ENCOUNTER — Encounter: Payer: Medicare Other | Admitting: Neurology

## 2017-01-10 ENCOUNTER — Other Ambulatory Visit: Payer: Self-pay

## 2017-01-10 DIAGNOSIS — I739 Peripheral vascular disease, unspecified: Secondary | ICD-10-CM

## 2017-01-11 ENCOUNTER — Telehealth (HOSPITAL_COMMUNITY): Payer: Self-pay

## 2017-01-11 ENCOUNTER — Telehealth (HOSPITAL_COMMUNITY): Payer: Self-pay | Admitting: *Deleted

## 2017-01-11 NOTE — Telephone Encounter (Signed)
Called and spoke with patient regarding symptoms, if she has any and also to confirm she is taking her medication.  She is not having headaches, however a couple times a week she has dizziness if her head is down and she moves or turns her head.  Will speak with Dr. Estanislado Pandy about this.  Pt confirmed that she is taking Plavix 75mg  daily and ASA 81mg  daily

## 2017-01-12 ENCOUNTER — Ambulatory Visit: Payer: Medicare Other | Admitting: Neurology

## 2017-01-12 ENCOUNTER — Ambulatory Visit (INDEPENDENT_AMBULATORY_CARE_PROVIDER_SITE_OTHER): Payer: Medicare Other | Admitting: Neurology

## 2017-01-12 DIAGNOSIS — R202 Paresthesia of skin: Secondary | ICD-10-CM | POA: Diagnosis not present

## 2017-01-12 DIAGNOSIS — M5136 Other intervertebral disc degeneration, lumbar region: Secondary | ICD-10-CM

## 2017-01-12 DIAGNOSIS — M5440 Lumbago with sciatica, unspecified side: Secondary | ICD-10-CM

## 2017-01-12 DIAGNOSIS — R200 Anesthesia of skin: Secondary | ICD-10-CM

## 2017-01-12 DIAGNOSIS — G5603 Carpal tunnel syndrome, bilateral upper limbs: Secondary | ICD-10-CM

## 2017-01-12 DIAGNOSIS — Z0289 Encounter for other administrative examinations: Secondary | ICD-10-CM

## 2017-01-12 DIAGNOSIS — M542 Cervicalgia: Secondary | ICD-10-CM

## 2017-01-12 NOTE — Progress Notes (Signed)
Full Name: Vicki Page Gender: Female MRN #: [id number] Date of Birth: 01-05-2051    Visit Date: 01/12/17 10:39 Age: 66 Years 66 Months Old Examining Physician: Sarina Ill, MD  Referring Physician: Shirline Frees, MD  Summary: Patient with neck pain, bilateral hand numbness. Also with low back pain and numbness in the legs radiating from the buttocks to the feet.  Summary. Summary:   Nerve Conduction Studies were performed on the bilateral upper extremities and right lower extremity.  The right median APB motor nerve showed prolonged distal onset latency (4.6 ms, N<4.4). The left median APB motor nerve showed prolonged distal onset latency (4.9 ms, N<4.4). The right Median 2nd Digit orthodromic sensory nerve showed prolonged distal peak latency (4.8 ms, N<3.4) and reduced amplitude(3uV, N>10) The left Median 2nd Digit orthodromic sensory nerve showed prolonged distal peak latency (4.7 ms, N<3.4) and reduced amplitude(6uV, N>10) .The right median/ulnar (palm) comparison nerve showed prolonged distal peak latency (Median Palm, 3.5 ms, N<2.2) and abnormal peak latency difference (Median Palm-Ulnar Palm, 1.4 ms, N<0.4) with a relative median delay.  The left median/ulnar (palm) comparison nerve showed prolonged distal peak latency (Median Palm, 3.5 ms, N<2.2) and abnormal peak latency difference (Median Palm-Ulnar Palm, 1.4 ms, N<0.4) with a relative median delay. NCS studies of the right leg were normal All muscles in th following tables were normal. Patient declined right leg EMG.    Conclusion: There is moderately-severe bilateral Carpal Tunnel Syndrome. No suggestion of cervical radiculopathy. Nerve Conduction studies of the right leg were normal, no evidence of neuropathy or lumbar radiculopathy on NCS. Patient declined right leg EMG.   Sarina Ill, M.D.  Cc: Dr[facility] [place], [place] [address] Tel: [phone] Fax:  [phone]        [facility]    Nerve / Sites Muscle Latency Ref. Amplitude Ref. Rel Amp Segments Distance Velocity Ref. Area    ms ms mV mV %  cm m/s m/s mVms  R Median - APB     Wrist APB 4.6 ?4.4 9.6 ?4.0 100 Wrist - APB 7   41.2     Upper arm APB 9.0  10.0  103 Upper arm - Wrist 23 53 ?49 43.2  L Median - APB     Wrist APB 4.9 ?4.4 5.7 ?4.0 100 Wrist - APB 7   23.7     Upper arm APB 8.8  5.6  98.1 Upper arm - Wrist 22 57 ?49 22.3  R Ulnar - ADM     Wrist ADM 3.3 ?3.3 7.2 ?6.0 100 Wrist - ADM 7   28.6     B.Elbow ADM 7.2  7.1  97.8 B.Elbow - Wrist 20 51 ?49 29.0     A.Elbow ADM 9.2  6.6  93.8 A.Elbow - B.Elbow 10 51 ?49 28.6         A.Elbow - Wrist      L Ulnar - ADM     Wrist ADM 3.2 ?3.3 8.9 ?6.0 100 Wrist - ADM 7   32.1     B.Elbow ADM 7.1  8.2  92.6 B.Elbow - Wrist 20 51 ?49 31.0     A.Elbow ADM 9.0  8.0  97.7 A.Elbow - B.Elbow 10 53 ?49 32.4         A.Elbow - Wrist      R Peroneal - EDB     Ankle EDB 4.2 ?6.5 5.0 ?2.0 100 Ankle - EDB 9  15.5     Fib head EDB 10.6  4.3  85.1 Fib head - Ankle 32 50 ?44 13.9     Pop fossa EDB 12.6  4.5  104 Pop fossa - Fib head 10 51 ?44 14.4         Pop fossa - Ankle      R Tibial - AH     Ankle AH 4.1 ?5.8 4.5 ?4.0 100 Ankle - AH 9   8.6     Pop fossa AH 13.0  4.2  92 Pop fossa - Ankle 37 42 ?41 9.4                 SNC    Nerve / Sites Rec. Site Peak Lat Ref.  Amp Ref. Segments Distance Peak Diff Ref.    ms ms V V  cm ms ms  R Sural - Ankle (Calf)     Calf Ankle 3.1 ?4.4 7 ?6 Calf - Ankle 14    R Superficial peroneal - Ankle     Lat leg Ankle 3.4 ?4.4 7 ?6 Lat leg - Ankle 14    R Median, Ulnar - Transcarpal comparison     Median Palm Wrist 3.5 ?2.2 12 ?35 Median Palm - Wrist 8       Ulnar Palm Wrist 2.1 ?2.2 9 ?12 Ulnar Palm - Wrist 8          Median Palm - Ulnar Palm  1.4 ?0.4  L Median, Ulnar - Transcarpal comparison     Median Palm Wrist 3.5 ?2.2 16 ?35 Median Palm - Wrist 8       Ulnar Palm Wrist 2.1 ?2.2 8 ?12 Ulnar Palm -  Wrist 8          Median Palm - Ulnar Palm  1.4 ?0.4  R Median - Orthodromic (Dig II, Mid palm)     Dig II Wrist 4.8 ?3.4 3 ?10 Dig II - Wrist 13    L Median - Orthodromic (Dig II, Mid palm)     Dig II Wrist 4.7 ?3.4 6 ?10 Dig II - Wrist 13    R Ulnar - Orthodromic, (Dig V, Mid palm)     Dig V Wrist 3.1 ?3.1 8 ?5 Dig V - Wrist 11    L Ulnar - Orthodromic, (Dig V, Mid palm)     Dig V Wrist 3.0 ?3.1 5 ?5 Dig V - Wrist 53                       F  Wave    Nerve F Lat Ref.   ms ms  R Tibial - AH 50.3 ?56.0  R Ulnar - ADM 28.9 ?32.0  L Ulnar - ADM 28.6 ?32.0           EMG full       EMG Summary Table    Spontaneous MUAP Recruitment  Muscle IA Fib PSW Fasc Other Amp Dur. Poly Pattern  L. Deltoid Normal None None None _______ Normal Normal Normal Normal  R. Deltoid Normal None None None _______ Normal Normal Normal Normal  L. Triceps brachii Normal None None None _______ Normal Normal Normal Normal  R. Triceps brachii Normal None None None _______ Normal Normal Normal Normal  L. Pronator teres Normal None None None _______ Normal Normal Normal Normal  R. Pronator teres Normal None None None _______ Normal Normal Normal Normal  L. First dorsal interosseous Normal None None None _______ Normal Normal  Normal Normal  R. First dorsal interosseous Normal None None None _______ Normal Normal Normal Normal  L. Opponens pollicis Normal None None None _______ Normal Normal Normal Normal  R. Opponens pollicis Normal None None None _______ Normal Normal Normal Normal  L. Cervical paraspinals (low) Normal None None None _______ Normal Normal Normal Normal  R. Cervical paraspinals (low) Normal None None None _______ Normal Normal Normal Normal

## 2017-01-12 NOTE — Progress Notes (Signed)
See procedure note.

## 2017-01-13 NOTE — Procedures (Signed)
Full Name: Vicki Page Gender: Female MRN #: [id number] Date of Birth: 2050-11-21    Visit Date: 01/12/17 10:39 Age: 66 Years 19 Months Old Examining Physician: Sarina Ill, MD  Referring Physician: Shirline Frees, MD  Summary: Patient with neck pain, bilateral hand numbness. Also with low back pain and numbness in the legs radiating from the buttocks to the feet.  Summary. Summary:   Nerve Conduction Studies were performed on the bilateral upper extremities and right lower extremity.  The right median APB motor nerve showed prolonged distal onset latency (4.6 ms, N<4.4). The left median APB motor nerve showed prolonged distal onset latency (4.9 ms, N<4.4). The right Median 2nd Digit orthodromic sensory nerve showed prolonged distal peak latency (4.8 ms, N<3.4) and reduced amplitude(3uV, N>10) The left Median 2nd Digit orthodromic sensory nerve showed prolonged distal peak latency (4.7 ms, N<3.4) and reduced amplitude(6uV, N>10) .The right median/ulnar (palm) comparison nerve showed prolonged distal peak latency (Median Palm, 3.5 ms, N<2.2) and abnormal peak latency difference (Median Palm-Ulnar Palm, 1.4 ms, N<0.4) with a relative median delay.  The left median/ulnar (palm) comparison nerve showed prolonged distal peak latency (Median Palm, 3.5 ms, N<2.2) and abnormal peak latency difference (Median Palm-Ulnar Palm, 1.4 ms, N<0.4) with a relative median delay. NCS studies of the right leg were normal All muscles in th following tables were normal. Patient declined right leg EMG.    Conclusion: There is moderately-severe bilateral Carpal Tunnel Syndrome. No suggestion of cervical radiculopathy. Nerve Conduction studies of the right leg were normal, no evidence of neuropathy or lumbar radiculopathy on NCS. Patient declined right leg EMG.   Sarina Ill, M.D.  Cc: Dr. [facility] [place], [place] [address] Tel: [phone] Fax:  [phone]        [facility]    Nerve / Sites Muscle Latency Ref. Amplitude Ref. Rel Amp Segments Distance Velocity Ref. Area    ms ms mV mV %  cm m/s m/s mVms  R Median - APB     Wrist APB 4.6 ?4.4 9.6 ?4.0 100 Wrist - APB 7   41.2     Upper arm APB 9.0  10.0  103 Upper arm - Wrist 23 53 ?49 43.2  L Median - APB     Wrist APB 4.9 ?4.4 5.7 ?4.0 100 Wrist - APB 7   23.7     Upper arm APB 8.8  5.6  98.1 Upper arm - Wrist 22 57 ?49 22.3  R Ulnar - ADM     Wrist ADM 3.3 ?3.3 7.2 ?6.0 100 Wrist - ADM 7   28.6     B.Elbow ADM 7.2  7.1  97.8 B.Elbow - Wrist 20 51 ?49 29.0     A.Elbow ADM 9.2  6.6  93.8 A.Elbow - B.Elbow 10 51 ?49 28.6         A.Elbow - Wrist      L Ulnar - ADM     Wrist ADM 3.2 ?3.3 8.9 ?6.0 100 Wrist - ADM 7   32.1     B.Elbow ADM 7.1  8.2  92.6 B.Elbow - Wrist 20 51 ?49 31.0     A.Elbow ADM 9.0  8.0  97.7 A.Elbow - B.Elbow 10 53 ?49 32.4         A.Elbow - Wrist      R Peroneal - EDB     Ankle EDB 4.2 ?6.5 5.0 ?2.0 100 Ankle - EDB 9  15.5     Fib head EDB 10.6  4.3  85.1 Fib head - Ankle 32 50 ?44 13.9     Pop fossa EDB 12.6  4.5  104 Pop fossa - Fib head 10 51 ?44 14.4         Pop fossa - Ankle      R Tibial - AH     Ankle AH 4.1 ?5.8 4.5 ?4.0 100 Ankle - AH 9   8.6     Pop fossa AH 13.0  4.2  92 Pop fossa - Ankle 37 42 ?41 9.4                 SNC    Nerve / Sites Rec. Site Peak Lat Ref.  Amp Ref. Segments Distance Peak Diff Ref.    ms ms V V  cm ms ms  R Sural - Ankle (Calf)     Calf Ankle 3.1 ?4.4 7 ?6 Calf - Ankle 14    R Superficial peroneal - Ankle     Lat leg Ankle 3.4 ?4.4 7 ?6 Lat leg - Ankle 14    R Median, Ulnar - Transcarpal comparison     Median Palm Wrist 3.5 ?2.2 12 ?35 Median Palm - Wrist 8       Ulnar Palm Wrist 2.1 ?2.2 9 ?12 Ulnar Palm - Wrist 8          Median Palm - Ulnar Palm  1.4 ?0.4  L Median, Ulnar - Transcarpal comparison     Median Palm Wrist 3.5 ?2.2 16 ?35 Median Palm - Wrist 8       Ulnar Palm Wrist 2.1 ?2.2 8 ?12 Ulnar Palm -  Wrist 8          Median Palm - Ulnar Palm  1.4 ?0.4  R Median - Orthodromic (Dig II, Mid palm)     Dig II Wrist 4.8 ?3.4 3 ?10 Dig II - Wrist 13    L Median - Orthodromic (Dig II, Mid palm)     Dig II Wrist 4.7 ?3.4 6 ?10 Dig II - Wrist 13    R Ulnar - Orthodromic, (Dig V, Mid palm)     Dig V Wrist 3.1 ?3.1 8 ?5 Dig V - Wrist 11    L Ulnar - Orthodromic, (Dig V, Mid palm)     Dig V Wrist 3.0 ?3.1 5 ?5 Dig V - Wrist 32                       F  Wave    Nerve F Lat Ref.   ms ms  R Tibial - AH 50.3 ?56.0  R Ulnar - ADM 28.9 ?32.0  L Ulnar - ADM 28.6 ?32.0           EMG full       EMG Summary Table    Spontaneous MUAP Recruitment  Muscle IA Fib PSW Fasc Other Amp Dur. Poly Pattern  L. Deltoid Normal None None None _______ Normal Normal Normal Normal  R. Deltoid Normal None None None _______ Normal Normal Normal Normal  L. Triceps brachii Normal None None None _______ Normal Normal Normal Normal  R. Triceps brachii Normal None None None _______ Normal Normal Normal Normal  L. Pronator teres Normal None None None _______ Normal Normal Normal Normal  R. Pronator teres Normal None None None _______ Normal Normal Normal Normal  L. First dorsal interosseous Normal None None None _______ Normal Normal  Normal Normal  R. First dorsal interosseous Normal None None None _______ Normal Normal Normal Normal  L. Opponens pollicis Normal None None None _______ Normal Normal Normal Normal  R. Opponens pollicis Normal None None None _______ Normal Normal Normal Normal  L. Cervical paraspinals (low) Normal None None None _______ Normal Normal Normal Normal  R. Cervical paraspinals (low) Normal None None None _______ Normal Normal Normal Normal

## 2017-01-20 ENCOUNTER — Ambulatory Visit: Payer: Medicare Other | Admitting: Neurology

## 2017-02-27 ENCOUNTER — Ambulatory Visit: Payer: Medicare HMO | Admitting: Surgery

## 2017-02-27 ENCOUNTER — Encounter: Payer: Self-pay | Admitting: Surgery

## 2017-02-27 ENCOUNTER — Ambulatory Visit (HOSPITAL_COMMUNITY)
Admission: RE | Admit: 2017-02-27 | Discharge: 2017-02-27 | Disposition: A | Discharge status: Home / Self Care | Payer: Medicare HMO | Source: Ambulatory Visit | Attending: Surgery | Admitting: Surgery

## 2017-02-27 VITALS — BP 165/81 | HR 77 | Temp 98.1°F | Resp 16 | Ht 62.0 in | Wt 164.0 lb

## 2017-02-27 DIAGNOSIS — I10 Essential (primary) hypertension: Secondary | ICD-10-CM | POA: Diagnosis not present

## 2017-02-27 DIAGNOSIS — F17200 Nicotine dependence, unspecified, uncomplicated: Secondary | ICD-10-CM | POA: Diagnosis not present

## 2017-02-27 DIAGNOSIS — I70213 Atherosclerosis of native arteries of extremities with intermittent claudication, bilateral legs: Secondary | ICD-10-CM | POA: Diagnosis not present

## 2017-02-27 DIAGNOSIS — I6523 Occlusion and stenosis of bilateral carotid arteries: Secondary | ICD-10-CM | POA: Diagnosis not present

## 2017-02-27 DIAGNOSIS — I739 Peripheral vascular disease, unspecified: Secondary | ICD-10-CM | POA: Diagnosis not present

## 2017-02-27 NOTE — Progress Notes (Signed)
[facility] [place]  Patient name: Vicki Page MRN: [id number] DOB: October 02, 1950 Sex: female   REQUESTING PROVIDER:    Dr. Kenton Kingfisher   REASON FOR CONSULT:    PAD  HISTORY OF PRESENT ILLNESS:   Vicki Page is a 67 y.o. female, who is known to me, having been evaluated for carotid stenosis many years ago.  She was most recently seen in 2017 where a carotid duplex showed less than 40% stenosis bilaterally.  Patient has a history of intracranial aneurysms.  The left side has been treated with embolization and stent placement in 2012.  She is being monitored for the right side.  The patient has also undergone left leg stenting by Dr. Irish Lack.  She is here today for evaluation of bilateral leg pain which she describes as bothering her when she stands up and tries to start walking.  She states that it is more in her right leg particularly in the knee and behind the knee.  She also complains of numbness in both of her arms.  She has been evaluated by neurology.  A recent EMG study shows carpal tunnel with out evidence of cervical radiculopathy.  The patient also tells me that she has been evaluated by orthopedics, who had recommended physical therapy which did not improve her symptoms.  The patient is medically managed for hypertension.  She takes a statin for hypercholesterolemia.  She is on dual antiplatelet therapy.  PAST MEDICAL HISTORY    Past Medical History:  Diagnosis Date  . Adenomatous colon polyp   . 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 Anxiety   . Arthritis   . Atherosclerosis   . Barrett's esophagus   . Carotid artery occlusion   . DDD (degenerative disc disease), lumbar   . Diverticulosis   . DVT (deep venous thrombosis) ([place])   . Fall at home May 30, 2014   Pt slipped in tub- hurt left hip and right shoulder  . GERD (gastroesophageal reflux disease)   . H. pylori infection   . Hyperlipidemia   . Hypertension     . Hyperthyroidism   . IBS (irritable bowel syndrome)   . Osteoarthritis   . RLS (restless legs syndrome)   . Tubular adenoma of colon   . Ulcer of the stomach and intestine      FAMILY HISTORY   Family History  Problem Relation Age of Onset  . Deep vein thrombosis Mother   . Diabetes Mother        Amputation  . Heart disease Mother        Heart Disease before age 85  . Hyperlipidemia Mother   . Hypertension Mother   . Heart attack Mother   . Stroke Mother   . Thyroid disease Mother   . Heart disease Father        Heart Disease before age 59  . Hypertension Father   . Heart attack Father   . Hyperlipidemia Father   . Heart disease Brother        Heart Disease before age 27  . Hyperlipidemia Brother   . Hypertension Brother   . Heart attack Brother   . COPD Brother   . Diabetes Brother   . Colonic polyp Brother   . Colon cancer Neg Hx   . Stomach cancer Neg Hx     SOCIAL HISTORY:   Social History   Socioeconomic History  . Marital status: Single    Spouse  name: Not on file  . Number of children: 2  . Years of education: 69  . Highest education level: Not on file  Social Needs  . Financial resource strain: Not on file  . Food insecurity - worry: Not on file  . Food insecurity - inability: Not on file  . Transportation needs - medical: Not on file  . Transportation needs - non-medical: Not on file  Occupational History  . Occupation: Retired  Tobacco Use  . Smoking status: Light Tobacco Smoker    Packs/day: 0.25    Types: Cigarettes  . Smokeless tobacco: Never Used  . Tobacco comment: 7 or more depending on the day  Substance and Sexual Activity  . Alcohol use: No    Alcohol/week: 0.0 oz  . Drug use: No  . Sexual activity: No  Other Topics Concern  . Not on file  Social History Narrative   Lives alone   Caffeine use: none   Right handed    ALLERGIES:    Allergies  Allergen Reactions  . Codeine Nausea And Vomiting  . Darvon [Propoxyphene  Hcl] Nausea And Vomiting  . Flagyl [Metronidazole] Nausea And Vomiting    CURRENT MEDICATIONS:    Current Outpatient Medications  Medication Sig Dispense Refill  . acetaminophen (TYLENOL) 500 MG tablet Take 1,000 mg by mouth 2 (two) times daily as needed for pain.     Marland Kitchen aspirin EC 81 MG tablet Take 81 mg by mouth daily.    . baclofen (LIORESAL) 20 MG tablet Take 20 mg by mouth 3 (three) times daily.    Marland Kitchen buPROPion (WELLBUTRIN SR) 150 MG 12 hr tablet Take 150 mg by mouth 2 (two) times daily.    . Calcium-Vitamin D (CALTRATE 600 PLUS-VIT D PO) Take 1 tablet by mouth daily before supper.     . Cholecalciferol (VITAMIN D) 2000 UNITS CAPS Take 2,000 Units by mouth daily before supper.     . clopidogrel (PLAVIX) 75 MG tablet Take 1 tablet (75 mg total) by mouth daily with breakfast. 30 tablet 2  . levothyroxine (SYNTHROID, LEVOTHROID) 25 MCG tablet Take 25 mcg by mouth daily before breakfast.     . linaclotide (LINZESS) 72 MCG capsule Take 1 capsule (72 mcg total) by mouth daily before breakfast. 90 capsule 0  . losartan-hydrochlorothiazide (HYZAAR) 100-25 MG per tablet Take 1 tablet by mouth daily.    Marland Kitchen lubiprostone (AMITIZA) 24 MCG capsule Samples of this drug were given to the patient. 8 capsule 0  . Magnesium 250 MG TABS Take 250 mg by mouth daily before supper.    . mirabegron ER (MYRBETRIQ) 25 MG TB24 tablet Take 25 mg by mouth at bedtime.     . Multiple Vitamin (MULTIVITAMIN WITH MINERALS) TABS tablet Take 1 tablet by mouth daily before supper. Centrum Silver     . pantoprazole (PROTONIX) 40 MG tablet Take 40 mg by mouth daily.    . potassium gluconate 595 MG TABS Take 595 mg by mouth daily before supper.     . Probiotic Product (PROBIOTIC PO) Take 1 tablet by mouth daily after lunch.     . pyridOXINE (VITAMIN B-6) 100 MG tablet Take 100 mg by mouth daily before supper.     Marland Kitchen rOPINIRole (REQUIP) 1 MG tablet Take 1 mg by mouth at bedtime.  0  . simvastatin (ZOCOR) 40 MG tablet Take 40 mg  by mouth daily with supper.     . vitamin B-12 (CYANOCOBALAMIN) 500 MCG tablet Take 500 mcg  by mouth daily.     No current facility-administered medications for this visit.     REVIEW OF SYSTEMS:   [X]  denotes positive finding, [ ]  denotes negative finding Cardiac  Comments:  Chest pain or chest pressure:    Shortness of breath upon exertion:    Short of breath when lying flat:    Irregular heart rhythm:        Vascular    Pain in calf, thigh, or hip brought on by ambulation: x   Pain in feet at night that wakes you up from your sleep:  x   Blood clot in your veins:    Leg swelling:         Pulmonary    Oxygen at home:    Productive cough:     Wheezing:         Neurologic    Sudden weakness in arms or legs:  x   Sudden numbness in arms or legs:  x   Sudden onset of difficulty speaking or slurred speech:    Temporary loss of vision in one eye:     Problems with dizziness:         Gastrointestinal    Blood in stool:      Vomited blood:         Genitourinary    Burning when urinating:     Blood in urine:        Psychiatric    Major depression:         Hematologic    Bleeding problems:    Problems with blood clotting too easily:        Skin    Rashes or ulcers:        Constitutional    Fever or chills:     PHYSICAL EXAM:   Vitals:   02/27/17 1056  BP: (!) 165/81  Pulse: 77  Resp: 16  Temp: 98.1 F (36.7 C)  TempSrc: Oral  SpO2: 100%  Weight: 164 lb (74.4 kg)  Height: 5\' 2"  (1.575 m)    GENERAL: The patient is a well-nourished female, in no acute distress. The vital signs are documented above. CARDIAC: There is a regular rate and rhythm.  VASCULAR: Palpable bilateral radial pulses.  Palpable bilateral dorsalis pedis pulses PULMONARY: Nonlabored respirations ABDOMEN: Soft and non-tender with normal pitched bowel sounds.  MUSCULOSKELETAL: There are no major deformities or cyanosis.  Fluid around both knees. NEUROLOGIC: No focal weakness or  paresthesias are detected. SKIN: There are no ulcers or rashes noted. PSYCHIATRIC: The patient has a normal affect.  STUDIES:   I have ordered and reviewed the patient's ABIs.  The right leg is 1.02.  The left PICC is 1.04.  Both have triphasic waveforms.  ASSESSMENT and PLAN   Carotid stenosis: The patient has not had a carotid duplex in several years.  I will schedule her for a duplex in 1 year  Lower extremity PAD: The patient has a history of stenting in the remote past.  Her symptoms are not consistent with claudication.  She has palpable pedal pulses as well as a normal ankle-brachial index.  I will continue to perform surveillance imaging with a follow-up study in 1 year.  Bilateral leg pain: Her symptoms appear to be consistent with osteoarthritis in both of her knees.  She has not had repeat follow-up with orthopedics which I encouraged her to do.  Arm numbness: She recently had a EMG revealing carpal tunnel.  Further recommendations per neurology.  Annamarie Major, MD [facility] [phone] Pager [phone]

## 2017-03-07 ENCOUNTER — Other Ambulatory Visit: Payer: Self-pay | Admitting: Family Medicine

## 2017-03-07 ENCOUNTER — Other Ambulatory Visit (HOSPITAL_COMMUNITY)
Admission: RE | Admit: 2017-03-07 | Discharge: 2017-03-07 | Disposition: A | Discharge status: Home / Self Care | Payer: Medicare HMO | Source: Ambulatory Visit | Attending: Family Medicine | Admitting: Family Medicine

## 2017-03-07 DIAGNOSIS — F324 Major depressive disorder, single episode, in partial remission: Secondary | ICD-10-CM | POA: Diagnosis not present

## 2017-03-07 DIAGNOSIS — M1990 Unspecified osteoarthritis, unspecified site: Secondary | ICD-10-CM | POA: Diagnosis not present

## 2017-03-07 DIAGNOSIS — K219 Gastro-esophageal reflux disease without esophagitis: Secondary | ICD-10-CM | POA: Diagnosis not present

## 2017-03-07 DIAGNOSIS — Z0000 Encounter for general adult medical examination without abnormal findings: Secondary | ICD-10-CM | POA: Diagnosis not present

## 2017-03-07 DIAGNOSIS — Z23 Encounter for immunization: Secondary | ICD-10-CM | POA: Diagnosis not present

## 2017-03-07 DIAGNOSIS — N3281 Overactive bladder: Secondary | ICD-10-CM | POA: Diagnosis not present

## 2017-03-07 DIAGNOSIS — I10 Essential (primary) hypertension: Secondary | ICD-10-CM | POA: Diagnosis not present

## 2017-03-07 DIAGNOSIS — Z124 Encounter for screening for malignant neoplasm of cervix: Secondary | ICD-10-CM | POA: Insufficient documentation

## 2017-03-07 DIAGNOSIS — E7800 Pure hypercholesterolemia, unspecified: Secondary | ICD-10-CM | POA: Diagnosis not present

## 2017-03-07 DIAGNOSIS — E559 Vitamin D deficiency, unspecified: Secondary | ICD-10-CM | POA: Diagnosis not present

## 2017-03-07 DIAGNOSIS — R6889 Other general symptoms and signs: Secondary | ICD-10-CM | POA: Diagnosis not present

## 2017-03-07 DIAGNOSIS — E039 Hypothyroidism, unspecified: Secondary | ICD-10-CM | POA: Diagnosis not present

## 2017-03-09 LAB — CYTOLOGY - PAP: Diagnosis: NEGATIVE

## 2017-03-14 DIAGNOSIS — M47812 Spondylosis without myelopathy or radiculopathy, cervical region: Secondary | ICD-10-CM | POA: Diagnosis not present

## 2017-03-14 DIAGNOSIS — G5603 Carpal tunnel syndrome, bilateral upper limbs: Secondary | ICD-10-CM | POA: Diagnosis not present

## 2017-05-05 ENCOUNTER — Ambulatory Visit: Payer: Medicare HMO | Admitting: Podiatry

## 2017-05-05 DIAGNOSIS — B351 Tinea unguium: Secondary | ICD-10-CM

## 2017-05-05 DIAGNOSIS — D689 Coagulation defect, unspecified: Secondary | ICD-10-CM | POA: Diagnosis not present

## 2017-05-05 DIAGNOSIS — Q828 Other specified congenital malformations of skin: Secondary | ICD-10-CM | POA: Diagnosis not present

## 2017-05-05 NOTE — Progress Notes (Signed)
Subjective:  Patient ID: Vicki Page, female    DOB: Aug 17, 1950,  MRN: [id number]  No chief complaint on file.  67 y.o. female presents with the above complaint.  Reports bilateral thick discolored nails.  Reports callus to the bottom of both feet, right which is very painful.  Does not like the color of her nails as they are very dark.  On chronic anticoagulation therapy due to history of stroke  Past Medical History:  Diagnosis Date  . Adenomatous colon polyp   . 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 Anxiety   . Arthritis   . Atherosclerosis   . Barrett's esophagus   . Carotid artery occlusion   . DDD (degenerative disc disease), lumbar   . Diverticulosis   . DVT (deep venous thrombosis) ([place])   . Fall at home May 30, 2014   Pt slipped in tub- hurt left hip and right shoulder  . GERD (gastroesophageal reflux disease)   . H. pylori infection   . Hyperlipidemia   . Hypertension   . Hyperthyroidism   . IBS (irritable bowel syndrome)   . Osteoarthritis   . RLS (restless legs syndrome)   . Tubular adenoma of colon   . Ulcer of the stomach and intestine    Past Surgical History:  Procedure Laterality Date  . ANAL RECTAL MANOMETRY N/A 05/04/2016   Procedure: ANO RECTAL MANOMETRY;  Surgeon: Mauri Pole, MD;  Location: [facility] ENDOSCOPY;  Service: Endoscopy;  Laterality: N/A;  . ANEURYSM COILING  July 26, 2010   stent  . BRAIN SURGERY     Per patient " Left side behind eyes  . INSERTION OF ILIAC STENT  10/04/2012   Procedure: INSERTION OF ILIAC STENT;  Surgeon: Jettie Booze, MD;  [facility] LAB;  Service: Cardiovascular;;  Left External Iliac Artery  . IR GENERIC HISTORICAL  11/03/2015   IR ANGIO VERTEBRAL SEL VERTEBRAL UNI L MOD SED 11/03/2015 Luanne Bras, MD [facility]  . IR GENERIC HISTORICAL  11/03/2015   IR ANGIO INTRA EXTRACRAN SEL COM CAROTID INNOMINATE BILAT MOD SED 11/03/2015 Luanne Bras, MD [facility]  . IR GENERIC HISTORICAL  11/03/2015   IR ANGIO VERTEBRAL SEL SUBCLAVIAN INNOMINATE UNI R MOD SED 11/03/2015 Luanne Bras, MD [facility]  . IR GENERIC HISTORICAL  11/19/2015   IR RADIOLOGIST EVAL & MGMT 11/19/2015 [facility]  . LEG SURGERY    . LOWER EXTREMITY ANGIOGRAM Left 10-04-12   and Left iliac stent  . LOWER EXTREMITY ANGIOGRAM N/A 10/04/2012   Procedure: LOWER EXTREMITY ANGIOGRAM;  Surgeon: Jettie Booze, MD;  Location: [facility] CATH LAB;  Service: Cardiovascular;  Laterality: N/A;  . TUBAL LIGATION      Current Outpatient Medications:  .  acetaminophen (TYLENOL) 500 MG tablet, Take 1,000 mg by mouth 2 (two) times daily as needed for pain. , Disp: , Rfl:  .  aspirin EC 81 MG tablet, Take 81 mg by mouth daily., Disp: , Rfl:  .  baclofen (LIORESAL) 20 MG tablet, Take 20 mg by mouth 3 (three) times daily., Disp: , Rfl:  .  buPROPion (WELLBUTRIN SR) 150 MG 12 hr tablet, Take 150 mg by mouth 2 (two) times daily., Disp: , Rfl:  .  Calcium-Vitamin D (CALTRATE 600 PLUS-VIT D PO), Take 1 tablet by mouth daily before supper. , Disp: , Rfl:  .  Cholecalciferol (VITAMIN D) 2000 UNITS CAPS, Take 2,000 Units by mouth daily before supper. , Disp: , Rfl:  .  clopidogrel (PLAVIX) 75 MG tablet, Take 1 tablet (75 mg total) by mouth daily with breakfast., Disp: 30 tablet, Rfl: 2 .  levothyroxine (SYNTHROID, LEVOTHROID) 25 MCG tablet, Take 25 mcg by mouth daily before breakfast. , Disp: , Rfl:  .  linaclotide (LINZESS) 72 MCG capsule, Take 1 capsule (72 mcg total) by mouth daily before breakfast., Disp: 90 capsule, Rfl: 0 .  losartan-hydrochlorothiazide (HYZAAR) 100-25 MG per tablet, Take 1 tablet by mouth daily., Disp: , Rfl:  .  lubiprostone (AMITIZA) 24 MCG capsule, Samples of this drug were given to the patient., Disp: 8 capsule, Rfl: 0 .  Magnesium 250 MG TABS, Take 250 mg by mouth daily before supper., Disp: , Rfl:  .  mirabegron ER (MYRBETRIQ) 25 MG TB24 tablet, Take 25 mg by mouth at bedtime. , Disp: , Rfl:  .  Multiple  Vitamin (MULTIVITAMIN WITH MINERALS) TABS tablet, Take 1 tablet by mouth daily before supper. Centrum Silver , Disp: , Rfl:  .  pantoprazole (PROTONIX) 40 MG tablet, Take 40 mg by mouth daily., Disp: , Rfl:  .  potassium gluconate 595 MG TABS, Take 595 mg by mouth daily before supper. , Disp: , Rfl:  .  Probiotic Product (PROBIOTIC PO), Take 1 tablet by mouth daily after lunch. , Disp: , Rfl:  .  pyridOXINE (VITAMIN B-6) 100 MG tablet, Take 100 mg by mouth daily before supper. , Disp: , Rfl:  .  rOPINIRole (REQUIP) 1 MG tablet, Take 1 mg by mouth at bedtime., Disp: , Rfl: 0 .  simvastatin (ZOCOR) 40 MG tablet, Take 40 mg by mouth daily with supper. , Disp: , Rfl:  .  vitamin B-12 (CYANOCOBALAMIN) 500 MCG tablet, Take 500 mcg by mouth daily., Disp: , Rfl:   Allergies  Allergen Reactions  . Codeine Nausea And Vomiting  . Darvon [Propoxyphene Hcl] Nausea And Vomiting  . Flagyl [Metronidazole] Nausea And Vomiting   Review of Systems Objective:  There were no vitals filed for this visit. General AA&O x3. Normal mood and affect.  Vascular Dorsalis pedis and posterior tibial pulses  present 2+ bilaterally  Capillary refill normal to all digits. Pedal hair growth normal.  Neurologic Epicritic sensation grossly present.  Dermatologic No open lesions. Interspaces clear of maceration. Nails x10 elongated and thickened.  HP K left sub-met 2 right sub-met 4  Orthopedic: MMT 5/5 in dorsiflexion, plantarflexion, inversion, and eversion. Normal joint ROM without pain or crepitus.    Assessment & Plan:  Patient was evaluated and treated and all questions answered.  Onychomycosis, porokeratosis on anti-coagulation -Nails debrided x10 -Calluses pared x2  Procedure: Nail Debridement Rationale: Patient meets criteria for routine foot care due to coagulation defect Type of Debridement: manual, sharp debridement. Instrumentation: Nail nipper, rotary burr. Number of Nails: 10  Procedure: Paring of  Lesion Rationale: painful hyperkeratotic lesion Type of Debridement: manual, sharp debridement. Instrumentation: 312 blade Number of Lesions: 2  Return in about 3 months (around 08/05/2017) for Routine Foot Care - On Anticoagulation.

## 2017-06-12 DIAGNOSIS — R7309 Other abnormal glucose: Secondary | ICD-10-CM | POA: Diagnosis not present

## 2017-08-03 ENCOUNTER — Ambulatory Visit: Payer: Medicare HMO | Admitting: Podiatry

## 2017-08-03 DIAGNOSIS — B351 Tinea unguium: Secondary | ICD-10-CM

## 2017-08-03 DIAGNOSIS — Q828 Other specified congenital malformations of skin: Secondary | ICD-10-CM | POA: Diagnosis not present

## 2017-08-03 DIAGNOSIS — D689 Coagulation defect, unspecified: Secondary | ICD-10-CM | POA: Diagnosis not present

## 2017-08-06 NOTE — Progress Notes (Signed)
Subjective:  Patient ID: Vicki Page, female    DOB: August 18, 1950,  MRN: [id number]  Chief Complaint  Patient presents with  . Nail Problem    3 month nail trim   67 y.o. female presents with the above complaint.  Presents for routine care no new issues still on AC.  Past Medical History:  Diagnosis Date  . Adenomatous colon polyp   . 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 Anxiety   . Arthritis   . Atherosclerosis   . Barrett's esophagus   . Carotid artery occlusion   . DDD (degenerative disc disease), lumbar   . Diverticulosis   . DVT (deep venous thrombosis) ([place])   . Fall at home May 30, 2014   Pt slipped in tub- hurt left hip and right shoulder  . GERD (gastroesophageal reflux disease)   . H. pylori infection   . Hyperlipidemia   . Hypertension   . Hyperthyroidism   . IBS (irritable bowel syndrome)   . Osteoarthritis   . RLS (restless legs syndrome)   . Tubular adenoma of colon   . Ulcer of the stomach and intestine    Past Surgical History:  Procedure Laterality Date  . ANAL RECTAL MANOMETRY N/A 05/04/2016   Procedure: ANO RECTAL MANOMETRY;  Surgeon: Mauri Pole, MD;  Location: [facility];  Service: Endoscopy;  Laterality: N/A;  . ANEURYSM COILING  July 26, 2010   stent  . BRAIN SURGERY     Per patient " Left side behind eyes  . INSERTION OF ILIAC STENT  10/04/2012   Procedure: INSERTION OF ILIAC STENT;  Surgeon: Jettie Booze, MD;  Location: [facility] CATH LAB;  Service: Cardiovascular;;  Left External Iliac Artery  . IR GENERIC HISTORICAL  11/03/2015   IR ANGIO VERTEBRAL SEL VERTEBRAL UNI L MOD SED 11/03/2015 Luanne Bras, MD [facility]  . IR GENERIC HISTORICAL  11/03/2015   IR ANGIO INTRA EXTRACRAN SEL COM CAROTID INNOMINATE BILAT MOD SED 11/03/2015 Luanne Bras, MD [facility]  . IR GENERIC HISTORICAL  11/03/2015   IR ANGIO VERTEBRAL SEL SUBCLAVIAN INNOMINATE UNI R MOD SED 11/03/2015 Luanne Bras, MD [facility]  . IR GENERIC HISTORICAL   11/19/2015   IR RADIOLOGIST EVAL & MGMT 11/19/2015 [facility]  . LEG SURGERY    . LOWER EXTREMITY ANGIOGRAM Left 10-04-12   and Left iliac stent  . LOWER EXTREMITY ANGIOGRAM N/A 10/04/2012   Procedure: LOWER EXTREMITY ANGIOGRAM;  Surgeon: Jettie Booze, MD;  Location: [facility] CATH LAB;  Service: Cardiovascular;  Laterality: N/A;  . TUBAL LIGATION      Current Outpatient Medications:  .  acetaminophen (TYLENOL) 500 MG tablet, Take 1,000 mg by mouth 2 (two) times daily as needed for pain. , Disp: , Rfl:  .  aspirin EC 81 MG tablet, Take 81 mg by mouth daily., Disp: , Rfl:  .  baclofen (LIORESAL) 20 MG tablet, Take 20 mg by mouth 3 (three) times daily., Disp: , Rfl:  .  buPROPion (WELLBUTRIN SR) 150 MG 12 hr tablet, Take 150 mg by mouth 2 (two) times daily., Disp: , Rfl:  .  Calcium-Vitamin D (CALTRATE 600 PLUS-VIT D PO), Take 1 tablet by mouth daily before supper. , Disp: , Rfl:  .  Cholecalciferol (VITAMIN D) 2000 UNITS CAPS, Take 2,000 Units by mouth daily before supper. , Disp: , Rfl:  .  clopidogrel (PLAVIX) 75 MG tablet, Take 1 tablet (75 mg total) by mouth daily with breakfast., Disp:  30 tablet, Rfl: 2 .  levothyroxine (SYNTHROID, LEVOTHROID) 25 MCG tablet, Take 25 mcg by mouth daily before breakfast. , Disp: , Rfl:  .  linaclotide (LINZESS) 72 MCG capsule, Take 1 capsule (72 mcg total) by mouth daily before breakfast., Disp: 90 capsule, Rfl: 0 .  losartan-hydrochlorothiazide (HYZAAR) 100-25 MG per tablet, Take 1 tablet by mouth daily., Disp: , Rfl:  .  lubiprostone (AMITIZA) 24 MCG capsule, Samples of this drug were given to the patient., Disp: 8 capsule, Rfl: 0 .  Magnesium 250 MG TABS, Take 250 mg by mouth daily before supper., Disp: , Rfl:  .  mirabegron ER (MYRBETRIQ) 25 MG TB24 tablet, Take 25 mg by mouth at bedtime. , Disp: , Rfl:  .  Multiple Vitamin (MULTIVITAMIN WITH MINERALS) TABS tablet, Take 1 tablet by mouth daily before supper. Centrum Silver , Disp: , Rfl:  .   pantoprazole (PROTONIX) 40 MG tablet, Take 40 mg by mouth daily., Disp: , Rfl:  .  potassium gluconate 595 MG TABS, Take 595 mg by mouth daily before supper. , Disp: , Rfl:  .  Probiotic Product (PROBIOTIC PO), Take 1 tablet by mouth daily after lunch. , Disp: , Rfl:  .  pyridOXINE (VITAMIN B-6) 100 MG tablet, Take 100 mg by mouth daily before supper. , Disp: , Rfl:  .  rOPINIRole (REQUIP) 1 MG tablet, Take 1 mg by mouth at bedtime., Disp: , Rfl: 0 .  simvastatin (ZOCOR) 40 MG tablet, Take 40 mg by mouth daily with supper. , Disp: , Rfl:  .  vitamin B-12 (CYANOCOBALAMIN) 500 MCG tablet, Take 500 mcg by mouth daily., Disp: , Rfl:   Allergies  Allergen Reactions  . Codeine Nausea And Vomiting  . Darvon [Propoxyphene Hcl] Nausea And Vomiting  . Flagyl [Metronidazole] Nausea And Vomiting     Objective:  There were no vitals filed for this visit. General AA&O x3. Normal mood and affect.  Vascular Dorsalis pedis and posterior tibial pulses  present 2+ bilaterally  Capillary refill normal to all digits. Pedal hair growth normal.  Neurologic Epicritic sensation grossly present.  Dermatologic No open lesions. Interspaces clear of maceration. Nails x10 elongated and thickened.  HP K left sub-met 2 right sub-met 4 medial hallux bilat  Orthopedic: MMT 5/5 in dorsiflexion, plantarflexion, inversion, and eversion. Normal joint ROM without pain or crepitus.    Assessment & Plan:  Patient was evaluated and treated and all questions answered.  Onychomycosis, porokeratosis on anti-coagulation -Nails debrided x10 -Calluses pared x4  Procedure: Nail Debridement Rationale: Patient meets criteria for routine foot care due to coagulation defect Type of Debridement: manual, sharp debridement. Instrumentation: Nail nipper, rotary burr. Number of Nails: 10  Procedure: Paring of Lesion Rationale: painful hyperkeratotic lesion Type of Debridement: manual, sharp debridement. Instrumentation: 312  blade Number of Lesions: 4  No follow-ups on file.

## 2017-09-06 ENCOUNTER — Other Ambulatory Visit: Payer: Self-pay | Admitting: Family Medicine

## 2017-09-06 DIAGNOSIS — Z1231 Encounter for screening mammogram for malignant neoplasm of breast: Secondary | ICD-10-CM

## 2017-09-13 ENCOUNTER — Encounter: Payer: Self-pay | Admitting: Internal Medicine

## 2017-09-21 DIAGNOSIS — N898 Other specified noninflammatory disorders of vagina: Secondary | ICD-10-CM | POA: Diagnosis not present

## 2017-09-21 DIAGNOSIS — R350 Frequency of micturition: Secondary | ICD-10-CM | POA: Diagnosis not present

## 2017-09-21 DIAGNOSIS — N760 Acute vaginitis: Secondary | ICD-10-CM | POA: Diagnosis not present

## 2017-10-03 ENCOUNTER — Ambulatory Visit
Admission: RE | Admit: 2017-10-03 | Discharge: 2017-10-03 | Disposition: A | Discharge status: Home / Self Care | Payer: Medicare HMO | Source: Ambulatory Visit | Attending: Family Medicine | Admitting: Family Medicine

## 2017-10-03 DIAGNOSIS — Z1231 Encounter for screening mammogram for malignant neoplasm of breast: Secondary | ICD-10-CM

## 2017-10-04 ENCOUNTER — Other Ambulatory Visit: Payer: Self-pay | Admitting: Family Medicine

## 2017-10-04 DIAGNOSIS — N6489 Other specified disorders of breast: Secondary | ICD-10-CM | POA: Diagnosis not present

## 2017-10-04 DIAGNOSIS — R928 Other abnormal and inconclusive findings on diagnostic imaging of breast: Secondary | ICD-10-CM

## 2017-10-06 ENCOUNTER — Ambulatory Visit
Admission: RE | Admit: 2017-10-06 | Discharge: 2017-10-06 | Disposition: A | Discharge status: Home / Self Care | Payer: Medicare HMO | Source: Ambulatory Visit | Attending: Family Medicine | Admitting: Family Medicine

## 2017-10-06 DIAGNOSIS — R928 Other abnormal and inconclusive findings on diagnostic imaging of breast: Secondary | ICD-10-CM

## 2017-10-20 DIAGNOSIS — E7800 Pure hypercholesterolemia, unspecified: Secondary | ICD-10-CM | POA: Diagnosis not present

## 2017-10-20 DIAGNOSIS — K2270 Barrett's esophagus without dysplasia: Secondary | ICD-10-CM | POA: Diagnosis not present

## 2017-10-20 DIAGNOSIS — R7303 Prediabetes: Secondary | ICD-10-CM | POA: Diagnosis not present

## 2017-10-20 DIAGNOSIS — I739 Peripheral vascular disease, unspecified: Secondary | ICD-10-CM | POA: Diagnosis not present

## 2017-10-20 DIAGNOSIS — N760 Acute vaginitis: Secondary | ICD-10-CM | POA: Diagnosis not present

## 2017-10-20 DIAGNOSIS — G2581 Restless legs syndrome: Secondary | ICD-10-CM | POA: Diagnosis not present

## 2017-10-20 DIAGNOSIS — M1990 Unspecified osteoarthritis, unspecified site: Secondary | ICD-10-CM | POA: Diagnosis not present

## 2017-10-20 DIAGNOSIS — M5416 Radiculopathy, lumbar region: Secondary | ICD-10-CM | POA: Diagnosis not present

## 2017-10-20 DIAGNOSIS — I10 Essential (primary) hypertension: Secondary | ICD-10-CM | POA: Diagnosis not present

## 2017-10-20 DIAGNOSIS — E039 Hypothyroidism, unspecified: Secondary | ICD-10-CM | POA: Diagnosis not present

## 2017-11-02 ENCOUNTER — Ambulatory Visit: Payer: Medicare HMO | Admitting: Podiatry

## 2017-11-02 ENCOUNTER — Encounter: Payer: Self-pay | Admitting: Podiatry

## 2017-11-02 DIAGNOSIS — B351 Tinea unguium: Secondary | ICD-10-CM

## 2017-11-02 DIAGNOSIS — D689 Coagulation defect, unspecified: Secondary | ICD-10-CM

## 2017-11-02 DIAGNOSIS — Q828 Other specified congenital malformations of skin: Secondary | ICD-10-CM | POA: Diagnosis not present

## 2017-11-02 NOTE — Progress Notes (Signed)
Subjective:  Patient ID: Vicki Page, female    DOB: August 10, 1950,  MRN: [id number]  Chief Complaint  Patient presents with  . Callouses    Bilateral Debridement    67 y.o. female presents with the above complaint. Reports painful calluses to both feet, worse on right. Elongated nails to both feet..  Still on anticoagulant.  Review of Systems: Negative except as noted in the HPI. Denies N/V/F/Ch.  Past Medical History:  Diagnosis Date  . Adenomatous colon polyp   . 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 Anxiety   . Arthritis   . Atherosclerosis   . Barrett's esophagus   . Carotid artery occlusion   . DDD (degenerative disc disease), lumbar   . Diverticulosis   . DVT (deep venous thrombosis) ([place])   . Fall at home May 30, 2014   Pt slipped in tub- hurt left hip and right shoulder  . GERD (gastroesophageal reflux disease)   . H. pylori infection   . Hyperlipidemia   . Hypertension   . Hyperthyroidism   . IBS (irritable bowel syndrome)   . Osteoarthritis   . RLS (restless legs syndrome)   . Tubular adenoma of colon   . Ulcer of the stomach and intestine     Current Outpatient Medications:  .  acetaminophen (TYLENOL) 500 MG tablet, Take 1,000 mg by mouth 2 (two) times daily as needed for pain. , Disp: , Rfl:  .  aspirin EC 81 MG tablet, Take 81 mg by mouth daily., Disp: , Rfl:  .  baclofen (LIORESAL) 20 MG tablet, Take 20 mg by mouth 3 (three) times daily., Disp: , Rfl:  .  buPROPion (WELLBUTRIN SR) 150 MG 12 hr tablet, Take 150 mg by mouth 2 (two) times daily., Disp: , Rfl:  .  Calcium-Vitamin D (CALTRATE 600 PLUS-VIT D PO), Take 1 tablet by mouth daily before supper. , Disp: , Rfl:  .  Cholecalciferol (VITAMIN D) 2000 UNITS CAPS, Take 2,000 Units by mouth daily before supper. , Disp: , Rfl:  .  clopidogrel (PLAVIX) 75 MG tablet, Take 1 tablet (75 mg total) by mouth daily with breakfast., Disp: 30 tablet, Rfl: 2 .  levothyroxine (SYNTHROID, LEVOTHROID) 25 MCG tablet, Take  25 mcg by mouth daily before breakfast. , Disp: , Rfl:  .  linaclotide (LINZESS) 72 MCG capsule, Take 1 capsule (72 mcg total) by mouth daily before breakfast., Disp: 90 capsule, Rfl: 0 .  losartan-hydrochlorothiazide (HYZAAR) 100-25 MG per tablet, Take 1 tablet by mouth daily., Disp: , Rfl:  .  lubiprostone (AMITIZA) 24 MCG capsule, Samples of this drug were given to the patient., Disp: 8 capsule, Rfl: 0 .  Magnesium 250 MG TABS, Take 250 mg by mouth daily before supper., Disp: , Rfl:  .  mirabegron ER (MYRBETRIQ) 25 MG TB24 tablet, Take 25 mg by mouth at bedtime. , Disp: , Rfl:  .  Multiple Vitamin (MULTIVITAMIN WITH MINERALS) TABS tablet, Take 1 tablet by mouth daily before supper. Centrum Silver , Disp: , Rfl:  .  pantoprazole (PROTONIX) 40 MG tablet, Take 40 mg by mouth daily., Disp: , Rfl:  .  potassium gluconate 595 MG TABS, Take 595 mg by mouth daily before supper. , Disp: , Rfl:  .  Probiotic Product (PROBIOTIC PO), Take 1 tablet by mouth daily after lunch. , Disp: , Rfl:  .  pyridOXINE (VITAMIN B-6) 100 MG tablet, Take 100 mg by mouth daily before supper. , Disp: , Rfl:  .  rOPINIRole (REQUIP) 1 MG tablet, Take 1 mg by mouth at bedtime., Disp: , Rfl: 0 .  simvastatin (ZOCOR) 40 MG tablet, Take 40 mg by mouth daily with supper. , Disp: , Rfl:  .  vitamin B-12 (CYANOCOBALAMIN) 500 MCG tablet, Take 500 mcg by mouth daily., Disp: , Rfl:   Social History   Tobacco Use  Smoking Status Light Tobacco Smoker  . Packs/day: 0.25  . Types: Cigarettes  Smokeless Tobacco Never Used  Tobacco Comment   7 or more depending on the day    Allergies  Allergen Reactions  . Codeine Nausea And Vomiting  . Darvon [Propoxyphene Hcl] Nausea And Vomiting  . Flagyl [Metronidazole] Nausea And Vomiting   Objective:  There were no vitals filed for this visit. There is no height or weight on file to calculate BMI. Constitutional Well developed. Well nourished.  Vascular Dorsalis pedis pulses palpable  bilaterally. Posterior tibial pulses palpable bilaterally. Capillary refill normal to all digits.  No cyanosis or clubbing noted. Pedal hair growth normal.  Neurologic Normal speech. Oriented to person, place, and time. Epicritic sensation to light touch grossly present bilaterally.  Dermatologic Nails elongated dystrophic pain to palpation No open wounds. HPK submet 2 L, 3 R; Hallux bilat, 1st MPJ bilat  Orthopedic: Normal joint ROM without pain or crepitus bilaterally. No visible deformities. No bony tenderness.   Radiographs: None Assessment:   1. Onychomycosis   2. 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  Onychomycosis with coagulation defect -Nails palliatively debridement as below  Procedure: Nail Debridement Rationale: antocoagulant Type of Debridement: manual, sharp debridement. Instrumentation: Nail nipper, rotary burr. Number of Nails: 10  Procedure: Paring of Lesion Rationale: painful hyperkeratotic lesion Type of Debridement: manual, sharp debridement. Instrumentation: 312 blade Number of Lesions: 6   Return in about 10 weeks (around 01/11/2018) for Routine Foot Care - On Anticoagulant.

## 2017-11-15 ENCOUNTER — Ambulatory Visit: Payer: Medicare HMO | Admitting: Internal Medicine

## 2017-11-15 ENCOUNTER — Telehealth: Payer: Self-pay | Admitting: *Deleted

## 2017-11-15 ENCOUNTER — Encounter: Payer: Self-pay | Admitting: Internal Medicine

## 2017-11-15 VITALS — BP 128/76 | HR 84 | Ht 62.0 in | Wt 154.0 lb

## 2017-11-15 DIAGNOSIS — Z7902 Long term (current) use of antithrombotics/antiplatelets: Secondary | ICD-10-CM | POA: Diagnosis not present

## 2017-11-15 DIAGNOSIS — K2270 Barrett's esophagus without dysplasia: Secondary | ICD-10-CM | POA: Diagnosis not present

## 2017-11-15 DIAGNOSIS — Z86010 Personal history of colonic polyps: Secondary | ICD-10-CM | POA: Diagnosis not present

## 2017-11-15 DIAGNOSIS — R1310 Dysphagia, unspecified: Secondary | ICD-10-CM | POA: Diagnosis not present

## 2017-11-15 DIAGNOSIS — K5909 Other constipation: Secondary | ICD-10-CM | POA: Diagnosis not present

## 2017-11-15 NOTE — Patient Instructions (Signed)
You have been scheduled for an endoscopy. Please follow written instructions given to you at your visit today. If you use inhalers (even only as needed), please bring them with you on the day of your procedure. Your physician has requested that you go to www.startemmi.com and enter the access code given to you at your visit today. This web site gives a general overview about your procedure. However, you should still follow specific instructions given to you by our office regarding your preparation for the procedure.  You will be contacted by our office prior to your procedure for directions on holding your Plavix.  If you do not hear from our office 1 week prior to your scheduled procedure, please call [phone] to discuss.   Continue Pantoprazole 40 mg daily.  Please purchase the following medications over the counter and take as directed: Benefiber 1-2 tablespoons daily IB gard per box instructions for bloating  If you are age 80 or older, your body mass index should be between 23-30. Your Body mass index is 28.17 kg/m. If this is out of the aforementioned range listed, please consider follow up with your Primary Care Provider.  If you are age 68 or younger, your body mass index should be between 19-25. Your Body mass index is 28.17 kg/m. If this is out of the aformentioned range listed, please consider follow up with your Primary Care Provider.

## 2017-11-15 NOTE — Progress Notes (Signed)
Subjective:    Patient ID: Vicki Page, female    DOB: 1950/02/23, 67 y.o.   MRN: [id number]  HPI Vicki Page is a 67 year old female with history of GERD with Barrett's esophagus, chronic constipation with incomplete defecation, history of colon polyps, DVT on Plavix, hypertension and hyperlipidemia here for follow-up.  Last seen on 08/11/2016.  She underwent anorectal manometry to evaluate dyssynergy defecation but this was normal.  Most recently she has been having some issues with solid food dysphagia.  This is worse with foods such as apples and dry meats.  She is using pantoprazole 40 mg daily but not having any heartburn symptoms.  Some mild nausea from time to time without vomiting.  No odynophagia.  Her last upper endoscopy was performed 3 years ago and showed short segment Barrett's esophagus without dysplasia.  Stomach was normal and biopsies showed mild chronic inflammation without H. pylori.  Her bowel habits are usually daily but again she often feels are incomplete.  She feels the need to continue wiping to get fully clean.  Some days she will have a complete and normal bowel movement.  Her last colonoscopy was 3 years ago which showed an area of resolving inflammation in the ascending colon which was biopsied.  There was moderate diverticulosis in the transverse and left colon and a 5 mm sigmoid polyp.  Biopsies from the right colon did show findings consistent with chronic resolving ischemic injury.  The sigmoid polyp was an adenoma without high-grade dysplasia.   Review of Systems As per HPI, otherwise negative  Current Medications, Allergies, Past Medical History, Past Surgical History, Family History and Social History were reviewed in [employer] record.     Objective:   Physical Exam BP 128/76   Pulse 84   Ht 5\' 2"  (1.575 m)   Wt 154 lb (69.9 kg)   BMI 28.17 kg/m  Constitutional: Well-developed and well-nourished. No distress. HEENT:  Normocephalic and atraumatic.  Conjunctivae are normal.  No scleral icterus. Neck: Neck supple. Trachea midline. Cardiovascular: Normal rate, regular rhythm and intact distal pulses. No M/R/G Pulmonary/chest: Effort normal and breath sounds normal. No wheezing, rales or rhonchi. Abdominal: Soft, nontender, nondistended. Bowel sounds active throughout. There are no masses palpable. No hepatosplenomegaly. Extremities: no clubbing, cyanosis, or edema Neurological: Alert and oriented to person place and time. Skin: Skin is warm and dry. Psychiatric: Normal mood and affect. Behavior is normal.      Assessment & Plan:  67 year old female with history of GERD with Barrett's esophagus, chronic constipation with incomplete defecation, history of colon polyps, DVT on Plavix, hypertension and hyperlipidemia here for follow-up.  1.  GERD/Barrett's esophagus/dysphagia --Barrett's surveillance endoscopy due at this time.  Also proceeding to endoscopy to evaluate dysphagia.  May need esophageal dilation.  We will need to hold her Plavix and contact her prescribing physician in order to seek permission.  We discussed the risk, benefits and alternatives and she is agreeable and wishes to proceed.  Heartburn appears well controlled and I recommend we continue Protonix 40 mg daily.  Hold Plavix 5 days before procedure - will instruct when and how to resume after procedure. Risks and benefits of procedure including bleeding, perforation, infection, missed lesions, medication reactions and possible hospitalization or surgery if complications occur explained. Additional rare but real risk of cardiovascular event such as heart attack or ischemia/infarct of other organs off Plavix explained and need to seek urgent help if this occurs. Will communicate by phone or EMR  with patient's prescribing provider that to confirm holding Plavix is reasonable in this case.   2.  Chronic constipation and incomplete defecation --add  Benefiber 1 to 2 tablespoons daily.  Samples given of IBgard 1 to 2 tablets with meals to see if this helps with bowel regularity but also abdominal bloating symptom.  3.  History of adenomatous colon polyp --surveillance colonoscopy recommended October 2021  25 minutes spent with the patient today. Greater than 50% was spent in counseling and coordination of care with the patient

## 2017-11-15 NOTE — Telephone Encounter (Signed)
   Vicki Page [birth year] [id number]  Dear Dr Trula Slade:  We have scheduled the above named patient for a(n) endoscopy procedure. Our records show that (s)he is on anticoagulation therapy.  Please advise as to whether the patient may come off their therapy of Plavix 5 days prior to their procedure which is scheduled for 12/19/17.  Please route your response to Dixon Boos, [place] or fax response to [phone].  Sincerely,  [facility]

## 2017-11-22 NOTE — Telephone Encounter (Signed)
Called and spoke to Chelan at [place] office.  He is out of the office until 10-21. She will see if another provider can take a look at the request and get back to [place].

## 2017-11-23 NOTE — Telephone Encounter (Signed)
Vicki Page is trying to help [place] with the clearance letter before the procedure. Wonders if we can re-fax the original letter to her attention at [facility] [phone].

## 2017-11-23 NOTE — Telephone Encounter (Signed)
Letter faxed to attn: Caryl Pina at Dr [facility] office.

## 2017-11-30 NOTE — Telephone Encounter (Signed)
Left voicemail on Dr Stephens Sh[facility] clinical voicemail asking for help regarding plavix clearance 5 days prior to endoscopy procedure currently scheduled for 12/19/17.

## 2017-12-01 NOTE — Telephone Encounter (Signed)
Lilly from Dr [facility] office returned my call. She is trying to help get clearance for patient's anticoag prior to her scheduled endoscopy procedure. It seems the confusion was in that they were not getting the actual letter portion of the conversation from our [place] encounter. I will refax this to [phone], attn: Lilly and she will try to get an answer.

## 2017-12-04 NOTE — Telephone Encounter (Signed)
Request for surgical clearance:     Endoscopy Procedure  What type of surgery is being performed?     endoscopy  When is this surgery scheduled?     12/19/17  What type of clearance is required ?   Pharmacy  Are there any medications that need to be held prior to surgery and how long? Plavix, 5 days  Practice name and name of physician performing surgery?      [facility]  What is your office phone and fax number?      Phone- [phone]  [phone]  Anesthesia type (None, local, MAC, general) ?       MAC

## 2017-12-04 NOTE — Telephone Encounter (Signed)
Per Lilly [facility], Dr Trula Slade states that Dr Irish Lack prescribed Plavix for the patient, therefore plavix clearance should come from him. Will send letter there.

## 2017-12-04 NOTE — Telephone Encounter (Signed)
   Primary Cardiologist: Larae Grooms, MD  Chart reviewed as part of pre-operative protocol coverage. Hx of PVD s/p left external iliac stenting by Dr. Irish Lack in 2014 and started on DAPT. Patient hasn't followed by with Dr. Irish Lack since then. However Dr. Trula Slade following claudications symptoms with ABIs (last done 02/2017).  Dr. Irish Lack, does patient needs new provider appointment or should be address by Dr. Trula Slade (will forwards chart as well)?  Please forward your response to [facility].   Thank you  Leanor Kail, PA 12/04/2017, 1:49 PM

## 2017-12-05 NOTE — Telephone Encounter (Signed)
I have not seen her since 2014.  I would defer to Dr. Trula Slade.

## 2017-12-06 NOTE — Telephone Encounter (Signed)
Letter has now been faxed and sent electronically to Dr Shirline Frees' office. Will await response.

## 2017-12-06 NOTE — Telephone Encounter (Signed)
   ALANYS GODINO September 12, 1950 [id number]  Dear Dr Kenton Kingfisher:  We have scheduled the above named patient for a(n) endoscopy procedure. Our records show that (s)he is on anticoagulation therapy.  Please advise as to whether the patient may come off their therapy of Plavix 5 days prior to their procedure which is scheduled for 12/19/17.  Please fax your response to Dixon Boos, [place] at [phone].  Sincerely,    [facility]

## 2017-12-06 NOTE — Telephone Encounter (Signed)
Patient for upper endoscopy for Barrett's surveillance but we are seeking permission to hold clopidogrel before this procedure Both Dr. Trula Slade and Irish Lack have not seen the patient at an interval in which they feel comfortable making this recommendation Given that this medicine is prescribed by primary care, would defer to primary care regarding holding Plavix 5 days before endoscopy

## 2017-12-07 NOTE — Telephone Encounter (Signed)
Informed patient to hold Plavix x 5 days prior to her procedure per Dr. Kenton Kingfisher. Patient verbalized understanding. Faxed response to be scanned into Epic.

## 2017-12-08 ENCOUNTER — Encounter: Payer: Self-pay | Admitting: Internal Medicine

## 2017-12-19 ENCOUNTER — Ambulatory Visit (AMBULATORY_SURGERY_CENTER): Payer: Medicare HMO | Admitting: Internal Medicine

## 2017-12-19 ENCOUNTER — Encounter: Payer: Self-pay | Admitting: Internal Medicine

## 2017-12-19 VITALS — BP 161/77 | HR 62 | Temp 97.3°F | Resp 16 | Ht 62.0 in | Wt 154.0 lb

## 2017-12-19 DIAGNOSIS — I10 Essential (primary) hypertension: Secondary | ICD-10-CM | POA: Diagnosis not present

## 2017-12-19 DIAGNOSIS — R1310 Dysphagia, unspecified: Secondary | ICD-10-CM

## 2017-12-19 DIAGNOSIS — M1990 Unspecified osteoarthritis, unspecified site: Secondary | ICD-10-CM | POA: Diagnosis not present

## 2017-12-19 DIAGNOSIS — K219 Gastro-esophageal reflux disease without esophagitis: Secondary | ICD-10-CM | POA: Diagnosis not present

## 2017-12-19 DIAGNOSIS — K2270 Barrett's esophagus without dysplasia: Secondary | ICD-10-CM

## 2017-12-19 MED ORDER — SODIUM CHLORIDE 0.9 % IV SOLN
500.0000 mL | Freq: Once | INTRAVENOUS | Status: DC
Start: 2017-12-19 — End: 2017-12-19

## 2017-12-19 NOTE — Patient Instructions (Signed)
Can restart Plavix Tomorrow, 12/20/17  Post Dilation diet for the rest of the day, handout given  YOU HAD AN ENDOSCOPIC PROCEDURE TODAY AT [facility]:   Refer to the procedure report that was given to you for any specific questions about what was found during the examination.  If the procedure report does not answer your questions, please call your gastroenterologist to clarify.  If you requested that your care partner not be given the details of your procedure findings, then the procedure report has been included in a sealed envelope for you to review at your convenience later.  YOU SHOULD EXPECT: Some feelings of bloating in the abdomen. Passage of more gas than usual.  Walking can help get rid of the air that was put into your GI tract during the procedure and reduce the bloating. If you had a lower endoscopy (such as a colonoscopy or flexible sigmoidoscopy) you may notice spotting of blood in your stool or on the toilet paper. If you underwent a bowel prep for your procedure, you may not have a normal bowel movement for a few days.  Please Note:  You might notice some irritation and congestion in your nose or some drainage.  This is from the oxygen used during your procedure.  There is no need for concern and it should clear up in a day or so.  SYMPTOMS TO REPORT IMMEDIATELY:   Following upper endoscopy (EGD)  Vomiting of blood or coffee ground material  New chest pain or pain under the shoulder blades  Painful or persistently difficult swallowing  New shortness of breath  Fever of 100F or higher  Black, tarry-looking stools  For urgent or emergent issues, a gastroenterologist can be reached at any hour by calling [phone].   DIET:  We do recommend a small meal at first, but then you may proceed to your regular diet.  Drink plenty of fluids but you should avoid alcoholic beverages for 24 hours.  ACTIVITY:  You should plan to take it easy for the rest of today  and you should NOT DRIVE or use heavy machinery until tomorrow (because of the sedation medicines used during the test).    FOLLOW UP: Our staff will call the number listed on your records the next business day following your procedure to check on you and address any questions or concerns that you may have regarding the information given to you following your procedure. If we do not reach you, we will leave a message.  However, if you are feeling well and you are not experiencing any problems, there is no need to return our call.  We will assume that you have returned to your regular daily activities without incident.  If any biopsies were taken you will be contacted by phone or by letter within the next 1-3 weeks.  Please call us at [phone] if you have not heard about the biopsies in 3 weeks.    SIGNATURES/CONFIDENTIALITY: You and/or your care partner have signed paperwork which will be entered into your electronic medical record.  These signatures attest to the fact that that the information above on your After Visit Summary has been reviewed and is understood.  Full responsibility of the confidentiality of this discharge information lies with you and/or your care-partner.

## 2017-12-19 NOTE — Op Note (Signed)
[facility] Patient Name: Vicki Page Procedure Date: 12/19/2017 2:55 PM MRN: [id number] Endoscopist: Jerene Bears , MD Age: 67 Referring MD:  Date of Birth: 1950-09-30 Gender: Female Account #: [id number] Procedure:                Upper GI endoscopy Indications:              Dysphagia, Follow-up of Barrett's esophagus, last                            EGD 3 years ago Medicines:                Monitored Anesthesia Care Procedure:                Pre-Anesthesia Assessment:                           - Prior to the procedure, a History and Physical                            was performed, and patient medications and                            allergies were reviewed. The patient's tolerance of                            previous anesthesia was also reviewed. The risks                            and benefits of the procedure and the sedation                            options and risks were discussed with the patient.                            All questions were answered, and informed consent                            was obtained. Prior Anticoagulants: The patient has                            taken Plavix (clopidogrel), last dose was 5 days                            prior to procedure. ASA Grade Assessment: III - A                            patient with severe systemic disease. After                            reviewing the risks and benefits, the patient was                            deemed in satisfactory condition to undergo the  procedure.                           After obtaining informed consent, the endoscope was                            passed under direct vision. Throughout the                            procedure, the patient's blood pressure, pulse, and                            oxygen saturations were monitored continuously. The                            Endoscope was introduced through the mouth, and   advanced to the second part of duodenum. The upper                            GI endoscopy was accomplished without difficulty.                            The patient tolerated the procedure well. Scope In: Scope Out: Findings:                 The Z-line was irregular and was found 39 cm from                            the incisors. Four biopsies were obtained with cold                            forceps for histology and evaluation to rule out                            Barrett's Esophagus in a targeted manner at the                            gastroesophageal junction.                           No endoscopic abnormality was evident in the                            esophagus to explain the patient's complaint of                            dysphagia. It was decided, however, to proceed with                            dilation of the entire esophagus. A guidewire was                            placed and the scope was withdrawn. Dilation was  performed with a Savary dilator with mild                            resistance at 17 mm.                           The entire examined stomach was normal.                           The examined duodenum was normal. Complications:            No immediate complications. Estimated Blood Loss:     Estimated blood loss was minimal. Impression:               - Z-line irregular, 39 cm from the incisors.                           - No endoscopic esophageal abnormality to explain                            patient's dysphagia. Esophagus dilated with 17 mm                            Savary.                           - Normal stomach.                           - Normal examined duodenum.                           - Four biopsies were obtained at the                            gastroesophageal junction. Recommendation:           - Patient has a contact number available for                            emergencies. The signs and  symptoms of potential                            delayed complications were discussed with the                            patient. Return to normal activities tomorrow.                            Written discharge instructions were provided to the                            patient.                           - Resume previous diet.                           -  Continue present medications.                           - Await pathology results.                           - Repeat upper endoscopy for surveillance based on                            pathology results.                           - Resume Plavix (clopidogrel) at prior dose                            tomorrow. Refer to managing physician for further                            adjustment of therapy. Jerene Bears, MD 12/19/2017 3:12:39 PM This report has been signed electronically.

## 2017-12-19 NOTE — Progress Notes (Signed)
Called to room to assist during endoscopic procedure.  Patient ID and intended procedure confirmed with present staff. Received instructions for my participation in the procedure from the performing physician.  

## 2017-12-19 NOTE — Progress Notes (Signed)
Pt's states no medical or surgical changes since previsit or office visit. 

## 2017-12-19 NOTE — Progress Notes (Signed)
Report to RN, VSS, adequate respirations noted, no c/o pain or discomfort 

## 2017-12-20 ENCOUNTER — Telehealth: Payer: Self-pay | Admitting: *Deleted

## 2017-12-20 NOTE — Telephone Encounter (Signed)
  Follow up Call-  Call back number 12/19/2017  Post procedure Call Back phone  # [phone]  Permission to leave phone message Yes  Some recent data might be hidden     Patient questions:  Do you have a fever, pain , or abdominal swelling? No. Pain Score  0 *  Have you tolerated food without any problems? Yes.    Have you been able to return to your normal activities? Yes.    Do you have any questions about your discharge instructions: Diet   No. Medications  No. Follow up visit  No.  Do you have questions or concerns about your Care? No.  Actions: * If pain score is 4 or above: No action needed, pain <4.

## 2017-12-25 ENCOUNTER — Encounter: Payer: Self-pay | Admitting: Internal Medicine

## 2018-01-11 ENCOUNTER — Ambulatory Visit: Payer: Medicare HMO | Admitting: Podiatry

## 2018-02-14 DIAGNOSIS — R350 Frequency of micturition: Secondary | ICD-10-CM | POA: Diagnosis not present

## 2018-02-14 DIAGNOSIS — N898 Other specified noninflammatory disorders of vagina: Secondary | ICD-10-CM | POA: Diagnosis not present

## 2018-02-15 ENCOUNTER — Ambulatory Visit: Payer: Medicare HMO | Admitting: Podiatry

## 2018-02-21 DIAGNOSIS — K121 Other forms of stomatitis: Secondary | ICD-10-CM | POA: Diagnosis not present

## 2018-02-21 DIAGNOSIS — F17200 Nicotine dependence, unspecified, uncomplicated: Secondary | ICD-10-CM | POA: Diagnosis not present

## 2018-03-08 ENCOUNTER — Ambulatory Visit (INDEPENDENT_AMBULATORY_CARE_PROVIDER_SITE_OTHER): Payer: Self-pay | Admitting: Podiatry

## 2018-03-08 DIAGNOSIS — Z5329 Procedure and treatment not carried out because of patient's decision for other reasons: Secondary | ICD-10-CM

## 2018-03-22 ENCOUNTER — Encounter: Payer: Self-pay | Admitting: Podiatry

## 2018-03-22 ENCOUNTER — Ambulatory Visit: Payer: Medicare HMO | Admitting: Podiatry

## 2018-03-22 VITALS — BP 149/70 | HR 89 | Resp 16

## 2018-03-22 DIAGNOSIS — D689 Coagulation defect, unspecified: Secondary | ICD-10-CM | POA: Diagnosis not present

## 2018-03-22 DIAGNOSIS — B351 Tinea unguium: Secondary | ICD-10-CM | POA: Diagnosis not present

## 2018-04-03 DIAGNOSIS — F324 Major depressive disorder, single episode, in partial remission: Secondary | ICD-10-CM | POA: Diagnosis not present

## 2018-04-03 DIAGNOSIS — E119 Type 2 diabetes mellitus without complications: Secondary | ICD-10-CM | POA: Diagnosis not present

## 2018-04-03 DIAGNOSIS — E7800 Pure hypercholesterolemia, unspecified: Secondary | ICD-10-CM | POA: Diagnosis not present

## 2018-04-03 DIAGNOSIS — N183 Chronic kidney disease, stage 3 (moderate): Secondary | ICD-10-CM | POA: Diagnosis not present

## 2018-04-03 DIAGNOSIS — E039 Hypothyroidism, unspecified: Secondary | ICD-10-CM | POA: Diagnosis not present

## 2018-04-03 DIAGNOSIS — K219 Gastro-esophageal reflux disease without esophagitis: Secondary | ICD-10-CM | POA: Diagnosis not present

## 2018-04-03 DIAGNOSIS — N3281 Overactive bladder: Secondary | ICD-10-CM | POA: Diagnosis not present

## 2018-04-03 DIAGNOSIS — I10 Essential (primary) hypertension: Secondary | ICD-10-CM | POA: Diagnosis not present

## 2018-04-07 NOTE — Progress Notes (Signed)
Subjective:  Patient ID: Vicki Page, female    DOB: 1950/02/14,  MRN: [id number]  Chief Complaint  Patient presents with  . debride    BL nail trimming -pt still on Plavix    68 y.o. female presents with the above complaint. On plavix.  Review of Systems: Negative except as noted in the HPI. Denies N/V/F/Ch.  Past Medical History:  Diagnosis Date  . Adenomatous colon polyp   . 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 Anxiety   . Arthritis   . Atherosclerosis   . Barrett's esophagus   . Carotid artery occlusion   . DDD (degenerative disc disease), lumbar   . Diverticulosis   . DVT (deep venous thrombosis) ([place])   . Fall at home May 30, 2014   Pt slipped in tub- hurt left hip and right shoulder  . GERD (gastroesophageal reflux disease)   . H. pylori infection   . Hyperlipidemia   . Hypertension   . Hyperthyroidism   . IBS (irritable bowel syndrome)   . Osteoarthritis   . RLS (restless legs syndrome)   . Tubular adenoma of colon   . Ulcer of the stomach and intestine     Current Outpatient Medications:  .  acetaminophen (TYLENOL) 500 MG tablet, Take 1,000 mg by mouth 2 (two) times daily as needed for pain. , Disp: , Rfl:  .  aspirin EC 81 MG tablet, Take 81 mg by mouth daily., Disp: , Rfl:  .  buPROPion (WELLBUTRIN SR) 150 MG 12 hr tablet, Take 150 mg by mouth 2 (two) times daily., Disp: , Rfl:  .  Calcium-Vitamin D (CALTRATE 600 PLUS-VIT D PO), Take 1 tablet by mouth daily before supper. , Disp: , Rfl:  .  chlorhexidine (PERIDEX) 0.12 % solution, , Disp: , Rfl:  .  Cholecalciferol (VITAMIN D) 2000 UNITS CAPS, Take 2,000 Units by mouth daily before supper. , Disp: , Rfl:  .  clopidogrel (PLAVIX) 75 MG tablet, Take 1 tablet (75 mg total) by mouth daily with breakfast., Disp: 30 tablet, Rfl: 2 .  fluconazole (DIFLUCAN) 150 MG tablet, , Disp: , Rfl:  .  levothyroxine (SYNTHROID, LEVOTHROID) 25 MCG tablet, Take 25 mcg by mouth daily before breakfast. , Disp: , Rfl:  .   losartan-hydrochlorothiazide (HYZAAR) 100-25 MG per tablet, Take 1 tablet by mouth daily., Disp: , Rfl:  .  Magnesium 250 MG TABS, Take 250 mg by mouth daily before supper., Disp: , Rfl:  .  Multiple Vitamin (MULTIVITAMIN WITH MINERALS) TABS tablet, Take 1 tablet by mouth daily before supper. Centrum Silver , Disp: , Rfl:  .  pantoprazole (PROTONIX) 40 MG tablet, Take 40 mg by mouth daily., Disp: , Rfl:  .  potassium gluconate 595 MG TABS, Take 595 mg by mouth daily before supper. , Disp: , Rfl:  .  Probiotic Product (PROBIOTIC PO), Take 1 tablet by mouth daily after lunch. , Disp: , Rfl:  .  pyridOXINE (VITAMIN B-6) 100 MG tablet, Take 100 mg by mouth daily before supper. , Disp: , Rfl:  .  rOPINIRole (REQUIP) 1 MG tablet, Take 1 mg by mouth at bedtime., Disp: , Rfl: 0 .  simvastatin (ZOCOR) 40 MG tablet, Take 40 mg by mouth daily with supper. , Disp: , Rfl:  .  simvastatin (ZOCOR) 80 MG tablet, , Disp: , Rfl:  .  vitamin B-12 (CYANOCOBALAMIN) 500 MCG tablet, Take 500 mcg by mouth daily., Disp: , Rfl:   Social History  Tobacco Use  Smoking Status Light Tobacco Smoker  . Packs/day: 0.25  . Types: Cigarettes  Smokeless Tobacco Never Used  Tobacco Comment   7 or more depending on the day    Allergies  Allergen Reactions  . Codeine Nausea And Vomiting  . Darvon [Propoxyphene Hcl] Nausea And Vomiting  . Flagyl [Metronidazole] Nausea And Vomiting   Objective:   Vitals:   03/22/18 1533  BP: (!) 149/70  Pulse: 89  Resp: 16   There is no height or weight on file to calculate BMI. Constitutional Well developed. Well nourished.  Vascular Dorsalis pedis pulses palpable bilaterally. Posterior tibial pulses palpable bilaterally. Capillary refill normal to all digits.  No cyanosis or clubbing noted. Pedal hair growth normal.  Neurologic Normal speech. Oriented to person, place, and time. Epicritic sensation to light touch grossly present bilaterally.  Dermatologic Nails elongated  dystrophic pain to palpation No open wounds. No skin lesions.  Orthopedic: Normal joint ROM without pain or crepitus bilaterally. No visible deformities. No bony tenderness.   Radiographs: None Assessment:   1. Onychomycosis   2. 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  Onychomycosis with coagulation defect -Nails palliatively debridement as below -Educated on self-care  Procedure: Nail Debridement Rationale: coag defect Type of Debridement: manual, sharp debridement. Instrumentation: Nail nipper, rotary burr. Number of Nails: 10    No follow-ups on file.

## 2018-04-10 DIAGNOSIS — T783XXD Angioneurotic edema, subsequent encounter: Secondary | ICD-10-CM | POA: Diagnosis not present

## 2018-04-10 DIAGNOSIS — I10 Essential (primary) hypertension: Secondary | ICD-10-CM | POA: Diagnosis not present

## 2018-08-02 DIAGNOSIS — K121 Other forms of stomatitis: Secondary | ICD-10-CM | POA: Diagnosis not present

## 2018-08-13 DIAGNOSIS — G2581 Restless legs syndrome: Secondary | ICD-10-CM | POA: Diagnosis not present

## 2018-08-13 DIAGNOSIS — M1990 Unspecified osteoarthritis, unspecified site: Secondary | ICD-10-CM | POA: Diagnosis not present

## 2018-08-13 DIAGNOSIS — E1122 Type 2 diabetes mellitus with diabetic chronic kidney disease: Secondary | ICD-10-CM | POA: Diagnosis not present

## 2018-08-13 DIAGNOSIS — E1151 Type 2 diabetes mellitus with diabetic peripheral angiopathy without gangrene: Secondary | ICD-10-CM | POA: Diagnosis not present

## 2018-08-13 DIAGNOSIS — N183 Chronic kidney disease, stage 3 (moderate): Secondary | ICD-10-CM | POA: Diagnosis not present

## 2018-08-13 DIAGNOSIS — E039 Hypothyroidism, unspecified: Secondary | ICD-10-CM | POA: Diagnosis not present

## 2018-08-13 DIAGNOSIS — I10 Essential (primary) hypertension: Secondary | ICD-10-CM | POA: Diagnosis not present

## 2018-08-13 DIAGNOSIS — F324 Major depressive disorder, single episode, in partial remission: Secondary | ICD-10-CM | POA: Diagnosis not present

## 2018-08-13 DIAGNOSIS — Z0000 Encounter for general adult medical examination without abnormal findings: Secondary | ICD-10-CM | POA: Diagnosis not present

## 2018-08-13 DIAGNOSIS — E7800 Pure hypercholesterolemia, unspecified: Secondary | ICD-10-CM | POA: Diagnosis not present

## 2018-08-13 DIAGNOSIS — E119 Type 2 diabetes mellitus without complications: Secondary | ICD-10-CM | POA: Diagnosis not present

## 2018-08-30 DIAGNOSIS — N760 Acute vaginitis: Secondary | ICD-10-CM | POA: Diagnosis not present

## 2018-09-05 DIAGNOSIS — K2270 Barrett's esophagus without dysplasia: Secondary | ICD-10-CM | POA: Diagnosis not present

## 2018-09-05 DIAGNOSIS — I129 Hypertensive chronic kidney disease with stage 1 through stage 4 chronic kidney disease, or unspecified chronic kidney disease: Secondary | ICD-10-CM | POA: Diagnosis not present

## 2018-09-05 DIAGNOSIS — Z0000 Encounter for general adult medical examination without abnormal findings: Secondary | ICD-10-CM | POA: Diagnosis not present

## 2018-09-05 DIAGNOSIS — D689 Coagulation defect, unspecified: Secondary | ICD-10-CM | POA: Diagnosis not present

## 2018-09-05 DIAGNOSIS — F339 Major depressive disorder, recurrent, unspecified: Secondary | ICD-10-CM | POA: Diagnosis not present

## 2018-09-05 DIAGNOSIS — Z79899 Other long term (current) drug therapy: Secondary | ICD-10-CM | POA: Diagnosis not present

## 2018-09-05 DIAGNOSIS — E039 Hypothyroidism, unspecified: Secondary | ICD-10-CM | POA: Diagnosis not present

## 2018-09-05 DIAGNOSIS — Z008 Encounter for other general examination: Secondary | ICD-10-CM | POA: Diagnosis not present

## 2018-09-05 DIAGNOSIS — N183 Chronic kidney disease, stage 3 (moderate): Secondary | ICD-10-CM | POA: Diagnosis not present

## 2018-09-11 ENCOUNTER — Other Ambulatory Visit: Payer: Self-pay | Admitting: Family

## 2018-09-11 DIAGNOSIS — Z1231 Encounter for screening mammogram for malignant neoplasm of breast: Secondary | ICD-10-CM

## 2018-10-10 ENCOUNTER — Other Ambulatory Visit (HOSPITAL_COMMUNITY): Payer: Self-pay | Admitting: Interventional Radiology

## 2018-10-10 DIAGNOSIS — I729 Aneurysm of unspecified site: Secondary | ICD-10-CM

## 2018-10-26 ENCOUNTER — Ambulatory Visit (HOSPITAL_COMMUNITY): Payer: Medicare HMO

## 2018-10-26 ENCOUNTER — Encounter (HOSPITAL_COMMUNITY): Payer: Self-pay

## 2018-10-31 ENCOUNTER — Ambulatory Visit (HOSPITAL_COMMUNITY)
Admission: RE | Admit: 2018-10-31 | Discharge: 2018-10-31 | Disposition: A | Discharge status: Home / Self Care | Payer: Medicare HMO | Source: Ambulatory Visit | Attending: Interventional Radiology | Admitting: Interventional Radiology

## 2018-10-31 ENCOUNTER — Other Ambulatory Visit: Payer: Self-pay

## 2018-10-31 DIAGNOSIS — I729 Aneurysm of unspecified site: Secondary | ICD-10-CM

## 2018-10-31 DIAGNOSIS — I720 Aneurysm of carotid artery: Secondary | ICD-10-CM | POA: Insufficient documentation

## 2018-11-01 ENCOUNTER — Ambulatory Visit
Admission: RE | Admit: 2018-11-01 | Discharge: 2018-11-01 | Disposition: A | Discharge status: Home / Self Care | Payer: Medicare HMO | Source: Ambulatory Visit | Attending: Family | Admitting: Family

## 2018-11-01 DIAGNOSIS — Z1231 Encounter for screening mammogram for malignant neoplasm of breast: Secondary | ICD-10-CM

## 2018-11-07 ENCOUNTER — Telehealth (HOSPITAL_COMMUNITY): Payer: Self-pay

## 2018-11-07 NOTE — Telephone Encounter (Signed)
Called pt regarding recent mri. She is currently having frequent headaches. Per Dr. Estanislado Pandy, we will schedule pt for a diagnostic angiogram. Will send a request to insurance for authorization and call back to schedule. Pt agreed with plan. AW

## 2018-11-13 ENCOUNTER — Ambulatory Visit (HOSPITAL_COMMUNITY): Payer: BC Managed Care – PPO

## 2018-11-19 ENCOUNTER — Other Ambulatory Visit: Payer: Self-pay

## 2018-11-19 ENCOUNTER — Ambulatory Visit (INDEPENDENT_AMBULATORY_CARE_PROVIDER_SITE_OTHER): Payer: Medicare HMO | Admitting: Podiatry

## 2018-11-19 ENCOUNTER — Ambulatory Visit: Payer: BC Managed Care – PPO

## 2018-11-19 ENCOUNTER — Other Ambulatory Visit (HOSPITAL_COMMUNITY): Payer: Self-pay | Admitting: Interventional Radiology

## 2018-11-19 DIAGNOSIS — L989 Disorder of the skin and subcutaneous tissue, unspecified: Secondary | ICD-10-CM

## 2018-11-19 DIAGNOSIS — M79672 Pain in left foot: Secondary | ICD-10-CM

## 2018-11-19 DIAGNOSIS — I729 Aneurysm of unspecified site: Secondary | ICD-10-CM

## 2018-11-22 ENCOUNTER — Ambulatory Visit: Payer: Medicare HMO | Admitting: Podiatry

## 2018-11-22 NOTE — Progress Notes (Signed)
Pt returned call to Radiology RN station. PT states that she has not started taking Pletal. It was recommended by her physician but she has never taken it since the prescription was given.

## 2018-11-23 ENCOUNTER — Ambulatory Visit (HOSPITAL_COMMUNITY)
Admission: RE | Admit: 2018-11-23 | Discharge: 2018-11-23 | Disposition: A | Discharge status: Home / Self Care | Payer: Medicare HMO | Source: Ambulatory Visit | Attending: Interventional Radiology | Admitting: Interventional Radiology

## 2018-11-23 ENCOUNTER — Other Ambulatory Visit (HOSPITAL_COMMUNITY): Payer: Self-pay | Admitting: Interventional Radiology

## 2018-11-23 ENCOUNTER — Other Ambulatory Visit: Payer: Self-pay

## 2018-11-23 ENCOUNTER — Other Ambulatory Visit: Payer: Self-pay | Admitting: Physician Assistant

## 2018-11-23 DIAGNOSIS — G450 Vertebro-basilar artery syndrome: Secondary | ICD-10-CM | POA: Insufficient documentation

## 2018-11-23 DIAGNOSIS — E0590 Thyrotoxicosis, unspecified without thyrotoxic crisis or storm: Secondary | ICD-10-CM | POA: Insufficient documentation

## 2018-11-23 DIAGNOSIS — Z7902 Long term (current) use of antithrombotics/antiplatelets: Secondary | ICD-10-CM | POA: Diagnosis not present

## 2018-11-23 DIAGNOSIS — I10 Essential (primary) hypertension: Secondary | ICD-10-CM | POA: Insufficient documentation

## 2018-11-23 DIAGNOSIS — E785 Hyperlipidemia, unspecified: Secondary | ICD-10-CM | POA: Diagnosis not present

## 2018-11-23 DIAGNOSIS — K219 Gastro-esophageal reflux disease without esophagitis: Secondary | ICD-10-CM | POA: Diagnosis not present

## 2018-11-23 DIAGNOSIS — I729 Aneurysm of unspecified site: Secondary | ICD-10-CM

## 2018-11-23 DIAGNOSIS — M1990 Unspecified osteoarthritis, unspecified site: Secondary | ICD-10-CM | POA: Diagnosis not present

## 2018-11-23 DIAGNOSIS — Z79899 Other long term (current) drug therapy: Secondary | ICD-10-CM | POA: Diagnosis not present

## 2018-11-23 DIAGNOSIS — F419 Anxiety disorder, unspecified: Secondary | ICD-10-CM | POA: Diagnosis not present

## 2018-11-23 DIAGNOSIS — F17210 Nicotine dependence, cigarettes, uncomplicated: Secondary | ICD-10-CM | POA: Insufficient documentation

## 2018-11-23 DIAGNOSIS — G2581 Restless legs syndrome: Secondary | ICD-10-CM | POA: Insufficient documentation

## 2018-11-23 DIAGNOSIS — Z79890 Hormone replacement therapy (postmenopausal): Secondary | ICD-10-CM | POA: Insufficient documentation

## 2018-11-23 DIAGNOSIS — Z86718 Personal history of other venous thrombosis and embolism: Secondary | ICD-10-CM | POA: Diagnosis not present

## 2018-11-23 DIAGNOSIS — Z7982 Long term (current) use of aspirin: Secondary | ICD-10-CM | POA: Insufficient documentation

## 2018-11-23 HISTORY — PX: IR ANGIO VERTEBRAL SEL SUBCLAVIAN INNOMINATE UNI L MOD SED: IMG5364

## 2018-11-23 HISTORY — PX: IR ANGIO VERTEBRAL SEL VERTEBRAL UNI R MOD SED: IMG5368

## 2018-11-23 HISTORY — PX: IR US GUIDE VASC ACCESS RIGHT: IMG2390

## 2018-11-23 HISTORY — PX: IR ANGIO INTRA EXTRACRAN SEL COM CAROTID INNOMINATE BILAT MOD SED: IMG5360

## 2018-11-23 LAB — CBC
HCT: 43 % (ref 36.0–46.0)
Hemoglobin: 14.1 g/dL (ref 12.0–15.0)
MCH: 28.8 pg (ref 26.0–34.0)
MCHC: 32.8 g/dL (ref 30.0–36.0)
MCV: 87.8 fL (ref 80.0–100.0)
Platelets: 255 10*3/uL (ref 150–400)
RBC: 4.9 MIL/uL (ref 3.87–5.11)
RDW: 15.4 % (ref 11.5–15.5)
WBC: 5.2 10*3/uL (ref 4.0–10.5)
nRBC: 0 % (ref 0.0–0.2)

## 2018-11-23 LAB — BASIC METABOLIC PANEL
Anion gap: 7 (ref 5–15)
BUN: 13 mg/dL (ref 8–23)
CO2: 28 mmol/L (ref 22–32)
Calcium: 9.3 mg/dL (ref 8.9–10.3)
Chloride: 107 mmol/L (ref 98–111)
Creatinine, Ser: 0.95 mg/dL (ref 0.44–1.00)
GFR calc Af Amer: >60 mL/min (ref >60)
GFR calc non Af Amer: >60 mL/min (ref >60)
Glucose, Bld: 100 mg/dL — ABNORMAL HIGH (ref 70–99)
Potassium: 4.3 mmol/L (ref 3.5–5.1)
Sodium: 142 mmol/L (ref 135–145)

## 2018-11-23 LAB — PROTIME-INR
INR: 1.1 (ref 0.8–1.2)
Prothrombin Time: 13.6 seconds (ref 11.4–15.2)

## 2018-11-23 MED ORDER — LIDOCAINE HCL 1 % IJ SOLN
INTRAMUSCULAR | Status: AC
  Filled 2018-11-23: qty 20

## 2018-11-23 MED ORDER — NITROGLYCERIN 1 MG/10 ML FOR IR/CATH LAB
INTRA_ARTERIAL | Status: AC | PRN
  Administered 2018-11-23: 200 ug via INTRA_ARTERIAL

## 2018-11-23 MED ORDER — NITROGLYCERIN 1 MG/10 ML FOR IR/CATH LAB
INTRA_ARTERIAL | Status: AC
  Filled 2018-11-23: qty 10

## 2018-11-23 MED ORDER — FENTANYL CITRATE (PF) 100 MCG/2ML IJ SOLN
INTRAMUSCULAR | Status: AC
  Filled 2018-11-23: qty 2

## 2018-11-23 MED ORDER — VERAPAMIL HCL 2.5 MG/ML IV SOLN
INTRAVENOUS | Status: AC
  Filled 2018-11-23: qty 2

## 2018-11-23 MED ORDER — SODIUM CHLORIDE 0.9 % IV SOLN: INTRAVENOUS | Status: DC

## 2018-11-23 MED ORDER — LIDOCAINE HCL (PF) 1 % IJ SOLN
INTRAMUSCULAR | Status: AC | PRN
  Administered 2018-11-23: 8 mL

## 2018-11-23 MED ORDER — IOHEXOL 300 MG/ML  SOLN
150.0000 mL | Freq: Once | INTRAMUSCULAR | Status: AC | PRN
  Administered 2018-11-23: 80 mL via INTRA_ARTERIAL

## 2018-11-23 MED ORDER — VERAPAMIL HCL 2.5 MG/ML IV SOLN
INTRA_ARTERIAL | Status: AC | PRN
  Administered 2018-11-23: 13:00:00 via INTRA_ARTERIAL

## 2018-11-23 MED ORDER — MIDAZOLAM HCL 2 MG/2ML IJ SOLN
INTRAMUSCULAR | Status: AC | PRN
  Administered 2018-11-23: 1 mg via INTRAVENOUS

## 2018-11-23 MED ORDER — MIDAZOLAM HCL 2 MG/2ML IJ SOLN
INTRAMUSCULAR | Status: AC
  Filled 2018-11-23: qty 2

## 2018-11-23 MED ORDER — SODIUM CHLORIDE 0.9 % IV SOLN: INTRAVENOUS | Status: AC

## 2018-11-23 MED ORDER — HEPARIN SODIUM (PORCINE) 1000 UNIT/ML IJ SOLN
INTRAMUSCULAR | Status: AC
  Filled 2018-11-23: qty 1

## 2018-11-23 MED ORDER — FENTANYL CITRATE (PF) 100 MCG/2ML IJ SOLN
INTRAMUSCULAR | Status: AC | PRN
  Administered 2018-11-23: 25 ug via INTRAVENOUS

## 2018-11-23 NOTE — Addendum Note (Signed)
Addended by: Candiss Norse A on: 11/23/2018 07:51 AM   Modules accepted: Orders

## 2018-11-23 NOTE — H&P (Signed)
Chief Complaint: Patient was seen in consultation today for right ICA petrous segment stenosis/diagnostic cerebral arteriogram.  Referring Physician(s): Serafina Mitchell  Supervising Physician: Luanne Bras  Patient Status: [facility] - Out-pt  History of Present Illness: Vicki Page is a 68 y.o. female with a past medical history of hypertension, hyperlipidemia, DVT, GERD, Barrett's esophagus, H. Pylori, diverticulosis, hyperthyroidism, DDD, OA, restless leg syndrome, and anxiety. She is known to [facility] and has been followed by Dr. Estanislado Pandy since 11/2009. She first presented to our department at the request of Dr. Trula Slade for management of cerebral vascular disease. She underwent an image-guided diagnostic cerebral arteriogram 11/30/2009 by Dr. Estanislado Pandy which confirmed patient's areas of stenosis, in addition to presence of an intracranial aneurysm. 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 Estanislado Pandy. She has since been followed by routine imaging scans and diagnostic cerebral arteriograms to monitor for changes.  Diagnostic cerebral arteriogram 11/03/2015 (most recent): 1. 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 2. 60-65% stenosis of the left vertebrobasilar junction proximal to the left posterior-inferior cerebellar artery. 3. 70-75% stenosis of the right internal carotid artery petrous cavernous junction suggesting progression compared to the previous arteriogram. 4. Approximately 30% stenoses of the left vertebral artery at its origin. 5. Approximately 50% stenosis of the left internal carotid artery at the bulb by the NASCET criteria.  MRA head 10/31/2018: 1. No flow seen within the treated left ICA aneurysm. Stable artifact in the region of the left ICA stent with robust downstream signal suggesting no  underlying flow-limiting stenosis. 2. Chronic high-grade narrowing of the cavernous right ICA, similar prior examination. The MCA is isolated in the absence of a right A1 or posterior communicating artery. 3. Unchanged high-grade narrowing of the V4 left vertebral artery proximal to the PICA origin with mild poststenotic dilatation. 4. New from prior examination, MIP reconstructions demonstrate moderate segmental stenosis of the petrous right ICA. However, this may be artifactual as the source images do not show this degree of luminal narrowing.  Patient presents today for image-guided diagnostic cerebral arteriogram to accurately evaluate new found stenosis on MRA head 10/31/2018, as patient is symptomatic with frequent headaches at this time. Patient awake and alert sitting in bed. Complains of abdominal pain, stable. Complains of intermittent headaches, stable. Complains of blurred vision. Complains of intermittent dizziness, no syncope. Denies fever, chills, chest pain, dyspnea, weakness/numbness/tingling, or diplopia/curtain over eyes/complete blindness.  Currently taking Plavix 75 mg once daily and Aspirin 81 mg once daily.   Past Medical History:  Diagnosis Date  . Adenomatous colon polyp   . 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 Anxiety   . Arthritis   . Atherosclerosis   . Barrett's esophagus   . Carotid artery occlusion   . DDD (degenerative disc disease), lumbar   . Diverticulosis   . DVT (deep venous thrombosis) ([place])   . Fall at home May 30, 2014   Pt slipped in tub- hurt left hip and right shoulder  . GERD (gastroesophageal reflux disease)   . H. pylori infection   . Hyperlipidemia   . Hypertension   . Hyperthyroidism   . IBS (irritable bowel syndrome)   . Osteoarthritis   . RLS (restless legs syndrome)   . Tubular adenoma of colon   . Ulcer of the stomach and intestine     Past Surgical History:  Procedure Laterality Date  .  ANAL RECTAL MANOMETRY N/A 05/04/2016    Procedure: ANO RECTAL MANOMETRY;  Surgeon: Mauri Pole, MD;  Location: [facility] ENDOSCOPY;  Service: Endoscopy;  Laterality: N/A;  . ANEURYSM COILING  July 26, 2010   stent  . BRAIN SURGERY     Per patient " Left side behind eyes  . INSERTION OF ILIAC STENT  10/04/2012   Procedure: INSERTION OF ILIAC STENT;  Surgeon: Jettie Booze, MD;  Location: [facility] CATH LAB;  Service: Cardiovascular;;  Left External Iliac Artery  . IR GENERIC HISTORICAL  11/03/2015   IR ANGIO VERTEBRAL SEL VERTEBRAL UNI L MOD SED 11/03/2015 Luanne Bras, MD [facility]-INTERV RAD  . IR GENERIC HISTORICAL  11/03/2015   IR ANGIO INTRA EXTRACRAN SEL COM CAROTID INNOMINATE BILAT MOD SED 11/03/2015 Luanne Bras, MD [facility]INTER[facility]  . IR GENERIC HISTORICAL  11/03/2015   IR ANGIO VERTEBRAL SEL SUBCLAVIAN INNOMINATE UNI R MOD SED 11/03/2015 Luanne Bras, MD [facility]INTER[facility]  . IR GENERIC HISTORICAL  11/19/2015   IR RADIOLOGIST EVAL & MGMT 11/19/2015 [facility]INTER[facility] RAD  . LEG SURGERY    . LOWER EXTREMITY ANGIOGRAM Left 10-04-12   and Left iliac stent  . LOWER EXTREMITY ANGIOGRAM N/A 10/04/2012   Procedure: LOWER EXTREMITY ANGIOGRAM;  Surgeon: Jettie Booze, MD;  Location: [facility] CATH LAB;  Service: Cardiovascular;  Laterality: N/A;  . TUBAL LIGATION      Allergies: Codeine, Darvon [propoxyphene hcl], and Flagyl [metronidazole]  Medications: Prior to Admission medications   Medication Sig Start Date End Date Taking? Authorizing Provider  acetaminophen (TYLENOL) 500 MG tablet Take 1,000 mg by mouth 2 (two) times daily as needed for pain.    Yes [provider]  amLODipine (NORVASC) 10 MG tablet Take 10 mg by mouth daily. for high blood pressure 10/08/18  Yes [provider]  aspirin EC 81 MG tablet Take 81 mg by mouth daily.   Yes [provider]  buPROPion (WELLBUTRIN SR) 150 MG 12 hr tablet Take 150 mg by mouth 2 (two) times daily.   Yes [provider]  Calcium-Vitamin D (CALTRATE 600 PLUS-VIT  D PO) Take 1 tablet by mouth daily before supper.    Yes [provider]  Cholecalciferol (VITAMIN D) 2000 UNITS CAPS Take 2,000 Units by mouth daily before supper.    Yes [provider]  cilostazol (PLETAL) 50 MG tablet Take 50 mg by mouth 2 (two) times daily. 10/30/18  Yes [provider]  clopidogrel (PLAVIX) 75 MG tablet Take 1 tablet (75 mg total) by mouth daily with breakfast. 10/05/12  Yes Jettie Booze, MD  hydrochlorothiazide (MICROZIDE) 12.5 MG capsule Take 12.5 mg by mouth daily. 10/05/18  Yes [provider]  levothyroxine (SYNTHROID, LEVOTHROID) 25 MCG tablet Take 25 mcg by mouth daily before breakfast.    Yes [provider]  Magnesium 250 MG TABS Take 250 mg by mouth daily before supper.   Yes [provider]  Multiple Vitamin (MULTIVITAMIN WITH MINERALS) TABS tablet Take 1 tablet by mouth daily before supper. Centrum Silver    Yes [provider]  pantoprazole (PROTONIX) 40 MG tablet Take 40 mg by mouth daily.   Yes [provider]  potassium gluconate 595 MG TABS Take 595 mg by mouth daily before supper.    Yes [provider]  Probiotic Product (PROBIOTIC PO) Take 1 tablet by mouth daily after lunch.    Yes [provider]  Psyllium (METAMUCIL) 28.3 % POWD Take 1 Scoop by mouth daily.  Yes [provider]  simvastatin (ZOCOR) 80 MG tablet Take 80 mg by mouth daily with supper.    Yes [provider]  vitamin B-12 (CYANOCOBALAMIN) 500 MCG tablet Take 500 mcg by mouth daily.   Yes [provider]  rOPINIRole (REQUIP) 1 MG tablet Take 1 mg by mouth at bedtime. 08/25/15   [provider]     Family History  Problem Relation Age of Onset  . Deep vein thrombosis Mother   . Diabetes Mother        Amputation  . Heart disease Mother        Heart Disease before age 44  . Hyperlipidemia Mother   . Hypertension Mother   . Heart attack Mother   . Stroke  Mother   . Thyroid disease Mother   . Heart disease Father        Heart Disease before age 48  . Hypertension Father   . Heart attack Father   . Hyperlipidemia Father   . Heart disease Brother        Heart Disease before age 6  . Hyperlipidemia Brother   . Hypertension Brother   . Heart attack Brother   . COPD Brother   . Diabetes Brother   . Colonic polyp Brother   . Colon cancer Neg Hx   . Stomach cancer Neg Hx     Social History   Socioeconomic History  . Marital status: Single    Spouse name: Not on file  . Number of children: 2  . Years of education: 62  . Highest education level: Not on file  Occupational History  . Occupation: Retired  Scientific laboratory technician  . Financial resource strain: Not on file  . Food insecurity    Worry: Not on file    Inability: Not on file  . Transportation needs    Medical: Not on file    Non-medical: Not on file  Tobacco Use  . Smoking status: Light Tobacco Smoker    Packs/day: 0.25    Types: Cigarettes  . Smokeless tobacco: Never Used  . Tobacco comment: 7 or more depending on the day  Substance and Sexual Activity  . Alcohol use: No    Alcohol/week: 0.0 standard drinks  . Drug use: No  . Sexual activity: Never  Lifestyle  . Physical activity    Days per week: Not on file    Minutes per session: Not on file  . Stress: Not on file  Relationships  . Social Herbalist on phone: Not on file    Gets together: Not on file    Attends religious service: Not on file    Active member of club or organization: Not on file    Attends meetings of clubs or organizations: Not on file    Relationship status: Not on file  Other Topics Concern  . Not on file  Social History Narrative   Lives alone   Caffeine use: none   Right handed     Review of Systems: A 12 point ROS discussed and pertinent positives are indicated in the HPI above.  All other systems are negative.  Review of Systems  Constitutional: Negative for chills and  fever.  Eyes: Positive for visual disturbance.  Respiratory: Negative for shortness of breath and wheezing.   Cardiovascular: Negative for chest pain and palpitations.  Gastrointestinal: Positive for abdominal pain.  Neurological: Positive for dizziness and headaches. Negative for syncope, weakness and numbness.  Psychiatric/Behavioral: Negative  for behavioral problems and confusion.    Vital Signs: BP (!) 143/93   Pulse 78   Temp 97.7 F (36.5 C)   Resp 16   Ht 5\' 1"  (1.549 m)   Wt 148 lb (67.1 kg)   SpO2 100%   BMI 27.96 kg/m   Physical Exam Vitals signs and nursing note reviewed.  Constitutional:      General: She is not in acute distress.    Appearance: Normal appearance.  Cardiovascular:     Rate and Rhythm: Normal rate and regular rhythm.     Heart sounds: Normal heart sounds. No murmur.  Pulmonary:     Effort: Pulmonary effort is normal. No respiratory distress.     Breath sounds: Normal breath sounds. No wheezing.  Skin:    General: Skin is warm and dry.  Neurological:     Mental Status: She is alert and oriented to person, place, and time.  Psychiatric:        Mood and Affect: Mood normal.        Behavior: Behavior normal.        Thought Content: Thought content normal.        Judgment: Judgment normal.      MD Evaluation Airway: WNL Heart: WNL Abdomen: WNL Chest/ Lungs: WNL ASA  Classification: 3 Mallampati/Airway Score: Two   Imaging: Mr Angio Head Wo Contrast  Result Date: 10/31/2018 CLINICAL DATA:  Aneurysm. Additional history provided: Follow-up aneurysm coiling. EXAM: MRA HEAD WITHOUT CONTRAST TECHNIQUE: Angiographic images of the Circle of Willis were obtained using MRA technique without intravenous contrast. COMPARISON:  MRI brain/MRA head 01/02/2017, MRI/MRA head 05/24/2016, FINDINGS: New from prior examination, MIP reconstructions demonstrate a moderate segmental stenosis of the petrous right ICA. However, this may be artifactual as the  source images do not show this degree of luminal narrowing. A high-grade focal stenosis of the posterior genu/cavernous right ICA appears similar prior examination. No high-grade stenosis appreciated elsewhere within the intracranial right ICA. As before, there is poor flow related signal arising from the stented portion of the left ICA. No flow is seen within the treated left ICA aneurysm. The right middle cerebral artery is patent without high-grade proximal stenosis. Redemonstrated aplastic A1 right ACA. The left middle and anterior cerebral arteries are patent without significant proximal stenosis. The intracranial vertebral arteries are patent. High-grade narrowing of the V4 left vertebral artery proximal to the origin of the left PICA with poststenotic dilatation, similar prior examination. Mild narrowing of the mid basilar artery. Posterior cerebral arteries are patent without significant proximal stenosis. IMPRESSION: 1. No flow seen within the treated left ICA aneurysm. Stable artifact in the region of the left ICA stent with robust downstream signal suggesting no underlying flow-limiting stenosis. 2. Chronic high-grade narrowing of the cavernous right ICA, similar prior examination. The MCA is isolated in the absence of a right A1 or posterior communicating artery. 3. Unchanged high-grade narrowing of the V4 left vertebral artery proximal to the PICA origin with mild poststenotic dilatation. 4. New from prior examination, MIP reconstructions demonstrate moderate segmental stenosis of the petrous right ICA. However, this may be artifactual as the source images do not show this degree of luminal narrowing. Electronically Signed   By: Kellie Simmering   On: 10/31/2018 14:46   Mm 3d Screen Breast Bilateral  Result Date: 11/01/2018 CLINICAL DATA:  Screening. EXAM: DIGITAL SCREENING BILATERAL MAMMOGRAM WITH TOMO AND CAD COMPARISON:  Previous exam(s). ACR Breast Density Category c: The breast tissue is  heterogeneously dense, which may obscure small masses. FINDINGS: There are no findings suspicious for malignancy. Images were processed with CAD. IMPRESSION: No mammographic evidence of malignancy. A result letter of this screening mammogram will be mailed directly to the patient. RECOMMENDATION: Screening mammogram in one year. (Code:SM-B-01Y) BI-RADS CATEGORY  1: Negative. Electronically Signed   By: Abelardo Diesel M.D.   On: 11/01/2018 18:11    Labs:  CBC: Recent Labs    11/23/18 1036  WBC 5.2  HGB 14.1  HCT 43.0  PLT 255    COAGS: Recent Labs    11/23/18 1036  INR 1.1    BMP: Recent Labs    11/23/18 1036  NA 142  K 4.3  CL 107  CO2 28  GLUCOSE 100*  BUN 13  CALCIUM 9.3  CREATININE 0.95  GFRNONAA >60  GFRAA >60     Assessment and Plan:  Right ICA superior hypophyseal segment aneurysm s/p embolization using stent assisted coiling 02/11/2010 by Dr. Estanislado Pandy. Right ICA petrous segment stenosis, seen on MRA head 10/31/2018. Plan for image-guided diagnostic cerebral arteriogram this AM by Dr. Estanislado Pandy. Patient is NPO. Afebrile and WBCs WNL. Ok to proceed with Plavix/Aspirin use per Dr. Estanislado Pandy. INR 1.1 today.  Risks and benefits of cerebral arteriogram were discussed with the patient including, but not limited to bleeding, infection, vascular injury, stroke, or contrast induced renal failure. This interventional procedure involves the use of X-rays and because of the nature of the planned procedure, it is possible that we will have prolonged use of X-ray fluoroscopy. Potential radiation risks to you include (but are not limited to) the following: - A slightly elevated risk for cancer  several years later in life. This risk is typically less than 0.5% percent. This risk is low in comparison to the normal incidence of human cancer, which is 33% for women and 50% for men according to the [place]. - Radiation induced injury can include skin redness,  resembling a rash, tissue breakdown / ulcers and hair loss (which can be temporary or permanent).  The likelihood of either of these occurring depends on the difficulty of the procedure and whether you are sensitive to radiation due to previous procedures, disease, or genetic conditions.  IF your procedure requires a prolonged use of radiation, you will be notified and given written instructions for further action.  It is your responsibility to monitor the irradiated area for the 2 weeks following the procedure and to notify your physician if you are concerned that you have suffered a radiation induced injury.   All of the patient's questions were answered, patient is agreeable to proceed. Consent signed and in chart.   Thank you for this interesting consult.  I greatly enjoyed meeting Vicki Page and look forward to participating in their care.  A copy of this report was sent to the requesting provider on this date.  Electronically Signed: Earley Abide, PA-C 11/23/2018, 12:00 PM   I spent a total of 40 Minutes in face to face in clinical consultation, greater than 50% of which was counseling/coordinating care for right ICA petrous segment stenosis/diagnostic cerebral arteriogram.

## 2018-11-23 NOTE — Procedures (Signed)
S/P 4 vessel cerebral artreriogram RT rad approach. Findings. 1.Approx 70 to 75 % stenosis of RT ICA pet cav junction. 3.Approx 70  To 75 % stenosis of dominant Lt VBJ prox to Lt PICA  S.Deveshwar MD

## 2018-11-23 NOTE — Discharge Instructions (Addendum)
Radial Site Care ° °This sheet gives you information about how to care for yourself after your procedure. Your health care provider may also give you more specific instructions. If you have problems or questions, contact your health care provider. °What can I expect after the procedure? °After the procedure, it is common to have: °· Bruising and tenderness at the catheter insertion area. °Follow these instructions at home: °Medicines °· Take over-the-counter and prescription medicines only as told by your health care provider. °Insertion site care °· Follow instructions from your health care provider about how to take care of your insertion site. Make sure you: °? Wash your hands with soap and water before you change your bandage (dressing). If soap and water are not available, use hand sanitizer. °? Change your dressing as told by your health care provider. °? Leave stitches (sutures), skin glue, or adhesive strips in place. These skin closures may need to stay in place for 2 weeks or longer. If adhesive strip edges start to loosen and curl up, you may trim the loose edges. Do not remove adhesive strips completely unless your health care provider tells you to do that. °· Check your insertion site every day for signs of infection. Check for: °? Redness, swelling, or pain. °? Fluid or blood. °? Pus or a bad smell. °? Warmth. °· Do not take baths, swim, or use a hot tub until your health care provider approves. °· You may shower 24-48 hours after the procedure, or as directed by your health care provider. °? Remove the dressing and gently wash the site with plain soap and water. °? Pat the area dry with a clean towel. °? Do not rub the site. That could cause bleeding. °· Do not apply powder or lotion to the site. °Activity ° °· For 24 hours after the procedure, or as directed by your health care provider: °? Do not flex or bend the affected arm. °? Do not push or pull heavy objects with the affected arm. °? Do not  drive yourself home from the hospital or clinic. You may drive 24 hours after the procedure unless your health care provider tells you not to. °? Do not operate machinery or power tools. °· Do not lift anything that is heavier than 10 lb (4.5 kg), or the limit that you are told, until your health care provider says that it is safe. °· Ask your health care provider when it is okay to: °? Return to work or school. °? Resume usual physical activities or sports. °? Resume sexual activity. °General instructions °· If the catheter site starts to bleed, raise your arm and put firm pressure on the site. If the bleeding does not stop, get help right away. This is a medical emergency. °· If you went home on the same day as your procedure, a responsible adult should be with you for the first 24 hours after you arrive home. °· Keep all follow-up visits as told by your health care provider. This is important. °Contact a health care provider if: °· You have a fever. °· You have redness, swelling, or yellow drainage around your insertion site. °Get help right away if: °· You have unusual pain at the radial site. °· The catheter insertion area swells very fast. °· The insertion area is bleeding, and the bleeding does not stop when you hold steady pressure on the area. °· Your arm or hand becomes pale, cool, tingly, or numb. °These symptoms may represent a serious problem   that is an emergency. Do not wait to see if the symptoms will go away. Get medical help right away. Call your local emergency services (911 in the [place] Do not drive yourself to the hospital. °Summary °· After the procedure, it is common to have bruising and tenderness at the site. °· Follow instructions from your health care provider about how to take care of your radial site wound. Check the wound every day for signs of infection. °· Do not lift anything that is heavier than 10 lb (4.5 kg), or the limit that you are told, until your health care provider says  that it is safe. °This information is not intended to replace advice given to you by your health care provider. Make sure you discuss any questions you have with your health care provider. °Document Released: 02/26/2010 Document Revised: 03/01/2017 Document Reviewed: 03/01/2017 °Elsevier Patient Education © 2020 [employer] °Cerebral Angiogram, Care After °This sheet gives you information about how to care for yourself after your procedure. Your health care provider may also give you more specific instructions. If you have problems or questions, contact your health care provider. °What can I expect after the procedure? °After your procedure, it is common to have: °· Bruising and tenderness at the catheter insertion site. °· A mild headache. °Follow these instructions at home: °Insertion site care ° °· Follow instructions from your health care provider about how to take care of the insertion site. Make sure you: °? Wash your hands with soap and water before you change your bandage (dressing). If soap and water are not available, use hand sanitizer. °? Change your dressing as told by your health care provider. °? Leave stitches (sutures), skin glue, or adhesive strips in place. These skin closures may need to stay in place for 2 weeks or longer. If adhesive strip edges start to loosen and curl up, you may trim the loose edges. Do not remove adhesive strips completely unless your health care provider tells you to do that. °· Do not apply powder or lotion to the site. °· Do not take baths, swim, or use a hot tub until your health care provider says it is okay to do so. °· You may shower 24-48 hours after the procedure or as told by your health care provider. °? Gently wash the site with plain soap and water. °? Pat the area dry with a clean towel. °? Do not rub the site. This may cause bleeding. °· Check your incision area every day for signs of infection. Check for: °? Redness, swelling, or pain. °? Fluid or  blood. °? Warmth. °? Pus or a bad smell. °Activity °· Rest as told by your health care provider. °· Do not lift anything that is heavier than 10 lb (4.5 kg), or the limit that your health care provider tells you, until he or she says that it is safe. °· Do not drive for 24 hours if you were given a medicine to help you relax (sedative). °· Do not drive or use heavy machinery while taking prescription pain medicine. °General instructions ° °· Return to your normal activities as told by your health care provider. Ask your health care provider what activities are safe for you. °· If the catheter site starts to bleed, lie flat and put pressure on the site. °· Drink enough fluid to keep your urine clear or pale yellow. This helps flush the contrast dye from your body. °· Take over-the-counter and prescription medicines only as told   by your health care provider. °· Keep all follow-up visits as directed by your health care provider. This is important. °Contact a health care provider if: °· You have a fever or chills. °· You have redness, swelling, or more pain around your insertion site. °· You have fluid or blood coming from your insertion site. °· The insertion site feels warm to the touch. °· You have pus or a bad smell coming from your insertion site. °· You have more bruising around the insertion site. °· Blood collects in the tissue around the catheter site (hematoma), and the hematoma may be painful to the touch. °Get help right away if: °· You have vision changes or loss of vision. °· The catheter insertion area swells very fast. °· You have numbness or weakness on one side of your body. °· You have trouble talking, or you have slurred speech or cannot speak (aphasia). °· You feel confused or have trouble remembering. °· You have severe pain at the catheter insertion area. °· The catheter insertion area is bleeding, and the bleeding does not stop after 30 minutes of holding steady pressure on the site. °· The arm  or leg where the catheter was inserted is numb, tingling, or cold. °· You have chest pain. °· You have trouble breathing. °· You have a rash. °· You have trouble using the arm or leg where the catheter was inserted. °These symptoms may represent a serious problem that is an emergency. Do not wait to see if the symptoms will go away. Get medical help right away. Call your local emergency services (911 in [place] Do not drive yourself to the hospital. °Summary °· After your procedure, it is common to have bruising and tenderness at the site where the catheter was inserted. °· If your catheter insertion site begins to bleed, put direct pressure on it until the bleeding stops. °· After your procedure, it is important to rest and drink plenty of fluids. °· Return to your normal activities as told by your health care provider. Ask your health care provider what activities are safe for you. °This information is not intended to replace advice given to you by your health care provider. Make sure you discuss any questions you have with your health care provider. °Document Released: 06/10/2013 Document Revised: 01/06/2017 Document Reviewed: 02/29/2016 °Elsevier Patient Education © 2020 [employer] °Moderate Conscious Sedation, Adult, Care After °These instructions provide you with information about caring for yourself after your procedure. Your health care provider may also give you more specific instructions. Your treatment has been planned according to current medical practices, but problems sometimes occur. Call your health care provider if you have any problems or questions after your procedure. °What can I expect after the procedure? °After your procedure, it is common: °· To feel sleepy for several hours. °· To feel clumsy and have poor balance for several hours. °· To have poor judgment for several hours. °· To vomit if you eat too soon. °Follow these instructions at home: °For at least 24 hours after the  procedure: ° °· Do not: °? Participate in activities where you could fall or become injured. °? Drive. °? Use heavy machinery. °? Drink alcohol. °? Take sleeping pills or medicines that cause drowsiness. °? Make important decisions or sign legal documents. °? Take care of children on your own. °· Rest. °Eating and drinking °· Follow the diet recommended by your health care provider. °· If you vomit: °? Drink water, juice, or soup when   you can drink without vomiting. °? Make sure you have little or no nausea before eating solid foods. °General instructions °· Have a responsible adult stay with you until you are awake and alert. °· Take over-the-counter and prescription medicines only as told by your health care provider. °· If you smoke, do not smoke without supervision. °· Keep all follow-up visits as told by your health care provider. This is important. °Contact a health care provider if: °· You keep feeling nauseous or you keep vomiting. °· You feel light-headed. °· You develop a rash. °· You have a fever. °Get help right away if: °· You have trouble breathing. °This information is not intended to replace advice given to you by your health care provider. Make sure you discuss any questions you have with your health care provider. °Document Released: 11/14/2012 Document Revised: 01/06/2017 Document Reviewed: 05/16/2015 °Elsevier Patient Education © 2020 [employer] ° °

## 2018-11-23 NOTE — Progress Notes (Signed)
   Subjective: 68 y.o. female presenting to the office today with a chief complaint of painful callus lesion noted to the sub-fourth MPJ of the right foot that appeared a few months ago. Walking and bearing weight increases the pain. She has not had any treatment for the symptoms. Patient is here for further evaluation and treatment.    Past Medical History:  Diagnosis Date  . Adenomatous colon polyp   . 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 Anxiety   . Arthritis   . Atherosclerosis   . Barrett's esophagus   . Carotid artery occlusion   . DDD (degenerative disc disease), lumbar   . Diverticulosis   . DVT (deep venous thrombosis) ([place])   . Fall at home May 30, 2014   Pt slipped in tub- hurt left hip and right shoulder  . GERD (gastroesophageal reflux disease)   . H. pylori infection   . Hyperlipidemia   . Hypertension   . Hyperthyroidism   . IBS (irritable bowel syndrome)   . Osteoarthritis   . RLS (restless legs syndrome)   . Tubular adenoma of colon   . Ulcer of the stomach and intestine      Objective:  Physical Exam General: Alert and oriented x3 in no acute distress  Dermatology: Hyperkeratotic lesion(s) present on the sub-fourth MPJ of the right foot. Pain on palpation with a central nucleated core noted. Skin is warm, dry and supple bilateral lower extremities. Negative for open lesions or macerations.  Vascular: Palpable pedal pulses bilaterally. No edema or erythema noted. Capillary refill within normal limits.  Neurological: Epicritic and protective threshold grossly intact bilaterally.   Musculoskeletal Exam: Pain on palpation at the keratotic lesion(s) noted. Range of motion within normal limits bilateral. Muscle strength 5/5 in all groups bilateral.  Assessment: 1. Porokeratosis right sub-fourth MPJ    Plan of Care:  1. Patient evaluated 2. Excisional debridement of keratoic lesion(s) using a chisel blade was performed without incident. Salinocaine applied.   3. Dressed area with light dressing. 4. Recommended OTC corn and callus remover.  5. Patient is to return to the clinic PRN.   Edrick Kins, DPM [facility]  Dr. Edrick Kins, [address]                                        [place], [place] [address]                Office [phone]  Fax [phone]

## 2018-11-26 ENCOUNTER — Other Ambulatory Visit (HOSPITAL_COMMUNITY): Payer: Self-pay | Admitting: Interventional Radiology

## 2018-11-26 DIAGNOSIS — I729 Aneurysm of unspecified site: Secondary | ICD-10-CM

## 2018-11-27 ENCOUNTER — Encounter (HOSPITAL_COMMUNITY): Payer: Self-pay | Admitting: Interventional Radiology

## 2018-11-27 ENCOUNTER — Ambulatory Visit (HOSPITAL_COMMUNITY)
Admission: RE | Admit: 2018-11-27 | Discharge: 2018-11-27 | Disposition: A | Discharge status: Home / Self Care | Payer: Medicare HMO | Source: Ambulatory Visit | Attending: Interventional Radiology | Admitting: Interventional Radiology

## 2018-11-27 ENCOUNTER — Other Ambulatory Visit: Payer: Self-pay

## 2018-11-27 DIAGNOSIS — I729 Aneurysm of unspecified site: Secondary | ICD-10-CM

## 2018-11-27 NOTE — Progress Notes (Signed)
Chief Complaint: Patient was seen in consultation today for left VBJ stenosis.  Referring Physician(s): Serafina Mitchell  Supervising Physician: Luanne Bras  Patient Status: [facility] - Out-pt  History of Present Illness: Vicki Page is a 68 y.o. female with a past medical history as below, with pertinent past medical history including hypertension, hyperlipidemia, DVT, GERD, Barrett's esophagus, hyperthyroidism, and anxiety. She is known to [facility] and has been followed by Dr. Estanislado Pandy since 11/2009. She first presented to our department at the request of Dr. Trula Slade for management of cerebral vascular disease. She underwent an image-guided diagnostic cerebral arteriogram 11/30/2009 by Dr. Estanislado Pandy which confirmed patient's areas of stenosis, in addition to presence of an intracranial aneurysm. 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 Estanislado Pandy. She has since been followed by routine imaging scans and diagnostic cerebral arteriograms to monitor for changes. She most recently underwent an image-guided diagnostic cerebral arteriogram 11/23/2018 by Dr. Estanislado Pandy.  Diagnostic cerebral arteriogram 11/23/2018: 1. Approximately 85% stenosis of the dominant left vertebrobasilar junction on the lateral projection, with 65% stenosis on the AP projection secondary to a laterally positioned smooth atherosclerotic plaque. 2. Approximately 50% stenosis of the right internal carotid artery at the petrous cavernous junction region. 3. Approximately 30% stenosis of the left internal carotid artery at the bulb. 4. Approximately 40-50% stenosis of the cavernous segment of the left internal carotid artery. 5. 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  Patient presents  today for follow-up to discuss findings of recent diagnostic cerebral arteriogram 11/23/2018. Patient awake and alert sitting in chair. Complains of intermittent headaches, stable since diagnostic cerebral arteriogram 11/23/2018. Complains of intermittent dizziness, stable since diagnostic cerebral arteriogram 11/23/2018. Denies syncope associated with dizziness. Complains of intermittent blurred vision x minutes, states it happened last evening and this AM also. Denies weakness, numbness/tingling, diplopia/curtain over eyes/complete blindness, hearing changes, tinnitus, or speech difficulty.  Currently taking Plavix 75 mg once daily and Aspirin 81 mg once daily.   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 pylori infection    Hyperlipidemia    Hypertension    Hyperthyroidism    IBS (irritable bowel syndrome)    Osteoarthritis    RLS (restless legs syndrome)    Tubular adenoma of colon    Ulcer of the stomach and intestine     Past Surgical History:  Procedure Laterality Date   ANAL RECTAL MANOMETRY N/A 05/04/2016   Procedure: ANO RECTAL MANOMETRY;  Surgeon: Mauri Pole, MD;  Location: [facility];  Service: Endoscopy;  Laterality: N/A;   ANEURYSM COILING  July 26, 2010   stent   BRAIN SURGERY     Per patient " Left side behind eyes   INSERTION OF ILIAC STENT  10/04/2012   Procedure: INSERTION OF ILIAC STENT;  Surgeon: Jettie Booze, MD;  Location: [facility] CATH  LAB;  Service: Cardiovascular;;  Left External Iliac Artery   IR ANGIO INTRA EXTRACRAN SEL COM CAROTID  INNOMINATE BILAT MOD SED  11/23/2018   IR ANGIO VERTEBRAL SEL SUBCLAVIAN INNOMINATE UNI L MOD SED  11/23/2018   IR ANGIO VERTEBRAL SEL VERTEBRAL UNI R MOD SED  11/23/2018   IR GENERIC HISTORICAL  11/03/2015   IR ANGIO VERTEBRAL SEL VERTEBRAL UNI L MOD SED 11/03/2015 Luanne Bras, MD [facility]INTER[facility] RAD   IR GENERIC HISTORICAL  11/03/2015   IR ANGIO INTRA EXTRACRAN SEL COM CAROTID INNOMINATE BILAT MOD SED 11/03/2015 Luanne Bras, MD [facility]   IR GENERIC HISTORICAL  11/03/2015   IR ANGIO VERTEBRAL SEL SUBCLAVIAN INNOMINATE UNI R MOD SED 11/03/2015 Luanne Bras, MD [facility]   IR GENERIC HISTORICAL  11/19/2015   IR RADIOLOGIST EVAL & MGMT 11/19/2015 [facility]   IR US GUIDE VASC ACCESS RIGHT  11/23/2018   LEG SURGERY     LOWER EXTREMITY ANGIOGRAM Left 10-04-12   and Left iliac stent   LOWER EXTREMITY ANGIOGRAM N/A 10/04/2012   Procedure: LOWER EXTREMITY ANGIOGRAM;  Surgeon: Jettie Booze, MD;  Location: [facility] CATH LAB;  Service: Cardiovascular;  Laterality: N/A;   TUBAL LIGATION      Allergies: Codeine, Darvon [propoxyphene hcl], and Flagyl [metronidazole]  Medications: Prior to Admission medications   Medication Sig Start Date End Date Taking? Authorizing Provider  acetaminophen (TYLENOL) 500 MG tablet Take 1,000 mg by mouth 2 (two) times daily as needed for pain.     [provider]  amLODipine (NORVASC) 10 MG tablet Take 10 mg by mouth daily. for high blood pressure 10/08/18   [provider]  aspirin EC 81 MG tablet Take 81 mg by mouth daily.    [provider]  buPROPion (WELLBUTRIN SR) 150 MG 12 hr tablet Take 150 mg by mouth 2 (two) times daily.    [provider]  Calcium-Vitamin D (CALTRATE 600 PLUS-VIT D PO) Take 1 tablet by mouth daily before supper.     [provider]  Cholecalciferol (VITAMIN D) 2000 UNITS CAPS Take 2,000 Units by mouth daily before supper.     [provider]  cilostazol  (PLETAL) 50 MG tablet Take 50 mg by mouth 2 (two) times daily. 10/30/18   [provider]  clopidogrel (PLAVIX) 75 MG tablet Take 1 tablet (75 mg total) by mouth daily with breakfast. 10/05/12   Jettie Booze, MD  hydrochlorothiazide (MICROZIDE) 12.5 MG capsule Take 12.5 mg by mouth daily. 10/05/18   [provider]  levothyroxine (SYNTHROID, LEVOTHROID) 25 MCG tablet Take 25 mcg by mouth daily before breakfast.     [provider]  Magnesium 250 MG TABS Take 250 mg by mouth daily before supper.    [provider]  Multiple Vitamin (MULTIVITAMIN WITH MINERALS) TABS tablet Take 1 tablet by mouth daily before supper. Centrum Silver     [provider]  pantoprazole (PROTONIX) 40 MG tablet Take 40 mg by mouth daily.    [provider]  potassium gluconate 595 MG TABS Take 595 mg by mouth daily before supper.     [provider]  Probiotic Product (PROBIOTIC PO) Take 1 tablet by mouth daily after lunch.     [provider]  Psyllium (METAMUCIL) 28.3 % POWD Take 1 Scoop by mouth daily.    [provider]  rOPINIRole (REQUIP) 1 MG tablet Take 1 mg by mouth at bedtime. 08/25/15   [provider]  simvastatin (  ZOCOR) 80 MG tablet Take 80 mg by mouth daily with supper.     [provider]  vitamin B-12 (CYANOCOBALAMIN) 500 MCG tablet Take 500 mcg by mouth daily.    [provider]     Family History  Problem Relation Age of Onset   Deep vein thrombosis Mother    Diabetes Mother        Amputation   Heart disease Mother        Heart Disease before age 27   Hyperlipidemia Mother    Hypertension Mother    Heart attack Mother    Stroke Mother    Thyroid disease Mother    Heart disease Father        Heart Disease before age 23   Hypertension Father    Heart attack Father    Hyperlipidemia Father    Heart disease Brother        Heart Disease before age 63   Hyperlipidemia  Brother    Hypertension Brother    Heart attack Brother    COPD Brother    Diabetes Brother    Colonic polyp Brother    Colon cancer Neg Hx    Stomach cancer Neg Hx     Social History   Socioeconomic History   Marital status: Single    Spouse name: Not on file   Number of children: 2   Years of education: 16   Highest education level: Not on file  Occupational History   Occupation: Retired  Scientist, product/process development strain: Not on file   Food insecurity    Worry: Not on file    Inability: Not on Lexicographer needs    Medical: Not on file    Non-medical: Not on file  Tobacco Use   Smoking status: Light Tobacco Smoker    Packs/day: 0.25    Types: Cigarettes   Smokeless tobacco: Never Used   Tobacco comment: 7 or more depending on the day  Substance and Sexual Activity   Alcohol use: No    Alcohol/week: 0.0 standard drinks   Drug use: No   Sexual activity: Never  Lifestyle   Physical activity    Days per week: Not on file    Minutes per session: Not on file   Stress: Not on file  Relationships   Social connections    Talks on phone: Not on file    Gets together: Not on file    Attends religious service: Not on file    Active member of club or organization: Not on file    Attends meetings of clubs or organizations: Not on file    Relationship status: Not on file  Other Topics Concern   Not on file  Social History Narrative   Lives alone   Caffeine use: none   Right handed     Review of Systems: A 12 point ROS discussed and pertinent positives are indicated in the HPI above.  All other systems are negative.  Review of Systems  Constitutional: Negative for chills and fever.  HENT: Negative for hearing loss and tinnitus.   Eyes: Positive for visual disturbance.  Respiratory: Negative for shortness of breath and wheezing.   Cardiovascular: Negative for chest pain and palpitations.  Neurological: Positive for  dizziness and headaches. Negative for syncope, speech difficulty, weakness and numbness.  Psychiatric/Behavioral: Negative for behavioral problems and confusion.    Physical Exam Constitutional:      General: She is not  in acute distress.    Appearance: Normal appearance.  Pulmonary:     Effort: Pulmonary effort is normal. No respiratory distress.  Skin:    General: Skin is warm.  Neurological:     Mental Status: She is alert and oriented to person, place, and time.  Psychiatric:        Mood and Affect: Mood normal.        Behavior: Behavior normal.        Thought Content: Thought content normal.        Judgment: Judgment normal.      Imaging: Mr Angio Head Wo Contrast  Result Date: 10/31/2018 CLINICAL DATA:  Aneurysm. Additional history provided: Follow-up aneurysm coiling. EXAM: MRA HEAD WITHOUT CONTRAST TECHNIQUE: Angiographic images of the Circle of Willis were obtained using MRA technique without intravenous contrast. COMPARISON:  MRI brain/MRA head 01/02/2017, MRI/MRA head 05/24/2016, FINDINGS: New from prior examination, MIP reconstructions demonstrate a moderate segmental stenosis of the petrous right ICA. However, this may be artifactual as the source images do not show this degree of luminal narrowing. A high-grade focal stenosis of the posterior genu/cavernous right ICA appears similar prior examination. No high-grade stenosis appreciated elsewhere within the intracranial right ICA. As before, there is poor flow related signal arising from the stented portion of the left ICA. No flow is seen within the treated left ICA aneurysm. The right middle cerebral artery is patent without high-grade proximal stenosis. Redemonstrated aplastic A1 right ACA. The left middle and anterior cerebral arteries are patent without significant proximal stenosis. The intracranial vertebral arteries are patent. High-grade narrowing of the V4 left vertebral artery proximal to the origin of the left PICA  with poststenotic dilatation, similar prior examination. Mild narrowing of the mid basilar artery. Posterior cerebral arteries are patent without significant proximal stenosis. IMPRESSION: 1. No flow seen within the treated left ICA aneurysm. Stable artifact in the region of the left ICA stent with robust downstream signal suggesting no underlying flow-limiting stenosis. 2. Chronic high-grade narrowing of the cavernous right ICA, similar prior examination. The MCA is isolated in the absence of a right A1 or posterior communicating artery. 3. Unchanged high-grade narrowing of the V4 left vertebral artery proximal to the PICA origin with mild poststenotic dilatation. 4. New from prior examination, MIP reconstructions demonstrate moderate segmental stenosis of the petrous right ICA. However, this may be artifactual as the source images do not show this degree of luminal narrowing. Electronically Signed   By: Kellie Simmering   On: 10/31/2018 14:46   Ir US Guide Vasc Access Right  Result Date: 11/27/2018 CLINICAL DATA:  Recurrence of vertebrobasilar ischemic symptoms of dizziness vertigo, and transient blindness and double vision as per patient and also her daughter. Duration of symptoms approximately 6 months. EXAM: BILATERAL COMMON CAROTID AND INNOMINATE ANGIOGRAPHY; IR ULTRASOUND GUIDANCE VASC ACCESS RIGHT; IR ANGIO VERTEBRAL SEL SUBCLAVIAN INNOMINATE UNI LEFT MOD SED; IR ANGIO VERTEBRAL SEL VERTEBRAL UNI RIGHT MOD SED COMPARISON:  MRA of the brain of September 23. MEDICATIONS: Heparin 2000 units IA. No antibiotic was administered within 1 hour of the procedure. ANESTHESIA/SEDATION: Versed 1 mg IV; Fentanyl 25 mcg IV Moderate Sedation Time:  49 minutes The patient was continuously monitored during the procedure by the interventional radiology nurse under my direct supervision. CONTRAST:  Isovue 300 approximately 60 mL. FLUOROSCOPY TIME:  Fluoroscopy Time: 17 minutes 24 seconds (965 mGy). COMPLICATIONS: None  immediate. TECHNIQUE: Informed written consent was obtained from the patient after a thorough discussion of the procedural  risks, benefits and alternatives. All questions were addressed. Maximal Sterile Barrier Technique was utilized including caps, mask, sterile gowns, sterile gloves, sterile drape, hand hygiene and skin antiseptic. A timeout was performed prior to the initiation of the procedure. The right forearm to the right wrist was prepped and draped in the usual sterile fashion. The right radial artery was then palpated and a dorsal palmar anastomosis was verified. Right radial artery was then identified with ultrasound and its morphology was then documented. Using ultrasound guidance, transradial access was then obtained with a micropuncture needle. Over a 0.018 inch micro guidewire, a 4/5 French radial sheath was then inserted without difficulty. The obturator and the micro guidewire were then removed. Good aspiration was obtained from the side port of the radial sheath. A cocktail of 2.5 mg of Versed, 200 mcg of nitroglycerin, and 2000 units of heparin were then injected through the radial sheath in diluted form without difficulty. A right radial arteriogram was then obtained. Over a 0.035 inch Roadrunner guidewire, a 5 [place] Simmons 2 diagnostic catheter was then advanced to the aortic arch region, and selectively positioned in the right vertebral artery, the right common carotid artery, the left common carotid artery and the left subclavian artery proximal to the orifice of the left vertebral artery. At the end of the procedure, the right radial puncture site was then closed with a right wrist band for hemostasis. The distal right radial pulse was verified to be present at the end of the procedure. FINDINGS: The origin the right vertebral artery is widely patent. There is mild to moderate tortuosity just distal to the origin of the right vertebral artery. More distally the vessel is seen to opacify to  the cranial skull base. Wide patency is seen of the right vertebrobasilar junction and the right posterior-inferior cerebellar artery. The opacified portion of the basilar artery, the superior cerebellar arteries and the anterior-inferior cerebellar arteries is seen into the capillary and venous phases. There is approximately 50% stenosis of the right posterior cerebral artery P1 segment and also of the left posterior cerebral artery distal P1 segment. Unopacified blood is seen in the basilar artery from the contralateral vertebral artery. The right common carotid arteriogram demonstrates the right external carotid artery and its major branches to be widely patent. The right internal carotid artery at the bulb to the cranial skull base demonstrates wide patency. There is a double U-shaped configuration of the mid right ICA without evidence of kinking. The petrous segment is widely patent. There is approximately 70% stenosis at the petrous cavernous junction. Distal to this the cavernous and the right internal carotid artery supraclinoid segments are widely patent. The right middle cerebral artery opacifies into the capillary and venous phases. There is no opacification of the right anterior cerebral A1 segment. The left common carotid arteriogram demonstrates the left external carotid artery and its major branches to be widely patent. The left internal carotid artery at the bulb has approximately 30% stenosis secondary to a smooth lateral soft plaque. No evidence of intraluminal filling defects or of ulcerations is seen. The vessel is seen to opacify to the cranial skull base. There is again approximately 40-50% stenosis of the cavernous segment of the left internal carotid artery. 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 The left middle cerebral artery opacifies into the capillary and venous  phases. The left anterior cerebral artery opacifies also into the capillary and venous phases. There  is prompt cross filling via the anterior communicating artery of the right anterior cerebral A2 segment and distally. The dominant left vertebral artery origin is widely patent. The vessel is seen to opacify to the cranial skull base. Just proximal to the left posterior-inferior cerebellar artery there is a severe focal narrowing of the left vertebrobasilar junction secondary to a smooth plaque with stenosis on the lateral projection of approximately 85%, and of approximately 65% on the AP projection. Distal to this the left posterior-inferior cerebellar artery and the left vertebrobasilar junction are widely patent. The opacified portion of the basilar artery, the posterior cerebral arteries, the superior cerebellar arteries and the anterior-inferior cerebellar arteries demonstrates patency into the capillary and venous phases. 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 Approximately 50% stenosis of the right internal carotid artery at the petrous cavernous junction region. Approximately 30% stenosis of the left internal carotid artery at the bulb. Approximately 40-50% stenosis of the cavernous segment of the left internal carotid artery. 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 PLAN: Angio findings were reviewed with the patient and the patient's daughter. Given patient's recurrent symptoms of vertebrobasilar ischemia over the past 6 months it was felt further management considerations of the dominant left vertebrobasilar junction severe stenosis, and possibly the right internal carotid artery petrous cavernous junction  stenosis needed to be discussed. Also the symptoms of vertebrobasilar ischemia over the past 6 months have recurred despite the patient taking aspirin, and Plavix with regular compliance. Patient advised to return to the clinic in the next few days regarding further discussion regarding management of these findings. Electronically Signed   By: Luanne Bras M.D.   On: 11/23/2018 16:31   Mm 3d Screen Breast Bilateral  Result Date: 11/01/2018 CLINICAL DATA:  Screening. EXAM: DIGITAL SCREENING BILATERAL MAMMOGRAM WITH TOMO AND CAD COMPARISON:  Previous exam(s). ACR Breast Density Category c: The breast tissue is heterogeneously dense, which may obscure small masses. FINDINGS: There are no findings suspicious for malignancy. Images were processed with CAD. IMPRESSION: No mammographic evidence of malignancy. A result letter of this screening mammogram will be mailed directly to the patient. RECOMMENDATION: Screening mammogram in one year. (Code:SM-B-01Y) BI-RADS CATEGORY  1: Negative. Electronically Signed   By: Abelardo Diesel M.D.   On: 11/01/2018 18:11   Ir Angio Intra Extracran Sel Com Carotid Innominate Bilat Mod Sed  Result Date: 11/27/2018 CLINICAL DATA:  Recurrence of vertebrobasilar ischemic symptoms of dizziness vertigo, and transient blindness and double vision as per patient and also her daughter. Duration of symptoms approximately 6 months. EXAM: BILATERAL COMMON CAROTID AND INNOMINATE ANGIOGRAPHY; IR ULTRASOUND GUIDANCE VASC ACCESS RIGHT; IR ANGIO VERTEBRAL SEL SUBCLAVIAN INNOMINATE UNI LEFT MOD SED; IR ANGIO VERTEBRAL SEL VERTEBRAL UNI RIGHT MOD SED COMPARISON:  MRA of the brain of September 23. MEDICATIONS: Heparin 2000 units IA. No antibiotic was administered within 1 hour of the procedure. ANESTHESIA/SEDATION: Versed 1 mg IV; Fentanyl 25 mcg IV Moderate Sedation Time:  49 minutes The patient was continuously monitored during the procedure by the interventional radiology nurse under my direct  supervision. CONTRAST:  Isovue 300 approximately 60 mL. FLUOROSCOPY TIME:  Fluoroscopy Time: 17 minutes 24 seconds (965 mGy). COMPLICATIONS: None immediate. TECHNIQUE: Informed written consent was obtained from the patient after a thorough discussion of the procedural risks,  benefits and alternatives. All questions were addressed. Maximal Sterile Barrier Technique was utilized including caps, mask, sterile gowns, sterile gloves, sterile drape, hand hygiene and skin antiseptic. A timeout was performed prior to the initiation of the procedure. The right forearm to the right wrist was prepped and draped in the usual sterile fashion. The right radial artery was then palpated and a dorsal palmar anastomosis was verified. Right radial artery was then identified with ultrasound and its morphology was then documented. Using ultrasound guidance, transradial access was then obtained with a micropuncture needle. Over a 0.018 inch micro guidewire, a 4/5 French radial sheath was then inserted without difficulty. The obturator and the micro guidewire were then removed. Good aspiration was obtained from the side port of the radial sheath. A cocktail of 2.5 mg of Versed, 200 mcg of nitroglycerin, and 2000 units of heparin were then injected through the radial sheath in diluted form without difficulty. A right radial arteriogram was then obtained. Over a 0.035 inch Roadrunner guidewire, a 5 [place] Simmons 2 diagnostic catheter was then advanced to the aortic arch region, and selectively positioned in the right vertebral artery, the right common carotid artery, the left common carotid artery and the left subclavian artery proximal to the orifice of the left vertebral artery. At the end of the procedure, the right radial puncture site was then closed with a right wrist band for hemostasis. The distal right radial pulse was verified to be present at the end of the procedure. FINDINGS: The origin the right vertebral artery is widely  patent. There is mild to moderate tortuosity just distal to the origin of the right vertebral artery. More distally the vessel is seen to opacify to the cranial skull base. Wide patency is seen of the right vertebrobasilar junction and the right posterior-inferior cerebellar artery. The opacified portion of the basilar artery, the superior cerebellar arteries and the anterior-inferior cerebellar arteries is seen into the capillary and venous phases. There is approximately 50% stenosis of the right posterior cerebral artery P1 segment and also of the left posterior cerebral artery distal P1 segment. Unopacified blood is seen in the basilar artery from the contralateral vertebral artery. The right common carotid arteriogram demonstrates the right external carotid artery and its major branches to be widely patent. The right internal carotid artery at the bulb to the cranial skull base demonstrates wide patency. There is a double U-shaped configuration of the mid right ICA without evidence of kinking. The petrous segment is widely patent. There is approximately 70% stenosis at the petrous cavernous junction. Distal to this the cavernous and the right internal carotid artery supraclinoid segments are widely patent. The right middle cerebral artery opacifies into the capillary and venous phases. There is no opacification of the right anterior cerebral A1 segment. The left common carotid arteriogram demonstrates the left external carotid artery and its major branches to be widely patent. The left internal carotid artery at the bulb has approximately 30% stenosis secondary to a smooth lateral soft plaque. No evidence of intraluminal filling defects or of ulcerations is seen. The vessel is seen to opacify to the cranial skull base. There is again approximately 40-50% stenosis of the cavernous segment of the left internal carotid artery. 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 The left middle cerebral artery opacifies into the capillary and venous phases. The left anterior cerebral artery opacifies also into the capillary and venous phases. There is  prompt cross filling via the anterior communicating artery of the right anterior cerebral A2 segment and distally. The dominant left vertebral artery origin is widely patent. The vessel is seen to opacify to the cranial skull base. Just proximal to the left posterior-inferior cerebellar artery there is a severe focal narrowing of the left vertebrobasilar junction secondary to a smooth plaque with stenosis on the lateral projection of approximately 85%, and of approximately 65% on the AP projection. Distal to this the left posterior-inferior cerebellar artery and the left vertebrobasilar junction are widely patent. The opacified portion of the basilar artery, the posterior cerebral arteries, the superior cerebellar arteries and the anterior-inferior cerebellar arteries demonstrates patency into the capillary and venous phases. 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 Approximately 50% stenosis of the right internal carotid artery at the petrous cavernous junction region. Approximately 30% stenosis of the left internal carotid artery at the bulb. Approximately 40-50% stenosis of the cavernous segment of the left internal carotid artery. 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 PLAN: Angio findings were reviewed with the patient and the patient's daughter. Given patient's recurrent symptoms of vertebrobasilar ischemia over the past 6 months it was felt further management  considerations of the dominant left vertebrobasilar junction severe stenosis, and possibly the right internal carotid artery petrous cavernous junction stenosis needed to be discussed. Also the symptoms of vertebrobasilar ischemia over the past 6 months have recurred despite the patient taking aspirin, and Plavix with regular compliance. Patient advised to return to the clinic in the next few days regarding further discussion regarding management of these findings. Electronically Signed   By: Luanne Bras M.D.   On: 11/23/2018 16:31   Ir Angio Vertebral Sel Subclavian Innominate Uni L Mod Sed  Result Date: 11/27/2018 CLINICAL DATA:  Recurrence of vertebrobasilar ischemic symptoms of dizziness vertigo, and transient blindness and double vision as per patient and also her daughter. Duration of symptoms approximately 6 months. EXAM: BILATERAL COMMON CAROTID AND INNOMINATE ANGIOGRAPHY; IR ULTRASOUND GUIDANCE VASC ACCESS RIGHT; IR ANGIO VERTEBRAL SEL SUBCLAVIAN INNOMINATE UNI LEFT MOD SED; IR ANGIO VERTEBRAL SEL VERTEBRAL UNI RIGHT MOD SED COMPARISON:  MRA of the brain of September 23. MEDICATIONS: Heparin 2000 units IA. No antibiotic was administered within 1 hour of the procedure. ANESTHESIA/SEDATION: Versed 1 mg IV; Fentanyl 25 mcg IV Moderate Sedation Time:  49 minutes The patient was continuously monitored during the procedure by the interventional radiology nurse under my direct supervision. CONTRAST:  Isovue 300 approximately 60 mL. FLUOROSCOPY TIME:  Fluoroscopy Time: 17 minutes 24 seconds (965 mGy). COMPLICATIONS: None immediate. TECHNIQUE: Informed written consent was obtained from the patient after a thorough discussion of the procedural risks, benefits and alternatives. All questions were addressed. Maximal Sterile Barrier Technique was utilized including caps, mask, sterile gowns, sterile gloves, sterile drape, hand hygiene and skin antiseptic. A timeout was performed prior to the initiation of  the procedure. The right forearm to the right wrist was prepped and draped in the usual sterile fashion. The right radial artery was then palpated and a dorsal palmar anastomosis was verified. Right radial artery was then identified with ultrasound and its morphology was then documented. Using ultrasound guidance, transradial access was then obtained with a micropuncture needle. Over a 0.018 inch micro guidewire, a 4/5 [place] radial sheath  was then inserted without difficulty. The obturator and the micro guidewire were then removed. Good aspiration was obtained from the side port of the radial sheath. A cocktail of 2.5 mg of Versed, 200 mcg of nitroglycerin, and 2000 units of heparin were then injected through the radial sheath in diluted form without difficulty. A right radial arteriogram was then obtained. Over a 0.035 inch Roadrunner guidewire, a 5 [place] Simmons 2 diagnostic catheter was then advanced to the aortic arch region, and selectively positioned in the right vertebral artery, the right common carotid artery, the left common carotid artery and the left subclavian artery proximal to the orifice of the left vertebral artery. At the end of the procedure, the right radial puncture site was then closed with a right wrist band for hemostasis. The distal right radial pulse was verified to be present at the end of the procedure. FINDINGS: The origin the right vertebral artery is widely patent. There is mild to moderate tortuosity just distal to the origin of the right vertebral artery. More distally the vessel is seen to opacify to the cranial skull base. Wide patency is seen of the right vertebrobasilar junction and the right posterior-inferior cerebellar artery. The opacified portion of the basilar artery, the superior cerebellar arteries and the anterior-inferior cerebellar arteries is seen into the capillary and venous phases. There is approximately 50% stenosis of the right posterior cerebral artery P1  segment and also of the left posterior cerebral artery distal P1 segment. Unopacified blood is seen in the basilar artery from the contralateral vertebral artery. The right common carotid arteriogram demonstrates the right external carotid artery and its major branches to be widely patent. The right internal carotid artery at the bulb to the cranial skull base demonstrates wide patency. There is a double U-shaped configuration of the mid right ICA without evidence of kinking. The petrous segment is widely patent. There is approximately 70% stenosis at the petrous cavernous junction. Distal to this the cavernous and the right internal carotid artery supraclinoid segments are widely patent. The right middle cerebral artery opacifies into the capillary and venous phases. There is no opacification of the right anterior cerebral A1 segment. The left common carotid arteriogram demonstrates the left external carotid artery and its major branches to be widely patent. The left internal carotid artery at the bulb has approximately 30% stenosis secondary to a smooth lateral soft plaque. No evidence of intraluminal filling defects or of ulcerations is seen. The vessel is seen to opacify to the cranial skull base. There is again approximately 40-50% stenosis of the cavernous segment of the left internal carotid artery. 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 The left middle cerebral artery opacifies into the capillary and venous phases. The left anterior cerebral artery opacifies also into the capillary and venous phases. There is prompt cross filling via the anterior communicating artery of the right anterior cerebral A2 segment and distally. The dominant left vertebral artery origin is widely patent. The vessel is seen to opacify to the cranial skull base. Just proximal to the left posterior-inferior  cerebellar artery there is a severe focal narrowing of the left vertebrobasilar junction secondary to a smooth plaque with stenosis on the lateral projection of approximately 85%, and of approximately 65% on the AP projection. Distal to this the left posterior-inferior cerebellar artery and the left vertebrobasilar junction are widely patent. The opacified portion of the basilar artery, the posterior cerebral arteries,  the superior cerebellar arteries and the anterior-inferior cerebellar arteries demonstrates patency into the capillary and venous phases. 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 Approximately 50% stenosis of the right internal carotid artery at the petrous cavernous junction region. Approximately 30% stenosis of the left internal carotid artery at the bulb. Approximately 40-50% stenosis of the cavernous segment of the left internal carotid artery. 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 PLAN: Angio findings were reviewed with the patient and the patient's daughter. 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 Also the symptoms of vertebrobasilar ischemia over the past 6 months have recurred despite the patient taking aspirin, and Plavix with regular compliance. Patient advised to return to the clinic in the next few days regarding further discussion regarding management of these findings. Electronically Signed   By: Luanne Bras M.D.   On:  11/23/2018 16:31   Ir Angio Vertebral Sel Vertebral Uni R Mod Sed  Result Date: 11/27/2018 CLINICAL DATA:  Recurrence of vertebrobasilar ischemic symptoms of dizziness vertigo, and transient blindness and double vision as per patient and also her daughter. Duration of symptoms approximately 6 months. EXAM: BILATERAL COMMON CAROTID AND INNOMINATE ANGIOGRAPHY; IR ULTRASOUND GUIDANCE VASC ACCESS RIGHT; IR ANGIO VERTEBRAL SEL SUBCLAVIAN INNOMINATE UNI LEFT MOD SED; IR ANGIO VERTEBRAL SEL VERTEBRAL UNI RIGHT MOD SED COMPARISON:  MRA of the brain of September 23. MEDICATIONS: Heparin 2000 units IA. No antibiotic was administered within 1 hour of the procedure. ANESTHESIA/SEDATION: Versed 1 mg IV; Fentanyl 25 mcg IV Moderate Sedation Time:  49 minutes The patient was continuously monitored during the procedure by the interventional radiology nurse under my direct supervision. CONTRAST:  Isovue 300 approximately 60 mL. FLUOROSCOPY TIME:  Fluoroscopy Time: 17 minutes 24 seconds (965 mGy). COMPLICATIONS: None immediate. TECHNIQUE: Informed written consent was obtained from the patient after a thorough discussion of the procedural risks, benefits and alternatives. All questions were addressed. Maximal Sterile Barrier Technique was utilized including caps, mask, sterile gowns, sterile gloves, sterile drape, hand hygiene and skin antiseptic. A timeout was performed prior to the initiation of the procedure. The right forearm to the right wrist was prepped and draped in the usual sterile fashion. The right radial artery was then palpated and a dorsal palmar anastomosis was verified. Right radial artery was then identified with ultrasound and its morphology was then documented. Using ultrasound guidance, transradial access was then obtained with a micropuncture needle. Over a 0.018 inch micro guidewire, a 4/5 [place] radial sheath was then inserted without difficulty. The obturator and the micro guidewire were then removed.  Good aspiration was obtained from the side port of the radial sheath. A cocktail of 2.5 mg of Versed, 200 mcg of nitroglycerin, and 2000 units of heparin were then injected through the radial sheath in diluted form without difficulty. A right radial arteriogram was then obtained. Over a 0.035 inch Roadrunner guidewire, a 5 [place] Simmons 2 diagnostic catheter was then advanced to the aortic arch region, and selectively positioned in the right vertebral artery, the right common carotid artery, the left common carotid artery and the left subclavian artery proximal  to the orifice of the left vertebral artery. At the end of the procedure, the right radial puncture site was then closed with a right wrist band for hemostasis. The distal right radial pulse was verified to be present at the end of the procedure. FINDINGS: The origin the right vertebral artery is widely patent. There is mild to moderate tortuosity just distal to the origin of the right vertebral artery. More distally the vessel is seen to opacify to the cranial skull base. Wide patency is seen of the right vertebrobasilar junction and the right posterior-inferior cerebellar artery. The opacified portion of the basilar artery, the superior cerebellar arteries and the anterior-inferior cerebellar arteries is seen into the capillary and venous phases. There is approximately 50% stenosis of the right posterior cerebral artery P1 segment and also of the left posterior cerebral artery distal P1 segment. Unopacified blood is seen in the basilar artery from the contralateral vertebral artery. The right common carotid arteriogram demonstrates the right external carotid artery and its major branches to be widely patent. The right internal carotid artery at the bulb to the cranial skull base demonstrates wide patency. There is a double U-shaped configuration of the mid right ICA without evidence of kinking. The petrous segment is widely patent. There is approximately  70% stenosis at the petrous cavernous junction. Distal to this the cavernous and the right internal carotid artery supraclinoid segments are widely patent. The right middle cerebral artery opacifies into the capillary and venous phases. There is no opacification of the right anterior cerebral A1 segment. The left common carotid arteriogram demonstrates the left external carotid artery and its major branches to be widely patent. The left internal carotid artery at the bulb has approximately 30% stenosis secondary to a smooth lateral soft plaque. No evidence of intraluminal filling defects or of ulcerations is seen. The vessel is seen to opacify to the cranial skull base. There is again approximately 40-50% stenosis of the cavernous segment of the left internal carotid artery. 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 The left middle cerebral artery opacifies into the capillary and venous phases. The left anterior cerebral artery opacifies also into the capillary and venous phases. There is prompt cross filling via the anterior communicating artery of the right anterior cerebral A2 segment and distally. The dominant left vertebral artery origin is widely patent. The vessel is seen to opacify to the cranial skull base. Just proximal to the left posterior-inferior cerebellar artery there is a severe focal narrowing of the left vertebrobasilar junction secondary to a smooth plaque with stenosis on the lateral projection of approximately 85%, and of approximately 65% on the AP projection. Distal to this the left posterior-inferior cerebellar artery and the left vertebrobasilar junction are widely patent. The opacified portion of the basilar artery, the posterior cerebral arteries, the superior cerebellar arteries and the anterior-inferior cerebellar arteries demonstrates patency into the capillary and  venous phases. 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 Approximately 50% stenosis of the right internal carotid artery at the petrous cavernous junction region. Approximately 30% stenosis of the left internal carotid artery at the bulb. Approximately 40-50% stenosis of the cavernous segment of the left internal carotid artery. 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  PLAN: Angio findings were reviewed with the patient and the patient's daughter. 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 Also the symptoms of vertebrobasilar ischemia over the past 6 months have recurred despite the patient taking aspirin, and Plavix with regular compliance. Patient advised to return to the clinic in the next few days regarding further discussion regarding management of these findings. Electronically Signed   By: Luanne Bras M.D.   On: 11/23/2018 16:31    Labs:  CBC: Recent Labs    11/23/18 1036  WBC 5.2  HGB 14.1  HCT 43.0  PLT 255    COAGS: Recent Labs    11/23/18 1036  INR 1.1    BMP: Recent Labs    11/23/18 1036  NA 142  K 4.3  CL 107  CO2 28  GLUCOSE 100*  BUN 13  CALCIUM 9.3  CREATININE 0.95  GFRNONAA >60  GFRAA >60     Assessment and Plan:  Left VBJ stenosis. Dr. Estanislado Pandy was present for consultation. Discussed findings of recent diagnostic cerebral arteriogram 11/23/2018, specifically left VBJ stenosis. Discussed current  symptoms and explained that they could be related to her stenosis. Explained there are two management options moving forward- either continued conservative management including DAPT (Plavix and Aspirin) and routine imaging scans to monitor for changes or with a procedure called an image-guided cerebral arteriogram with possible revascularization (angioplasty/stent placement) of left VBJ stenosis. Explained procedure, including risks and benefits (including risk of stroke of 3-5%). At this time, it is recommended that patient undergo procedure, as she is symptomatic. Patient expresses desire to move forward with procedure.  Plan for follow-up with an image-guided cerebral arteriogram with possible revascularization (angioplasty/stent placement) of left VBJ stenosis. Informed patient that our schedulers will call her to set up this procedure. Instructed patient to continue taking Plavix 75 mg once daily and Aspirin 81 mg once daily.  All questions answered and concerns addressed. Patient conveys understanding and agrees with plan.  Thank you for this interesting consult.  I greatly enjoyed meeting Vicki Page and look forward to participating in their care.  A copy of this report was sent to the requesting provider on this date.  Electronically Signed: Earley Abide, PA-C 11/27/2018, 10:06 AM   I spent a total of 40 Minutes in face to face in clinical consultation, greater than 50% of which was counseling/coordinating care for left VBJ stenosis.

## 2018-11-30 ENCOUNTER — Other Ambulatory Visit (HOSPITAL_COMMUNITY): Payer: Self-pay | Admitting: Interventional Radiology

## 2018-11-30 DIAGNOSIS — I6502 Occlusion and stenosis of left vertebral artery: Secondary | ICD-10-CM

## 2018-11-30 DIAGNOSIS — I771 Stricture of artery: Secondary | ICD-10-CM

## 2018-12-07 ENCOUNTER — Encounter (HOSPITAL_COMMUNITY): Payer: Self-pay | Admitting: *Deleted

## 2018-12-07 ENCOUNTER — Other Ambulatory Visit: Payer: Self-pay

## 2018-12-07 ENCOUNTER — Other Ambulatory Visit (HOSPITAL_COMMUNITY)
Admission: RE | Admit: 2018-12-07 | Discharge: 2018-12-07 | Disposition: A | Discharge status: Home / Self Care | Payer: Medicare HMO | Source: Ambulatory Visit | Attending: Interventional Radiology | Admitting: Interventional Radiology

## 2018-12-07 ENCOUNTER — Other Ambulatory Visit: Payer: Self-pay | Admitting: Radiology

## 2018-12-07 DIAGNOSIS — Z01812 Encounter for preprocedural laboratory examination: Secondary | ICD-10-CM | POA: Insufficient documentation

## 2018-12-07 DIAGNOSIS — Z20828 Contact with and (suspected) exposure to other viral communicable diseases: Secondary | ICD-10-CM | POA: Diagnosis not present

## 2018-12-07 NOTE — Progress Notes (Signed)
Anesthesia Chart Review: Vicki Page   Case: [id number] Date/Time: 12/10/18 0815   Procedure: STENTING (N/A )   Anesthesia type: General   Pre-op diagnosis: STENOSIS   Location: [facility] OR RADIOLOGY ROOM / [place] [facility]   Surgeon: Vicki Bras, MD      DISCUSSION: Patient is a 68 year old female scheduled for IR procedure under anesthesia.  According to 11/27/2018 progress note by Vicki Abide, PA-C "Plan for follow-up with an image-guided cerebral arteriogram with possible revascularization (angioplasty/stent placement) of left VBJ stenosis. Informed patient that our schedulers will call her to set up this procedure. 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  She denied shortness of breath, cough, fever, chest pain per PAT RN phone interview.  She is a same-day work-up so further evaluation by her anesthesia team on the day of surgery.  She will need preoperative EKG and labs.  12/07/2018 previous procedure COVID-19 test is in process.   VS: Ht 5\' 1"  (1.549 m)   Wt 66.7 kg   LMP  (LMP Unknown)   BMI 27.78 kg/m  by last documentation. She is for VS/WT on arrival.    PROVIDERS: PCP is with [facility] Vicki Nephew, MD is vascular surgeon   LABS: Day of procedure per IR orders.    EKG: Last EKG seen was from 09/18/16, so she will need updated EKG on the day of surgery.    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  - Approximately 50% stenosis of the right internal carotid artery at the petrous cavernous junction region.  - Approximately 30% stenosis of the left internal carotid artery at the bulb.  - Approximately 40-50% stenosis of the cavernous segment of the left internal carotid artery.  - Previously endovascularly coiled left internal carotid artery superior hypophyseal region aneurysm remains completely obliterated with no evidence of coil compaction.  PLAN: - Given patient's recurrent symptoms of vertebrobasilar ischemia over the past 6 months despite taking aspirin and Plavix, it was felt further management considerations of the dominant left vertebrobasilar junction severe stenosis, and possibly the right internal carotid artery petrous cavernous junction stenosis needed to be discussed.  BLE Arterial Doppler/ABI 02/27/17: Final Interpretation: Right: Resting right ankle-brachial index is within normal range. No evidence of significant right lower extremity arterial disease. Left: Resting left ankle-brachial index is within normal range. No evidence of significant left lower extremity arterial disease.  Carotid US 04/14/15: Summary: There is no obvious evidence of hemodynamically significant internal carotid artery stenosis bilaterally. Vertebral arteries are patent with antegrade flow.   Past Medical History:  Diagnosis Date  . Adenomatous colon polyp   . Aneurysm ([place])    brain x 2  . Anxiety   . Arthritis   . Atherosclerosis   . Barrett's esophagus   . Carotid artery occlusion   . Cataracts, bilateral    just watching  . DDD (degenerative disc disease), lumbar   . Depression   . Diverticulosis   . DVT (deep  venous thrombosis) ([facility])    left leg  . Fall at home May 30, 2014   Pt slipped in tub- hurt left hip and right shoulder  . GERD (gastroesophageal reflux disease)   . H. pylori  infection   . Hyperlipidemia   . Hypertension   . Hypothyroidism   . IBS (irritable bowel syndrome)   . Osteoarthritis   . Pre-diabetes    no meds, diet controlled  . RLS (restless legs syndrome)   . Tubular adenoma of colon   . Ulcer of the stomach and intestine     Past Surgical History:  Procedure Laterality Date  . ANAL RECTAL MANOMETRY N/A 05/04/2016   Procedure: ANO RECTAL MANOMETRY;  Surgeon: Vicki Pole, MD;  Location: [facility] ENDOSCOPY;  Service: Endoscopy;  Laterality: N/A;  . ANEURYSM COILING  July 26, 2010   stent  . BRAIN SURGERY     Per patient " Left side behind eyes  . INSERTION OF ILIAC STENT  10/04/2012   Procedure: INSERTION OF ILIAC STENT;  Surgeon: Vicki Booze, MD;  Location: [facility] CATH LAB;  Service: Cardiovascular;;  Left External Iliac Artery  . IR ANGIO INTRA EXTRACRAN SEL COM CAROTID INNOMINATE BILAT MOD SED  11/23/2018  . IR ANGIO VERTEBRAL SEL SUBCLAVIAN INNOMINATE UNI L MOD SED  11/23/2018  . IR ANGIO VERTEBRAL SEL VERTEBRAL UNI R MOD SED  11/23/2018  . IR GENERIC HISTORICAL  11/03/2015   IR ANGIO VERTEBRAL SEL VERTEBRAL UNI L MOD SED 11/03/2015 Vicki Bras, MD [facility]  . IR GENERIC HISTORICAL  11/03/2015   IR ANGIO INTRA EXTRACRAN SEL COM CAROTID INNOMINATE BILAT MOD SED 11/03/2015 Vicki Bras, MD [facility]  . IR GENERIC HISTORICAL  11/03/2015   IR ANGIO VERTEBRAL SEL SUBCLAVIAN INNOMINATE UNI R MOD SED 11/03/2015 Vicki Bras, MD [facility]  . IR GENERIC HISTORICAL  11/19/2015   IR RADIOLOGIST EVAL & MGMT 11/19/2015 [facility]  . IR US GUIDE VASC ACCESS RIGHT  11/23/2018  . LEG SURGERY    . LOWER EXTREMITY ANGIOGRAM Left 10-04-12   and Left iliac stent  . LOWER EXTREMITY ANGIOGRAM N/A 10/04/2012   Procedure: LOWER EXTREMITY ANGIOGRAM;  Surgeon: Vicki Booze, MD;  Location: [facility] CATH LAB;  Service: Cardiovascular;  Laterality: N/A;  . TUBAL LIGATION      MEDICATIONS: No current facility-administered  medications for this encounter.    Marland Kitchen acetaminophen (TYLENOL) 500 MG tablet  . amLODipine (NORVASC) 10 MG tablet  . aspirin EC 81 MG tablet  . buPROPion (WELLBUTRIN SR) 150 MG 12 hr tablet  . Calcium-Vitamin D (CALTRATE 600 PLUS-VIT D PO)  . Cholecalciferol (VITAMIN D) 2000 UNITS CAPS  . cilostazol (PLETAL) 50 MG tablet  . clopidogrel (PLAVIX) 75 MG tablet  . hydrochlorothiazide (MICROZIDE) 12.5 MG capsule  . levothyroxine (SYNTHROID, LEVOTHROID) 25 MCG tablet  . Magnesium 250 MG TABS  . Multiple Vitamin (MULTIVITAMIN WITH MINERALS) TABS tablet  . pantoprazole (PROTONIX) 40 MG tablet  . potassium gluconate 595 MG TABS  . Probiotic Product (PROBIOTIC PO)  . Psyllium (METAMUCIL) 28.3 % POWD  . simvastatin (ZOCOR) 80 MG tablet  . vitamin B-12 (CYANOCOBALAMIN) 500 MCG tablet     Myra Gianotti, PA-C Surgical Short Stay/Anesthesiology [facility] Phone [phone] [facility] Phone [phone] 12/07/2018 4:50 PM

## 2018-12-07 NOTE — Anesthesia Preprocedure Evaluation (Addendum)
Anesthesia Evaluation  Patient identified by MRN, date of birth, ID band Patient awake    Reviewed: Allergy & Precautions, NPO status , Patient's Chart, lab work & pertinent test results  Airway Mallampati: II  TM Distance: >3 FB Neck ROM: Full    Dental  (+) Dental Advisory Given   Pulmonary Current Smoker and Patient abstained from smoking.,    breath sounds clear to auscultation       Cardiovascular hypertension, Pt. on medications  Rhythm:Regular Rate:Normal     Neuro/Psych negative neurological ROS     GI/Hepatic Neg liver ROS, PUD, GERD  ,  Endo/Other  Hypothyroidism   Renal/GU negative Renal ROS     Musculoskeletal  (+) Arthritis ,   Abdominal   Peds  Hematology negative hematology ROS (+)   Anesthesia Other Findings   Reproductive/Obstetrics                            Lab Results  Component Value Date   WBC 5.2 11/23/2018   HGB 14.1 11/23/2018   HCT 43.0 11/23/2018   MCV 87.8 11/23/2018   PLT 255 11/23/2018   Lab Results  Component Value Date   CREATININE 0.95 11/23/2018   BUN 13 11/23/2018   NA 142 11/23/2018   K 4.3 11/23/2018   CL 107 11/23/2018   CO2 28 11/23/2018    Anesthesia Physical Anesthesia Plan  ASA: III  Anesthesia Plan: General   Post-op Pain Management:    Induction: Intravenous  PONV Risk Score and Plan: 2 and Dexamethasone, Ondansetron and Treatment may vary due to age or medical condition  Airway Management Planned: Oral ETT  Additional Equipment: Arterial line  Intra-op Plan:   Post-operative Plan: Extubation in OR  Informed Consent: I have reviewed the patients History and Physical, chart, labs and discussed the procedure including the risks, benefits and alternatives for the proposed anesthesia with the patient or authorized representative who has indicated his/her understanding and acceptance.     Dental advisory given  Plan  Discussed with:   Anesthesia Plan Comments: (   )       Anesthesia Quick Evaluation

## 2018-12-07 NOTE — Progress Notes (Signed)
Patient denies shortness of breath, fever, cough and chest pain.  PCP - [facility] Cardiologist - Denies  Chest x-ray -denies  EKG - DOS 12/10/18 Stress Test - denies ECHO - denies Cardiac Cath - denies  Blood Thinner Instructions:  Per Md take plavix 75mg  on DOS. (Patient may take this at home morning of surgery).  Aspirin Instructions: MD has ordered 325mg  to be given in SS on DOS. Patient takes 81mg  daily.  Patient not instructed to take asa on DOS, we will give it to him in SS on DOS.Marland Kitchen  Anesthesia review: Yes, Ebony Hail. PA  STOP now taking any Aspirin (unless otherwise instructed by your surgeon), Aleve, Naproxen, Ibuprofen, Motrin, Advil, Goody's, BC's, all herbal medications, fish oil, and all vitamins.   Coronavirus Screening Have you experienced the following symptoms:  Cough yes/no: No Fever (>100.48F)  yes/no: No Runny nose yes/no: No Sore throat yes/no: No Difficulty breathing/shortness of breath  yes/no: No  Have you traveled in the last 14 days and where? yes/no: No  Patient verbalized understanding of instructions that were given via phone.

## 2018-12-08 LAB — NOVEL CORONAVIRUS, NAA (HOSP ORDER, SEND-OUT TO REF LAB; TAT 18-24 HRS): SARS-CoV-2, NAA: NOT DETECTED

## 2018-12-10 ENCOUNTER — Ambulatory Visit (HOSPITAL_COMMUNITY)
Admission: RE | Admit: 2018-12-10 | Discharge: 2018-12-10 | Disposition: A | Discharge status: Home / Self Care | Payer: Medicare HMO | Source: Ambulatory Visit | Attending: Interventional Radiology | Admitting: Interventional Radiology

## 2018-12-10 ENCOUNTER — Ambulatory Visit (HOSPITAL_COMMUNITY): Payer: Medicare HMO | Admitting: Vascular Surgery

## 2018-12-10 ENCOUNTER — Ambulatory Visit (HOSPITAL_COMMUNITY)
Admission: RE | Admit: 2018-12-10 | Discharge: 2018-12-10 | Disposition: A | Discharge status: Home / Self Care | Payer: Medicare HMO | Attending: Interventional Radiology | Admitting: Interventional Radiology

## 2018-12-10 ENCOUNTER — Other Ambulatory Visit: Payer: Self-pay

## 2018-12-10 ENCOUNTER — Encounter (HOSPITAL_COMMUNITY): Payer: Self-pay | Admitting: Certified Registered Nurse Anesthetist

## 2018-12-10 ENCOUNTER — Encounter (HOSPITAL_COMMUNITY)
Admission: RE | Disposition: A | Discharge status: Home / Self Care | Payer: Self-pay | Source: Home / Self Care | Attending: Interventional Radiology

## 2018-12-10 DIAGNOSIS — M1990 Unspecified osteoarthritis, unspecified site: Secondary | ICD-10-CM | POA: Diagnosis not present

## 2018-12-10 DIAGNOSIS — E039 Hypothyroidism, unspecified: Secondary | ICD-10-CM | POA: Diagnosis not present

## 2018-12-10 DIAGNOSIS — I771 Stricture of artery: Secondary | ICD-10-CM | POA: Diagnosis present

## 2018-12-10 DIAGNOSIS — K219 Gastro-esophageal reflux disease without esophagitis: Secondary | ICD-10-CM | POA: Diagnosis not present

## 2018-12-10 DIAGNOSIS — Z79890 Hormone replacement therapy (postmenopausal): Secondary | ICD-10-CM | POA: Diagnosis not present

## 2018-12-10 DIAGNOSIS — Z7982 Long term (current) use of aspirin: Secondary | ICD-10-CM | POA: Diagnosis not present

## 2018-12-10 DIAGNOSIS — Z8249 Family history of ischemic heart disease and other diseases of the circulatory system: Secondary | ICD-10-CM | POA: Diagnosis not present

## 2018-12-10 DIAGNOSIS — G2581 Restless legs syndrome: Secondary | ICD-10-CM | POA: Insufficient documentation

## 2018-12-10 DIAGNOSIS — E785 Hyperlipidemia, unspecified: Secondary | ICD-10-CM | POA: Insufficient documentation

## 2018-12-10 DIAGNOSIS — Z7902 Long term (current) use of antithrombotics/antiplatelets: Secondary | ICD-10-CM | POA: Insufficient documentation

## 2018-12-10 DIAGNOSIS — K589 Irritable bowel syndrome without diarrhea: Secondary | ICD-10-CM | POA: Insufficient documentation

## 2018-12-10 DIAGNOSIS — Z86718 Personal history of other venous thrombosis and embolism: Secondary | ICD-10-CM | POA: Diagnosis not present

## 2018-12-10 DIAGNOSIS — R7303 Prediabetes: Secondary | ICD-10-CM | POA: Diagnosis not present

## 2018-12-10 DIAGNOSIS — G450 Vertebro-basilar artery syndrome: Secondary | ICD-10-CM | POA: Insufficient documentation

## 2018-12-10 DIAGNOSIS — Z888 Allergy status to other drugs, medicaments and biological substances status: Secondary | ICD-10-CM | POA: Diagnosis not present

## 2018-12-10 DIAGNOSIS — Z885 Allergy status to narcotic agent status: Secondary | ICD-10-CM | POA: Insufficient documentation

## 2018-12-10 DIAGNOSIS — F17210 Nicotine dependence, cigarettes, uncomplicated: Secondary | ICD-10-CM | POA: Diagnosis not present

## 2018-12-10 DIAGNOSIS — I671 Cerebral aneurysm, nonruptured: Secondary | ICD-10-CM | POA: Diagnosis not present

## 2018-12-10 DIAGNOSIS — Z79899 Other long term (current) drug therapy: Secondary | ICD-10-CM | POA: Diagnosis not present

## 2018-12-10 DIAGNOSIS — Z95828 Presence of other vascular implants and grafts: Secondary | ICD-10-CM | POA: Diagnosis not present

## 2018-12-10 DIAGNOSIS — I10 Essential (primary) hypertension: Secondary | ICD-10-CM | POA: Diagnosis not present

## 2018-12-10 DIAGNOSIS — Z8719 Personal history of other diseases of the digestive system: Secondary | ICD-10-CM | POA: Insufficient documentation

## 2018-12-10 HISTORY — DX: Hypothyroidism, unspecified: E03.9

## 2018-12-10 HISTORY — DX: Depression, unspecified: F32.A

## 2018-12-10 HISTORY — PX: RADIOLOGY WITH ANESTHESIA: SHX6223

## 2018-12-10 HISTORY — DX: Prediabetes: R73.03

## 2018-12-10 HISTORY — DX: Unspecified cataract: H26.9

## 2018-12-10 HISTORY — PX: IR ANGIO VERTEBRAL SEL VERTEBRAL UNI L MOD SED: IMG5367

## 2018-12-10 LAB — URINALYSIS, COMPLETE (UACMP) WITH MICROSCOPIC
Bilirubin Urine: NEGATIVE
Glucose, UA: NEGATIVE mg/dL
Hgb urine dipstick: NEGATIVE
Ketones, ur: NEGATIVE mg/dL
Leukocytes,Ua: NEGATIVE
Nitrite: NEGATIVE
Protein, ur: NEGATIVE mg/dL
Specific Gravity, Urine: 1.018 (ref 1.005–1.030)
pH: 6 (ref 5.0–8.0)

## 2018-12-10 LAB — CBC WITH DIFFERENTIAL/PLATELET
Abs Immature Granulocytes: 0.03 10*3/uL (ref 0.00–0.07)
Basophils Absolute: 0 10*3/uL (ref 0.0–0.1)
Basophils Relative: 1 %
Eosinophils Absolute: 0 10*3/uL (ref 0.0–0.5)
Eosinophils Relative: 0 %
HCT: 45 % (ref 36.0–46.0)
Hemoglobin: 14.4 g/dL (ref 12.0–15.0)
Immature Granulocytes: 1 %
Lymphocytes Relative: 28 %
Lymphs Abs: 1.4 10*3/uL (ref 0.7–4.0)
MCH: 28.1 pg (ref 26.0–34.0)
MCHC: 32 g/dL (ref 30.0–36.0)
MCV: 87.7 fL (ref 80.0–100.0)
Monocytes Absolute: 0.4 10*3/uL (ref 0.1–1.0)
Monocytes Relative: 8 %
Neutro Abs: 3.2 10*3/uL (ref 1.7–7.7)
Neutrophils Relative %: 62 %
Platelets: 247 10*3/uL (ref 150–400)
RBC: 5.13 MIL/uL — ABNORMAL HIGH (ref 3.87–5.11)
RDW: 15.4 % (ref 11.5–15.5)
WBC: 5 10*3/uL (ref 4.0–10.5)
nRBC: 0 % (ref 0.0–0.2)

## 2018-12-10 LAB — BASIC METABOLIC PANEL
Anion gap: 10 (ref 5–15)
BUN: 14 mg/dL (ref 8–23)
CO2: 29 mmol/L (ref 22–32)
Calcium: 9.3 mg/dL (ref 8.9–10.3)
Chloride: 102 mmol/L (ref 98–111)
Creatinine, Ser: 1 mg/dL (ref 0.44–1.00)
GFR calc Af Amer: >60 mL/min (ref >60)
GFR calc non Af Amer: 58 mL/min — ABNORMAL LOW (ref >60)
Glucose, Bld: 92 mg/dL (ref 70–99)
Potassium: 4.1 mmol/L (ref 3.5–5.1)
Sodium: 141 mmol/L (ref 135–145)

## 2018-12-10 LAB — PROTIME-INR
INR: 1 (ref 0.8–1.2)
Prothrombin Time: 12.6 seconds (ref 11.4–15.2)

## 2018-12-10 LAB — APTT: aPTT: 26 seconds (ref 24–36)

## 2018-12-10 LAB — PLATELET INHIBITION P2Y12: Platelet Function  P2Y12: 152 [PRU] — ABNORMAL LOW (ref 182–335)

## 2018-12-10 LAB — GLUCOSE, CAPILLARY: Glucose-Capillary: 98 mg/dL (ref 70–99)

## 2018-12-10 SURGERY — IR WITH ANESTHESIA
Anesthesia: General

## 2018-12-10 MED ORDER — ASPIRIN EC 325 MG PO TBEC
325.0000 mg | DELAYED_RELEASE_TABLET | ORAL | Status: AC
  Administered 2018-12-10: 325 mg via ORAL
  Filled 2018-12-10: qty 1

## 2018-12-10 MED ORDER — HEPARIN SODIUM (PORCINE) 1000 UNIT/ML IJ SOLN
INTRAMUSCULAR | Status: DC | PRN
  Administered 2018-12-10: 3000 [IU] via INTRAVENOUS

## 2018-12-10 MED ORDER — MIDAZOLAM HCL 2 MG/2ML IJ SOLN
INTRAMUSCULAR | Status: AC
  Filled 2018-12-10: qty 2

## 2018-12-10 MED ORDER — ESMOLOL HCL 100 MG/10ML IV SOLN
INTRAVENOUS | Status: DC | PRN
  Administered 2018-12-10: 20 mg via INTRAVENOUS

## 2018-12-10 MED ORDER — FENTANYL CITRATE (PF) 100 MCG/2ML IJ SOLN
INTRAMUSCULAR | Status: AC
  Filled 2018-12-10: qty 2

## 2018-12-10 MED ORDER — PROPOFOL 10 MG/ML IV BOLUS
INTRAVENOUS | Status: DC | PRN
  Administered 2018-12-10: 130 mg via INTRAVENOUS

## 2018-12-10 MED ORDER — CEFAZOLIN SODIUM-DEXTROSE 2-4 GM/100ML-% IV SOLN
INTRAVENOUS | Status: AC
  Filled 2018-12-10: qty 100

## 2018-12-10 MED ORDER — SUGAMMADEX SODIUM 200 MG/2ML IV SOLN
INTRAVENOUS | Status: DC | PRN
  Administered 2018-12-10: 400 mg via INTRAVENOUS

## 2018-12-10 MED ORDER — CEFAZOLIN SODIUM-DEXTROSE 2-4 GM/100ML-% IV SOLN
2.0000 g | INTRAVENOUS | Status: AC
  Administered 2018-12-10: 2 g via INTRAVENOUS

## 2018-12-10 MED ORDER — SODIUM CHLORIDE 0.9 % IV SOLN
INTRAVENOUS | Status: AC
  Administered 2018-12-10: 15:00:00 via INTRAVENOUS

## 2018-12-10 MED ORDER — DEXAMETHASONE SODIUM PHOSPHATE 10 MG/ML IJ SOLN
INTRAMUSCULAR | Status: DC | PRN
  Administered 2018-12-10: 5 mg via INTRAVENOUS

## 2018-12-10 MED ORDER — ROCURONIUM BROMIDE 100 MG/10ML IV SOLN
INTRAVENOUS | Status: DC | PRN
  Administered 2018-12-10: 100 mg via INTRAVENOUS

## 2018-12-10 MED ORDER — LACTATED RINGERS IV SOLN
INTRAVENOUS | Status: DC
  Administered 2018-12-10: 10:00:00 via INTRAVENOUS

## 2018-12-10 MED ORDER — FENTANYL CITRATE (PF) 250 MCG/5ML IJ SOLN
INTRAMUSCULAR | Status: AC
  Filled 2018-12-10: qty 5

## 2018-12-10 MED ORDER — NITROGLYCERIN 1 MG/10 ML FOR IR/CATH LAB
INTRA_ARTERIAL | Status: AC
  Filled 2018-12-10: qty 10

## 2018-12-10 MED ORDER — SUCCINYLCHOLINE CHLORIDE 20 MG/ML IJ SOLN
INTRAMUSCULAR | Status: DC | PRN
  Administered 2018-12-10: 80 mg via INTRAVENOUS

## 2018-12-10 MED ORDER — SODIUM CHLORIDE 0.9 % IV SOLN: INTRAVENOUS | Status: DC

## 2018-12-10 MED ORDER — CLOPIDOGREL BISULFATE 75 MG PO TABS
ORAL_TABLET | ORAL | Status: AC | PRN
  Administered 2018-12-10: 75 mg via ORAL

## 2018-12-10 MED ORDER — PHENYLEPHRINE HCL-NACL 10-0.9 MG/250ML-% IV SOLN
INTRAVENOUS | Status: DC | PRN
  Administered 2018-12-10: 10 ug/min via INTRAVENOUS

## 2018-12-10 MED ORDER — ONDANSETRON HCL 4 MG/2ML IJ SOLN
INTRAMUSCULAR | Status: AC
  Filled 2018-12-10: qty 2

## 2018-12-10 MED ORDER — FENTANYL CITRATE (PF) 100 MCG/2ML IJ SOLN
INTRAMUSCULAR | Status: DC | PRN
  Administered 2018-12-10 (×2): 50 ug via INTRAVENOUS

## 2018-12-10 MED ORDER — PROMETHAZINE HCL 25 MG/ML IJ SOLN
INTRAMUSCULAR | Status: AC
  Filled 2018-12-10: qty 1

## 2018-12-10 MED ORDER — IOHEXOL 300 MG/ML  SOLN
150.0000 mL | Freq: Once | INTRAMUSCULAR | Status: AC | PRN
  Administered 2018-12-10: 42 mL via INTRA_ARTERIAL

## 2018-12-10 MED ORDER — MIDAZOLAM HCL 5 MG/5ML IJ SOLN
INTRAMUSCULAR | Status: DC | PRN
  Administered 2018-12-10: 2 mg via INTRAVENOUS

## 2018-12-10 MED ORDER — LIDOCAINE HCL (CARDIAC) PF 100 MG/5ML IV SOSY
PREFILLED_SYRINGE | INTRAVENOUS | Status: DC | PRN
  Administered 2018-12-10: 60 mg via INTRAVENOUS

## 2018-12-10 MED ORDER — ONDANSETRON HCL 4 MG/2ML IJ SOLN
INTRAMUSCULAR | Status: DC | PRN
  Administered 2018-12-10: 4 mg via INTRAVENOUS

## 2018-12-10 MED ORDER — FENTANYL CITRATE (PF) 100 MCG/2ML IJ SOLN
25.0000 ug | INTRAMUSCULAR | Status: DC | PRN
  Administered 2018-12-10 (×2): 50 ug via INTRAVENOUS

## 2018-12-10 MED ORDER — PROMETHAZINE HCL 25 MG/ML IJ SOLN: 6.2500 mg | INTRAMUSCULAR | Status: DC | PRN

## 2018-12-10 MED ORDER — CLOPIDOGREL BISULFATE 75 MG PO TABS
75.0000 mg | ORAL_TABLET | ORAL | Status: AC
  Administered 2018-12-10: 75 mg via ORAL
  Filled 2018-12-10: qty 1

## 2018-12-10 MED ORDER — PROMETHAZINE HCL 25 MG/ML IJ SOLN
6.2500 mg | INTRAMUSCULAR | Status: DC | PRN
  Administered 2018-12-10: 12.5 mg via INTRAVENOUS

## 2018-12-10 MED ORDER — CLOPIDOGREL BISULFATE 75 MG PO TABS
ORAL_TABLET | ORAL | Status: AC
  Filled 2018-12-10: qty 1

## 2018-12-10 MED ORDER — NIMODIPINE 30 MG PO CAPS: 0.0000 mg | ORAL_CAPSULE | ORAL | Status: DC

## 2018-12-10 NOTE — Sedation Documentation (Signed)
Pt under the care of Anesthesia 

## 2018-12-10 NOTE — Procedures (Signed)
S/P Lt vertebral arterriogram RT CFDA approach  Finding. Marland Kitchen S.Deveshwar MD 1.Multipe selective Lt VA arteriograms reveal approx 55 %  To 60% stenosis.24f the dominant Lt VBJ

## 2018-12-10 NOTE — Anesthesia Procedure Notes (Signed)
Arterial Line Insertion Start/End11/03/2018 10:48 AM, 12/10/2018 10:55 AM Performed by: Flowers, Rokoshi T, Immunologist, CRNA  Patient location: OOR procedure area. Preanesthetic checklist: patient identified, IV checked, site marked, risks and benefits discussed, surgical consent, monitors and equipment checked, pre-op evaluation and timeout performed Patient sedated Left, radial was placed Catheter size: 20 G Hand hygiene performed  and maximum sterile barriers used   Attempts: 1 Procedure performed without using ultrasound guided technique. Following insertion, dressing applied. Post procedure assessment: normal  Patient tolerated the procedure well with no immediate complications.

## 2018-12-10 NOTE — Anesthesia Procedure Notes (Signed)
Procedure Name: Intubation Date/Time: 12/10/2018 10:47 AM Performed by: Flowers, Rokoshi T, CRNA Pre-anesthesia Checklist: Patient identified, Emergency Drugs available, Suction available and Patient being monitored Patient Re-evaluated:Patient Re-evaluated prior to induction Oxygen Delivery Method: Circle system utilized Preoxygenation: Pre-oxygenation with 100% oxygen Induction Type: IV induction and Rapid sequence Ventilation: Mask ventilation without difficulty Laryngoscope Size: Miller and 2 Grade View: Grade II Tube type: Oral Tube size: 7.0 mm Number of attempts: 1 Airway Equipment and Method: Patient positioned with wedge pillow and Stylet Placement Confirmation: ETT inserted through vocal cords under direct vision,  positive ETCO2 and breath sounds checked- equal and bilateral Secured at: 21 cm Tube secured with: Tape Dental Injury: Teeth and Oropharynx as per pre-operative assessment

## 2018-12-10 NOTE — Progress Notes (Signed)
NIR.  Patient underwent an image-guided cerebral arteriogram with intent to treat left VBJ stenosis this AM by Dr. Estanislado Pandy. However, cerebral arteriogram revealed improved stenosis of left VBJ (currently 55-60%, was 70-75% stenotic on dx cerebral arteriogram 11/23/2018), therefore procedure was aborted.  Went to evaluate patient in PACU alongside Dr. Estanislado Pandy. Patient laying in bed resting. She responds to voice, answers questions appropriately, and follows simple commands. Complains of back pain from laying flat. Denies headache, weakness, numbness/tingling, dizziness, vision changes, hearing changes, tinnitus, or speech difficulty.  Alert, awake, and oriented x3. Speech and comprehension intact. PERRL bilaterally. No facial asymmetry. Tongue midline. Motor power symmetric proportional to effort. No pronator drift. Distal pulses 2+ bilaterally. Right groin incision soft without active bleeding or hematoma.  Discussed above findings with patient and explained why we did not move forward with treatment today (stenosis has improved at this time). Plan to discharge home today and plan for follow-up with MRI/MRA brain/head (without contrast) in 6 months- order placed to facilitate this. Further orders per Dr. Estanislado Pandy. Please call NIR with questions/concerns.   Vicki Graff Louk, PA-C 12/10/2018, 3:08 PM

## 2018-12-10 NOTE — Transfer of Care (Signed)
Immediate Anesthesia Transfer of Care Note  Patient: Vicki Page  Procedure(s) Performed: STENTING (N/A )  Patient Location: PACU  Anesthesia Type:General  Level of Consciousness: awake, alert  and oriented  Airway & Oxygen Therapy: Patient Spontanous Breathing and Patient connected to nasal cannula oxygen  Post-op Assessment: Report given to RN, Post -op Vital signs reviewed and stable and Patient moving all extremities  Post vital signs: Reviewed and stable  Last Vitals:  Vitals Value Taken Time  BP 151/70 12/10/18 1227  Temp    Pulse 66 12/10/18 1235  Resp 13 12/10/18 1235  SpO2 100 % 12/10/18 1235  Vitals shown include unvalidated device data.  Last Pain:  Vitals:   12/10/18 0736  PainSc: 0-No pain      Patients Stated Pain Goal: 3 ([phone] [phone])  Complications: No apparent anesthesia complications

## 2018-12-10 NOTE — H&P (Signed)
Chief Complaint: Patient was seen in consultation today for vertebrobasilar junction stenosis  Referring Physician(s): Dr. Trula Slade  Supervising Physician: Luanne Bras  Patient Status: [facility] - Out-pt  History of Present Illness: Vicki Page is a 68 y.o. female with past medical history of hypertension, hyperlipidemia, DVT, GERD, Barrett's esophagus, H. Pylori, diverticulosis, hyperthyroidism, DDD, OA, restless leg syndrome, and anxiety who has been followed by Dr. Estanislado Pandy since 2011. 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  She underwent diagnostic angiogram in 2017 to characterize her stenoses which was largely stable until recent MRA 10/31/18 at which time new moderate segmental stenosis of the petrous right ICA was found.  She underwent diagnostic angiogram again 11/23/18 as in addition to her image findings she was also complaining of new sytmptoms including headaches, blurred vision, dizziness.    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  Approximately 50% stenosis of the right internal carotid artery at the petrous cavernous junction region.  Approximately 30% stenosis of the left internal carotid artery at the bulb.  Approximately 40-50% stenosis of the cavernous segment of the left internal carotid artery.  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  After discussion with Dr. Estanislado Pandy regarding current status and treatment options, she elected to proceed  with possible angioplasty vs. Stent placement of left VBJ stenosis.  Patient presents to [facility]iology for procedure today.  She is scheduled for intervention with general anesthesia.  She denies headaches or dizziness today.  She has been NPO. She is on Plavix 75 mg and aspirin 81mg .   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 pylori infection    Hyperlipidemia    Hypertension    Hypothyroidism    IBS (irritable bowel syndrome)    Osteoarthritis    Pre-diabetes    no meds, diet controlled   RLS (restless legs syndrome)    Tubular adenoma of colon    Ulcer of the stomach and intestine     Past Surgical History:  Procedure Laterality Date   ANAL RECTAL MANOMETRY N/A 05/04/2016   Procedure: ANO RECTAL MANOMETRY;  Surgeon: Mauri Pole, MD;  Location: [facility] ENDOSCOPY;  Service: Endoscopy;  Laterality: N/A;   ANEURYSM COILING  July 26, 2010   stent   BRAIN SURGERY     Per patient " Left side behind eyes   INSERTION OF ILIAC STENT  10/04/2012   Procedure: INSERTION OF ILIAC STENT;  Surgeon: Jettie Booze, MD;  Location: [facility] CATH LAB;  Service: Cardiovascular;;  Left  External Iliac Artery   IR ANGIO INTRA EXTRACRAN SEL COM CAROTID INNOMINATE BILAT MOD SED  11/23/2018   IR ANGIO VERTEBRAL SEL SUBCLAVIAN INNOMINATE UNI L MOD SED  11/23/2018   IR ANGIO VERTEBRAL SEL VERTEBRAL UNI R MOD SED  11/23/2018   IR GENERIC HISTORICAL  11/03/2015   IR ANGIO VERTEBRAL SEL VERTEBRAL UNI L MOD SED 11/03/2015 Luanne Bras, MD [facility]INTER[facility] RAD   IR GENERIC HISTORICAL  11/03/2015   IR ANGIO INTRA EXTRACRAN SEL COM CAROTID INNOMINATE BILAT MOD SED 11/03/2015 Luanne Bras, MD [facility]   IR GENERIC HISTORICAL  11/03/2015   IR ANGIO VERTEBRAL SEL SUBCLAVIAN INNOMINATE UNI R MOD SED 11/03/2015 Luanne Bras, MD [facility]   IR GENERIC HISTORICAL  11/19/2015   IR RADIOLOGIST EVAL & MGMT 11/19/2015 [facility]   IR US GUIDE VASC ACCESS RIGHT  11/23/2018   LEG SURGERY     LOWER EXTREMITY ANGIOGRAM Left 10-04-12   and Left iliac stent   LOWER EXTREMITY ANGIOGRAM N/A 10/04/2012   Procedure: LOWER EXTREMITY ANGIOGRAM;  Surgeon: Jettie Booze, MD;  Location: [facility] CATH LAB;  Service: Cardiovascular;  Laterality: N/A;   TUBAL LIGATION      Allergies: Ropinirole, Codeine, Darvon [propoxyphene hcl], and Flagyl [metronidazole]  Medications: Prior to Admission medications   Medication Sig Start Date End Date Taking? Authorizing Provider  acetaminophen (TYLENOL) 500 MG tablet Take 1,000 mg by mouth 2 (two) times daily as needed for pain.    Yes [provider]  amLODipine (NORVASC) 10 MG tablet Take 10 mg by mouth daily. for high blood pressure 10/08/18  Yes [provider]  aspirin EC 81 MG tablet Take 81 mg by mouth daily.   Yes [provider]  buPROPion (WELLBUTRIN SR) 150 MG 12 hr tablet Take 150 mg by mouth 2 (two) times daily.   Yes [provider]  Calcium-Vitamin D (CALTRATE 600 PLUS-VIT D PO) Take 1 tablet by mouth daily before supper.    Yes [provider]  Cholecalciferol (VITAMIN D) 2000 UNITS CAPS Take 2,000 Units by mouth daily before supper.    Yes [provider]  cilostazol (PLETAL) 50 MG tablet Take 50 mg by mouth 2 (two) times daily. 10/30/18  Yes [provider]  clopidogrel (PLAVIX) 75 MG tablet Take 1 tablet (75 mg total) by mouth daily with breakfast. 10/05/12  Yes Jettie Booze, MD  hydrochlorothiazide  (MICROZIDE) 12.5 MG capsule Take 12.5 mg by mouth daily. 10/05/18  Yes [provider]  levothyroxine (SYNTHROID, LEVOTHROID) 25 MCG tablet Take 25 mcg by mouth daily before breakfast.    Yes [provider]  Magnesium 250 MG TABS Take 250 mg by mouth daily before supper.   Yes [provider]  Multiple Vitamin (MULTIVITAMIN WITH MINERALS) TABS tablet Take 1 tablet by mouth daily before supper. Centrum Silver    Yes [provider]  pantoprazole (PROTONIX) 40 MG tablet Take 40 mg by mouth daily.   Yes [provider]  potassium gluconate 595 MG TABS Take 595 mg by mouth daily before supper.    Yes [provider]  Probiotic Product (PROBIOTIC PO) Take 1 tablet by mouth daily after lunch.    Yes [provider]  Psyllium (METAMUCIL) 28.3 % POWD Take 1 Scoop by mouth daily.   Yes [provider]  simvastatin (ZOCOR) 80 MG tablet Take 80 mg by mouth daily with supper.    Yes [provider]  vitamin B-12 (CYANOCOBALAMIN) 500  MCG tablet Take 500 mcg by mouth daily.   Yes [provider]     Family History  Problem Relation Age of Onset   Deep vein thrombosis Mother    Diabetes Mother        Amputation   Heart disease Mother        Heart Disease before age 89   Hyperlipidemia Mother    Hypertension Mother    Heart attack Mother    Stroke Mother    Thyroid disease Mother    Heart disease Father        Heart Disease before age 70   Hypertension Father    Heart attack Father    Hyperlipidemia Father    Heart disease Brother        Heart Disease before age 61   Hyperlipidemia Brother    Hypertension Brother    Heart attack Brother    COPD Brother    Diabetes Brother    Colonic polyp Brother    Colon cancer Neg Hx    Stomach cancer Neg Hx     Social History   Socioeconomic History   Marital status: Single    Spouse name: Not on file   Number of children: 2   Years of  education: 16   Highest education level: Not on file  Occupational History   Occupation: Retired  Scientist, product/process development strain: Not on file   Food insecurity    Worry: Not on file    Inability: Not on Lexicographer needs    Medical: Not on file    Non-medical: Not on file  Tobacco Use   Smoking status: Light Tobacco Smoker    Packs/day: 0.25    Types: Cigarettes   Smokeless tobacco: Never Used   Tobacco comment: 7 or more depending on the day  Substance and Sexual Activity   Alcohol use: No    Alcohol/week: 0.0 standard drinks   Drug use: No   Sexual activity: Never    Birth control/protection: Post-menopausal  Lifestyle   Physical activity    Days per week: Not on file    Minutes per session: Not on file   Stress: Not on file  Relationships   Social connections    Talks on phone: Not on file    Gets together: Not on file    Attends religious service: Not on file    Active member of club or organization: Not on file    Attends meetings of clubs or organizations: Not on file    Relationship status: Not on file  Other Topics Concern   Not on file  Social History Narrative   Lives alone   Caffeine use: none   Right handed     Review of Systems: A 12 point ROS discussed and pertinent positives are indicated in the HPI above.  All other systems are negative.  Review of Systems  Constitutional: Negative for fatigue and fever.  Respiratory: Negative for cough and shortness of breath.   Cardiovascular: Negative for chest pain.  Gastrointestinal: Negative for abdominal pain.  Genitourinary: Negative for dysuria, frequency and urgency.  Musculoskeletal: Negative for back pain.  Psychiatric/Behavioral: Negative for behavioral problems and confusion.    Vital Signs: Ht 5\' 1"  (1.549 m)    Wt 147 lb (66.7 kg)    LMP  (LMP Unknown)    BMI 27.78 kg/m   Physical Exam Vitals signs and nursing note reviewed.  Constitutional:  General: She is not in acute distress.    Appearance: Normal appearance.  HENT:     Mouth/Throat:     Mouth: Mucous membranes are moist.     Pharynx: Oropharynx is clear.  Cardiovascular:     Rate and Rhythm: Normal rate and regular rhythm.     Heart sounds: No murmur.  Pulmonary:     Effort: Pulmonary effort is normal. No respiratory distress.     Breath sounds: Normal breath sounds.  Abdominal:     General: Abdomen is flat. There is no distension.     Palpations: Abdomen is soft.  Skin:    General: Skin is warm and dry.  Neurological:     General: No focal deficit present.     Mental Status: She is alert and oriented to person, place, and time. Mental status is at baseline.     Comments: Tongue midline, PERRL, speech clear, moving all extremities, no focal deficits   Psychiatric:        Mood and Affect: Mood normal.        Behavior: Behavior normal.        Thought Content: Thought content normal.        Judgment: Judgment normal.      MD Evaluation Airway: WNL Heart: WNL Abdomen: WNL Chest/ Lungs: WNL ASA  Classification: 3 Mallampati/Airway Score: Two   Imaging: Ir US Guide Vasc Access Right  Result Date: 11/27/2018 CLINICAL DATA:  Recurrence of vertebrobasilar ischemic symptoms of dizziness vertigo, and transient blindness and double vision as per patient and also her daughter. Duration of symptoms approximately 6 months. EXAM: BILATERAL COMMON CAROTID AND INNOMINATE ANGIOGRAPHY; IR ULTRASOUND GUIDANCE VASC ACCESS RIGHT; IR ANGIO VERTEBRAL SEL SUBCLAVIAN INNOMINATE UNI LEFT MOD SED; IR ANGIO VERTEBRAL SEL VERTEBRAL UNI RIGHT MOD SED COMPARISON:  MRA of the brain of September 23. MEDICATIONS: Heparin 2000 units IA. No antibiotic was administered within 1 hour of the procedure. ANESTHESIA/SEDATION: Versed 1 mg IV; Fentanyl 25 mcg IV Moderate Sedation Time:  49 minutes The patient was continuously monitored during the procedure by the interventional radiology nurse under  my direct supervision. CONTRAST:  Isovue 300 approximately 60 mL. FLUOROSCOPY TIME:  Fluoroscopy Time: 17 minutes 24 seconds (965 mGy). COMPLICATIONS: None immediate. TECHNIQUE: Informed written consent was obtained from the patient after a thorough discussion of the procedural risks, benefits and alternatives. All questions were addressed. Maximal Sterile Barrier Technique was utilized including caps, mask, sterile gowns, sterile gloves, sterile drape, hand hygiene and skin antiseptic. A timeout was performed prior to the initiation of the procedure. The right forearm to the right wrist was prepped and draped in the usual sterile fashion. The right radial artery was then palpated and a dorsal palmar anastomosis was verified. Right radial artery was then identified with ultrasound and its morphology was then documented. Using ultrasound guidance, transradial access was then obtained with a micropuncture needle. Over a 0.018 inch micro guidewire, a 4/5 French radial sheath was then inserted without difficulty. The obturator and the micro guidewire were then removed. Good aspiration was obtained from the side port of the radial sheath. A cocktail of 2.5 mg of Versed, 200 mcg of nitroglycerin, and 2000 units of heparin were then injected through the radial sheath in diluted form without difficulty. A right radial arteriogram was then obtained. Over a 0.035 inch Roadrunner guidewire, a 5 [place] Simmons 2 diagnostic catheter was then advanced to the aortic arch region, and selectively positioned in the right vertebral artery, the right  common carotid artery, the left common carotid artery and the left subclavian artery proximal to the orifice of the left vertebral artery. At the end of the procedure, the right radial puncture site was then closed with a right wrist band for hemostasis. The distal right radial pulse was verified to be present at the end of the procedure. FINDINGS: The origin the right vertebral artery is  widely patent. There is mild to moderate tortuosity just distal to the origin of the right vertebral artery. More distally the vessel is seen to opacify to the cranial skull base. Wide patency is seen of the right vertebrobasilar junction and the right posterior-inferior cerebellar artery. The opacified portion of the basilar artery, the superior cerebellar arteries and the anterior-inferior cerebellar arteries is seen into the capillary and venous phases. There is approximately 50% stenosis of the right posterior cerebral artery P1 segment and also of the left posterior cerebral artery distal P1 segment. Unopacified blood is seen in the basilar artery from the contralateral vertebral artery. The right common carotid arteriogram demonstrates the right external carotid artery and its major branches to be widely patent. The right internal carotid artery at the bulb to the cranial skull base demonstrates wide patency. There is a double U-shaped configuration of the mid right ICA without evidence of kinking. The petrous segment is widely patent. There is approximately 70% stenosis at the petrous cavernous junction. Distal to this the cavernous and the right internal carotid artery supraclinoid segments are widely patent. The right middle cerebral artery opacifies into the capillary and venous phases. There is no opacification of the right anterior cerebral A1 segment. The left common carotid arteriogram demonstrates the left external carotid artery and its major branches to be widely patent. The left internal carotid artery at the bulb has approximately 30% stenosis secondary to a smooth lateral soft plaque. No evidence of intraluminal filling defects or of ulcerations is seen. The vessel is seen to opacify to the cranial skull base. There is again approximately 40-50% stenosis of the cavernous segment of the left internal carotid artery. The previously endovascularly coiled left internal carotid superior hypophyseal  region aneurysm remains completely obliterated without evidence of coil compaction. The left middle cerebral artery opacifies into the capillary and venous phases. The left anterior cerebral artery opacifies also into the capillary and venous phases. There is prompt cross filling via the anterior communicating artery of the right anterior cerebral A2 segment and distally. The dominant left vertebral artery origin is widely patent. The vessel is seen to opacify to the cranial skull base. Just proximal to the left posterior-inferior cerebellar artery there is a severe focal narrowing of the left vertebrobasilar junction secondary to a smooth plaque with stenosis on the lateral projection of approximately 85%, and of approximately 65% on the AP projection. Distal to this the left posterior-inferior cerebellar artery and the left vertebrobasilar junction are widely patent. The opacified portion of the basilar artery, the posterior cerebral arteries, the superior cerebellar arteries and the anterior-inferior cerebellar arteries demonstrates patency into the capillary and venous phases. 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 Approximately 50% stenosis of the right internal carotid artery at the petrous cavernous junction region. Approximately 30% stenosis of the left internal carotid artery at the bulb. Approximately 40-50% stenosis of the cavernous segment of the left internal carotid artery. Previously endovascularly coiled left internal carotid  artery superior hypophyseal region aneurysm remains completely obliterated with no evidence of coil compaction. PLAN: Angio findings were reviewed with the patient and the patient's daughter. Given patient's recurrent symptoms of vertebrobasilar ischemia over the past 6 months it was felt further  management considerations of the dominant left vertebrobasilar junction severe stenosis, and possibly the right internal carotid artery petrous cavernous junction stenosis needed to be discussed. Also the symptoms of vertebrobasilar ischemia over the past 6 months have recurred despite the patient taking aspirin, and Plavix with regular compliance. Patient advised to return to the clinic in the next few days regarding further discussion regarding management of these findings. Electronically Signed   By: Luanne Bras M.D.   On: 11/23/2018 16:31   Ir Angio Intra Extracran Sel Com Carotid Innominate Bilat Mod Sed  Result Date: 11/27/2018 CLINICAL DATA:  Recurrence of vertebrobasilar ischemic symptoms of dizziness vertigo, and transient blindness and double vision as per patient and also her daughter. Duration of symptoms approximately 6 months. EXAM: BILATERAL COMMON CAROTID AND INNOMINATE ANGIOGRAPHY; IR ULTRASOUND GUIDANCE VASC ACCESS RIGHT; IR ANGIO VERTEBRAL SEL SUBCLAVIAN INNOMINATE UNI LEFT MOD SED; IR ANGIO VERTEBRAL SEL VERTEBRAL UNI RIGHT MOD SED COMPARISON:  MRA of the brain of September 23. MEDICATIONS: Heparin 2000 units IA. No antibiotic was administered within 1 hour of the procedure. ANESTHESIA/SEDATION: Versed 1 mg IV; Fentanyl 25 mcg IV Moderate Sedation Time:  49 minutes The patient was continuously monitored during the procedure by the interventional radiology nurse under my direct supervision. CONTRAST:  Isovue 300 approximately 60 mL. FLUOROSCOPY TIME:  Fluoroscopy Time: 17 minutes 24 seconds (965 mGy). COMPLICATIONS: None immediate. TECHNIQUE: Informed written consent was obtained from the patient after a thorough discussion of the procedural risks, benefits and alternatives. All questions were addressed. Maximal Sterile Barrier Technique was utilized including caps, mask, sterile gowns, sterile gloves, sterile drape, hand hygiene and skin antiseptic. A timeout was performed prior to  the initiation of the procedure. The right forearm to the right wrist was prepped and draped in the usual sterile fashion. The right radial artery was then palpated and a dorsal palmar anastomosis was verified. Right radial artery was then identified with ultrasound and its morphology was then documented. Using ultrasound guidance, transradial access was then obtained with a micropuncture needle. Over a 0.018 inch micro guidewire, a 4/5 French radial sheath was then inserted without difficulty. The obturator and the micro guidewire were then removed. Good aspiration was obtained from the side port of the radial sheath. A cocktail of 2.5 mg of Versed, 200 mcg of nitroglycerin, and 2000 units of heparin were then injected through the radial sheath in diluted form without difficulty. A right radial arteriogram was then obtained. Over a 0.035 inch Roadrunner guidewire, a 5 [place] Simmons 2 diagnostic catheter was then advanced to the aortic arch region, and selectively positioned in the right vertebral artery, the right common carotid artery, the left common carotid artery and the left subclavian artery proximal to the orifice of the left vertebral artery. At the end of the procedure, the right radial puncture site was then closed with a right wrist band for hemostasis. The distal right radial pulse was verified to be present at the end of the procedure. FINDINGS: The origin the right vertebral artery is widely patent. There is mild to moderate tortuosity just distal to the origin of the right vertebral artery. More distally the vessel is seen to opacify to the cranial skull base. Wide patency is seen of the  right vertebrobasilar junction and the right posterior-inferior cerebellar artery. The opacified portion of the basilar artery, the superior cerebellar arteries and the anterior-inferior cerebellar arteries is seen into the capillary and venous phases. There is approximately 50% stenosis of the right posterior  cerebral artery P1 segment and also of the left posterior cerebral artery distal P1 segment. Unopacified blood is seen in the basilar artery from the contralateral vertebral artery. The right common carotid arteriogram demonstrates the right external carotid artery and its major branches to be widely patent. The right internal carotid artery at the bulb to the cranial skull base demonstrates wide patency. There is a double U-shaped configuration of the mid right ICA without evidence of kinking. The petrous segment is widely patent. There is approximately 70% stenosis at the petrous cavernous junction. Distal to this the cavernous and the right internal carotid artery supraclinoid segments are widely patent. The right middle cerebral artery opacifies into the capillary and venous phases. There is no opacification of the right anterior cerebral A1 segment. The left common carotid arteriogram demonstrates the left external carotid artery and its major branches to be widely patent. The left internal carotid artery at the bulb has approximately 30% stenosis secondary to a smooth lateral soft plaque. No evidence of intraluminal filling defects or of ulcerations is seen. The vessel is seen to opacify to the cranial skull base. There is again approximately 40-50% stenosis of the cavernous segment of the left internal carotid artery. 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 The left middle cerebral artery opacifies into the capillary and venous phases. The left anterior cerebral artery opacifies also into the capillary and venous phases. There is prompt cross filling via the anterior communicating artery of the right anterior cerebral A2 segment and distally. The dominant left vertebral artery origin is widely patent. The vessel is seen to opacify to the cranial skull base. Just proximal to the left  posterior-inferior cerebellar artery there is a severe focal narrowing of the left vertebrobasilar junction secondary to a smooth plaque with stenosis on the lateral projection of approximately 85%, and of approximately 65% on the AP projection. Distal to this the left posterior-inferior cerebellar artery and the left vertebrobasilar junction are widely patent. The opacified portion of the basilar artery, the posterior cerebral arteries, the superior cerebellar arteries and the anterior-inferior cerebellar arteries demonstrates patency into the capillary and venous phases. 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 Approximately 50% stenosis of the right internal carotid artery at the petrous cavernous junction region. Approximately 30% stenosis of the left internal carotid artery at the bulb. Approximately 40-50% stenosis of the cavernous segment of the left internal carotid artery. 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 PLAN: Angio findings were reviewed with the patient and the patient's daughter. 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 Also the symptoms of vertebrobasilar ischemia over the past 6 months have recurred despite the patient taking aspirin, and Plavix with regular compliance. Patient advised to return to the clinic in the next few days regarding further  discussion regarding management of these findings. Electronically Signed   By: Luanne Bras M.D.   On: 11/23/2018 16:31   Ir Angio Vertebral Sel Subclavian Innominate Uni L Mod Sed  Result Date: 11/27/2018 CLINICAL DATA:  Recurrence of vertebrobasilar ischemic symptoms of dizziness vertigo, and transient blindness and double vision as per patient and also her daughter. Duration of symptoms approximately 6 months. EXAM: BILATERAL COMMON CAROTID AND INNOMINATE ANGIOGRAPHY; IR ULTRASOUND GUIDANCE VASC ACCESS RIGHT; IR ANGIO VERTEBRAL SEL SUBCLAVIAN INNOMINATE UNI LEFT MOD SED; IR ANGIO VERTEBRAL SEL VERTEBRAL UNI RIGHT MOD SED COMPARISON:  MRA of the brain of September 23. MEDICATIONS: Heparin 2000 units IA. No antibiotic was administered within 1 hour of the procedure. ANESTHESIA/SEDATION: Versed 1 mg IV; Fentanyl 25 mcg IV Moderate Sedation Time:  49 minutes The patient was continuously monitored during the procedure by the interventional radiology nurse under my direct supervision. CONTRAST:  Isovue 300 approximately 60 mL. FLUOROSCOPY TIME:  Fluoroscopy Time: 17 minutes 24 seconds (965 mGy). COMPLICATIONS: None immediate. TECHNIQUE: Informed written consent was obtained from the patient after a thorough discussion of the procedural risks, benefits and alternatives. All questions were addressed. Maximal Sterile Barrier Technique was utilized including caps, mask, sterile gowns, sterile gloves, sterile drape, hand hygiene and skin antiseptic. A timeout was performed prior to the initiation of the procedure. The right forearm to the right wrist was prepped and draped in the usual sterile fashion. The right radial artery was then palpated and a dorsal palmar anastomosis was verified. Right radial artery was then identified with ultrasound and its morphology was then documented. Using ultrasound guidance, transradial access was then obtained with a micropuncture needle. Over a 0.018 inch micro guidewire, a 4/5 French radial sheath was then inserted without difficulty. The obturator and the micro  guidewire were then removed. Good aspiration was obtained from the side port of the radial sheath. A cocktail of 2.5 mg of Versed, 200 mcg of nitroglycerin, and 2000 units of heparin were then injected through the radial sheath in diluted form without difficulty. A right radial arteriogram was then obtained. Over a 0.035 inch Roadrunner guidewire, a 5 [place] Simmons 2 diagnostic catheter was then advanced to the aortic arch region, and selectively positioned in the right vertebral artery, the right common carotid artery, the left common carotid artery and the left subclavian artery proximal to the orifice of the left vertebral artery. At the end of the procedure, the right radial puncture site was then closed with a right wrist band for hemostasis. The distal right radial pulse was verified to be present at the end of the procedure. FINDINGS: The origin the right vertebral artery is widely patent. There is mild to moderate tortuosity just distal to the origin of the right vertebral artery. More distally the vessel is seen to opacify to the cranial skull base. Wide patency is seen of the right vertebrobasilar junction and the right posterior-inferior cerebellar artery. The opacified portion of the basilar artery, the superior cerebellar arteries and the anterior-inferior cerebellar arteries is seen into the capillary and venous phases. There is approximately 50% stenosis of the right posterior cerebral artery P1 segment and also of the left posterior cerebral artery distal P1 segment. Unopacified blood is seen in the basilar artery from the contralateral vertebral artery. The right common carotid arteriogram demonstrates the right external carotid artery and its major branches to be widely patent. The right internal carotid artery at the bulb to the cranial skull base demonstrates wide patency.  There is a double U-shaped configuration of the mid right ICA without evidence of kinking. The petrous segment is widely  patent. There is approximately 70% stenosis at the petrous cavernous junction. Distal to this the cavernous and the right internal carotid artery supraclinoid segments are widely patent. The right middle cerebral artery opacifies into the capillary and venous phases. There is no opacification of the right anterior cerebral A1 segment. The left common carotid arteriogram demonstrates the left external carotid artery and its major branches to be widely patent. The left internal carotid artery at the bulb has approximately 30% stenosis secondary to a smooth lateral soft plaque. No evidence of intraluminal filling defects or of ulcerations is seen. The vessel is seen to opacify to the cranial skull base. There is again approximately 40-50% stenosis of the cavernous segment of the left internal carotid artery. 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 The left middle cerebral artery opacifies into the capillary and venous phases. The left anterior cerebral artery opacifies also into the capillary and venous phases. There is prompt cross filling via the anterior communicating artery of the right anterior cerebral A2 segment and distally. The dominant left vertebral artery origin is widely patent. The vessel is seen to opacify to the cranial skull base. Just proximal to the left posterior-inferior cerebellar artery there is a severe focal narrowing of the left vertebrobasilar junction secondary to a smooth plaque with stenosis on the lateral projection of approximately 85%, and of approximately 65% on the AP projection. Distal to this the left posterior-inferior cerebellar artery and the left vertebrobasilar junction are widely patent. The opacified portion of the basilar artery, the posterior cerebral arteries, the superior cerebellar arteries and the anterior-inferior cerebellar arteries demonstrates  patency into the capillary and venous phases. 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 Approximately 50% stenosis of the right internal carotid artery at the petrous cavernous junction region. Approximately 30% stenosis of the left internal carotid artery at the bulb. Approximately 40-50% stenosis of the cavernous segment of the left internal carotid artery. 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 PLAN: Angio findings were reviewed with the patient and the patient's daughter. 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 Also the symptoms of vertebrobasilar ischemia over the past 6 months have recurred despite the patient taking aspirin, and Plavix with regular compliance. Patient advised to return to the clinic in the next few days regarding further discussion regarding management of these findings. Electronically Signed   By: Luanne Bras M.D.   On: 11/23/2018 16:31   Ir Angio Vertebral Sel Vertebral Uni R Mod Sed  Result Date: 11/27/2018 CLINICAL DATA:  Recurrence of vertebrobasilar ischemic symptoms of dizziness vertigo, and transient blindness and double vision as per patient and also her daughter. Duration of symptoms approximately 6 months. EXAM: BILATERAL COMMON CAROTID AND INNOMINATE ANGIOGRAPHY; IR ULTRASOUND GUIDANCE VASC ACCESS RIGHT; IR ANGIO VERTEBRAL SEL SUBCLAVIAN INNOMINATE UNI LEFT MOD SED; IR ANGIO VERTEBRAL SEL VERTEBRAL UNI RIGHT MOD  SED COMPARISON:  MRA of the brain of  September 23. MEDICATIONS: Heparin 2000 units IA. No antibiotic was administered within 1 hour of the procedure. ANESTHESIA/SEDATION: Versed 1 mg IV; Fentanyl 25 mcg IV Moderate Sedation Time:  49 minutes The patient was continuously monitored during the procedure by the interventional radiology nurse under my direct supervision. CONTRAST:  Isovue 300 approximately 60 mL. FLUOROSCOPY TIME:  Fluoroscopy Time: 17 minutes 24 seconds (965 mGy). COMPLICATIONS: None immediate. TECHNIQUE: Informed written consent was obtained from the patient after a thorough discussion of the procedural risks, benefits and alternatives. All questions were addressed. Maximal Sterile Barrier Technique was utilized including caps, mask, sterile gowns, sterile gloves, sterile drape, hand hygiene and skin antiseptic. A timeout was performed prior to the initiation of the procedure. The right forearm to the right wrist was prepped and draped in the usual sterile fashion. The right radial artery was then palpated and a dorsal palmar anastomosis was verified. Right radial artery was then identified with ultrasound and its morphology was then documented. Using ultrasound guidance, transradial access was then obtained with a micropuncture needle. Over a 0.018 inch micro guidewire, a 4/5 French radial sheath was then inserted without difficulty. The obturator and the micro guidewire were then removed. Good aspiration was obtained from the side port of the radial sheath. A cocktail of 2.5 mg of Versed, 200 mcg of nitroglycerin, and 2000 units of heparin were then injected through the radial sheath in diluted form without difficulty. A right radial arteriogram was then obtained. Over a 0.035 inch Roadrunner guidewire, a 5 [place] Simmons 2 diagnostic catheter was then advanced to the aortic arch region, and selectively positioned in the right vertebral artery, the right common carotid artery, the left common carotid  artery and the left subclavian artery proximal to the orifice of the left vertebral artery. At the end of the procedure, the right radial puncture site was then closed with a right wrist band for hemostasis. The distal right radial pulse was verified to be present at the end of the procedure. FINDINGS: The origin the right vertebral artery is widely patent. There is mild to moderate tortuosity just distal to the origin of the right vertebral artery. More distally the vessel is seen to opacify to the cranial skull base. Wide patency is seen of the right vertebrobasilar junction and the right posterior-inferior cerebellar artery. The opacified portion of the basilar artery, the superior cerebellar arteries and the anterior-inferior cerebellar arteries is seen into the capillary and venous phases. There is approximately 50% stenosis of the right posterior cerebral artery P1 segment and also of the left posterior cerebral artery distal P1 segment. Unopacified blood is seen in the basilar artery from the contralateral vertebral artery. The right common carotid arteriogram demonstrates the right external carotid artery and its major branches to be widely patent. The right internal carotid artery at the bulb to the cranial skull base demonstrates wide patency. There is a double U-shaped configuration of the mid right ICA without evidence of kinking. The petrous segment is widely patent. There is approximately 70% stenosis at the petrous cavernous junction. Distal to this the cavernous and the right internal carotid artery supraclinoid segments are widely patent. The right middle cerebral artery opacifies into the capillary and venous phases. There is no opacification of the right anterior cerebral A1 segment. The left common carotid arteriogram demonstrates the left external carotid artery and its major branches to be widely patent. The left internal carotid artery at the bulb has approximately 30% stenosis secondary to a  smooth  lateral soft plaque. No evidence of intraluminal filling defects or of ulcerations is seen. The vessel is seen to opacify to the cranial skull base. There is again approximately 40-50% stenosis of the cavernous segment of the left internal carotid artery. 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 The left middle cerebral artery opacifies into the capillary and venous phases. The left anterior cerebral artery opacifies also into the capillary and venous phases. There is prompt cross filling via the anterior communicating artery of the right anterior cerebral A2 segment and distally. The dominant left vertebral artery origin is widely patent. The vessel is seen to opacify to the cranial skull base. Just proximal to the left posterior-inferior cerebellar artery there is a severe focal narrowing of the left vertebrobasilar junction secondary to a smooth plaque with stenosis on the lateral projection of approximately 85%, and of approximately 65% on the AP projection. Distal to this the left posterior-inferior cerebellar artery and the left vertebrobasilar junction are widely patent. The opacified portion of the basilar artery, the posterior cerebral arteries, the superior cerebellar arteries and the anterior-inferior cerebellar arteries demonstrates patency into the capillary and venous phases. 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 Approximately 50% stenosis of the right internal carotid artery at the petrous cavernous junction region. Approximately 30% stenosis of the left internal carotid artery at the bulb. Approximately 40-50% stenosis of the cavernous segment of the left internal carotid artery. 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 PLAN: Angio findings were reviewed with the patient and the patient's daughter. 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 Also the symptoms of vertebrobasilar ischemia over the past 6 months have recurred despite the patient taking aspirin, and Plavix with regular compliance. Patient advised to return to the clinic in the next few days regarding further discussion regarding management of these findings. Electronically Signed   By: Luanne Bras M.D.   On: 11/23/2018 16:31    Labs:  CBC: Recent Labs    11/23/18 1036 12/10/18 0706  WBC 5.2 5.0  HGB 14.1 14.4  HCT 43.0 45.0  PLT 255 247    COAGS: Recent Labs    11/23/18 1036 12/10/18 0706  INR 1.1 1.0  APTT  --  26    BMP: Recent Labs    11/23/18 1036  NA 142  K 4.3  CL 107  CO2 28  GLUCOSE 100*  BUN 13  CALCIUM 9.3  CREATININE 0.95  GFRNONAA >60  GFRAA >60    LIVER FUNCTION TESTS: No results for input(s): BILITOT, AST, ALT, ALKPHOS, PROT, ALBUMIN in the last 8760 hours.  TUMOR MARKERS: No results for input(s): AFPTM, CEA, CA199, CHROMGRNA in the last 8760 hours.  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   Case reviewed by Dr. Estanislado Pandy who approves patient for procedure.  Patient presents today in their usual state of health.  She has been NPO and is not currently  on blood thinners.  She did take her Plavix this AM, but no aspirin.  She is  to be dosed 325 mg aspirin.  Labs pending at this time.  P2Y12 re-drawn due to error.  Pending outcome of her labwork this AM, plan to proceed today.   Risks and benefits of cerebral angiogram with intervention were discussed with the patient including, but not limited to bleeding, infection, vascular injury, contrast induced renal failure, stroke or even death.  This interventional procedure involves the use of X-rays and because of the nature of the planned procedure, it is possible that we will have prolonged use of X-ray fluoroscopy.  Potential radiation risks to you include (but are not limited to) the following: - A slightly elevated risk for cancer  several years later in life. This risk is typically less than 0.5% percent. This risk is low in comparison to the normal incidence of human cancer, which is 33% for women and 50% for men according to the [employer]. - Radiation induced injury can include skin redness, resembling a rash, tissue breakdown / ulcers and hair loss (which can be temporary or permanent).   The likelihood of either of these occurring depends on the difficulty of the procedure and whether you are sensitive to radiation due to previous procedures, disease, or genetic conditions.   IF your procedure requires a prolonged use of radiation, you will be notified and given written instructions for further action.  It is your responsibility to monitor the irradiated area for the 2 weeks following the procedure and to notify your physician if you are concerned that you have suffered a radiation induced injury.    All of the patient's questions were answered, patient is agreeable to proceed.  Consent signed and in chart.  Thank you for this interesting consult.  I greatly enjoyed meeting Vicki Page and look forward to participating in their care.  A copy of this report was sent to the requesting provider on this date.  Electronically Signed: Docia Barrier, PA 12/10/2018, 8:30 AM   I spent a total of    25 Minutes in face to face in clinical consultation, greater than 50% of which was counseling/coordinating care for vertebrobasilar junction stenosis.

## 2018-12-11 ENCOUNTER — Encounter (HOSPITAL_COMMUNITY): Payer: Self-pay | Admitting: Interventional Radiology

## 2018-12-11 LAB — POCT ACTIVATED CLOTTING TIME: Activated Clotting Time: 191 seconds

## 2018-12-12 ENCOUNTER — Encounter (HOSPITAL_COMMUNITY): Payer: Self-pay | Admitting: Interventional Radiology

## 2018-12-12 NOTE — Anesthesia Postprocedure Evaluation (Signed)
Anesthesia Post Note  Patient: Vicki Page  Procedure(s) Performed: STENTING (N/A )     Patient location during evaluation: PACU Anesthesia Type: General Level of consciousness: awake and alert Pain management: pain level controlled Vital Signs Assessment: post-procedure vital signs reviewed and stable Respiratory status: spontaneous breathing, nonlabored ventilation, respiratory function stable and patient connected to nasal cannula oxygen Cardiovascular status: blood pressure returned to baseline and stable Postop Assessment: no apparent nausea or vomiting Anesthetic complications: no    Last Vitals:  Vitals:   12/10/18 1626 12/10/18 1630  BP: (!) 143/73   Pulse: 67   Resp: 10   Temp:  36.7 C  SpO2: 98%     Last Pain:  Vitals:   12/10/18 1630  PainSc: 0-No pain                 Tiajuana Amass

## 2019-04-25 ENCOUNTER — Encounter: Payer: Self-pay | Admitting: Gastroenterology

## 2019-04-25 ENCOUNTER — Ambulatory Visit (INDEPENDENT_AMBULATORY_CARE_PROVIDER_SITE_OTHER): Payer: Medicare HMO | Admitting: Gastroenterology

## 2019-04-25 VITALS — BP 120/58 | HR 63 | Temp 98.2°F | Ht 62.0 in | Wt 153.0 lb

## 2019-04-25 DIAGNOSIS — R438 Other disturbances of smell and taste: Secondary | ICD-10-CM | POA: Diagnosis not present

## 2019-04-25 DIAGNOSIS — K143 Hypertrophy of tongue papillae: Secondary | ICD-10-CM

## 2019-04-25 DIAGNOSIS — R110 Nausea: Secondary | ICD-10-CM

## 2019-04-25 MED ORDER — NYSTATIN 100000 UNIT/ML MT SUSP: OROMUCOSAL | 0 refills | Status: DC

## 2019-04-25 MED ORDER — PANTOPRAZOLE SODIUM 40 MG PO TBEC: 40.0000 mg | DELAYED_RELEASE_TABLET | Freq: Two times a day (BID) | ORAL | 3 refills | Status: DC

## 2019-04-25 NOTE — Progress Notes (Signed)
04/25/2019 Vicki Page [id number] 1950-11-05   HISTORY OF PRESENT ILLNESS: This is a pleasant 69 year old female who is a patient of Dr. Vena Rua.  She has a history of reflux and Barrett's esophagus.  Her last EGD was in November 2019 at which time she was found to have irregular Z-line where biopsies were obtained and showed Barrett's esophagus with no sign of dysplasia.  Esophagus was dilated due to complaints of dysphagia.  Repeat EGD was recommended a 3-year interval.  She presents here today with complaints of nausea every morning upon waking.  She says that does seem to get better some throughout the day, but has been occurring every morning for about the past 4-5 months.  She says that this has been accompanied by a terrible taste in her mouth.  She says that she will brush her teeth several times to try to get this taste out of her mouth.  She does note a brown coating on her tongue that she tries to brush off.  She is currently on pantoprazole 40 mg daily for her reflux related issues.  She denies any complaints of abdominal pain.   Past Medical History:  Diagnosis Date  . Adenomatous colon polyp   . 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 Anxiety   . Arthritis   . Atherosclerosis   . Barrett's esophagus   . Carotid artery occlusion   . Cataracts, bilateral    just watching  . DDD (degenerative disc disease), lumbar   . Depression   . Diverticulosis   . DVT (deep venous thrombosis) (HCC)    left leg  . Fall at home May 30, 2014   Pt slipped in tub- hurt left hip and right shoulder  . GERD (gastroesophageal reflux disease)   . H. pylori infection   . Hyperlipidemia   . Hypertension   . Hypothyroidism   . IBS (irritable bowel syndrome)   . Osteoarthritis   . Pre-diabetes    no meds, diet controlled  . RLS (restless legs syndrome)   . Tubular adenoma of colon   . Ulcer of the stomach and intestine    Past Surgical History:  Procedure Laterality Date  . ANAL RECTAL  MANOMETRY N/A 05/04/2016   Procedure: ANO RECTAL MANOMETRY;  Surgeon: Mauri Pole, MD;  Location: [facility];  Service: Endoscopy;  Laterality: N/A;  . ANEURYSM COILING  July 26, 2010   stent  . BRAIN SURGERY     Per patient " Left side behind eyes  . INSERTION OF ILIAC STENT  10/04/2012   Procedure: INSERTION OF ILIAC STENT;  Surgeon: Jettie Booze, MD;  Location: [facility] CATH LAB;  Service: Cardiovascular;;  Left External Iliac Artery  . IR ANGIO INTRA EXTRACRAN SEL COM CAROTID INNOMINATE BILAT MOD SED  11/23/2018  . IR ANGIO VERTEBRAL SEL SUBCLAVIAN INNOMINATE UNI L MOD SED  11/23/2018  . IR ANGIO VERTEBRAL SEL VERTEBRAL UNI L MOD SED  12/10/2018  . IR ANGIO VERTEBRAL SEL VERTEBRAL UNI R MOD SED  11/23/2018  . IR GENERIC HISTORICAL  11/03/2015   IR ANGIO VERTEBRAL SEL VERTEBRAL UNI L MOD SED 11/03/2015 Luanne Bras, MD [facility]  . IR GENERIC HISTORICAL  11/03/2015   IR ANGIO INTRA EXTRACRAN SEL COM CAROTID INNOMINATE BILAT MOD SED 11/03/2015 Luanne Bras, MD [facility]  . IR GENERIC HISTORICAL  11/03/2015   IR ANGIO VERTEBRAL SEL SUBCLAVIAN INNOMINATE UNI R MOD SED 11/03/2015 Luanne Bras, MD [facility]  .  IR GENERIC HISTORICAL  11/19/2015   IR RADIOLOGIST EVAL & MGMT 11/19/2015 [facility]  . IR US GUIDE VASC ACCESS RIGHT  11/23/2018  . LEG SURGERY    . LOWER EXTREMITY ANGIOGRAM Left 10-04-12   and Left iliac stent  . LOWER EXTREMITY ANGIOGRAM N/A 10/04/2012   Procedure: LOWER EXTREMITY ANGIOGRAM;  Surgeon: Jettie Booze, MD;  Location: [facility] CATH LAB;  Service: Cardiovascular;  Laterality: N/A;  . RADIOLOGY WITH ANESTHESIA N/A 12/10/2018   Procedure: STENTING;  Surgeon: Luanne Bras, MD;  Location: [place];  Service: Radiology;  Laterality: N/A;  . TUBAL LIGATION      reports that she has been smoking cigarettes. She has been smoking about 0.25 packs per day. She has never used smokeless tobacco. She reports that she does not drink alcohol or use  drugs. family history includes COPD in her brother; Colonic polyp in her brother; Deep vein thrombosis in her mother; Diabetes in her brother and mother; Heart attack in her brother, father, and mother; Heart disease in her brother, father, and mother; Hyperlipidemia in her brother, father, and mother; Hypertension in her brother, father, and mother; Stroke in her mother; Thyroid disease in her mother. Allergies  Allergen Reactions  . Ropinirole Nausea And Vomiting  . Codeine Nausea And Vomiting  . Darvon [Propoxyphene Hcl] Nausea And Vomiting  . Flagyl [Metronidazole] Nausea And Vomiting      Outpatient Encounter Medications as of 04/25/2019  Medication Sig  . acetaminophen (TYLENOL) 500 MG tablet Take 1,000 mg by mouth 2 (two) times daily as needed for pain.   Marland Kitchen amLODipine (NORVASC) 10 MG tablet Take 10 mg by mouth daily. for high blood pressure  . aspirin EC 81 MG tablet Take 81 mg by mouth daily.  Marland Kitchen buPROPion (WELLBUTRIN SR) 150 MG 12 hr tablet Take 150 mg by mouth 2 (two) times daily.  . Calcium-Vitamin D (CALTRATE 600 PLUS-VIT D PO) Take 1 tablet by mouth daily before supper.   . Cholecalciferol (VITAMIN D) 2000 UNITS CAPS Take 2,000 Units by mouth daily before supper.   . cilostazol (PLETAL) 50 MG tablet Take 50 mg by mouth 2 (two) times daily.  . clopidogrel (PLAVIX) 75 MG tablet Take 1 tablet (75 mg total) by mouth daily with breakfast.  . hydrochlorothiazide (MICROZIDE) 12.5 MG capsule Take 12.5 mg by mouth daily.  Marland Kitchen levothyroxine (SYNTHROID, LEVOTHROID) 25 MCG tablet Take 25 mcg by mouth daily before breakfast.   . Magnesium 250 MG TABS Take 250 mg by mouth daily before supper.  . Multiple Vitamin (MULTIVITAMIN WITH MINERALS) TABS tablet Take 1 tablet by mouth daily before supper. Centrum Silver   . pantoprazole (PROTONIX) 40 MG tablet Take 40 mg by mouth daily.  . potassium gluconate 595 MG TABS Take 595 mg by mouth daily before supper.   . Probiotic Product (PROBIOTIC PO) Take  1 tablet by mouth daily after lunch.   . Psyllium (METAMUCIL) 28.3 % POWD Take 1 Scoop by mouth daily.  . simvastatin (ZOCOR) 80 MG tablet Take 80 mg by mouth daily with supper.   . vitamin B-12 (CYANOCOBALAMIN) 500 MCG tablet Take 500 mcg by mouth daily.   No facility-administered encounter medications on file as of 04/25/2019.     REVIEW OF SYSTEMS  : All other systems reviewed and negative except where noted in the History of Present Illness.   PHYSICAL EXAM: BP (!) 120/58   Pulse 63   Temp 98.2 F (36.8 C)   Ht 5\' 2"  (  1.575 m)   Wt 153 lb (69.4 kg)   LMP  (LMP Unknown)   BMI 27.98 kg/m  General: Well developed AA female in no acute distress Head: Normocephalic and atraumatic Eyes:  Sclerae anicteric, conjunctiva pink. Ears: Normal auditory acuity Mouth:  Brown coating noted on her tongue. Lungs: Clear throughout to auscultation; no increased WOB. Heart: Regular rate and rhythm; no M/R/G. Abdomen: Soft, non-distended.  BS present.  Non-tender. Musculoskeletal: Symmetrical with no gross deformities  Skin: No lesions on visible extremities Extremities: No edema  Neurological: Alert oriented x 4, grossly non-focal Psychological:  Alert and cooperative. Normal mood and affect  ASSESSMENT AND PLAN: *69 year old female with history of GERD and Barrett's esophagus with complaints of morning nausea accompanied by a bad taste in her mouth for the past 4 to 5 months.  Currently on pantoprazole 40 mg daily each morning.  I would like her to increase this to twice daily, adding a second dose with her evening meal.  New prescription was sent.  She also has a brown coating on her tongue.  Not typical appearance of Candida obviously, but I am going to have her try some nystatin swish and swallow 3 times daily for the next 10 days or so to see if this helps.  Prescription for this was sent as well.  Have asked her to call us back in about 2 weeks to give [place] an update on her symptoms.   CC:   Caren Macadam, MD

## 2019-04-25 NOTE — Patient Instructions (Addendum)
If you are age 69 or older, your body mass index should be between 23-30. Your Body mass index is 27.98 kg/m. If this is out of the aforementioned range listed, please consider follow up with your Primary Care Provider.  If you are age 61 or younger, your body mass index should be between 19-25. Your Body mass index is 27.98 kg/m. If this is out of the aformentioned range listed, please consider follow up with your Primary Care Provider.   Pantoprazole 40 mg twice daily.  Nystatin swish and swallow 5 ml three times daily for 10 days.   Call back in 2 weeks and ask for Koren Shiver with an update.

## 2019-04-26 ENCOUNTER — Encounter: Payer: Self-pay | Admitting: Gastroenterology

## 2019-04-26 DIAGNOSIS — R110 Nausea: Secondary | ICD-10-CM | POA: Insufficient documentation

## 2019-04-26 DIAGNOSIS — K143 Hypertrophy of tongue papillae: Secondary | ICD-10-CM | POA: Insufficient documentation

## 2019-04-26 DIAGNOSIS — R438 Other disturbances of smell and taste: Secondary | ICD-10-CM | POA: Insufficient documentation

## 2019-04-27 NOTE — Progress Notes (Signed)
Addendum: Reviewed and agree with assessment and management plan. Nicolle Heward M, MD  

## 2019-05-21 ENCOUNTER — Telehealth: Payer: Self-pay | Admitting: Gastroenterology

## 2019-05-21 NOTE — Telephone Encounter (Signed)
Left message on machine to call back  

## 2019-05-21 NOTE — Telephone Encounter (Signed)
Patient returned your call, please call patient one more time.   

## 2019-05-21 NOTE — Telephone Encounter (Signed)
The pt states that she has tried the nystatin and it has not resolved the issue of the nausea, pain in the throat or the bad taste in the mouth.  Please advise

## 2019-05-22 DIAGNOSIS — M7742 Metatarsalgia, left foot: Secondary | ICD-10-CM | POA: Insufficient documentation

## 2019-05-22 DIAGNOSIS — M7741 Metatarsalgia, right foot: Secondary | ICD-10-CM | POA: Insufficient documentation

## 2019-05-22 DIAGNOSIS — M2040 Other hammer toe(s) (acquired), unspecified foot: Secondary | ICD-10-CM | POA: Insufficient documentation

## 2019-05-22 MED ORDER — FAMOTIDINE 20 MG PO TABS: 20.0000 mg | ORAL_TABLET | Freq: Every day | ORAL | 0 refills | Status: DC

## 2019-05-22 MED ORDER — FLUCONAZOLE 100 MG PO TABS: ORAL_TABLET | ORAL | 0 refills | Status: AC

## 2019-05-22 NOTE — Telephone Encounter (Signed)
Prescriptions sent to the pharmacy as requested.  Pt aware and will call back in 2 weeks with an update.  She did confirm that she is taking pantoprazole 40 mg BID.

## 2019-05-22 NOTE — Telephone Encounter (Signed)
Please be sure that she is taking her pantoprazole 40 mg twice daily, in the morning and in the evening.  Also  add Pepcid 20 mg at bedtime (can send prescription).  We can try a course of Diflucan instead of the nystatin to see if this helps more if it is a yeast/Candida issue.  Please prescribe Diflucan 200 mg for day 1 and then 100 mg for 13 more days for total of 14 days of treatment if patient is willing.  Then have her call back in 2 weeks or so with another update.

## 2019-05-23 ENCOUNTER — Telehealth: Payer: Self-pay | Admitting: Gastroenterology

## 2019-05-23 NOTE — Telephone Encounter (Signed)
Patient called has question son new medications (Pepcid and Diflucan)

## 2019-05-23 NOTE — Telephone Encounter (Signed)
I spoke with the pt at length regarding why she was given diflucan.  (candida-yeast)  The pt thanked me for calling and verbalized understanding.

## 2019-05-24 ENCOUNTER — Telehealth: Payer: Self-pay | Admitting: Gastroenterology

## 2019-05-24 NOTE — Telephone Encounter (Signed)
The pt called and states she is unable to take diflucan it has caused her lips to swell and throat to feel funny.  She also says that she thinks she should call her PCP.  I advised that she is more than welcome to call her PCP and I will send a message to Janett Billow to review.

## 2019-05-24 NOTE — Telephone Encounter (Signed)
Pt states that medication that she was prescribed are giving her side effects, she feels that her lips are swelling and her throat does not feel fine. Pls call her.

## 2019-05-27 ENCOUNTER — Telehealth: Payer: Self-pay | Admitting: Gastroenterology

## 2019-05-27 NOTE — Telephone Encounter (Signed)
The pt is calling again to say that her mouth is still hurting and she continues to have a coating on her tongue.  I asked if she is still taking her diflucan that [facility] prescribed.  She tells me that she has not taken it.  She is very confused about the diflucan and why she is taking it.  I explained in detail that she has thrush "yeast" and that diflucan is the treatment.  She was advised to take the medications exactly as prescribed and her thrush should resolve.  I did advise that the meds will take time to work and if not taken exactly as prescribed the thrush will only get worse.  She tells me she understands and will take the medication as prescribed.  FYI Jess

## 2019-05-27 NOTE — Telephone Encounter (Signed)
Pt would like a call back asap. She reported that her whole mouth is inflamed, she wants to know if she should go to the ED or what she can do.

## 2019-05-28 NOTE — Telephone Encounter (Signed)
Thank you.  I was not 100% sure what was going on.  She had a brown coating on her tongue, usually thrush is white, but I figured we would give it a try to see if it helps.  She may need to see ENT or even her dentist about this issue especially if the Diflucan does not help.

## 2019-06-10 ENCOUNTER — Other Ambulatory Visit (HOSPITAL_COMMUNITY): Payer: Self-pay | Admitting: Interventional Radiology

## 2019-06-10 ENCOUNTER — Telehealth: Payer: Self-pay | Admitting: Gastroenterology

## 2019-06-10 DIAGNOSIS — I771 Stricture of artery: Secondary | ICD-10-CM

## 2019-06-10 NOTE — Telephone Encounter (Signed)
Patient called states she is out of Pepcid medication and she was not sure if she is to continue taking it.

## 2019-06-10 NOTE — Telephone Encounter (Signed)
The pt was advised to continue her meds (pepcid)as prescribed.  She is to call back if she has any concerns.

## 2019-06-13 ENCOUNTER — Other Ambulatory Visit: Payer: Self-pay | Admitting: *Deleted

## 2019-06-13 DIAGNOSIS — I739 Peripheral vascular disease, unspecified: Secondary | ICD-10-CM

## 2019-06-13 DIAGNOSIS — I6529 Occlusion and stenosis of unspecified carotid artery: Secondary | ICD-10-CM

## 2019-06-14 ENCOUNTER — Telehealth (HOSPITAL_COMMUNITY): Payer: Self-pay

## 2019-06-14 ENCOUNTER — Other Ambulatory Visit: Payer: Self-pay | Admitting: Gastroenterology

## 2019-06-14 NOTE — Telephone Encounter (Signed)

## 2019-06-17 ENCOUNTER — Encounter: Payer: Self-pay | Admitting: Surgery

## 2019-06-17 ENCOUNTER — Ambulatory Visit (INDEPENDENT_AMBULATORY_CARE_PROVIDER_SITE_OTHER): Payer: Medicare HMO | Admitting: Surgery

## 2019-06-17 ENCOUNTER — Ambulatory Visit (HOSPITAL_COMMUNITY)
Admission: RE | Admit: 2019-06-17 | Discharge: 2019-06-17 | Disposition: A | Discharge status: Home / Self Care | Payer: Medicare HMO | Source: Ambulatory Visit | Attending: Surgery | Admitting: Surgery

## 2019-06-17 ENCOUNTER — Ambulatory Visit (INDEPENDENT_AMBULATORY_CARE_PROVIDER_SITE_OTHER)
Admission: RE | Admit: 2019-06-17 | Discharge: 2019-06-17 | Disposition: A | Discharge status: Home / Self Care | Payer: Medicare HMO | Source: Ambulatory Visit | Attending: Surgery | Admitting: Surgery

## 2019-06-17 ENCOUNTER — Other Ambulatory Visit: Payer: Self-pay

## 2019-06-17 VITALS — BP 128/71 | HR 74 | Temp 97.2°F | Resp 20 | Ht 62.0 in | Wt 151.0 lb

## 2019-06-17 DIAGNOSIS — I6523 Occlusion and stenosis of bilateral carotid arteries: Secondary | ICD-10-CM

## 2019-06-17 DIAGNOSIS — I6529 Occlusion and stenosis of unspecified carotid artery: Secondary | ICD-10-CM | POA: Diagnosis not present

## 2019-06-17 DIAGNOSIS — I739 Peripheral vascular disease, unspecified: Secondary | ICD-10-CM | POA: Insufficient documentation

## 2019-06-17 DIAGNOSIS — I70213 Atherosclerosis of native arteries of extremities with intermittent claudication, bilateral legs: Secondary | ICD-10-CM | POA: Diagnosis not present

## 2019-06-17 NOTE — Progress Notes (Signed)
[facility] [place]  Patient name: Vicki Page MRN: [id number] DOB: 21-Mar-1950 Sex: female   REASON FOR VISIT:    Follow up  HISOTRY OF PRESENT ILLNESS:    Vicki Page is a 69 y.o. female, who is known to me, having been evaluated for carotid stenosis many years ago.  She was most recently seen in 2017 where a carotid duplex showed less than 40% stenosis bilaterally.  Patient has a history of intracranial aneurysms.  The left side has been treated with embolization and stent placement in 2012.  She is being monitored for the right side.  The patient has also undergone left leg stenting by Dr. Irish Page.  She complains of leg pain when she walks a long ways.  She states that she will also wake up at night sometimes with leg pain.  The patient is medically managed for hypertension.  She takes a statin for hypercholesterolemia.  She is on dual antiplatelet therapy.   PAST MEDICAL HISTORY:   Past Medical History:  Diagnosis Date  . Adenomatous colon polyp   . 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 Anxiety   . Arthritis   . Atherosclerosis   . Barrett's esophagus   . Carotid artery occlusion   . Cataracts, bilateral    just watching  . DDD (degenerative disc disease), lumbar   . Depression   . Diverticulosis   . DVT (deep venous thrombosis) (HCC)    left leg  . Fall at home May 30, 2014   Pt slipped in tub- hurt left hip and right shoulder  . GERD (gastroesophageal reflux disease)   . H. pylori infection   . Hyperlipidemia   . Hypertension   . Hypothyroidism   . IBS (irritable bowel syndrome)   . Osteoarthritis   . Pre-diabetes    no meds, diet controlled  . RLS (restless legs syndrome)   . Tubular adenoma of colon   . Ulcer of the stomach and intestine      FAMILY HISTORY:   Family History  Problem Relation Age of Onset  . Deep vein thrombosis Mother   . Diabetes Mother        Amputation  . Heart  disease Mother        Heart Disease before age 31  . Hyperlipidemia Mother   . Hypertension Mother   . Heart attack Mother   . Stroke Mother   . Thyroid disease Mother   . Heart disease Father        Heart Disease before age 51  . Hypertension Father   . Heart attack Father   . Hyperlipidemia Father   . Heart disease Brother        Heart Disease before age 52  . Hyperlipidemia Brother   . Hypertension Brother   . Heart attack Brother   . COPD Brother   . Diabetes Brother   . Colonic polyp Brother   . Colon cancer Neg Hx   . Stomach cancer Neg Hx     SOCIAL HISTORY:   Social History   Tobacco Use  . Smoking status: Light Tobacco Smoker    Packs/day: 0.25    Types: Cigarettes  . Smokeless tobacco: Never Used  . Tobacco comment: 7 or more depending on the day  Substance Use Topics  . Alcohol use: No    Alcohol/week: 0.0 standard drinks     ALLERGIES:   Allergies  Allergen Reactions  . Ropinirole  Nausea And Vomiting  . Codeine Nausea And Vomiting  . Darvon [Propoxyphene Hcl] Nausea And Vomiting  . Flagyl [Metronidazole] Nausea And Vomiting     CURRENT MEDICATIONS:   Current Outpatient Medications  Medication Sig Dispense Refill  . acetaminophen (TYLENOL) 500 MG tablet Take 1,000 mg by mouth 2 (two) times daily as needed for pain.     Marland Kitchen amLODipine (NORVASC) 10 MG tablet Take 10 mg by mouth daily. for high blood pressure    . aspirin EC 81 MG tablet Take 81 mg by mouth daily.    Marland Kitchen buPROPion (WELLBUTRIN SR) 150 MG 12 hr tablet Take 150 mg by mouth 2 (two) times daily.    . Calcium-Vitamin D (CALTRATE 600 PLUS-VIT D PO) Take 1 tablet by mouth daily before supper.     . Cholecalciferol (VITAMIN D) 2000 UNITS CAPS Take 2,000 Units by mouth daily before supper.     . cilostazol (PLETAL) 50 MG tablet Take 50 mg by mouth 2 (two) times daily.    . clopidogrel (PLAVIX) 75 MG tablet Take 1 tablet (75 mg total) by mouth daily with breakfast. 30 tablet 2  . diclofenac  Sodium (VOLTAREN) 1 % GEL diclofenac 1 % topical gel  APPLY 2 GRAMS TO THE AFFECTED AREA(S) BY TOPICAL ROUTE 4 TIMES PER DAY FOR 30 DAYS    . famotidine (PEPCID) 20 MG tablet TAKE 1 TABLET BY MOUTH EVERYDAY AT BEDTIME 30 tablet 3  . hydrochlorothiazide (MICROZIDE) 12.5 MG capsule Take 12.5 mg by mouth daily.    Marland Kitchen levothyroxine (SYNTHROID, LEVOTHROID) 25 MCG tablet Take 25 mcg by mouth daily before breakfast.     . Magnesium 250 MG TABS Take 250 mg by mouth daily before supper.    . Multiple Vitamin (MULTIVITAMIN WITH MINERALS) TABS tablet Take 1 tablet by mouth daily before supper. Centrum Silver     . nystatin (MYCOSTATIN) 100000 UNIT/ML suspension Swish and swallow 5 ml three times daily. 200 mL 0  . pantoprazole (PROTONIX) 40 MG tablet Take 1 tablet (40 mg total) by mouth 2 (two) times daily before a meal. 60 tablet 3  . potassium gluconate 595 MG TABS Take 595 mg by mouth daily before supper.     . Probiotic Product (PROBIOTIC PO) Take 1 tablet by mouth daily after lunch.     . Psyllium (METAMUCIL) 28.3 % POWD Take 1 Scoop by mouth daily.    . simvastatin (ZOCOR) 80 MG tablet Take 80 mg by mouth daily with supper.     . vitamin B-12 (CYANOCOBALAMIN) 500 MCG tablet Take 500 mcg by mouth daily.     No current facility-administered medications for this visit.    REVIEW OF SYSTEMS:   [X]  denotes positive finding, [ ]  denotes negative finding Cardiac  Comments:  Chest pain or chest pressure:    Shortness of breath upon exertion:    Short of breath when lying flat:    Irregular heart rhythm:        Vascular    Pain in calf, thigh, or hip brought on by ambulation: x   Pain in feet at night that wakes you up from your sleep:  x   Blood clot in your veins:    Leg swelling:         Pulmonary    Oxygen at home:    Productive cough:     Wheezing:         Neurologic    Sudden weakness in arms or legs:  Sudden numbness in arms or legs:     Sudden onset of difficulty speaking or  slurred speech:    Temporary loss of vision in one eye:     Problems with dizziness:         Gastrointestinal    Blood in stool:     Vomited blood:         Genitourinary    Burning when urinating:     Blood in urine:        Psychiatric    Major depression:         Hematologic    Bleeding problems:    Problems with blood clotting too easily:        Skin    Rashes or ulcers:        Constitutional    Fever or chills:      PHYSICAL EXAM:   Vitals:   06/17/19 1346 06/17/19 1350  BP: 117/69 128/71  Pulse: 74   Resp: 20   Temp: (!) 97.2 F (36.2 C)   SpO2: 99%   Weight: 151 lb (68.5 kg)   Height: 5\' 2"  (1.575 m)     GENERAL: The patient is a well-nourished female, in no acute distress. The vital signs are documented above. CARDIAC: There is a regular rate and rhythm.  VASCULAR: Palpable dorsalis pedis pulse bilaterally PULMONARY: Non-labored respirations MUSCULOSKELETAL: There are no major deformities or cyanosis. NEUROLOGIC: No focal weakness or paresthesias are detected. SKIN: There are no ulcers or rashes noted. PSYCHIATRIC: The patient has a normal affect.  STUDIES:   I have reviewed the following:  ABI Findings:  +---------+------------------+-----+---------+--------+  Right  Rt Pressure (mmHg)IndexWaveform Comment   +---------+------------------+-----+---------+--------+  Brachial 147                      +---------+------------------+-----+---------+--------+  ATA   138        0.94            +---------+------------------+-----+---------+--------+  PTA   158        1.07 triphasic      +---------+------------------+-----+---------+--------+  DP                triphasic      +---------+------------------+-----+---------+--------+  Great Toe116        0.79            +---------+------------------+-----+---------+--------+    +---------+------------------+-----+---------+-------+  Left   Lt Pressure (mmHg)IndexWaveform Comment  +---------+------------------+-----+---------+-------+  Brachial 146                      +---------+------------------+-----+---------+-------+  ATA   143        0.97            +---------+------------------+-----+---------+-------+  PTA   157        1.07 triphasic      +---------+------------------+-----+---------+-------+  DP                triphasic      +---------+------------------+-----+---------+-------+  Theodoro [place]        0.77            +---------+------------------+-----+---------+-------+   +-------+-----------+-----------+------------+------------+  ABI/TBIToday's ABIToday's TBIPrevious ABIPrevious TBI  +-------+-----------+-----------+------------+------------+  Right 1.07    0.79    1.02    0.81      +-------+-----------+-----------+------------+------------+  Left  1.07    0.77    1.04    0.92      +-------+-----------+-----------+------------+------------+   CAROTID:  Right Carotid: Velocities in  the right ICA are consistent with a 1-39%  stenosis.   Left Carotid: Velocities in the left ICA are consistent with a 40-59%  stenosis.   Vertebrals: Bilateral vertebral arteries demonstrate antegrade flow.  Subclavians: Normal flow hemodynamics were seen in bilateral subclavian        arteries.   MEDICAL ISSUES:   Carotid: She has 40 to 59% stenosis on the left.  She is asymptomatic.  She will return in 1 year for follow-up  Lower extremity: She has palpable pulses and normal ABIs.  I do not think her leg complaints are related to vascular insufficiency.  She will follow-up in 1 year with repeat ultrasound.    Leia Alf, MD, FACS [facility]  [phone] Pager [phone]

## 2019-06-18 ENCOUNTER — Other Ambulatory Visit: Payer: Self-pay | Admitting: *Deleted

## 2019-06-18 DIAGNOSIS — I739 Peripheral vascular disease, unspecified: Secondary | ICD-10-CM

## 2019-06-18 DIAGNOSIS — I6523 Occlusion and stenosis of bilateral carotid arteries: Secondary | ICD-10-CM

## 2019-06-18 DIAGNOSIS — I70213 Atherosclerosis of native arteries of extremities with intermittent claudication, bilateral legs: Secondary | ICD-10-CM

## 2019-07-03 ENCOUNTER — Ambulatory Visit (HOSPITAL_COMMUNITY): Payer: Medicare HMO

## 2019-07-03 ENCOUNTER — Ambulatory Visit (HOSPITAL_COMMUNITY): Admission: RE | Admit: 2019-07-03 | Payer: Medicare HMO | Source: Ambulatory Visit

## 2019-07-24 ENCOUNTER — Telehealth (HOSPITAL_COMMUNITY): Payer: Self-pay

## 2019-07-24 NOTE — Telephone Encounter (Signed)
Called to remind pt of mri appt on 6/17 at 3pm. Pt agreed to arrive at 245pm. AW

## 2019-07-25 ENCOUNTER — Encounter (HOSPITAL_COMMUNITY): Payer: Self-pay

## 2019-07-25 ENCOUNTER — Ambulatory Visit (HOSPITAL_COMMUNITY)
Admission: RE | Admit: 2019-07-25 | Discharge: 2019-07-25 | Disposition: A | Discharge status: Home / Self Care | Payer: Medicare HMO | Source: Ambulatory Visit | Attending: Interventional Radiology | Admitting: Interventional Radiology

## 2019-07-25 ENCOUNTER — Other Ambulatory Visit: Payer: Self-pay

## 2019-07-25 ENCOUNTER — Ambulatory Visit (HOSPITAL_COMMUNITY): Payer: Medicare HMO

## 2019-07-25 DIAGNOSIS — I6502 Occlusion and stenosis of left vertebral artery: Secondary | ICD-10-CM | POA: Diagnosis not present

## 2019-07-25 DIAGNOSIS — I771 Stricture of artery: Secondary | ICD-10-CM | POA: Diagnosis present

## 2019-07-25 DIAGNOSIS — I671 Cerebral aneurysm, nonruptured: Secondary | ICD-10-CM | POA: Diagnosis not present

## 2019-07-30 ENCOUNTER — Other Ambulatory Visit (HOSPITAL_COMMUNITY): Payer: Self-pay | Admitting: Interventional Radiology

## 2019-07-30 DIAGNOSIS — I771 Stricture of artery: Secondary | ICD-10-CM

## 2019-08-02 ENCOUNTER — Ambulatory Visit (HOSPITAL_COMMUNITY)
Admission: RE | Admit: 2019-08-02 | Discharge: 2019-08-02 | Disposition: A | Discharge status: Home / Self Care | Payer: Medicare HMO | Source: Ambulatory Visit | Attending: Interventional Radiology | Admitting: Interventional Radiology

## 2019-08-02 ENCOUNTER — Other Ambulatory Visit: Payer: Self-pay

## 2019-08-02 DIAGNOSIS — I771 Stricture of artery: Secondary | ICD-10-CM

## 2019-08-02 NOTE — Progress Notes (Signed)
Carotid duplex has been completed.   Preliminary results in CV Proc.   Vicki Page 08/02/2019 3:21 PM

## 2019-08-06 ENCOUNTER — Telehealth (HOSPITAL_COMMUNITY): Payer: Self-pay

## 2019-08-06 NOTE — Telephone Encounter (Signed)
Pt agreed to f/u in 6 months with cta head/neck. AW 

## 2019-09-09 ENCOUNTER — Other Ambulatory Visit: Payer: Self-pay | Admitting: Gastroenterology

## 2019-09-23 DIAGNOSIS — E7800 Pure hypercholesterolemia, unspecified: Secondary | ICD-10-CM | POA: Diagnosis not present

## 2019-09-23 DIAGNOSIS — M1990 Unspecified osteoarthritis, unspecified site: Secondary | ICD-10-CM | POA: Diagnosis not present

## 2019-09-23 DIAGNOSIS — I10 Essential (primary) hypertension: Secondary | ICD-10-CM | POA: Diagnosis not present

## 2019-09-23 DIAGNOSIS — F324 Major depressive disorder, single episode, in partial remission: Secondary | ICD-10-CM | POA: Diagnosis not present

## 2019-09-23 DIAGNOSIS — N183 Chronic kidney disease, stage 3 unspecified: Secondary | ICD-10-CM | POA: Diagnosis not present

## 2019-09-23 DIAGNOSIS — E119 Type 2 diabetes mellitus without complications: Secondary | ICD-10-CM | POA: Diagnosis not present

## 2019-09-23 DIAGNOSIS — E039 Hypothyroidism, unspecified: Secondary | ICD-10-CM | POA: Diagnosis not present

## 2019-10-07 ENCOUNTER — Other Ambulatory Visit: Payer: Self-pay | Admitting: Family Medicine

## 2019-10-07 DIAGNOSIS — Z1231 Encounter for screening mammogram for malignant neoplasm of breast: Secondary | ICD-10-CM

## 2019-11-04 ENCOUNTER — Other Ambulatory Visit: Payer: Self-pay

## 2019-11-04 ENCOUNTER — Ambulatory Visit
Admission: RE | Admit: 2019-11-04 | Discharge: 2019-11-04 | Disposition: A | Discharge status: Home / Self Care | Payer: Medicare HMO | Source: Ambulatory Visit | Attending: Family Medicine | Admitting: Family Medicine

## 2019-11-04 DIAGNOSIS — Z1231 Encounter for screening mammogram for malignant neoplasm of breast: Secondary | ICD-10-CM | POA: Diagnosis not present

## 2019-11-13 DIAGNOSIS — N760 Acute vaginitis: Secondary | ICD-10-CM | POA: Diagnosis not present

## 2019-11-17 DIAGNOSIS — B370 Candidal stomatitis: Secondary | ICD-10-CM | POA: Diagnosis not present

## 2019-11-25 ENCOUNTER — Other Ambulatory Visit (HOSPITAL_COMMUNITY)
Admission: RE | Admit: 2019-11-25 | Discharge: 2019-11-25 | Disposition: A | Discharge status: Home / Self Care | Payer: Medicare HMO | Source: Ambulatory Visit | Attending: Family Medicine | Admitting: Family Medicine

## 2019-11-25 DIAGNOSIS — Z23 Encounter for immunization: Secondary | ICD-10-CM | POA: Diagnosis not present

## 2019-11-25 DIAGNOSIS — Z122 Encounter for screening for malignant neoplasm of respiratory organs: Secondary | ICD-10-CM | POA: Diagnosis not present

## 2019-11-25 DIAGNOSIS — F324 Major depressive disorder, single episode, in partial remission: Secondary | ICD-10-CM | POA: Diagnosis not present

## 2019-11-25 DIAGNOSIS — I10 Essential (primary) hypertension: Secondary | ICD-10-CM | POA: Diagnosis not present

## 2019-11-25 DIAGNOSIS — Z1151 Encounter for screening for human papillomavirus (HPV): Secondary | ICD-10-CM | POA: Diagnosis not present

## 2019-11-25 DIAGNOSIS — I739 Peripheral vascular disease, unspecified: Secondary | ICD-10-CM | POA: Diagnosis not present

## 2019-11-25 DIAGNOSIS — Z124 Encounter for screening for malignant neoplasm of cervix: Secondary | ICD-10-CM | POA: Diagnosis not present

## 2019-11-25 DIAGNOSIS — E039 Hypothyroidism, unspecified: Secondary | ICD-10-CM | POA: Diagnosis not present

## 2019-11-25 DIAGNOSIS — F17200 Nicotine dependence, unspecified, uncomplicated: Secondary | ICD-10-CM | POA: Diagnosis not present

## 2019-11-25 DIAGNOSIS — E118 Type 2 diabetes mellitus with unspecified complications: Secondary | ICD-10-CM | POA: Diagnosis not present

## 2019-11-25 DIAGNOSIS — Z0000 Encounter for general adult medical examination without abnormal findings: Secondary | ICD-10-CM | POA: Diagnosis not present

## 2019-11-25 DIAGNOSIS — Z01419 Encounter for gynecological examination (general) (routine) without abnormal findings: Secondary | ICD-10-CM | POA: Insufficient documentation

## 2019-11-25 DIAGNOSIS — K2270 Barrett's esophagus without dysplasia: Secondary | ICD-10-CM | POA: Diagnosis not present

## 2019-11-25 DIAGNOSIS — E559 Vitamin D deficiency, unspecified: Secondary | ICD-10-CM | POA: Diagnosis not present

## 2019-11-27 LAB — CYTOLOGY - PAP
Adequacy: ABSENT
Diagnosis: NEGATIVE

## 2019-12-16 ENCOUNTER — Other Ambulatory Visit: Payer: Self-pay | Admitting: *Deleted

## 2019-12-16 DIAGNOSIS — F17210 Nicotine dependence, cigarettes, uncomplicated: Secondary | ICD-10-CM

## 2019-12-16 DIAGNOSIS — Z87891 Personal history of nicotine dependence: Secondary | ICD-10-CM

## 2019-12-30 DIAGNOSIS — M1990 Unspecified osteoarthritis, unspecified site: Secondary | ICD-10-CM | POA: Diagnosis not present

## 2019-12-30 DIAGNOSIS — K219 Gastro-esophageal reflux disease without esophagitis: Secondary | ICD-10-CM | POA: Diagnosis not present

## 2019-12-30 DIAGNOSIS — E039 Hypothyroidism, unspecified: Secondary | ICD-10-CM | POA: Diagnosis not present

## 2019-12-30 DIAGNOSIS — I10 Essential (primary) hypertension: Secondary | ICD-10-CM | POA: Diagnosis not present

## 2019-12-30 DIAGNOSIS — E118 Type 2 diabetes mellitus with unspecified complications: Secondary | ICD-10-CM | POA: Diagnosis not present

## 2019-12-30 DIAGNOSIS — N183 Chronic kidney disease, stage 3 unspecified: Secondary | ICD-10-CM | POA: Diagnosis not present

## 2019-12-30 DIAGNOSIS — E7800 Pure hypercholesterolemia, unspecified: Secondary | ICD-10-CM | POA: Diagnosis not present

## 2019-12-30 DIAGNOSIS — F324 Major depressive disorder, single episode, in partial remission: Secondary | ICD-10-CM | POA: Diagnosis not present

## 2020-01-08 DIAGNOSIS — E039 Hypothyroidism, unspecified: Secondary | ICD-10-CM | POA: Diagnosis not present

## 2020-01-08 DIAGNOSIS — E7800 Pure hypercholesterolemia, unspecified: Secondary | ICD-10-CM | POA: Diagnosis not present

## 2020-01-08 DIAGNOSIS — I10 Essential (primary) hypertension: Secondary | ICD-10-CM | POA: Diagnosis not present

## 2020-01-08 DIAGNOSIS — E118 Type 2 diabetes mellitus with unspecified complications: Secondary | ICD-10-CM | POA: Diagnosis not present

## 2020-01-15 ENCOUNTER — Encounter: Payer: Self-pay | Admitting: Acute Care

## 2020-01-15 ENCOUNTER — Other Ambulatory Visit: Payer: Self-pay

## 2020-01-15 ENCOUNTER — Ambulatory Visit (INDEPENDENT_AMBULATORY_CARE_PROVIDER_SITE_OTHER): Payer: Medicare HMO | Admitting: Acute Care

## 2020-01-15 ENCOUNTER — Ambulatory Visit (INDEPENDENT_AMBULATORY_CARE_PROVIDER_SITE_OTHER)
Admission: RE | Admit: 2020-01-15 | Discharge: 2020-01-15 | Disposition: A | Discharge status: Home / Self Care | Payer: Medicare HMO | Source: Ambulatory Visit | Attending: Family Medicine | Admitting: Family Medicine

## 2020-01-15 DIAGNOSIS — Z122 Encounter for screening for malignant neoplasm of respiratory organs: Secondary | ICD-10-CM

## 2020-01-15 DIAGNOSIS — F17210 Nicotine dependence, cigarettes, uncomplicated: Secondary | ICD-10-CM | POA: Diagnosis not present

## 2020-01-15 DIAGNOSIS — Z87891 Personal history of nicotine dependence: Secondary | ICD-10-CM | POA: Diagnosis not present

## 2020-01-15 NOTE — Patient Instructions (Signed)
Thank you for participating in the [facility]. It was our pleasure to meet you today. We will call you with the results of your scan within the next few days. Your scan will be assigned a Lung RADS category score by the physicians reading the scans.  This Lung RADS score determines follow up scanning.  See below for description of categories, and follow up screening recommendations. We will be in touch to schedule your follow up screening annually or based on recommendations of our providers. We will fax a copy of your scan results to your Primary Care Physician, or the physician who referred you to the program, to ensure they have the results. Please call the office if you have any questions or concerns regarding your scanning experience or results.  Our office number is [phone]. Please speak with Denise Phelps, RN. She is our Lung Cancer Screening RN. If she is unavailable when you call, please have the office staff send her a message. She will return your call at her earliest convenience. Remember, if your scan is normal, we will scan you annually as long as you continue to meet the criteria for the program. (Age 55-77, Current smoker or smoker who has quit within the last 15 years). If you are a smoker, remember, quitting is the single most powerful action that you can take to decrease your risk of lung cancer and other pulmonary, breathing related problems. We know quitting is hard, and we are here to help.  Please let us know if there is anything we can do to help you meet your goal of quitting. If you are a former smoker, congratulations. We are proud of you! Remain smoke free! Remember you can refer friends or family members through the number above.  We will screen them to make sure they meet criteria for the program. Thank you for helping us take better care of you by participating in Lung Screening.  Lung RADS Categories:  Lung RADS 1: no nodules  or definitely non-concerning nodules.  Recommendation is for a repeat annual scan in 12 months.  Lung RADS 2:  nodules that are non-concerning in appearance and behavior with a very low likelihood of becoming an active cancer. Recommendation is for a repeat annual scan in 12 months.  Lung RADS 3: nodules that are probably non-concerning , includes nodules with a low likelihood of becoming an active cancer.  Recommendation is for a 6-month repeat screening scan. Often noted after an upper respiratory illness. We will be in touch to make sure you have no questions, and to schedule your 6-month scan.  Lung RADS 4 A: nodules with concerning findings, recommendation is most often for a follow up scan in 3 months or additional testing based on our provider's assessment of the scan. We will be in touch to make sure you have no questions and to schedule the recommended 3 month follow up scan.  Lung RADS 4 B:  indicates findings that are concerning. We will be in touch with you to schedule additional diagnostic testing based on our provider's  assessment of the scan.   

## 2020-01-15 NOTE — Progress Notes (Signed)
Shared Decision Making Visit Lung Cancer Screening Program [phone])   Eligibility:  Age 69 y.o.  Pack Years Smoking History Calculation 33 pack year smoking history (# packs/per year x # years smoked)  Recent History of coughing up blood  no  Unexplained weight loss? no ( >Than 15 pounds within the last 6 months )  Prior History Lung / other cancer no (Diagnosis within the last 5 years already requiring surveillance chest CT Scans).  Smoking Status Current Smoker  Former Smokers: Years since quit: NA  Quit Date: NA  Visit Components:  Discussion included one or more decision making aids. yes  Discussion included risk/benefits of screening. yes  Discussion included potential follow up diagnostic testing for abnormal scans. yes  Discussion included meaning and risk of over diagnosis. yes  Discussion included meaning and risk of False Positives. yes  Discussion included meaning of total radiation exposure. yes  Counseling Included:  Importance of adherence to annual lung cancer LDCT screening. yes  Impact of comorbidities on ability to participate in the program. yes  Ability and willingness to under diagnostic treatment. yes  Smoking Cessation Counseling:  Current Smokers:   Discussed importance of smoking cessation. yes  Information about tobacco cessation classes and interventions provided to patient. yes  Patient provided with "ticket" for LDCT Scan. yes  Symptomatic Patient. no  Counseling  Diagnosis Code: Tobacco Use Z72.0  Asymptomatic Patient yes  Counseling (Intermediate counseling: > three minutes counseling) X6468  Former Smokers:   Discussed the importance of maintaining cigarette abstinence. yes  Diagnosis Code: Personal History of Nicotine Dependence. E32.122  Information about tobacco cessation classes and interventions provided to patient. Yes  Patient provided with "ticket" for LDCT Scan. yes  Written Order for Lung Cancer  Screening with LDCT placed in Epic. Yes (CT Chest Lung Cancer Screening Low Dose W/O CM) [id number] Z12.2-Screening of respiratory organs Z87.891-Personal history of nicotine dependence  I have spent 25 minutes of face to face time with Ms. Gibbs discussing the risks and benefits of lung cancer screening. We viewed a power point together that explained in detail the above noted topics. We paused at intervals to allow for questions to be asked and answered to ensure understanding.We discussed that the single most powerful action that she can take to decrease her risk of developing lung cancer is to quit smoking. We discussed whether or not she is ready to commit to setting a quit date. We discussed options for tools to aid in quitting smoking including nicotine replacement therapy, non-nicotine medications, support groups, Quit Smart classes, and behavior modification. We discussed that often times setting smaller, more achievable goals, such as eliminating 1 cigarette a day for a week and then 2 cigarettes a day for a week can be helpful in slowly decreasing the number of cigarettes smoked. This allows for a sense of accomplishment as well as providing a clinical benefit. I gave her the " Be Stronger Than Your Excuses" card with contact information for community resources, classes, free nicotine replacement therapy, and access to mobile apps, text messaging, and on-line smoking cessation help. I have also given her my card and contact information in the event she needs to contact me. We discussed the time and location of the scan, and that either Doroteo Glassman RN or I will call with the results within 24-48 hours of receiving them. I have offered her  a copy of the power point we viewed  as a resource in the event they need reinforcement of  the concepts we discussed today in the office. The patient verbalized understanding of all of  the above and had no further questions upon leaving the office. They have my  contact information in the event they have any further questions.  I spent 3 minutes counseling on smoking cessation and the health risks of continued tobacco abuse.  I explained to the patient that there has been a high incidence of coronary artery disease noted on these exams. I explained that this is a non-gated exam therefore degree or severity cannot be determined. This patient is currently on statin therapy. I have asked the patient to follow-up with their PCP regarding any incidental finding of coronary artery disease and management with diet or medication as their PCP  feels is clinically indicated. The patient verbalized understanding of the above and had no further questions upon completion of the visit.      Magdalen Spatz, NP 01/15/2020 11:43 AM

## 2020-01-16 NOTE — Progress Notes (Signed)
Please call patient and let them  know their  low dose Ct was read as a Lung RADS 2: nodules that are benign in appearance and behavior with a very low likelihood of becoming a clinically active cancer due to size or lack of growth. Recommendation per radiology is for a repeat LDCT in 12 months. .Please let them  know we will order and schedule their  annual screening scan for 01/2021. Please let them  know there was notation of CAD on their  scan.  Please remind the patient  that this is a non-gated exam therefore degree or severity of disease  cannot be determined. Please have them  follow up with their PCP regarding potential risk factor modification, dietary therapy or pharmacologic therapy if clinically indicated. Pt.  is  currently on statin therapy. Please place order for annual  screening scan for  01/2021 and fax results to PCP. Thanks so much.  [name], this patient has a dilated pulmonary artery, which is suspicious for Pulmonary Hypertension. This should be evaluated.She is not followed by cards and has never had an echo that I can see within the [facility]. Ask if she would like to be scheduled to see one of our docs to work this up. Let her know this can contribute to shortness of breath. If she would like further evaluation, , Dr. Silas Flood is the [facility] guy, so if you could schedule with him, that would be great. Thanks so much.

## 2020-01-22 ENCOUNTER — Ambulatory Visit: Payer: Medicare HMO | Admitting: Internal Medicine

## 2020-01-23 ENCOUNTER — Telehealth: Payer: Self-pay | Admitting: Acute Care

## 2020-01-23 DIAGNOSIS — Z87891 Personal history of nicotine dependence: Secondary | ICD-10-CM

## 2020-01-23 DIAGNOSIS — F17210 Nicotine dependence, cigarettes, uncomplicated: Secondary | ICD-10-CM

## 2020-01-23 NOTE — Telephone Encounter (Signed)
LMTC x 1  

## 2020-01-28 NOTE — Telephone Encounter (Signed)
LMTC x 1 - to discuss CT results 

## 2020-01-28 NOTE — Telephone Encounter (Signed)
Pt informed of CT results per Eric Form, NP.  PT verbalized understanding.  Copy sent to PCP.  Order placed for 1 yr f/u CT. Pt scheduled to see Dr Silas Flood 02/26/20 3:00.

## 2020-02-20 DIAGNOSIS — I10 Essential (primary) hypertension: Secondary | ICD-10-CM | POA: Diagnosis not present

## 2020-02-20 DIAGNOSIS — E118 Type 2 diabetes mellitus with unspecified complications: Secondary | ICD-10-CM | POA: Diagnosis not present

## 2020-02-20 DIAGNOSIS — E039 Hypothyroidism, unspecified: Secondary | ICD-10-CM | POA: Diagnosis not present

## 2020-02-20 DIAGNOSIS — R6889 Other general symptoms and signs: Secondary | ICD-10-CM | POA: Diagnosis not present

## 2020-02-20 DIAGNOSIS — E7800 Pure hypercholesterolemia, unspecified: Secondary | ICD-10-CM | POA: Diagnosis not present

## 2020-02-26 ENCOUNTER — Institutional Professional Consult (permissible substitution): Payer: Medicare HMO | Admitting: Pulmonary Disease

## 2020-02-27 ENCOUNTER — Other Ambulatory Visit (HOSPITAL_COMMUNITY): Payer: Self-pay | Admitting: Interventional Radiology

## 2020-02-27 DIAGNOSIS — I771 Stricture of artery: Secondary | ICD-10-CM

## 2020-03-03 ENCOUNTER — Other Ambulatory Visit (HOSPITAL_COMMUNITY): Payer: Self-pay

## 2020-03-03 DIAGNOSIS — I771 Stricture of artery: Secondary | ICD-10-CM

## 2020-03-06 DIAGNOSIS — F324 Major depressive disorder, single episode, in partial remission: Secondary | ICD-10-CM | POA: Diagnosis not present

## 2020-03-06 DIAGNOSIS — I10 Essential (primary) hypertension: Secondary | ICD-10-CM | POA: Diagnosis not present

## 2020-03-06 DIAGNOSIS — E118 Type 2 diabetes mellitus with unspecified complications: Secondary | ICD-10-CM | POA: Diagnosis not present

## 2020-03-06 DIAGNOSIS — M1990 Unspecified osteoarthritis, unspecified site: Secondary | ICD-10-CM | POA: Diagnosis not present

## 2020-03-06 DIAGNOSIS — E7800 Pure hypercholesterolemia, unspecified: Secondary | ICD-10-CM | POA: Diagnosis not present

## 2020-03-06 DIAGNOSIS — K219 Gastro-esophageal reflux disease without esophagitis: Secondary | ICD-10-CM | POA: Diagnosis not present

## 2020-03-06 DIAGNOSIS — E119 Type 2 diabetes mellitus without complications: Secondary | ICD-10-CM | POA: Diagnosis not present

## 2020-03-06 DIAGNOSIS — N183 Chronic kidney disease, stage 3 unspecified: Secondary | ICD-10-CM | POA: Diagnosis not present

## 2020-03-06 DIAGNOSIS — E039 Hypothyroidism, unspecified: Secondary | ICD-10-CM | POA: Diagnosis not present

## 2020-03-11 ENCOUNTER — Ambulatory Visit: Payer: Medicare HMO

## 2020-03-11 ENCOUNTER — Other Ambulatory Visit: Payer: Self-pay

## 2020-03-11 ENCOUNTER — Ambulatory Visit (INDEPENDENT_AMBULATORY_CARE_PROVIDER_SITE_OTHER): Payer: Medicare HMO | Admitting: Emergency Medicine

## 2020-03-11 ENCOUNTER — Encounter: Payer: Self-pay | Admitting: Emergency Medicine

## 2020-03-11 VITALS — BP 138/74 | HR 65 | Temp 97.0°F | Ht 63.0 in | Wt 141.8 lb

## 2020-03-11 DIAGNOSIS — I270 Primary pulmonary hypertension: Secondary | ICD-10-CM | POA: Diagnosis not present

## 2020-03-11 DIAGNOSIS — I2729 Other secondary pulmonary hypertension: Secondary | ICD-10-CM

## 2020-03-11 DIAGNOSIS — F17200 Nicotine dependence, unspecified, uncomplicated: Secondary | ICD-10-CM | POA: Diagnosis not present

## 2020-03-11 DIAGNOSIS — Z72820 Sleep deprivation: Secondary | ICD-10-CM | POA: Diagnosis not present

## 2020-03-11 DIAGNOSIS — R6889 Other general symptoms and signs: Secondary | ICD-10-CM | POA: Diagnosis not present

## 2020-03-11 NOTE — Patient Instructions (Signed)
We will perform an echocardiogram We will perform pulmonary function testing at your next office visit.  We will arrange for a sleep study We will perform a ventilation-perfusion scan  Work hard to decrease your cigarettes. Our ultimate goal will be to stop altogether.  We will repeat your CT chest for lung screening in December 2022.  Follow with Dr Lamonte Sakai next available after your testing to review together.

## 2020-03-11 NOTE — Progress Notes (Signed)
Subjective:    Patient ID: Vicki Page, female    DOB: 06/28/1950, 71 y.o.   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 She is smoking about 0.5 pk/day  She participates in lung cancer screening program and underwent a low-dose CT chest 01/15/2020 which I have reviewed, shows a 4.4 mm peripheral left lower lobe solid pulmonary nodule, some moderate centrilobular and paraseptal emphysema, no mediastinal or hilar lymphadenopathy.  Note was also made of a dilated main pulmonary artery 3.6 cm  She describes today good functional capacity. She is able to walk, does not not run. Can climb stairs. Able to do housework, does get tired with vacuuming. She is having some L sided arm tingling depending on position. No CP.  She is not on any bronchodilator therapy at this time. Her sleep is poor, difficulty initiating sleep, ? Whether she snores. She has some daytime sleepiness, can fall asleep watching TV.    Review of Systems As per HPI  Past Medical History:  Diagnosis Date  . Adenomatous colon polyp   . Aneurysm ([place])    brain x 2  . Anxiety   . Arthritis   . Atherosclerosis   . Barrett's esophagus   . Carotid artery occlusion   . Cataracts, bilateral    just watching  . DDD (degenerative disc disease), lumbar   . Depression   . Diverticulosis   . DVT (deep venous thrombosis) (HCC)    left leg  . Fall at home May 30, 2014   Pt slipped in tub- hurt left hip and right shoulder  . GERD (gastroesophageal reflux disease)   . H. pylori infection   . Hyperlipidemia   . Hypertension   . Hypothyroidism   . IBS (irritable bowel syndrome)   . Osteoarthritis   . Pre-diabetes    no meds, diet controlled  . RLS (restless legs syndrome)   .  Tubular adenoma of colon   . Ulcer of the stomach and intestine      Family History  Problem Relation Age of Onset  . Deep vein thrombosis Mother   . Diabetes Mother        Amputation  . Heart disease Mother        Heart Disease before age 21  . Hyperlipidemia Mother   . Hypertension Mother   . Heart attack Mother   . Stroke Mother   . Thyroid disease Mother   . Heart disease Father        Heart Disease before age 12  . Hypertension Father   . Heart attack Father   . Hyperlipidemia Father   . Heart disease Brother        Heart Disease before age 35  . Hyperlipidemia Brother   . Hypertension Brother   . Heart attack Brother   . COPD Brother   . Diabetes Brother   . Colonic polyp Brother   . Colon cancer Neg Hx   . Stomach cancer Neg Hx      Social History   Socioeconomic History  . Marital status: Single    Spouse name: Not on file  . Number of children: 2  . Years of education: 14  . Highest education level: Not on file  Occupational History  . Occupation: Retired  Tobacco Use  .  Smoking status: Current Every Day Smoker    Packs/day: 0.25    Types: Cigarettes  . Smokeless tobacco: Never Used  . Tobacco comment: 5 cigarettes smoked daily ARJ 03/11/20  Vaping Use  . Vaping Use: Former  Substance and Sexual Activity  . Alcohol use: No    Alcohol/week: 0.0 standard drinks  . Drug use: No  . Sexual activity: Never    Birth control/protection: Post-menopausal  Other Topics Concern  . 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  Ropinirole Nausea And Vomiting  . Codeine Nausea And Vomiting  . Darvon [Propoxyphene Hcl] Nausea And Vomiting  . Flagyl [Metronidazole] Nausea And Vomiting     Outpatient Medications Prior to Visit  Medication Sig Dispense Refill  . acetaminophen (TYLENOL) 500 MG tablet Take 1,000 mg by mouth 2 (two) times daily as needed for pain.     Marland Kitchen amLODipine (NORVASC) 10 MG tablet Take 10 mg by mouth daily. for high blood pressure    . aspirin EC 81 MG tablet Take 81 mg by mouth daily.    Marland Kitchen buPROPion (WELLBUTRIN SR) 150 MG 12 hr tablet Take 150 mg by mouth 2 (two) times daily.    . Calcium-Vitamin D (CALTRATE 600 PLUS-VIT D PO) Take 1 tablet by mouth daily before supper.     . Cholecalciferol (VITAMIN D) 2000 UNITS CAPS Take 2,000 Units by mouth daily before supper.     . cilostazol (PLETAL) 50 MG tablet Take 50 mg by mouth 2 (two) times daily.    . clopidogrel (PLAVIX) 75 MG tablet Take 1 tablet (75 mg total) by mouth daily with breakfast. 30 tablet 2  . diclofenac Sodium (VOLTAREN) 1 % GEL diclofenac 1 % topical gel  APPLY 2 GRAMS TO THE AFFECTED AREA(S) BY TOPICAL ROUTE 4 TIMES PER DAY FOR 30 DAYS    . hydrochlorothiazide (MICROZIDE) 12.5 MG capsule Take 12.5 mg by mouth daily.    Marland Kitchen levothyroxine (SYNTHROID, LEVOTHROID) 25 MCG tablet Take 25 mcg by mouth daily before breakfast.     . Magnesium 250 MG TABS Take 250 mg by mouth daily before supper.    . Multiple Vitamin (MULTIVITAMIN WITH MINERALS) TABS tablet Take 1 tablet by mouth daily before supper. Centrum Silver    . nystatin (MYCOSTATIN) 100000 UNIT/ML suspension Swish and swallow 5 ml three times daily. 200 mL 0  . pantoprazole (PROTONIX) 40 MG tablet Take 1 tablet (40 mg total) by mouth 2 (two) times daily before a meal. 60 tablet 3  . potassium gluconate 595 MG TABS Take 595 mg by mouth daily before supper.     . Probiotic Product (PROBIOTIC PO) Take 1 tablet by mouth daily after lunch.     . Psyllium (METAMUCIL) 28.3 % POWD Take 1 Scoop  by mouth daily.    . simvastatin (ZOCOR) 80 MG tablet Take 80 mg by mouth daily with supper.     . vitamin B-12 (CYANOCOBALAMIN) 500 MCG tablet Take 500 mcg by mouth  daily.    . famotidine (PEPCID) 20 MG tablet TAKE 1 TABLET BY MOUTH EVERYDAY AT BEDTIME (Patient not taking: Reported on 03/11/2020) 90 tablet 1   No facility-administered medications prior to visit.           Objective:   Physical Exam Vitals:   03/11/20 1131  BP: 138/74  Pulse: 65  Temp: (!) 97 F (36.1 C)  SpO2: 99%  Weight: 141 lb 12.8 oz (64.3 kg)  Height: 5\' 3"  (1.6 m)   Gen: Pleasant, well-nourished, in no distress,  normal affect  ENT: No lesions,  mouth clear,  oropharynx clear, no postnasal drip  Neck: No JVD, no stridor  Lungs: No use of accessory muscles, no crackles or wheezing on normal respiration, no wheeze on forced expiration  Cardiovascular: RRR, intermittent early systolic M, Soft diastolic M  Musculoskeletal: No deformities, no cyanosis or clubbing  Neuro: alert, awake, non focal  Skin: Warm, no lesions or rash      Assessment & Plan:  Other secondary pulmonary hypertension (HCC) Dilated main PA noted spuriously on her lung cancer screening CT scan.  She has a history of VTE, some symptoms consistent with OSA, also smokes and is at risk for COPD.  She needs a TTE to evaluate for possible PAH, abnormal RV function.  We will initiate screening for possible causes for secondary PAH.  She will have PFT, sleep study, ventilation/perfusion scan.  If her echocardiogram is abnormal then I will check autoimmune labs.  We will perform an echocardiogram We will perform pulmonary function testing at your next office visit.  We will arrange for a sleep study We will perform a ventilation-perfusion scan  Follow with Dr Lamonte Sakai next available after your testing to review together.   Smoker Still smokes half pack a day.  Her lung cancer screening CT had a single pulmonary nodule that will need to be  followed after 12 months.  We will arrange for this scan.  Discussed cessation with her today.  She is motivated, has stopped in the past for 3 months.  She does not feel that she cut down further at this time due to the anxiety provoking factors in her life.  We will continue to discuss.  Baltazar Apo, MD, PhD 03/11/2020, 12:46 PM [facility] [phone] or if no answer [phone]

## 2020-03-11 NOTE — Addendum Note (Signed)
Addended by: Gavin Potters R on: 03/11/2020 04:10 PM   Modules accepted: Orders

## 2020-03-11 NOTE — Assessment & Plan Note (Signed)
Dilated main PA noted spuriously on her lung cancer screening CT scan.  She has a history of VTE, some symptoms consistent with OSA, also smokes and is at risk for COPD.  She needs a TTE to evaluate for possible PAH, abnormal RV function.  We will initiate screening for possible causes for secondary PAH.  She will have PFT, sleep study, ventilation/perfusion scan.  If her echocardiogram is abnormal then I will check autoimmune labs.  We will perform an echocardiogram We will perform pulmonary function testing at your next office visit.  We will arrange for a sleep study We will perform a ventilation-perfusion scan  Follow with Dr Lamonte Sakai next available after your testing to review together.

## 2020-03-11 NOTE — Assessment & Plan Note (Addendum)
Still smokes half pack a day.  Her lung cancer screening CT had a single pulmonary nodule that will need to be followed after 12 months.  We will arrange for this scan.  Discussed cessation with her today.  She is motivated, has stopped in the past for 3 months.  She does not feel that she cut down further at this time due to the anxiety provoking factors in her life.  We will continue to discuss.

## 2020-03-12 ENCOUNTER — Ambulatory Visit (HOSPITAL_COMMUNITY)
Admission: RE | Admit: 2020-03-12 | Discharge: 2020-03-12 | Disposition: A | Discharge status: Home / Self Care | Payer: Medicare HMO | Source: Ambulatory Visit | Attending: Interventional Radiology | Admitting: Interventional Radiology

## 2020-03-12 ENCOUNTER — Encounter (HOSPITAL_COMMUNITY): Payer: Self-pay

## 2020-03-12 DIAGNOSIS — I771 Stricture of artery: Secondary | ICD-10-CM | POA: Insufficient documentation

## 2020-03-12 DIAGNOSIS — I6503 Occlusion and stenosis of bilateral vertebral arteries: Secondary | ICD-10-CM | POA: Diagnosis not present

## 2020-03-12 DIAGNOSIS — R6889 Other general symptoms and signs: Secondary | ICD-10-CM | POA: Diagnosis not present

## 2020-03-12 DIAGNOSIS — I6523 Occlusion and stenosis of bilateral carotid arteries: Secondary | ICD-10-CM | POA: Diagnosis not present

## 2020-03-12 DIAGNOSIS — I708 Atherosclerosis of other arteries: Secondary | ICD-10-CM | POA: Diagnosis not present

## 2020-03-12 DIAGNOSIS — I672 Cerebral atherosclerosis: Secondary | ICD-10-CM | POA: Diagnosis not present

## 2020-03-12 DIAGNOSIS — E049 Nontoxic goiter, unspecified: Secondary | ICD-10-CM | POA: Diagnosis not present

## 2020-03-12 DIAGNOSIS — Q283 Other malformations of cerebral vessels: Secondary | ICD-10-CM | POA: Diagnosis not present

## 2020-03-12 LAB — POCT I-STAT CREATININE: Creatinine, Ser: 1.1 mg/dL — ABNORMAL HIGH (ref 0.44–1.00)

## 2020-03-12 MED ORDER — IOHEXOL 350 MG/ML SOLN
100.0000 mL | Freq: Once | INTRAVENOUS | Status: AC | PRN
  Administered 2020-03-12: 100 mL via INTRAVENOUS

## 2020-03-13 ENCOUNTER — Telehealth (HOSPITAL_COMMUNITY): Payer: Self-pay

## 2020-03-13 NOTE — Telephone Encounter (Signed)
Called pt regarding recent imaging, no answer, left vm. AW  

## 2020-03-14 ENCOUNTER — Encounter (HOSPITAL_BASED_OUTPATIENT_CLINIC_OR_DEPARTMENT_OTHER): Payer: Self-pay

## 2020-03-16 ENCOUNTER — Telehealth (HOSPITAL_COMMUNITY): Payer: Self-pay

## 2020-03-16 NOTE — Telephone Encounter (Signed)
Returned pt's call, no answer, left vm. AW  

## 2020-03-17 ENCOUNTER — Ambulatory Visit (HOSPITAL_COMMUNITY)
Admission: RE | Admit: 2020-03-17 | Discharge: 2020-03-17 | Disposition: A | Discharge status: Home / Self Care | Payer: Medicare HMO | Source: Ambulatory Visit | Attending: Emergency Medicine | Admitting: Emergency Medicine

## 2020-03-17 ENCOUNTER — Other Ambulatory Visit: Payer: Self-pay

## 2020-03-17 DIAGNOSIS — I270 Primary pulmonary hypertension: Secondary | ICD-10-CM | POA: Diagnosis not present

## 2020-03-17 DIAGNOSIS — I10 Essential (primary) hypertension: Secondary | ICD-10-CM | POA: Diagnosis not present

## 2020-03-17 DIAGNOSIS — I2699 Other pulmonary embolism without acute cor pulmonale: Secondary | ICD-10-CM | POA: Diagnosis not present

## 2020-03-17 DIAGNOSIS — R911 Solitary pulmonary nodule: Secondary | ICD-10-CM | POA: Diagnosis not present

## 2020-03-17 MED ORDER — TECHNETIUM TO 99M ALBUMIN AGGREGATED
4.4000 | Freq: Once | INTRAVENOUS | Status: AC | PRN
  Administered 2020-03-17: 4.4 via INTRAVENOUS

## 2020-04-06 ENCOUNTER — Telehealth: Payer: Self-pay | Admitting: Emergency Medicine

## 2020-04-06 DIAGNOSIS — E119 Type 2 diabetes mellitus without complications: Secondary | ICD-10-CM | POA: Diagnosis not present

## 2020-04-06 DIAGNOSIS — F324 Major depressive disorder, single episode, in partial remission: Secondary | ICD-10-CM | POA: Diagnosis not present

## 2020-04-06 DIAGNOSIS — E118 Type 2 diabetes mellitus with unspecified complications: Secondary | ICD-10-CM | POA: Diagnosis not present

## 2020-04-06 DIAGNOSIS — E7800 Pure hypercholesterolemia, unspecified: Secondary | ICD-10-CM | POA: Diagnosis not present

## 2020-04-06 DIAGNOSIS — I10 Essential (primary) hypertension: Secondary | ICD-10-CM | POA: Diagnosis not present

## 2020-04-06 DIAGNOSIS — M1990 Unspecified osteoarthritis, unspecified site: Secondary | ICD-10-CM | POA: Diagnosis not present

## 2020-04-06 DIAGNOSIS — E039 Hypothyroidism, unspecified: Secondary | ICD-10-CM | POA: Diagnosis not present

## 2020-04-06 DIAGNOSIS — K219 Gastro-esophageal reflux disease without esophagitis: Secondary | ICD-10-CM | POA: Diagnosis not present

## 2020-04-06 DIAGNOSIS — N183 Chronic kidney disease, stage 3 unspecified: Secondary | ICD-10-CM | POA: Diagnosis not present

## 2020-04-06 NOTE — Telephone Encounter (Signed)
Assessment & Plan:  Other secondary pulmonary hypertension ([place]) Dilated main PA noted spuriously on her lung cancer screening CT scan.  She has a history of VTE, some symptoms consistent with OSA, also smokes and is at risk for COPD.  She needs a TTE to evaluate for possible PAH, abnormal RV function.  We will initiate screening for possible causes for secondary PAH.  She will have PFT, sleep study, ventilation/perfusion scan.  If her echocardiogram is abnormal then I will check autoimmune labs.  We will perform an echocardiogram We will perform pulmonary function testing at your next office visit.  We will arrange for a sleep study We will perform a ventilation-perfusion scan  Follow with Dr Lamonte Sakai next available after your testing to review together.   Pt is scheduled to have the split night study 3/22 and is scheduled for the echocardiogram 3/25.  Pt has already had the ventilation-perfusion scan performed which was done 2/8.  Pt is to follow up with RB after she has done the testing per RB and to have PFT same day as that [place].   Attempted to call pt to get her scheduled for 1hr PFT with OV after with RB but unable to reach. Left message for pt to return call.  When pt returns call, please schedule pt for 1hr PFT with covid test before in April with OV same day with Dr. Lamonte Sakai.

## 2020-04-08 ENCOUNTER — Other Ambulatory Visit (HOSPITAL_COMMUNITY): Payer: Medicare HMO

## 2020-04-08 NOTE — Telephone Encounter (Signed)
LMTCB

## 2020-04-10 DIAGNOSIS — E7800 Pure hypercholesterolemia, unspecified: Secondary | ICD-10-CM | POA: Diagnosis not present

## 2020-04-10 DIAGNOSIS — E118 Type 2 diabetes mellitus with unspecified complications: Secondary | ICD-10-CM | POA: Diagnosis not present

## 2020-04-10 DIAGNOSIS — F324 Major depressive disorder, single episode, in partial remission: Secondary | ICD-10-CM | POA: Diagnosis not present

## 2020-04-10 DIAGNOSIS — E119 Type 2 diabetes mellitus without complications: Secondary | ICD-10-CM | POA: Diagnosis not present

## 2020-04-10 DIAGNOSIS — N183 Chronic kidney disease, stage 3 unspecified: Secondary | ICD-10-CM | POA: Diagnosis not present

## 2020-04-10 DIAGNOSIS — M1990 Unspecified osteoarthritis, unspecified site: Secondary | ICD-10-CM | POA: Diagnosis not present

## 2020-04-10 DIAGNOSIS — E039 Hypothyroidism, unspecified: Secondary | ICD-10-CM | POA: Diagnosis not present

## 2020-04-10 DIAGNOSIS — I10 Essential (primary) hypertension: Secondary | ICD-10-CM | POA: Diagnosis not present

## 2020-04-10 DIAGNOSIS — K219 Gastro-esophageal reflux disease without esophagitis: Secondary | ICD-10-CM | POA: Diagnosis not present

## 2020-04-10 NOTE — Telephone Encounter (Signed)
LMTCB and closing per protocol 

## 2020-04-17 DIAGNOSIS — K219 Gastro-esophageal reflux disease without esophagitis: Secondary | ICD-10-CM | POA: Diagnosis not present

## 2020-04-17 DIAGNOSIS — L304 Erythema intertrigo: Secondary | ICD-10-CM | POA: Diagnosis not present

## 2020-04-17 DIAGNOSIS — E118 Type 2 diabetes mellitus with unspecified complications: Secondary | ICD-10-CM | POA: Diagnosis not present

## 2020-04-17 DIAGNOSIS — I10 Essential (primary) hypertension: Secondary | ICD-10-CM | POA: Diagnosis not present

## 2020-04-17 DIAGNOSIS — R6889 Other general symptoms and signs: Secondary | ICD-10-CM | POA: Diagnosis not present

## 2020-04-22 NOTE — Telephone Encounter (Signed)
PCC's can you please help with this pt's questions about her appt's that you guys set up for her? Thanks  Dr. Lamonte Sakai, just confused as to where do I go on the 22nd of March for this sleep disorder testing? Also is there a phone number. Will I be inform this time of findings?  Also, this Echocardiogram, where do I go, is there a contact number? As the sleep disorder testing. Where I go for the Echocardiogram, front desk? Is there a contact number?

## 2020-04-23 DIAGNOSIS — R220 Localized swelling, mass and lump, head: Secondary | ICD-10-CM | POA: Diagnosis not present

## 2020-04-23 DIAGNOSIS — T367X5A Adverse effect of antifungal antibiotics, systemically used, initial encounter: Secondary | ICD-10-CM | POA: Diagnosis not present

## 2020-04-23 DIAGNOSIS — T50905A Adverse effect of unspecified drugs, medicaments and biological substances, initial encounter: Secondary | ICD-10-CM | POA: Diagnosis not present

## 2020-04-24 NOTE — Telephone Encounter (Signed)
I just called pt & left her a vm to call me so I can give her info.

## 2020-04-27 ENCOUNTER — Other Ambulatory Visit: Payer: Self-pay

## 2020-04-27 ENCOUNTER — Ambulatory Visit (INDEPENDENT_AMBULATORY_CARE_PROVIDER_SITE_OTHER): Payer: Medicare HMO | Admitting: Internal Medicine

## 2020-04-27 ENCOUNTER — Encounter: Payer: Self-pay | Admitting: Internal Medicine

## 2020-04-27 VITALS — BP 140/82 | HR 77 | Ht 61.0 in | Wt 142.0 lb

## 2020-04-27 DIAGNOSIS — K2270 Barrett's esophagus without dysplasia: Secondary | ICD-10-CM | POA: Diagnosis not present

## 2020-04-27 DIAGNOSIS — T7840XA Allergy, unspecified, initial encounter: Secondary | ICD-10-CM | POA: Diagnosis not present

## 2020-04-27 DIAGNOSIS — K1379 Other lesions of oral mucosa: Secondary | ICD-10-CM | POA: Diagnosis not present

## 2020-04-27 DIAGNOSIS — B372 Candidiasis of skin and nail: Secondary | ICD-10-CM | POA: Diagnosis not present

## 2020-04-27 DIAGNOSIS — R6889 Other general symptoms and signs: Secondary | ICD-10-CM | POA: Diagnosis not present

## 2020-04-27 DIAGNOSIS — R1084 Generalized abdominal pain: Secondary | ICD-10-CM | POA: Diagnosis not present

## 2020-04-27 DIAGNOSIS — R150 Incomplete defecation: Secondary | ICD-10-CM | POA: Diagnosis not present

## 2020-04-27 DIAGNOSIS — I10 Essential (primary) hypertension: Secondary | ICD-10-CM | POA: Diagnosis not present

## 2020-04-27 DIAGNOSIS — K219 Gastro-esophageal reflux disease without esophagitis: Secondary | ICD-10-CM | POA: Diagnosis not present

## 2020-04-27 DIAGNOSIS — K5909 Other constipation: Secondary | ICD-10-CM | POA: Diagnosis not present

## 2020-04-27 MED ORDER — PANTOPRAZOLE SODIUM 40 MG PO TBEC: 40.0000 mg | DELAYED_RELEASE_TABLET | Freq: Every day | ORAL | 2 refills | Status: AC

## 2020-04-27 MED ORDER — FIRST-MOUTHWASH BLM MT SUSP: OROMUCOSAL | 0 refills | Status: DC

## 2020-04-27 NOTE — Progress Notes (Signed)
Subjective:    Patient ID: Vicki Page, female    DOB: 1950-06-11, 70 y.o.   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  She was last seen 1 year ago.  She was seen a year ago and was having issues with morning nausea symptom, bad taste in her mouth and the question was raised of possible oral candidiasis.  Nystatin was given but did not seem to help but only used a few days.  Fluconazole was then prescribed and she developed perioral swelling and oral irritation and ulceration.  The medication was stopped.  Her symptoms from a mouth, tongue and throat standpoint resolved entirely as did some of her nausea symptom.  Very recently primary care represcribed fluconazole for a rash under her breast which was felt possibly to be yeast related.  2 days after starting fluconazole she again developed a sore tongue, pain on the roof of her mouth and swelling of her lips.  The medication was stopped just in the last 2 days.  She still having some perioral irritation and discomfort but it seems to be gradually improving.  No dyspnea or chest pain.  She does continue to have nausea in her mid abdomen which is more present in the morning.  It does come and go.  Seems to be better if she eats or moves around.  She is using Metamucil once daily and also a probiotic.  She notices intermittently a bad taste in her mouth.  She is not having heartburn, dysphagia or odynophagia.  She reports her bowel movements are often incomplete and she can have an urge for defecation again later in the day.  She has had several accidents when she could not make it to the bathroom in time.  She is not having blood in her stool or melena.  She has been using  pantoprazole twice daily   Review of Systems As per HPI, otherwise negative  Current Medications, Allergies, Past Medical History, Past Surgical History, Family History and Social History were reviewed in [employer] record.     Objective:   Physical Exam BP 140/82   Pulse 77   Ht 5\' 1"  (1.549 m)   Wt 142 lb (64.4 kg)   LMP  (LMP Unknown)   SpO2 98%   BMI 26.83 kg/m  Gen: awake, alert, NAD HEENT: anicteric, slightly swollen upper and lower lip with shallow ulceration on her lower lip, there is redness and thinning of the skin on the hard palate, no white plaques on the tongue or buccal mucosa CV: RRR, no mrg Pulm: CTA b/l Abd: soft, NT/ND, +BS throughout Ext: no c/c/e Neuro: nonfocal       Assessment & Plan:  70 year old female with a history of GERD with Barrett's esophagus (short segment without dysplasia), history of chronic constipation with incomplete defecation, nonadvanced adenomatous colon polyps, prior DVT on Plavix, hypertension and hyperlipidemia is seen for follow-up.   1.  Perioral swelling/ulceration --this is consistent with an allergic reaction to fluconazole.  It has been added to her allergy list and the medication has been stopped.  I am going to prescribe Dukes mouthwash to use 3 times daily as needed  2.  Mid abdominal nausea symptom --this does not to seem to be acid peptic  in twice daily pantoprazole has not improved this symptom.  This has been going on for over a year and seems to be more in line with bowel movements.  The symptoms may really relate to incomplete defecation and result from chronic constipation.  See #3  3.  Chronic constipation/incomplete defecation --see #2 --MiraLAX purge --Increase Metamucil to twice daily --I have asked patient to let me know after purge and increase Metamucil if this symptom continues along with the "nausea".  If so consider CT scan of the abdomen pelvis with contrast  4.  GERD with  Barrett's esophagus --reduce pantoprazole to once daily as the "nausea" symptom discussed above not felt to be acid peptic --Recall EGD November 2022 for surveillance  5.  History of nonadvanced adenoma of the colon --surveillance recommended September 2023 based on the guidelines  30 minutes total spent today including patient facing time, coordination of care, reviewing medical history/procedures/pertinent radiology studies, and documentation of the encounter.

## 2020-04-27 NOTE — Patient Instructions (Signed)
We have sent the following medications to your pharmacy for you to pick up at your convenience: Magic Mouthwash- swish and swallow 5 ml three times daily x 5 days Pantoprazole 40 mg once daily (this is a decrease from your previous dosage) ______________________________________________________  Dr Hilarie Fredrickson recommends that you complete a bowel purge (to clean out your bowels). Please do the following: Purchase a 238 gram bottle of Miralax over the counter as well as a box of 5 mg dulcolax tablets and 32 ounces of gatorade. Take 4 dulcolax tablets. Wait 1 hour. You will then drink 6-8 capfuls of Miralax mixed in an 32 ounces of gatorade over the next 2-3 hours. You should expect results within 1 to 6 hours after completing the bowel purge.  ________________________________________________________  AFTER you have completed the bowel purge, you may start doubling your metamucil daily. _________________________________________________________  Please follow up in the office in 2-3 months if you continue to have mid abdominal discomfort or incomplete stooling. _________________________________________________________  If you are age 70 or older, your body mass index should be between 23-30. Your Body mass index is 26.83 kg/m. If this is out of the aforementioned range listed, please consider follow up with your Primary Care Provider.  If you are age 50 or younger, your body mass index should be between 19-25. Your Body mass index is 26.83 kg/m. If this is out of the aformentioned range listed, please consider follow up with your Primary Care Provider.   Due to recent changes in healthcare laws, you may see the results of your imaging and laboratory studies on MyChart before your provider has had a chance to review them.  We understand that in some cases there may be results that are confusing or concerning to you. Not all laboratory results come back in the same time frame and the provider may be  waiting for multiple results in order to interpret others.  Please give [place] 48 hours in order for your provider to thoroughly review all the results before contacting the office for clarification of your results.

## 2020-04-28 ENCOUNTER — Other Ambulatory Visit: Payer: Self-pay

## 2020-04-28 ENCOUNTER — Ambulatory Visit (HOSPITAL_BASED_OUTPATIENT_CLINIC_OR_DEPARTMENT_OTHER): Payer: Medicare HMO | Attending: Emergency Medicine | Admitting: Pulmonary Disease

## 2020-04-28 ENCOUNTER — Encounter (HOSPITAL_BASED_OUTPATIENT_CLINIC_OR_DEPARTMENT_OTHER): Payer: Self-pay | Admitting: Pulmonary Disease

## 2020-04-28 DIAGNOSIS — R0902 Hypoxemia: Secondary | ICD-10-CM | POA: Diagnosis not present

## 2020-04-28 DIAGNOSIS — I272 Pulmonary hypertension, unspecified: Secondary | ICD-10-CM | POA: Insufficient documentation

## 2020-04-28 DIAGNOSIS — R0683 Snoring: Secondary | ICD-10-CM | POA: Diagnosis not present

## 2020-04-28 DIAGNOSIS — Z72820 Sleep deprivation: Secondary | ICD-10-CM | POA: Diagnosis not present

## 2020-04-28 HISTORY — DX: Sleep deprivation: Z72.820

## 2020-04-29 DIAGNOSIS — G4736 Sleep related hypoventilation in conditions classified elsewhere: Secondary | ICD-10-CM

## 2020-04-29 DIAGNOSIS — Z72820 Sleep deprivation: Secondary | ICD-10-CM | POA: Diagnosis not present

## 2020-04-29 DIAGNOSIS — I272 Pulmonary hypertension, unspecified: Secondary | ICD-10-CM | POA: Diagnosis not present

## 2020-04-29 NOTE — Procedures (Signed)
    Patient Name: Vicki Page, Vicki Page Date: 04/28/2020 Gender: Female D.O.B: 1951/01/19 Age (years): 57 Referring Provider: Baltazar Apo Height (inches): 61 Interpreting Physician: Chesley Mires MD, ABSM Weight (lbs): 142 RPSGT: Carolin Coy BMI: 27 MRN: [id number] Neck Size: 12.50  CLINICAL INFORMATION Sleep Study Type: NPSG  Indication for sleep study: Pulmonary hypertension.  Epworth Sleepiness Score: 10  SLEEP STUDY TECHNIQUE As per the AASM Manual for the Scoring of Sleep and Associated Events v2.3 (April 2016) with a hypopnea requiring 4% desaturations.  The channels recorded and monitored were frontal, central and occipital EEG, electrooculogram (EOG), submentalis EMG (chin), nasal and oral airflow, thoracic and abdominal wall motion, anterior tibialis EMG, snore microphone, electrocardiogram, and pulse oximetry.  MEDICATIONS Medications self-administered by patient taken the night of the study : BUPROPION SR, ATORVASTATIN  SLEEP ARCHITECTURE The study was initiated at 11:08:21 PM and ended at 5:05:34 AM.  Sleep onset time was 15.5 minutes and the sleep efficiency was 79.5%%. The total sleep time was 284 minutes.  Stage REM latency was 82.5 minutes.  The patient spent 11.8%% of the night in stage N1 sleep, 71.3%% in stage N2 sleep, 0.0%% in stage N3 and 16.9% in REM.  Alpha intrusion was absent.  Supine sleep was 47.01%.  RESPIRATORY PARAMETERS The overall apnea/hypopnea index (AHI) was 1.7 per hour. There were 2 total apneas, including 2 obstructive, 0 central and 0 mixed apneas. There were 6 hypopneas and 13 RERAs.  The AHI during Stage REM sleep was 7.5 per hour.  AHI while supine was 3.6 per hour.  The mean oxygen saturation was 93.1%. The minimum SpO2 during sleep was 84.0%.  moderate snoring was noted during this study.  CARDIAC DATA The 2 lead EKG demonstrated sinus rhythm. The mean heart rate was 61.2 beats per minute. Other EKG findings  include: None.  LEG MOVEMENT DATA The total PLMS were 0 with a resulting PLMS index of 0.0. Associated arousal with leg movement index was 0.0 .  IMPRESSIONS - No significant obstructive sleep apnea.  The overall AHI was 1.7. - She has oxygen desaturation below 88% lasting more than 5 minutes in abscence of other respiratory events.  Her SpO2 low was 84%.  She had 1 liter supplemental oxygen added during the study with improvement in her oxygenation. - The patient snored with moderate snoring volume.  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 - Avoid alcohol, sedatives and other CNS depressants that may worsen sleep apnea and disrupt normal sleep architecture. - Sleep hygiene should be reviewed to assess factors that may improve sleep quality. - Weight management and regular exercise should be initiated or continued if appropriate.  [Electronically signed] 04/29/2020 10:59 AM  Chesley Mires MD, ABSM Diplomate, American Board of Sleep Medicine   NPI: [id number]

## 2020-05-01 ENCOUNTER — Other Ambulatory Visit (HOSPITAL_COMMUNITY): Payer: Medicare HMO

## 2020-05-04 ENCOUNTER — Encounter (HOSPITAL_COMMUNITY): Payer: Self-pay | Admitting: Emergency Medicine

## 2020-05-18 ENCOUNTER — Telehealth (HOSPITAL_COMMUNITY): Payer: Self-pay | Admitting: Emergency Medicine

## 2020-05-18 ENCOUNTER — Telehealth: Payer: Self-pay | Admitting: Emergency Medicine

## 2020-05-18 DIAGNOSIS — R0902 Hypoxemia: Secondary | ICD-10-CM

## 2020-05-18 NOTE — Telephone Encounter (Signed)
RB pt is calling about her results of the perfusion test, split night study and her ct results.  Please advise. Thanks

## 2020-05-18 NOTE — Telephone Encounter (Signed)
Just an FYI. We have made several attempts to contact this patient including sending a letter to schedule or reschedule their echocardiogram. We will be removing the patient from the echo [place].  05/01/20 NO SHOWED- MAILED LETTERLBW  03/18/20 [facility] to schedule @ 10:45/LBW        Thank you

## 2020-05-19 DIAGNOSIS — M25512 Pain in left shoulder: Secondary | ICD-10-CM | POA: Diagnosis not present

## 2020-05-20 NOTE — Telephone Encounter (Signed)
Her V/q scan was normal > did not show any evidence for blood clots  Her sleep study did not show OSA, but did show some transient decreased oxygen levels while sleeping. Based on this we should probably order 1L/min oxygen for her to wear while sleeping. Please order this if she agrees.   Her last lung CA screening CT was in 01/2020, was stable. She needs a repeat in 01/2021.

## 2020-05-21 NOTE — Telephone Encounter (Signed)
Attempted to call pt but line rang twice and then went to a busy signal. Tried to call pt back but received a busy signal again. Will try to call back later.

## 2020-05-25 NOTE — Telephone Encounter (Signed)
I have called and spoke with pt and she is aware of these results per RB.  She has agreed to use the oxgyen at night and the order has been sent in.

## 2020-05-25 NOTE — Addendum Note (Signed)
Addended by: Elie Confer on: 05/25/2020 05:41 PM   Modules accepted: Orders

## 2020-05-28 DIAGNOSIS — R0902 Hypoxemia: Secondary | ICD-10-CM | POA: Diagnosis not present

## 2020-05-28 DIAGNOSIS — I270 Primary pulmonary hypertension: Secondary | ICD-10-CM | POA: Diagnosis not present

## 2020-05-28 DIAGNOSIS — I2729 Other secondary pulmonary hypertension: Secondary | ICD-10-CM | POA: Diagnosis not present

## 2020-06-04 ENCOUNTER — Emergency Department (HOSPITAL_COMMUNITY): Payer: Medicare HMO

## 2020-06-04 ENCOUNTER — Encounter (HOSPITAL_COMMUNITY): Payer: Self-pay | Admitting: Emergency Medicine

## 2020-06-04 ENCOUNTER — Emergency Department (HOSPITAL_COMMUNITY)
Admission: EM | Admit: 2020-06-04 | Discharge: 2020-06-04 | Disposition: A | Discharge status: Home / Self Care | Payer: Medicare HMO | Attending: Emergency Medicine | Admitting: Emergency Medicine

## 2020-06-04 ENCOUNTER — Other Ambulatory Visit: Payer: Self-pay

## 2020-06-04 DIAGNOSIS — R079 Chest pain, unspecified: Secondary | ICD-10-CM | POA: Diagnosis not present

## 2020-06-04 DIAGNOSIS — E119 Type 2 diabetes mellitus without complications: Secondary | ICD-10-CM | POA: Insufficient documentation

## 2020-06-04 DIAGNOSIS — Z7902 Long term (current) use of antithrombotics/antiplatelets: Secondary | ICD-10-CM | POA: Diagnosis not present

## 2020-06-04 DIAGNOSIS — Z7982 Long term (current) use of aspirin: Secondary | ICD-10-CM | POA: Insufficient documentation

## 2020-06-04 DIAGNOSIS — F17210 Nicotine dependence, cigarettes, uncomplicated: Secondary | ICD-10-CM | POA: Diagnosis not present

## 2020-06-04 DIAGNOSIS — M19012 Primary osteoarthritis, left shoulder: Secondary | ICD-10-CM | POA: Diagnosis not present

## 2020-06-04 DIAGNOSIS — I10 Essential (primary) hypertension: Secondary | ICD-10-CM | POA: Diagnosis not present

## 2020-06-04 DIAGNOSIS — M25512 Pain in left shoulder: Secondary | ICD-10-CM | POA: Insufficient documentation

## 2020-06-04 DIAGNOSIS — Z79899 Other long term (current) drug therapy: Secondary | ICD-10-CM | POA: Diagnosis not present

## 2020-06-04 DIAGNOSIS — E039 Hypothyroidism, unspecified: Secondary | ICD-10-CM | POA: Insufficient documentation

## 2020-06-04 DIAGNOSIS — R072 Precordial pain: Secondary | ICD-10-CM | POA: Insufficient documentation

## 2020-06-04 LAB — CBC
HCT: 44.3 % (ref 36.0–46.0)
Hemoglobin: 14.1 g/dL (ref 12.0–15.0)
MCH: 28.4 pg (ref 26.0–34.0)
MCHC: 31.8 g/dL (ref 30.0–36.0)
MCV: 89.3 fL (ref 80.0–100.0)
Platelets: 244 10*3/uL (ref 150–400)
RBC: 4.96 MIL/uL (ref 3.87–5.11)
RDW: 15.7 % — ABNORMAL HIGH (ref 11.5–15.5)
WBC: 6.4 10*3/uL (ref 4.0–10.5)
nRBC: 0 % (ref 0.0–0.2)

## 2020-06-04 LAB — BASIC METABOLIC PANEL
Anion gap: 6 (ref 5–15)
BUN: 16 mg/dL (ref 8–23)
CO2: 27 mmol/L (ref 22–32)
Calcium: 9.2 mg/dL (ref 8.9–10.3)
Chloride: 107 mmol/L (ref 98–111)
Creatinine, Ser: 1.01 mg/dL — ABNORMAL HIGH (ref 0.44–1.00)
GFR, Estimated: 60 mL/min — ABNORMAL LOW (ref >60)
Glucose, Bld: 65 mg/dL — ABNORMAL LOW (ref 70–99)
Potassium: 4.4 mmol/L (ref 3.5–5.1)
Sodium: 140 mmol/L (ref 135–145)

## 2020-06-04 LAB — TROPONIN I (HIGH SENSITIVITY)
Troponin I (High Sensitivity): 7 ng/L (ref <18)
Troponin I (High Sensitivity): 8 ng/L (ref <18)

## 2020-06-04 NOTE — ED Triage Notes (Signed)
Pt complains of pain in left arm for one week. Pt states today she developed CP. Pt feels worse when she exerts herself.

## 2020-06-04 NOTE — Discharge Instructions (Addendum)
You came to the emergency department today to be evaluated for your chest pain and left arm pain.  Your EKG and troponin were reassuring however due to your cardiac risk factors you will need close follow-up with your primary care provider and cardiologist.  Please call both of these providers tomorrow to schedule appointments.  The x-ray of your left shoulder showed mild degenerative changes.  Please follow-up with your orthopedic physician.    Get help right away if: Your chest pain gets worse. You have a cough that gets worse, or you cough up blood. You have severe pain in your abdomen. You faint. You have sudden, unexplained chest discomfort. You have sudden, unexplained discomfort in your arms, back, neck, or jaw. You have shortness of breath at any time. You suddenly start to sweat, or your skin gets clammy. You feel nausea or you vomit. You suddenly feel lightheaded or dizzy. You have severe weakness, or unexplained weakness or fatigue. Your heart begins to beat quickly, or it feels like it is skipping beats.

## 2020-06-04 NOTE — ED Notes (Signed)
Patient transported to X-ray 

## 2020-06-04 NOTE — ED Provider Notes (Signed)
[facility] EMERGENCY DEPARTMENT Provider Note   CSN: [id number] Arrival date & time: 06/04/20  1403     History Chief Complaint  Patient presents with  . Chest Pain    Vicki Page is a 70 y.o. female with a history of hypertension, hyperlipidemia, diabetes type 2, aneurysm, carotid stenosis.  Presents with a chief complaint of chest pain.  Patient reports her first episode of chest pain occurred yesterday morning after waking.  Patient describes this as a pulling and tightness in her chest.  Initial episode lasted approximately 5 minutes and then resolved.  Patient reports that upon waking in this morning she had a similar episode of chest pulling and tightness.  Pain today lasted approximately 5 to 10 minutes and was more severe.  Since then chest discomfort has resolved.  Patient denied any change in chest discomfort or reproduction of symptoms with exertion.  Denies any radiation of her chest pain.  She denies any associated shortness of breath, nausea, vomiting, diaphoresis.  Patient also complains of left arm pain.  Reports this has been present over the last 2 weeks.  Patient reports the pain starts in her shoulder moved down to her elbow.  Pain is worse with movement.  Patient endorses weakness of that arm due to the pain.  Patient denies any numbness or tingling sensation to her affected arm.  She denies any swelling, color change, or rash to this affected arm.  Patient denies any change in arm pain with exertion.  Patient is right-hand dominant.  Patient denies any recent falls or injuries.      HPI  HPI: A 70 year old patient with a history of treated diabetes, hypertension and hypercholesterolemia presents for evaluation of chest pain. Initial onset of pain was approximately 3-6 hours ago. The patient's chest pain is well-localized, is described as heaviness/pressure/tightness and is worse with exertion. The patient's chest pain is middle- or left-sided, is  not sharp and does not radiate to the arms/jaw/neck. The patient does not complain of nausea and denies diaphoresis. The patient has smoked in the past 90 days and has a family history of coronary artery disease in a first-degree relative with onset less than age 35. The patient has no history of stroke, has no history of peripheral artery disease and does not have an elevated BMI (>=30).   Past Medical History:  Diagnosis Date  . Adenomatous colon polyp   . Aneurysm ([place])    brain x 2  . Anxiety   . Arthritis   . Atherosclerosis   . Barrett's esophagus   . Carotid artery occlusion   . Cataracts, bilateral    just watching  . DDD (degenerative disc disease), lumbar   . Depression   . Diverticulosis   . DVT (deep venous thrombosis) ([facility])    left leg  . Fall at home May 30, 2014   Pt slipped in tub- hurt left hip and right shoulder  . GERD (gastroesophageal reflux disease)   . H. pylori infection   . Hyperlipidemia   . Hypertension   . Hypothyroidism   . IBS (irritable bowel syndrome)   . Osteoarthritis   . Poor sleep 04/28/2020  . Pre-diabetes    no meds, diet controlled  . RLS (restless legs syndrome)   . Tubular adenoma of colon   . Ulcer of the stomach and intestine     Patient Active Problem List   Diagnosis Date Noted  . Poor sleep 04/28/2020  . Other secondary  pulmonary hypertension ([place]) 03/11/2020  . Bad taste in mouth 04/26/2019  . Brown tongue 04/26/2019  . Nausea without vomiting 04/26/2019  . Constipation   . Brain aneurysm   . Carotid stenosis   . Cephalalgia   . Smoker 01/03/2014  . Occlusion and stenosis of carotid artery without mention of cerebral infarction 01/26/2012  . Aneurysm ([place])   . Hyperthyroidism   . Hyperlipidemia     Past Surgical History:  Procedure Laterality Date  . ANAL RECTAL MANOMETRY N/A 05/04/2016   Procedure: ANO RECTAL MANOMETRY;  Surgeon: Mauri Pole, MD;  Location: [facility];  Service: Endoscopy;  Laterality:  N/A;  . ANEURYSM COILING  July 26, 2010   stent  . BRAIN SURGERY     Per patient " Left side behind eyes  . INSERTION OF ILIAC STENT  10/04/2012   Procedure: INSERTION OF ILIAC STENT;  Surgeon: Jettie Booze, MD;  Location: [facility] CATH LAB;  Service: Cardiovascular;;  Left External Iliac Artery  . IR ANGIO INTRA EXTRACRAN SEL COM CAROTID INNOMINATE BILAT MOD SED  11/23/2018  . IR ANGIO VERTEBRAL SEL SUBCLAVIAN INNOMINATE UNI L MOD SED  11/23/2018  . IR ANGIO VERTEBRAL SEL VERTEBRAL UNI L MOD SED  12/10/2018  . IR ANGIO VERTEBRAL SEL VERTEBRAL UNI R MOD SED  11/23/2018  . IR GENERIC HISTORICAL  11/03/2015   IR ANGIO VERTEBRAL SEL VERTEBRAL UNI L MOD SED 11/03/2015 Luanne Bras, MD [facility]  . IR GENERIC HISTORICAL  11/03/2015   IR ANGIO INTRA EXTRACRAN SEL COM CAROTID INNOMINATE BILAT MOD SED 11/03/2015 Luanne Bras, MD [facility]  . IR GENERIC HISTORICAL  11/03/2015   IR ANGIO VERTEBRAL SEL SUBCLAVIAN INNOMINATE UNI R MOD SED 11/03/2015 Luanne Bras, MD [facility]  . IR GENERIC HISTORICAL  11/19/2015   IR RADIOLOGIST EVAL & MGMT 11/19/2015 [facility]  . IR US GUIDE VASC ACCESS RIGHT  11/23/2018  . LEG SURGERY    . LOWER EXTREMITY ANGIOGRAM Left 10-04-12   and Left iliac stent  . LOWER EXTREMITY ANGIOGRAM N/A 10/04/2012   Procedure: LOWER EXTREMITY ANGIOGRAM;  Surgeon: Jettie Booze, MD;  Location: [facility] CATH LAB;  Service: Cardiovascular;  Laterality: N/A;  . RADIOLOGY WITH ANESTHESIA N/A 12/10/2018   Procedure: STENTING;  Surgeon: Luanne Bras, MD;  Location: [facility];  Service: Radiology;  Laterality: N/A;  . TUBAL LIGATION       OB History    Gravida  2   Para  2   Term  2   Preterm      AB      Living  2     SAB      IAB      Ectopic      Multiple      Live Births              Family History  Problem Relation Age of Onset  . Deep vein thrombosis Mother   . Diabetes Mother        Amputation  . Heart disease Mother         Heart Disease before age 40  . Hyperlipidemia Mother   . Hypertension Mother   . Heart attack Mother   . Stroke Mother   . Thyroid disease Mother   . Heart disease Father        Heart Disease before age 60  . Hypertension Father   . Heart attack Father   . Hyperlipidemia Father   . Heart disease  Brother        Heart Disease before age 51  . Hyperlipidemia Brother   . Hypertension Brother   . Heart attack Brother   . COPD Brother   . Diabetes Brother   . Colonic polyp Brother   . Colon cancer Neg Hx   . Stomach cancer Neg Hx     Social History   Tobacco Use  . Smoking status: Current Every Day Smoker    Packs/day: 0.25    Types: Cigarettes  . Smokeless tobacco: Never Used  . Tobacco comment: 5 cigarettes smoked daily [facility] 03/11/20  Vaping Use  . Vaping Use: Former  Substance Use Topics  . Alcohol use: No    Alcohol/week: 0.0 standard drinks  . Drug use: No    Home Medications Prior to Admission medications   Medication Sig Start Date End Date Taking? Authorizing Provider  acetaminophen (TYLENOL) 500 MG tablet Take 1,000 mg by mouth 2 (two) times daily as needed for pain.     [provider]  amLODipine (NORVASC) 10 MG tablet Take 10 mg by mouth daily. for high blood pressure 10/08/18   [provider]  aspirin EC 81 MG tablet Take 81 mg by mouth daily.    [provider]  buPROPion (WELLBUTRIN SR) 150 MG 12 hr tablet Take 150 mg by mouth 2 (two) times daily.    [provider]  Calcium-Vitamin D (CALTRATE 600 PLUS-VIT D PO) Take 1 tablet by mouth daily before supper.     [provider]  Cholecalciferol (VITAMIN D) 2000 UNITS CAPS Take 2,000 Units by mouth daily before supper.     [provider]  cilostazol (PLETAL) 50 MG tablet Take 50 mg by mouth 2 (two) times daily. 10/30/18   [provider]  clopidogrel (PLAVIX) 75 MG tablet Take 1 tablet (75 mg total) by mouth daily with breakfast. 10/05/12   Jettie Booze, MD  D[facility]) SUSP Swish and swallow 5 ml three times daily x 5 days 04/27/20   Pyrtle, Lajuan Lines, MD  hydrochlorothiazide (MICROZIDE) 12.5 MG capsule Take 12.5 mg by mouth daily. 10/05/18   [provider]  levothyroxine (SYNTHROID, LEVOTHROID) 25 MCG tablet Take 25 mcg by mouth daily before breakfast.     [provider]  Magnesium 250 MG TABS Take 250 mg by mouth daily before supper.    [provider]  Multiple Vitamin (MULTIVITAMIN WITH MINERALS) TABS tablet Take 1 tablet by mouth daily before supper. Centrum Silver    [provider]  nystatin (MYCOSTATIN) 100000 UNIT/ML suspension Swish and swallow 5 ml three times daily. Patient not taking: Reported on 04/27/2020 04/25/19   Zehr, Laban Emperor, PA-C  pantoprazole (PROTONIX) 40 MG tablet Take 1 tablet (40 mg total) by mouth daily. 04/27/20   Pyrtle, Lajuan Lines, MD  potassium gluconate 595 MG TABS Take 595 mg by mouth daily before supper.     [provider]  Probiotic Product (PROBIOTIC PO) Take 1 tablet by mouth daily after lunch.     [provider]  Psyllium (METAMUCIL) 28.3 % POWD Take 1 Scoop by mouth daily.    [provider]  simvastatin (ZOCOR) 80 MG tablet Take 80 mg by mouth daily with supper.     [provider]  vitamin B-12 (CYANOCOBALAMIN) 500 MCG tablet Take 500 mcg by mouth daily.    [provider]    Allergies    Fluconazole, Ropinirole, Codeine, Darvon [propoxyphene hcl], and  Flagyl [metronidazole]  Review of Systems   Review of Systems  Constitutional: Negative for chills, diaphoresis and fever.  Eyes: Negative for visual disturbance.  Respiratory: Negative for shortness of breath.   Cardiovascular: Positive for chest pain. Negative for palpitations and leg swelling.  Gastrointestinal: Negative for abdominal pain, nausea and vomiting.  Genitourinary: Negative for difficulty urinating, dysuria and  hematuria.  Musculoskeletal: Positive for arthralgias and myalgias. Negative for back pain, joint swelling, neck pain and neck stiffness.  Skin: Negative for color change, pallor, rash and wound.  Neurological: Negative for dizziness, syncope, weakness, light-headedness, numbness and headaches.  Psychiatric/Behavioral: Negative for confusion.    Physical Exam Updated Vital Signs BP (!) 141/64   Pulse 67   Temp 98.3 F (36.8 C) (Oral)   Resp 18   LMP  (LMP Unknown)   SpO2 96%   Physical Exam Vitals and nursing note reviewed.  Constitutional:      General: She is not in acute distress.    Appearance: She is not ill-appearing, toxic-appearing or diaphoretic.  HENT:     Head: Normocephalic.  Eyes:     General: No scleral icterus.       Right eye: No discharge.        Left eye: No discharge.  Cardiovascular:     Rate and Rhythm: Normal rate.     Pulses:          Radial pulses are 3+ on the right side and 3+ on the left side.     Heart sounds: Normal heart sounds.  Pulmonary:     Effort: Pulmonary effort is normal. No tachypnea, bradypnea or respiratory distress.     Breath sounds: Normal breath sounds. No stridor.  Abdominal:     Palpations: Abdomen is soft.     Tenderness: There is no abdominal tenderness.  Musculoskeletal:     Right shoulder: No swelling, deformity, effusion, laceration, tenderness, bony tenderness or crepitus. Normal range of motion.     Left shoulder: Tenderness and bony tenderness present. No swelling, deformity, effusion, laceration or crepitus. Decreased range of motion.     Cervical back: Normal range of motion and neck supple. No edema, erythema, signs of trauma, rigidity, torticollis or crepitus. No pain with movement, spinous process tenderness or muscular tenderness. Normal range of motion.     Right lower leg: No tenderness. No edema.     Left lower leg: No tenderness. No edema.     Comments: Patient has decreased range of motion to left shoulder  due to complaints of pain.  Patient has tenderness to left trapezius muscle, left deltoid muscle, and proximal left humerus..  Patient has no pain on passive range of motion to left shoulder.  Pulse, motor, sensation intact to left wrist and hand.  Skin:    General: Skin is warm and dry.     Coloration: Skin is not cyanotic or pale.  Neurological:     General: No focal deficit present.     Mental Status: She is alert.  Psychiatric:        Behavior: Behavior is cooperative.     ED Results / Procedures / Treatments   Labs (all labs ordered are listed, but only abnormal results are displayed) Labs Reviewed  BASIC METABOLIC PANEL - Abnormal; Notable for the following components:      Result Value   Glucose, Bld 65 (*)    Creatinine, Ser 1.01 (*)    GFR, Estimated 60 (*)    All other components  within normal limits  CBC - Abnormal; Notable for the following components:   RDW 15.7 (*)    All other components within normal limits  TROPONIN I (HIGH SENSITIVITY)  TROPONIN I (HIGH SENSITIVITY)    EKG EKG Interpretation  Date/Time:  Thursday June 04 2020 14:46:18 EDT Ventricular Rate:  63 PR Interval:  132 QRS Duration: 78 QT Interval:  416 QTC Calculation: 425 R Axis:   21 Text Interpretation: Normal sinus rhythm Cannot rule out Anterior infarct , age undetermined Abnormal ECG No significant change since last tracing Confirmed by Gareth Morgan [phone]) on 06/04/2020 5:06:09 PM   Radiology DG Chest 2 View  Result Date: 06/04/2020 CLINICAL DATA:  Chest pain. EXAM: CHEST - 2 VIEW COMPARISON:  March 17, 2020. FINDINGS: The heart size and mediastinal contours are within normal limits. Both lungs are clear. The visualized skeletal structures are unremarkable. IMPRESSION: No active cardiopulmonary disease. Electronically Signed   By: Marijo Conception M.D.   On: 06/04/2020 15:54    Procedures Procedures  Medications Ordered in ED Medications - No data to display  ED Course  I  have reviewed the triage vital signs and the nursing notes.  Pertinent labs & imaging results that were available during my care of the patient were reviewed by me and considered in my medical decision making (see chart for details).    MDM Rules/Calculators/A&P HEAR Score: 5                        Alert 70 year old female no acute distress, nontoxic-appearing.  Patient presents with complaint of chest pain and left arm pain.  These symptoms appear to be 2 separate complaints as patient has been having left shoulder pain over the last 2 weeks.    Patient reports that chest pain began this morning upon waking.  Was described as a pulling sensation in the left side of her chest.  Lasted for approximately 5 to 10 minutes and then resolved.  Patient had no associated nausea, vomiting, shortness of breath, or diaphoresis.  Patient denied any change in her symptoms with exertion and no reproduction of symptoms with exertion.  Patient's vital signs hemodynamically stable.  Chest pain work-up ordered while patient was in triage.  Chest x-ray showed no active cardiopulmonary disease. EKG shows normal sinus rhythm with no significant change from previous tracing. Troponin 7 and 8 with delta of +1. Due to patient's increased heart score discussed admission for observation versus discharge with close follow-up.  Patient does not want to be admitted to the hospital at this time.  She agrees to follow-up with her primary care provider as well as her cardiologist.  Patient had no swelling, erythema, or deformity noted to left upper extremity.  Patient has tenderness to left trapezius muscle, left deltoid muscle, and proximal left humerus.  Patient patient has decreased range of motion to left shoulder secondary to complaints of pain.  Patient had no pain on passive range of motion.  Will obtain x-ray to evaluate for possible fracture due to patient's tenderness and complaints of pain. Left shoulder x-ray showed  no acute findings.  Mild degenerative AC joint arthropathy. Left shoulder pain may be due to musculoskeletal injury, degenerative changes, or radiculopathy.  Patient advises that she is following up with orthopedic provider tomorrow.  Discussed results, findings, treatment and follow up. Patient advised of return precautions. Patient verbalized understanding and agreed with plan.   Final Clinical Impression(s) / ED Diagnoses Final diagnoses:  Precordial chest pain  Acute pain of left shoulder    Rx / DC Orders ED Discharge Orders    None       Dyann Ruddle 06/05/20 0245    Gareth Morgan, MD 06/05/20 [phone]

## 2020-06-04 NOTE — ED Provider Notes (Signed)
Emergency Medicine Provider Triage Evaluation Note  Vicki Page, a 70 y.o. female evaluated in triage.  Pt complains of 1 week left arm pain, cp started today. CP is tightness, left chest. Constant pain in arm. Nausea yesterday.   BP (!) 143/67 (BP Location: Right Arm)   Pulse 67   Temp 98.3 F (36.8 C) (Oral)   Resp 14   LMP  (LMP Unknown)   SpO2 99%   Patient is alert, no acute distress, normal work of breathing    Medically screening exam initiated at 2:37 PM. Appropriate orders placed.  Vicki Page was informed that the remainder of the evaluation will be completed by another provider, this initial triage assessment does not replace that evaluation, and the importance of remaining in the ED until their evaluation is complete.       Shylo Zamor, [place] N, PA-C 06/04/20 1439    Carmin Muskrat, MD 06/12/20 [phone]

## 2020-06-05 DIAGNOSIS — I10 Essential (primary) hypertension: Secondary | ICD-10-CM | POA: Diagnosis not present

## 2020-06-08 DIAGNOSIS — M7542 Impingement syndrome of left shoulder: Secondary | ICD-10-CM | POA: Diagnosis not present

## 2020-06-08 DIAGNOSIS — M7552 Bursitis of left shoulder: Secondary | ICD-10-CM | POA: Diagnosis not present

## 2020-06-16 DIAGNOSIS — F324 Major depressive disorder, single episode, in partial remission: Secondary | ICD-10-CM | POA: Diagnosis not present

## 2020-06-16 DIAGNOSIS — E039 Hypothyroidism, unspecified: Secondary | ICD-10-CM | POA: Diagnosis not present

## 2020-06-16 DIAGNOSIS — E7800 Pure hypercholesterolemia, unspecified: Secondary | ICD-10-CM | POA: Diagnosis not present

## 2020-06-16 DIAGNOSIS — E119 Type 2 diabetes mellitus without complications: Secondary | ICD-10-CM | POA: Diagnosis not present

## 2020-06-16 DIAGNOSIS — N183 Chronic kidney disease, stage 3 unspecified: Secondary | ICD-10-CM | POA: Diagnosis not present

## 2020-06-16 DIAGNOSIS — E118 Type 2 diabetes mellitus with unspecified complications: Secondary | ICD-10-CM | POA: Diagnosis not present

## 2020-06-16 DIAGNOSIS — K219 Gastro-esophageal reflux disease without esophagitis: Secondary | ICD-10-CM | POA: Diagnosis not present

## 2020-06-16 DIAGNOSIS — I10 Essential (primary) hypertension: Secondary | ICD-10-CM | POA: Diagnosis not present

## 2020-06-16 DIAGNOSIS — M1990 Unspecified osteoarthritis, unspecified site: Secondary | ICD-10-CM | POA: Diagnosis not present

## 2020-06-25 ENCOUNTER — Other Ambulatory Visit: Payer: Self-pay

## 2020-06-25 ENCOUNTER — Ambulatory Visit (HOSPITAL_COMMUNITY): Payer: Medicare HMO | Attending: Cardiology

## 2020-06-25 DIAGNOSIS — I270 Primary pulmonary hypertension: Secondary | ICD-10-CM | POA: Diagnosis not present

## 2020-06-25 LAB — ECHOCARDIOGRAM COMPLETE
Area-P 1/2: 2.68 cm2
S' Lateral: 2.3 cm

## 2020-06-27 DIAGNOSIS — R0902 Hypoxemia: Secondary | ICD-10-CM | POA: Diagnosis not present

## 2020-06-27 DIAGNOSIS — I270 Primary pulmonary hypertension: Secondary | ICD-10-CM | POA: Diagnosis not present

## 2020-06-27 DIAGNOSIS — I2729 Other secondary pulmonary hypertension: Secondary | ICD-10-CM | POA: Diagnosis not present

## 2020-07-06 NOTE — Telephone Encounter (Signed)
RB please advise on the results of her ECHO.    Please, can someone give me a call regarding all the test or x-ray referrals starting back in February, until May 19th? I get bills pay request a week after the appointments, but I DO NOT get any explanation in layman's terms of the test. Doesn't seem fair at all, to pay for something in which you don't  even know the gist of the test, just the amount you're  being charged. No: [phone], PLEASE.

## 2020-07-07 DIAGNOSIS — I10 Essential (primary) hypertension: Secondary | ICD-10-CM | POA: Diagnosis not present

## 2020-07-08 NOTE — Telephone Encounter (Signed)
It looks like we have spoken to her about all the testing that we have performed (see phone notes from 04/06/20 and then 05/18/20) except the echocardiogram done on 06/25/20.   The echo shows normal left heart function and squeeze strength. There is no evidence for pulmonary hypertension or right heart strain (as had been suggested by her prior chest CT). This is good news.

## 2020-07-15 DIAGNOSIS — M75112 Incomplete rotator cuff tear or rupture of left shoulder, not specified as traumatic: Secondary | ICD-10-CM | POA: Diagnosis not present

## 2020-07-16 DIAGNOSIS — H524 Presbyopia: Secondary | ICD-10-CM | POA: Diagnosis not present

## 2020-07-19 DIAGNOSIS — L089 Local infection of the skin and subcutaneous tissue, unspecified: Secondary | ICD-10-CM | POA: Diagnosis not present

## 2020-07-19 DIAGNOSIS — B372 Candidiasis of skin and nail: Secondary | ICD-10-CM | POA: Diagnosis not present

## 2020-07-20 ENCOUNTER — Telehealth: Payer: Self-pay | Admitting: Emergency Medicine

## 2020-07-20 NOTE — Telephone Encounter (Signed)
Spoke with the pt  She is asking about ECHO results and had many questions about dx, plan, ect  I scheduled appt for 07/29/20 to go over results and have her questions answered  Nothing further needed at this time

## 2020-07-21 ENCOUNTER — Telehealth (HOSPITAL_COMMUNITY): Payer: Self-pay

## 2020-07-21 NOTE — Telephone Encounter (Signed)
Per patient request, faxed records from surgery 02/2010 to [facility]). AW

## 2020-07-28 DIAGNOSIS — I2729 Other secondary pulmonary hypertension: Secondary | ICD-10-CM | POA: Diagnosis not present

## 2020-07-28 DIAGNOSIS — R0902 Hypoxemia: Secondary | ICD-10-CM | POA: Diagnosis not present

## 2020-07-28 DIAGNOSIS — I270 Primary pulmonary hypertension: Secondary | ICD-10-CM | POA: Diagnosis not present

## 2020-07-29 ENCOUNTER — Encounter: Payer: Self-pay | Admitting: Primary Care

## 2020-07-29 ENCOUNTER — Ambulatory Visit (INDEPENDENT_AMBULATORY_CARE_PROVIDER_SITE_OTHER): Payer: Medicare HMO | Admitting: Primary Care

## 2020-07-29 ENCOUNTER — Other Ambulatory Visit: Payer: Self-pay

## 2020-07-29 DIAGNOSIS — I2729 Other secondary pulmonary hypertension: Secondary | ICD-10-CM

## 2020-07-29 DIAGNOSIS — F17200 Nicotine dependence, unspecified, uncomplicated: Secondary | ICD-10-CM

## 2020-07-29 DIAGNOSIS — G4734 Idiopathic sleep related nonobstructive alveolar hypoventilation: Secondary | ICD-10-CM

## 2020-07-29 DIAGNOSIS — F17210 Nicotine dependence, cigarettes, uncomplicated: Secondary | ICD-10-CM | POA: Diagnosis not present

## 2020-07-29 NOTE — Progress Notes (Signed)
@Patient  ID: Vicki Page, female    DOB: 1950/05/12, 70 y.o.   MRN: [id number]  Chief Complaint  Patient presents with   Follow-up    Echo Results. 06/25/20    Referring provider: Shirline Frees, MD  HPI: 70 year old female, every day smoker. PMH significant for secondary pulmonary hypertension, carotid stenosis, brain aneurysm, hyperthyroidism. Patient of Dr. Lamonte Sakai, last seen in February 2022 for pulmonary HTN.   07/29/2020 Patient presents today to review testing results. She is doing well, no acute complaints. Denies shortness of breath, wheezing or chest tightness. Her testing has mostly come back normal. She had a sleep study that showed no evidence of OSA but did show transient hypoxemia. She has been wearing 1L oxygen at night. Echocardiogram did not show any evidence of pulmonary hypertension. She still needs pulmonary function testing. Repeat CT chest to monitor lung nodule is due in December 2022. She is still smoking, she has quit in the past 5 different times. She is open to cutting back the amount she smokes and may consider picking a quit date.   Testing:  06/25/20 ECHO- EF 65-70%, mild LVH, grade 1 DD, normal PA systolic pressure  06/12/95 CXR- No active cardiopulmonary disease  03/17/20 VQ scan - normal examination, no evidence of PE  04/28/20 Split night sleep study- no evidence of OSA, transient hypoxemia    Allergies  Allergen Reactions   Fluconazole     Mouth swelling and soreness    Ropinirole Nausea And Vomiting   Codeine Nausea And Vomiting   Darvon [Propoxyphene Hcl] Nausea And Vomiting   Flagyl [Metronidazole] Nausea And Vomiting    Immunization History  Administered Date(s) Administered   Influenza-Unspecified 11/18/2015, 03/07/2017, 11/25/2019   Moderna Sars-Covid-2 Vaccination 04/15/2019, 05/17/2019   Pneumococcal Conjugate-13 11/18/2015   Pneumococcal Polysaccharide-23 05/23/2012   Tdap 03/19/2007, 03/07/2017   Zoster, Live 06/10/2019    Past  Medical History:  Diagnosis Date   Adenomatous colon polyp    Aneurysm ([place])    brain x 2   Anxiety    Arthritis    Atherosclerosis    Barrett's esophagus    Carotid artery occlusion    Cataracts, bilateral    just watching   DDD (degenerative disc disease), lumbar    Depression    Diverticulosis    DVT (deep venous thrombosis) ([place])    left leg   Fall at home May 30, 2014   Pt slipped in tub- hurt left hip and right shoulder   GERD (gastroesophageal reflux disease)    H. pylori infection    Hyperlipidemia    Hypertension    Hypothyroidism    IBS (irritable bowel syndrome)    Osteoarthritis    Poor sleep 04/28/2020   Pre-diabetes    no meds, diet controlled   RLS (restless legs syndrome)    Tubular adenoma of colon    Ulcer of the stomach and intestine     Tobacco History: Social History   Tobacco Use  Smoking Status Every Day   Packs/day: 0.25   Pack years: 0.00   Types: Cigarettes  Smokeless Tobacco Never  Tobacco Comments   5 cigarettes smoked daily ARJ 03/11/20   Ready to quit: Not Answered Counseling given: Not Answered Tobacco comments: 5 cigarettes smoked daily ARJ 03/11/20   Outpatient Medications Prior to Visit  Medication Sig Dispense Refill   acetaminophen (TYLENOL) 500 MG tablet Take 1,000 mg by mouth 2 (two) times daily as needed for pain.  amLODipine (NORVASC) 10 MG tablet Take 10 mg by mouth daily. for high blood pressure     aspirin EC 81 MG tablet Take 81 mg by mouth daily.     buPROPion (WELLBUTRIN SR) 150 MG 12 hr tablet Take 150 mg by mouth 2 (two) times daily.     Calcium-Vitamin D (CALTRATE 600 PLUS-VIT D PO) Take 1 tablet by mouth daily before supper.      Cholecalciferol (VITAMIN D) 2000 UNITS CAPS Take 2,000 Units by mouth daily before supper.      cilostazol (PLETAL) 50 MG tablet Take 50 mg by mouth 2 (two) times daily.     clopidogrel (PLAVIX) 75 MG tablet Take 1 tablet (75 mg total) by mouth daily with breakfast. 30 tablet 2    DPH-Lido-AlHydr-MgHydr-Simeth (FIRST-MOUTHWASH BLM) SUSP Swish and swallow 5 ml three times daily x 5 days 100 mL 0   fluconazole (DIFLUCAN) 150 MG tablet Take 150 mg by mouth daily.     hydrochlorothiazide (MICROZIDE) 12.5 MG capsule Take 12.5 mg by mouth daily.     levothyroxine (SYNTHROID, LEVOTHROID) 25 MCG tablet Take 25 mcg by mouth daily before breakfast.      Magnesium 250 MG TABS Take 250 mg by mouth daily before supper.     Multiple Vitamin (MULTIVITAMIN WITH MINERALS) TABS tablet Take 1 tablet by mouth daily before supper. Centrum Silver     nystatin (MYCOSTATIN) 100000 UNIT/ML suspension Swish and swallow 5 ml three times daily. 200 mL 0   nystatin (MYCOSTATIN/NYSTOP) powder Apply 1 application topically 2 (two) times daily as needed.     pantoprazole (PROTONIX) 40 MG tablet Take 1 tablet (40 mg total) by mouth daily. 30 tablet 2   potassium gluconate 595 MG TABS Take 595 mg by mouth daily before supper.      Probiotic Product (PROBIOTIC PO) Take 1 tablet by mouth daily after lunch.      Psyllium (METAMUCIL) 28.3 % POWD Take 1 Scoop by mouth daily.     simvastatin (ZOCOR) 80 MG tablet Take 80 mg by mouth daily with supper.      vitamin B-12 (CYANOCOBALAMIN) 500 MCG tablet Take 500 mcg by mouth daily.     No facility-administered medications prior to visit.      Review of Systems  Review of Systems  Constitutional: Negative.   HENT:  Positive for postnasal drip.   Respiratory:  Negative for cough, chest tightness, shortness of breath and wheezing.   Cardiovascular: Negative.     Physical Exam  BP 116/74 (BP Location: Right Arm, Patient Position: Sitting, Cuff Size: Normal)   Pulse 73   Temp 98.7 F (37.1 C) (Oral)   Ht 5\' 2"  (1.575 m)   Wt 143 lb (64.9 kg)   LMP  (LMP Unknown)   SpO2 99%   BMI 26.16 kg/m  Physical Exam Constitutional:      Appearance: Normal appearance.  HENT:     Head: Normocephalic and atraumatic.     Mouth/Throat:     Mouth: Mucous  membranes are moist.     Pharynx: Oropharynx is clear.  Cardiovascular:     Rate and Rhythm: Normal rate and regular rhythm.  Pulmonary:     Effort: Pulmonary effort is normal.     Breath sounds: Normal breath sounds. No wheezing, rhonchi or rales.     Comments: O2 99% RA Musculoskeletal:        General: Normal range of motion.  Skin:    General: Skin is warm and  dry.  Neurological:     General: No focal deficit present.     Mental Status: She is alert and oriented to person, place, and time. Mental status is at baseline.  Psychiatric:        Mood and Affect: Mood normal.        Behavior: Behavior normal.        Thought Content: Thought content normal.        Judgment: Judgment normal.     Lab Results:  CBC    Component Value Date/Time   WBC 6.4 06/04/2020 1449   RBC 4.96 06/04/2020 1449   HGB 14.1 06/04/2020 1449   HCT 44.3 06/04/2020 1449   PLT 244 06/04/2020 1449   MCV 89.3 06/04/2020 1449   MCH 28.4 06/04/2020 1449   MCHC 31.8 06/04/2020 1449   RDW 15.7 (H) 06/04/2020 1449   LYMPHSABS 1.4 12/10/2018 0706   MONOABS 0.4 12/10/2018 0706   EOSABS 0.0 12/10/2018 0706   BASOSABS 0.0 12/10/2018 0706    BMET    Component Value Date/Time   NA 140 06/04/2020 1449   K 4.4 06/04/2020 1449   CL 107 06/04/2020 1449   CO2 27 06/04/2020 1449   GLUCOSE 65 (L) 06/04/2020 1449   BUN 16 06/04/2020 1449   CREATININE 1.01 (H) 06/04/2020 1449   CALCIUM 9.2 06/04/2020 1449   GFRNONAA 60 (L) 06/04/2020 1449   GFRAA >60 12/10/2018 0706    BNP No results found for: BNP  ProBNP No results found for: PROBNP  Imaging: No results found.   Assessment & Plan:   Other secondary pulmonary hypertension ([place]) - Patient was noted to have dilated main PA on lung cancer screening CT. She is asymptomatic. Denies shortness of breath symptoms. VQ scan was negative for PE.  Echocardiogram in May 2022 showed that she had normal PA systolic pressure. Split night sleep study did not show  evidence of OSA, she did have transient episode of hypoxemia. She still needs full pulmonary function testing.   Nocturnal hypoxia - Patient was noted to have spent 12 mins with SpO2 <88% on recent sleep study. NO evidence of sleep apnea. She has been started on 1L nocturnal oxygen.   Smoker - Discussed smoking cessation > 3 mins, recommend she taper amount and pick quit date      Martyn Ehrich, NP 07/29/2020

## 2020-07-29 NOTE — Patient Instructions (Signed)
Echocardiogram showed normal heart function, no evidence of pulmonary hypertension  Sleep study showed no evidence of sleep apnea, you had transient episodes of hypoxia.   Recommendations: - Continue to wear 1L oxygen at night - Need to scheduled breathing test - Due for CT chest in December 2022 to monitor lung nodules  - Work on cutting back amount you smoke and pick a quit date   Follow-up: - Please schedule PFTs and follow-up with Dr. Lamonte Sakai first available

## 2020-07-29 NOTE — Addendum Note (Signed)
Addended by: Dessie Coma on: 07/29/2020 01:23 PM   Modules accepted: Orders

## 2020-07-29 NOTE — Assessment & Plan Note (Signed)
-   Discussed smoking cessation > 3 mins, recommend she taper amount and pick quit date

## 2020-07-29 NOTE — Assessment & Plan Note (Signed)
-   Patient was noted to have dilated main PA on lung cancer screening CT. She is asymptomatic. Denies shortness of breath symptoms. VQ scan was negative for PE.  Echocardiogram in May 2022 showed that she had normal PA systolic pressure. Split night sleep study did not show evidence of OSA, she did have transient episode of hypoxemia. She still needs full pulmonary function testing.

## 2020-07-29 NOTE — Assessment & Plan Note (Addendum)
-   Patient was noted to have spent 12 mins with SpO2 <88% on recent sleep study. NO evidence of sleep apnea. She has been started on 1L nocturnal oxygen.

## 2020-08-06 DIAGNOSIS — I10 Essential (primary) hypertension: Secondary | ICD-10-CM | POA: Diagnosis not present

## 2020-08-13 ENCOUNTER — Other Ambulatory Visit: Payer: Self-pay | Admitting: Physician Assistant

## 2020-08-13 DIAGNOSIS — M75112 Incomplete rotator cuff tear or rupture of left shoulder, not specified as traumatic: Secondary | ICD-10-CM

## 2020-08-19 ENCOUNTER — Other Ambulatory Visit: Payer: Self-pay

## 2020-08-19 ENCOUNTER — Ambulatory Visit
Admission: RE | Admit: 2020-08-19 | Discharge: 2020-08-19 | Disposition: A | Discharge status: Home / Self Care | Payer: Medicare HMO | Source: Ambulatory Visit | Attending: Physician Assistant | Admitting: Physician Assistant

## 2020-08-19 DIAGNOSIS — M75112 Incomplete rotator cuff tear or rupture of left shoulder, not specified as traumatic: Secondary | ICD-10-CM

## 2020-08-19 DIAGNOSIS — S46012A Strain of muscle(s) and tendon(s) of the rotator cuff of left shoulder, initial encounter: Secondary | ICD-10-CM | POA: Diagnosis not present

## 2020-08-27 DIAGNOSIS — I2729 Other secondary pulmonary hypertension: Secondary | ICD-10-CM | POA: Diagnosis not present

## 2020-08-27 DIAGNOSIS — I270 Primary pulmonary hypertension: Secondary | ICD-10-CM | POA: Diagnosis not present

## 2020-08-27 DIAGNOSIS — R0902 Hypoxemia: Secondary | ICD-10-CM | POA: Diagnosis not present

## 2020-09-01 DIAGNOSIS — B372 Candidiasis of skin and nail: Secondary | ICD-10-CM | POA: Diagnosis not present

## 2020-09-01 DIAGNOSIS — L304 Erythema intertrigo: Secondary | ICD-10-CM | POA: Diagnosis not present

## 2020-09-01 DIAGNOSIS — E7800 Pure hypercholesterolemia, unspecified: Secondary | ICD-10-CM | POA: Diagnosis not present

## 2020-09-01 DIAGNOSIS — T148XXA Other injury of unspecified body region, initial encounter: Secondary | ICD-10-CM | POA: Diagnosis not present

## 2020-09-04 DIAGNOSIS — I10 Essential (primary) hypertension: Secondary | ICD-10-CM | POA: Diagnosis not present

## 2020-09-18 DIAGNOSIS — M7541 Impingement syndrome of right shoulder: Secondary | ICD-10-CM | POA: Diagnosis not present

## 2020-09-18 DIAGNOSIS — M19019 Primary osteoarthritis, unspecified shoulder: Secondary | ICD-10-CM | POA: Diagnosis not present

## 2020-09-18 DIAGNOSIS — M7542 Impingement syndrome of left shoulder: Secondary | ICD-10-CM | POA: Diagnosis not present

## 2020-09-27 DIAGNOSIS — R0902 Hypoxemia: Secondary | ICD-10-CM | POA: Diagnosis not present

## 2020-09-27 DIAGNOSIS — I2729 Other secondary pulmonary hypertension: Secondary | ICD-10-CM | POA: Diagnosis not present

## 2020-09-27 DIAGNOSIS — I270 Primary pulmonary hypertension: Secondary | ICD-10-CM | POA: Diagnosis not present

## 2020-10-15 ENCOUNTER — Other Ambulatory Visit: Payer: Self-pay | Admitting: Family Medicine

## 2020-10-15 DIAGNOSIS — Z1231 Encounter for screening mammogram for malignant neoplasm of breast: Secondary | ICD-10-CM

## 2020-10-27 ENCOUNTER — Telehealth (HOSPITAL_COMMUNITY): Payer: Self-pay

## 2020-10-27 ENCOUNTER — Other Ambulatory Visit (HOSPITAL_COMMUNITY): Payer: Self-pay | Admitting: Interventional Radiology

## 2020-10-27 DIAGNOSIS — I771 Stricture of artery: Secondary | ICD-10-CM

## 2020-10-27 NOTE — Telephone Encounter (Signed)
Called to schedule angiogram, no answer, left vm. AW 

## 2020-10-28 DIAGNOSIS — R0902 Hypoxemia: Secondary | ICD-10-CM | POA: Diagnosis not present

## 2020-10-28 DIAGNOSIS — I2729 Other secondary pulmonary hypertension: Secondary | ICD-10-CM | POA: Diagnosis not present

## 2020-10-28 DIAGNOSIS — I270 Primary pulmonary hypertension: Secondary | ICD-10-CM | POA: Diagnosis not present

## 2020-10-29 DIAGNOSIS — I10 Essential (primary) hypertension: Secondary | ICD-10-CM | POA: Diagnosis not present

## 2020-10-29 DIAGNOSIS — E7800 Pure hypercholesterolemia, unspecified: Secondary | ICD-10-CM | POA: Diagnosis not present

## 2020-10-29 DIAGNOSIS — E118 Type 2 diabetes mellitus with unspecified complications: Secondary | ICD-10-CM | POA: Diagnosis not present

## 2020-10-29 DIAGNOSIS — E119 Type 2 diabetes mellitus without complications: Secondary | ICD-10-CM | POA: Diagnosis not present

## 2020-10-29 DIAGNOSIS — N183 Chronic kidney disease, stage 3 unspecified: Secondary | ICD-10-CM | POA: Diagnosis not present

## 2020-10-29 DIAGNOSIS — K219 Gastro-esophageal reflux disease without esophagitis: Secondary | ICD-10-CM | POA: Diagnosis not present

## 2020-10-29 DIAGNOSIS — E039 Hypothyroidism, unspecified: Secondary | ICD-10-CM | POA: Diagnosis not present

## 2020-11-19 ENCOUNTER — Ambulatory Visit
Admission: RE | Admit: 2020-11-19 | Discharge: 2020-11-19 | Disposition: A | Discharge status: Home / Self Care | Payer: Medicare Other | Source: Ambulatory Visit | Attending: Family Medicine | Admitting: Family Medicine

## 2020-11-19 ENCOUNTER — Other Ambulatory Visit: Payer: Self-pay

## 2020-11-19 DIAGNOSIS — Z1231 Encounter for screening mammogram for malignant neoplasm of breast: Secondary | ICD-10-CM

## 2020-11-26 DIAGNOSIS — E559 Vitamin D deficiency, unspecified: Secondary | ICD-10-CM | POA: Diagnosis not present

## 2020-11-26 DIAGNOSIS — E039 Hypothyroidism, unspecified: Secondary | ICD-10-CM | POA: Diagnosis not present

## 2020-11-26 DIAGNOSIS — Z122 Encounter for screening for malignant neoplasm of respiratory organs: Secondary | ICD-10-CM | POA: Diagnosis not present

## 2020-11-26 DIAGNOSIS — E118 Type 2 diabetes mellitus with unspecified complications: Secondary | ICD-10-CM | POA: Diagnosis not present

## 2020-11-26 DIAGNOSIS — K2270 Barrett's esophagus without dysplasia: Secondary | ICD-10-CM | POA: Diagnosis not present

## 2020-11-26 DIAGNOSIS — Z23 Encounter for immunization: Secondary | ICD-10-CM | POA: Diagnosis not present

## 2020-11-26 DIAGNOSIS — Z9889 Other specified postprocedural states: Secondary | ICD-10-CM | POA: Diagnosis not present

## 2020-11-26 DIAGNOSIS — I10 Essential (primary) hypertension: Secondary | ICD-10-CM | POA: Diagnosis not present

## 2020-11-26 DIAGNOSIS — G4734 Idiopathic sleep related nonobstructive alveolar hypoventilation: Secondary | ICD-10-CM | POA: Diagnosis not present

## 2020-11-26 DIAGNOSIS — E7800 Pure hypercholesterolemia, unspecified: Secondary | ICD-10-CM | POA: Diagnosis not present

## 2020-11-26 DIAGNOSIS — Z0000 Encounter for general adult medical examination without abnormal findings: Secondary | ICD-10-CM | POA: Diagnosis not present

## 2020-11-26 DIAGNOSIS — N183 Chronic kidney disease, stage 3 unspecified: Secondary | ICD-10-CM | POA: Diagnosis not present

## 2020-11-27 DIAGNOSIS — I270 Primary pulmonary hypertension: Secondary | ICD-10-CM | POA: Diagnosis not present

## 2020-11-27 DIAGNOSIS — I2729 Other secondary pulmonary hypertension: Secondary | ICD-10-CM | POA: Diagnosis not present

## 2020-11-27 DIAGNOSIS — R0902 Hypoxemia: Secondary | ICD-10-CM | POA: Diagnosis not present

## 2020-12-01 ENCOUNTER — Other Ambulatory Visit (HOSPITAL_COMMUNITY): Payer: Self-pay | Admitting: Physician Assistant

## 2020-12-02 ENCOUNTER — Other Ambulatory Visit: Payer: Self-pay | Admitting: Radiology

## 2020-12-03 ENCOUNTER — Other Ambulatory Visit: Payer: Self-pay

## 2020-12-03 ENCOUNTER — Ambulatory Visit (HOSPITAL_COMMUNITY)
Admission: RE | Admit: 2020-12-03 | Discharge: 2020-12-03 | Disposition: A | Discharge status: Home / Self Care | Payer: Medicare Other | Source: Ambulatory Visit | Attending: Interventional Radiology | Admitting: Interventional Radiology

## 2020-12-03 ENCOUNTER — Other Ambulatory Visit (HOSPITAL_COMMUNITY): Payer: Self-pay | Admitting: Interventional Radiology

## 2020-12-03 DIAGNOSIS — Z881 Allergy status to other antibiotic agents status: Secondary | ICD-10-CM | POA: Insufficient documentation

## 2020-12-03 DIAGNOSIS — I10 Essential (primary) hypertension: Secondary | ICD-10-CM | POA: Diagnosis not present

## 2020-12-03 DIAGNOSIS — I6521 Occlusion and stenosis of right carotid artery: Secondary | ICD-10-CM | POA: Insufficient documentation

## 2020-12-03 DIAGNOSIS — I6502 Occlusion and stenosis of left vertebral artery: Secondary | ICD-10-CM | POA: Insufficient documentation

## 2020-12-03 DIAGNOSIS — F17210 Nicotine dependence, cigarettes, uncomplicated: Secondary | ICD-10-CM | POA: Diagnosis not present

## 2020-12-03 DIAGNOSIS — Z885 Allergy status to narcotic agent status: Secondary | ICD-10-CM | POA: Insufficient documentation

## 2020-12-03 DIAGNOSIS — Z79890 Hormone replacement therapy (postmenopausal): Secondary | ICD-10-CM | POA: Diagnosis not present

## 2020-12-03 DIAGNOSIS — Z86718 Personal history of other venous thrombosis and embolism: Secondary | ICD-10-CM | POA: Diagnosis not present

## 2020-12-03 DIAGNOSIS — I771 Stricture of artery: Secondary | ICD-10-CM

## 2020-12-03 DIAGNOSIS — Z7982 Long term (current) use of aspirin: Secondary | ICD-10-CM | POA: Diagnosis not present

## 2020-12-03 DIAGNOSIS — Z888 Allergy status to other drugs, medicaments and biological substances status: Secondary | ICD-10-CM | POA: Diagnosis not present

## 2020-12-03 DIAGNOSIS — Z79899 Other long term (current) drug therapy: Secondary | ICD-10-CM | POA: Diagnosis not present

## 2020-12-03 DIAGNOSIS — Z7902 Long term (current) use of antithrombotics/antiplatelets: Secondary | ICD-10-CM | POA: Diagnosis not present

## 2020-12-03 HISTORY — PX: IR ANGIO VERTEBRAL SEL VERTEBRAL BILAT MOD SED: IMG5369

## 2020-12-03 HISTORY — PX: IR US GUIDE VASC ACCESS RIGHT: IMG2390

## 2020-12-03 HISTORY — PX: IR ANGIO INTRA EXTRACRAN SEL COM CAROTID INNOMINATE BILAT MOD SED: IMG5360

## 2020-12-03 LAB — CBC
HCT: 46.2 % — ABNORMAL HIGH (ref 36.0–46.0)
Hemoglobin: 15.2 g/dL — ABNORMAL HIGH (ref 12.0–15.0)
MCH: 28.5 pg (ref 26.0–34.0)
MCHC: 32.9 g/dL (ref 30.0–36.0)
MCV: 86.5 fL (ref 80.0–100.0)
Platelets: 307 10*3/uL (ref 150–400)
RBC: 5.34 MIL/uL — ABNORMAL HIGH (ref 3.87–5.11)
RDW: 15.7 % — ABNORMAL HIGH (ref 11.5–15.5)
WBC: 5.6 10*3/uL (ref 4.0–10.5)
nRBC: 0 % (ref 0.0–0.2)

## 2020-12-03 LAB — POCT I-STAT, CHEM 8
BUN: 12 mg/dL (ref 8–23)
Calcium, Ion: 1.24 mmol/L (ref 1.15–1.40)
Chloride: 107 mmol/L (ref 98–111)
Creatinine, Ser: 0.9 mg/dL (ref 0.44–1.00)
Glucose, Bld: 72 mg/dL (ref 70–99)
HCT: 48 % — ABNORMAL HIGH (ref 36.0–46.0)
Hemoglobin: 16.3 g/dL — ABNORMAL HIGH (ref 12.0–15.0)
Potassium: 3.9 mmol/L (ref 3.5–5.1)
Sodium: 144 mmol/L (ref 135–145)
TCO2: 25 mmol/L (ref 22–32)

## 2020-12-03 LAB — PROTIME-INR

## 2020-12-03 MED ORDER — VERAPAMIL HCL 2.5 MG/ML IV SOLN: INTRA_ARTERIAL | Status: AC | PRN

## 2020-12-03 MED ORDER — SODIUM CHLORIDE 0.9 % IV SOLN: INTRAVENOUS | Status: AC

## 2020-12-03 MED ORDER — HEPARIN SODIUM (PORCINE) 1000 UNIT/ML IJ SOLN
INTRAMUSCULAR | Status: AC
  Filled 2020-12-03: qty 1

## 2020-12-03 MED ORDER — VERAPAMIL HCL 2.5 MG/ML IV SOLN
INTRAVENOUS | Status: AC
  Filled 2020-12-03: qty 2

## 2020-12-03 MED ORDER — NITROGLYCERIN 1 MG/10 ML FOR IR/CATH LAB
INTRA_ARTERIAL | Status: AC
  Filled 2020-12-03: qty 10

## 2020-12-03 MED ORDER — IOHEXOL 300 MG/ML  SOLN
100.0000 mL | Freq: Once | INTRAMUSCULAR | Status: AC | PRN
  Administered 2020-12-03: 65 mL via INTRA_ARTERIAL

## 2020-12-03 MED ORDER — FENTANYL CITRATE (PF) 100 MCG/2ML IJ SOLN
INTRAMUSCULAR | Status: AC
  Filled 2020-12-03: qty 2

## 2020-12-03 MED ORDER — FENTANYL CITRATE (PF) 100 MCG/2ML IJ SOLN
INTRAMUSCULAR | Status: AC | PRN
  Administered 2020-12-03: 25 ug via INTRAVENOUS

## 2020-12-03 MED ORDER — HEPARIN SODIUM (PORCINE) 1000 UNIT/ML IJ SOLN
INTRAMUSCULAR | Status: AC | PRN
  Administered 2020-12-03: 2000 [IU] via INTRAVENOUS

## 2020-12-03 MED ORDER — SODIUM CHLORIDE 0.9 % IV SOLN: INTRAVENOUS | Status: DC

## 2020-12-03 MED ORDER — MIDAZOLAM HCL 2 MG/2ML IJ SOLN
INTRAMUSCULAR | Status: AC | PRN
  Administered 2020-12-03: 1 mg via INTRAVENOUS

## 2020-12-03 MED ORDER — LIDOCAINE HCL 1 % IJ SOLN
INTRAMUSCULAR | Status: AC
  Filled 2020-12-03: qty 20

## 2020-12-03 MED ORDER — MIDAZOLAM HCL 2 MG/2ML IJ SOLN
INTRAMUSCULAR | Status: AC
  Filled 2020-12-03: qty 2

## 2020-12-03 MED ORDER — HYDRALAZINE HCL 20 MG/ML IJ SOLN
INTRAMUSCULAR | Status: AC
  Filled 2020-12-03: qty 1

## 2020-12-03 MED ORDER — NITROGLYCERIN 1 MG/10 ML FOR IR/CATH LAB
INTRA_ARTERIAL | Status: AC | PRN
  Administered 2020-12-03: 200 ug via INTRA_ARTERIAL

## 2020-12-03 NOTE — Sedation Documentation (Signed)
Patient transported to short stay. Right wrist assessed. Clean, dry and intact. No drainage noted. No hematoma. +2 radial pulse intact.

## 2020-12-03 NOTE — Procedures (Signed)
S/P 4 vessel cerebral arteriograms. RT Rad approach . Findings. 1.Approx 50 to 60 % stenosis  LT VBJ  2.Approx 75 % stenosis LT VA origin. 3.Approx 65 to 70 % stenosis RT ICA cav seg .  S.Effie Wahlert MD

## 2020-12-03 NOTE — H&P (Signed)
Chief Complaint: Patient was seen in consultation today for diagnostic cerebral angiogram.   Referring Physician(s): Deveshwar,Sanjeev  Supervising Physician: Luanne Bras  Patient Status: [place]  History of Present Illness: Vicki Page is a 70 y.o. female with a medical history significant for HTN, DVT, carotid artery occlusion and brain aneurysms. She has been followed by [facility]/Dr. 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 She has been followed closely over the past few years for multiple areas of intracranial stenosis.   She last underwent cerebral angiography with intent to treat vertebrobasilar insufficiency 12/10/2018, however the exam revealed improved stenosis and no intervention was performed.   She presents today for a diagnostic cerebral angiogram for routine follow up/evaluation of vertebral artery stenosis. She takes aspirin and plavix daily.   Past Medical History:  Diagnosis Date   Adenomatous colon polyp    Aneurysm ([place])    brain x 2   Anxiety    Arthritis    Atherosclerosis    Barrett's esophagus    Carotid artery occlusion    Cataracts, bilateral    just watching   DDD (degenerative disc disease), lumbar    Depression    Diverticulosis    DVT (deep venous thrombosis) ([facility])    left leg   Fall at home May 30, 2014   Pt slipped in tub- hurt left hip and right shoulder   GERD (gastroesophageal reflux disease)    H. pylori infection    Hyperlipidemia    Hypertension    Hypothyroidism    IBS (irritable bowel syndrome)    Osteoarthritis    Poor sleep 04/28/2020   Pre-diabetes    no meds, diet controlled   RLS (restless legs syndrome)    Tubular adenoma of colon    Ulcer of the stomach and intestine     Past Surgical History:  Procedure Laterality Date   ANAL RECTAL MANOMETRY N/A 05/04/2016   Procedure: ANO RECTAL  MANOMETRY;  Surgeon: Mauri Pole, MD;  Location: [facility];  Service: Endoscopy;  Laterality: N/A;   ANEURYSM COILING  July 26, 2010   stent   BRAIN SURGERY     Per patient " Left side behind eyes   INSERTION OF ILIAC STENT  10/04/2012   Procedure: INSERTION OF ILIAC STENT;  Surgeon: Jettie Booze, MD;  Location: [facility] CATH LAB;  Service: Cardiovascular;;  Left External Iliac Artery   IR ANGIO INTRA EXTRACRAN SEL COM CAROTID INNOMINATE BILAT MOD SED  11/23/2018   IR ANGIO VERTEBRAL SEL SUBCLAVIAN INNOMINATE UNI L MOD SED  11/23/2018   IR ANGIO VERTEBRAL SEL VERTEBRAL UNI L MOD SED  12/10/2018   IR ANGIO VERTEBRAL SEL VERTEBRAL UNI R MOD SED  11/23/2018   IR GENERIC HISTORICAL  11/03/2015   IR ANGIO VERTEBRAL SEL VERTEBRAL UNI L MOD SED 11/03/2015 Luanne Bras, MD [facility]   IR GENERIC HISTORICAL  11/03/2015   IR ANGIO INTRA EXTRACRAN SEL COM CAROTID INNOMINATE BILAT MOD SED 11/03/2015 Luanne Bras, MD [facility]   IR GENERIC HISTORICAL  11/03/2015   IR ANGIO VERTEBRAL SEL SUBCLAVIAN INNOMINATE UNI R MOD SED 11/03/2015 Luanne Bras, MD [facility]   IR GENERIC HISTORICAL  11/19/2015   IR RADIOLOGIST EVAL & MGMT 11/19/2015 [facility]   IR US GUIDE VASC ACCESS RIGHT  11/23/2018   LEG SURGERY     LOWER EXTREMITY ANGIOGRAM Left 10-04-12   and Left  iliac stent   LOWER EXTREMITY ANGIOGRAM N/A 10/04/2012   Procedure: LOWER EXTREMITY ANGIOGRAM;  Surgeon: Jettie Booze, MD;  Location: [facility] CATH LAB;  Service: Cardiovascular;  Laterality: N/A;   RADIOLOGY WITH ANESTHESIA N/A 12/10/2018   Procedure: STENTING;  Surgeon: Luanne Bras, MD;  Location: [place];  Service: Radiology;  Laterality: N/A;   TUBAL LIGATION      Allergies: Fluconazole, Ropinirole, Codeine, Darvon [propoxyphene hcl], and Flagyl [metronidazole]  Medications: Prior to Admission medications   Medication Sig Start Date End Date Taking? Authorizing Provider  acetaminophen (TYLENOL) 500 MG  tablet Take 1,000 mg by mouth 2 (two) times daily as needed for pain.    Yes [provider]  amLODipine (NORVASC) 10 MG tablet Take 10 mg by mouth daily. for high blood pressure 10/08/18  Yes [provider]  aspirin EC 81 MG tablet Take 81 mg by mouth daily.   Yes [provider]  atorvastatin (LIPITOR) 80 MG tablet Take 80 mg by mouth daily.   Yes [provider]  buPROPion (WELLBUTRIN SR) 150 MG 12 hr tablet Take 150 mg by mouth 2 (two) times daily.   Yes [provider]  Calcium-Vitamin D (CALTRATE 600 PLUS-VIT D PO) Take 1 tablet by mouth daily before supper.    Yes [provider]  Cholecalciferol (VITAMIN D3) 10 MCG (400 UNIT) CAPS Take 400 Units by mouth daily.   Yes [provider]  clopidogrel (PLAVIX) 75 MG tablet Take 1 tablet (75 mg total) by mouth daily with breakfast. 10/05/12  Yes Jettie Booze, MD  diclofenac Sodium (VOLTAREN) 1 % GEL Apply 1 application topically 4 (four) times daily as needed (pain).   Yes [provider]  ferrous sulfate 325 (65 FE) MG tablet Take 325 mg by mouth daily.   Yes [provider]  levothyroxine (SYNTHROID, LEVOTHROID) 25 MCG tablet Take 25 mcg by mouth daily before breakfast.    Yes [provider]  losartan (COZAAR) 50 MG tablet Take 75 mg by mouth daily.   Yes [provider]  Magnesium 200 MG TABS Take 200 mg by mouth daily.   Yes [provider]  Multiple Vitamin (MULTIVITAMIN WITH MINERALS) TABS tablet Take 1 tablet by mouth daily before supper. Centrum Silver   Yes [provider]  nystatin (MYCOSTATIN/NYSTOP) powder Apply 1 application topically 2 (two) times daily as needed (rash). 04/20/20  Yes [provider]  pantoprazole (PROTONIX) 40 MG tablet Take 1 tablet (40 mg total) by mouth daily. 04/27/20  Yes Pyrtle, Lajuan Lines, MD  Probiotic Product (PROBIOTIC PO) Take 1 tablet by mouth daily after lunch.    Yes [provider]  Psyllium (METAMUCIL) 28.3 % POWD Take 1 Dose by mouth daily. 2 teaspoons full = 1 dose   Yes [provider]  Tetrahydrozoline HCl (VISINE OP) Place 1 drop into both eyes daily as needed (irritation/dry eyes).   Yes [provider]  vitamin B-12 (CYANOCOBALAMIN) 1000 MCG tablet Take 1,000 mcg by mouth daily.   Yes [provider]  vitamin C (ASCORBIC ACID) 500 MG tablet Take 500 mg by mouth daily.   Yes [provider]     Family History  Problem Relation Age of Onset   Deep vein thrombosis Mother    Diabetes Mother        Amputation   Heart disease Mother        Heart Disease before age 63   Hyperlipidemia Mother  Hypertension Mother    Heart attack Mother    Stroke Mother    Thyroid disease Mother    Heart disease Father        Heart Disease before age 62   Hypertension Father    Heart attack Father    Hyperlipidemia Father    Heart disease Brother        Heart Disease before age 88   Hyperlipidemia Brother    Hypertension Brother    Heart attack Brother    COPD Brother    Diabetes Brother    Colonic polyp Brother    Colon cancer Neg Hx    Stomach cancer Neg Hx     Social History   Socioeconomic History   Marital status: Single    Spouse name: Not on file   Number of children: 2   Years of education: 16   Highest education level: Not on file  Occupational History   Occupation: Retired  Tobacco Use   Smoking status: Every Day    Packs/day: 0.25    Types: Cigarettes   Smokeless tobacco: Never   Tobacco comments:    5 cigarettes smoked daily ARJ 03/11/20  Vaping Use   Vaping Use: Former  Substance and Sexual Activity   Alcohol use: No    Alcohol/week: 0.0 standard drinks   Drug use: No   Sexual activity: Never    Birth control/protection: Post-menopausal  Other Topics Concern   Not on file  Social History Narrative   Lives alone   Caffeine use: none   Right handed   Social Determinants of Health    Financial Resource Strain: Not on file  Food Insecurity: Not on file  Transportation Needs: Not on file  Physical Activity: Not on file  Stress: Not on file  Social Connections: Not on file    Review of Systems: A 12 point ROS discussed and pertinent positives are indicated in the HPI above.  All other systems are negative.  Review of Systems  Constitutional:  Negative for appetite change and fatigue.  Respiratory:  Negative for cough and shortness of breath.   Cardiovascular:  Negative for chest pain and leg swelling.  Gastrointestinal:  Positive for constipation. Negative for abdominal pain, diarrhea, nausea and vomiting.  Neurological:  Positive for dizziness. Negative for headaches.   Vital Signs: BP (!) 156/75   Pulse 85   Temp 98.3 F (36.8 C) (Oral)   Resp 12   Ht 5' 1.5" (1.562 m)   Wt 139 lb 9.6 oz (63.3 kg)   LMP  (LMP Unknown)   SpO2 96%   BMI 25.95 kg/m   Physical Exam Constitutional:      General: She is not in acute distress. HENT:     Mouth/Throat:     Mouth: Mucous membranes are moist.     Pharynx: Oropharynx is clear.  Cardiovascular:     Rate and Rhythm: Normal rate and regular rhythm.     Pulses: Normal pulses.     Heart sounds: Normal heart sounds.  Pulmonary:     Effort: Pulmonary effort is normal.     Breath sounds: Normal breath sounds.  Abdominal:     General: Bowel sounds are normal.     Palpations: Abdomen is soft.     Tenderness: There is no abdominal tenderness.  Skin:    General: Skin is warm and dry.  Neurological:     Mental Status: She is alert and oriented to person, place, and time.  Imaging: MM 3D SCREEN BREAST BILATERAL  Result Date: 11/24/2020 CLINICAL DATA:  Screening. EXAM: DIGITAL SCREENING BILATERAL MAMMOGRAM WITH TOMOSYNTHESIS AND CAD TECHNIQUE: Bilateral screening digital craniocaudal and mediolateral oblique mammograms were obtained. Bilateral screening digital breast tomosynthesis was performed. The images  were evaluated with computer-aided detection. COMPARISON:  Previous exam(s). ACR Breast Density Category b: There are scattered areas of fibroglandular density. FINDINGS: There are no findings suspicious for malignancy. IMPRESSION: No mammographic evidence of malignancy. A result letter of this screening mammogram will be mailed directly to the patient. RECOMMENDATION: Screening mammogram in one year. (Code:SM-B-01Y) BI-RADS CATEGORY  1: Negative. Electronically Signed   By: Abelardo Diesel M.D.   On: 11/24/2020 15:45    Labs:  CBC: Recent Labs    06/04/20 1449 12/03/20 1107 12/03/20 1309  WBC 6.4 5.6  --   HGB 14.1 15.2* 16.3*  HCT 44.3 46.2* 48.0*  PLT 244 307  --     COAGS: No results for input(s): INR, APTT in the last 8760 hours.  BMP: Recent Labs    03/12/20 1447 06/04/20 1449 12/03/20 1309  NA  --  140 144  K  --  4.4 3.9  CL  --  107 107  CO2  --  27  --   GLUCOSE  --  65* 72  BUN  --  16 12  CALCIUM  --  9.2  --   CREATININE 1.10* 1.01* 0.90  GFRNONAA  --  60*  --     LIVER FUNCTION TESTS: No results for input(s): BILITOT, AST, ALT, ALKPHOS, PROT, ALBUMIN in the last 8760 hours.  TUMOR MARKERS: No results for input(s): AFPTM, CEA, CA199, CHROMGRNA in the last 8760 hours.  Assessment and Plan:  History of intracranial stenosis; history of brain aneurysms: Vicki Page, 70 year old female, presents today to the [facility] department for an image-guided diagnostic cerebral angiogram.   Risks and benefits of this procedure were discussed with the patient including, but not limited to bleeding, infection, vascular injury or contrast induced renal failure.  This interventional procedure involves the use of X-rays and because of the nature of the planned procedure, it is possible that we will have prolonged use of X-ray fluoroscopy.  Potential radiation risks to you include (but are not limited to) the following: - A slightly elevated  risk for cancer  several years later in life. This risk is typically less than 0.5% percent. This risk is low in comparison to the normal incidence of human cancer, which is 33% for women and 50% for men according to the [place]. - Radiation induced injury can include skin redness, resembling a rash, tissue breakdown / ulcers and hair loss (which can be temporary or permanent).   The likelihood of either of these occurring depends on the difficulty of the procedure and whether you are sensitive to radiation due to previous procedures, disease, or genetic conditions.   IF your procedure requires a prolonged use of radiation, you will be notified and given written instructions for further action.  It is your responsibility to monitor the irradiated area for the 2 weeks following the procedure and to notify your physician if you are concerned that you have suffered a radiation induced injury.    All of the patient's questions were answered, patient is agreeable to proceed.  Consent signed and in chart.  Thank you for this interesting consult.  I greatly enjoyed meeting Vicki Page and look forward to participating in their  care.  A copy of this report was sent to the requesting provider on this date.  Electronically Signed: Soyla Dryer, AGACNP-BC [phone] 12/03/2020, 1:22 PM   I spent a total of  30 Minutes   in face to face in clinical consultation, greater than 50% of which was counseling/coordinating care for diagnostic cerebral angiogram.

## 2020-12-07 ENCOUNTER — Telehealth (HOSPITAL_COMMUNITY): Payer: Self-pay

## 2020-12-07 NOTE — Telephone Encounter (Signed)
Returned pt's call, no answer, left vm. AW  

## 2020-12-28 DIAGNOSIS — I270 Primary pulmonary hypertension: Secondary | ICD-10-CM | POA: Diagnosis not present

## 2020-12-28 DIAGNOSIS — I2729 Other secondary pulmonary hypertension: Secondary | ICD-10-CM | POA: Diagnosis not present

## 2020-12-28 DIAGNOSIS — R0902 Hypoxemia: Secondary | ICD-10-CM | POA: Diagnosis not present

## 2021-01-12 ENCOUNTER — Other Ambulatory Visit: Payer: Self-pay | Admitting: *Deleted

## 2021-01-12 DIAGNOSIS — F17210 Nicotine dependence, cigarettes, uncomplicated: Secondary | ICD-10-CM

## 2021-01-12 DIAGNOSIS — Z87891 Personal history of nicotine dependence: Secondary | ICD-10-CM

## 2021-01-14 ENCOUNTER — Inpatient Hospital Stay: Admission: RE | Admit: 2021-01-14 | Payer: Medicare Other | Source: Ambulatory Visit

## 2021-01-21 ENCOUNTER — Encounter: Payer: Self-pay | Admitting: Internal Medicine

## 2021-01-25 DIAGNOSIS — K136 Irritative hyperplasia of oral mucosa: Secondary | ICD-10-CM | POA: Diagnosis not present

## 2021-01-25 DIAGNOSIS — J3489 Other specified disorders of nose and nasal sinuses: Secondary | ICD-10-CM | POA: Insufficient documentation

## 2021-01-27 DIAGNOSIS — R0902 Hypoxemia: Secondary | ICD-10-CM | POA: Diagnosis not present

## 2021-01-27 DIAGNOSIS — I270 Primary pulmonary hypertension: Secondary | ICD-10-CM | POA: Diagnosis not present

## 2021-01-27 DIAGNOSIS — I2729 Other secondary pulmonary hypertension: Secondary | ICD-10-CM | POA: Diagnosis not present

## 2021-03-11 ENCOUNTER — Other Ambulatory Visit: Payer: Self-pay

## 2021-03-11 ENCOUNTER — Ambulatory Visit (INDEPENDENT_AMBULATORY_CARE_PROVIDER_SITE_OTHER)
Admission: RE | Admit: 2021-03-11 | Discharge: 2021-03-11 | Disposition: A | Discharge status: Home / Self Care | Payer: Medicare PPO | Source: Ambulatory Visit | Attending: Acute Care | Admitting: Acute Care

## 2021-03-11 DIAGNOSIS — Z87891 Personal history of nicotine dependence: Secondary | ICD-10-CM | POA: Diagnosis not present

## 2021-03-11 DIAGNOSIS — J439 Emphysema, unspecified: Secondary | ICD-10-CM

## 2021-03-11 DIAGNOSIS — I700 Atherosclerosis of aorta: Secondary | ICD-10-CM | POA: Diagnosis not present

## 2021-03-11 DIAGNOSIS — F17210 Nicotine dependence, cigarettes, uncomplicated: Secondary | ICD-10-CM | POA: Diagnosis not present

## 2021-03-30 DIAGNOSIS — K136 Irritative hyperplasia of oral mucosa: Secondary | ICD-10-CM | POA: Diagnosis not present

## 2021-04-01 ENCOUNTER — Telehealth: Payer: Self-pay | Admitting: Acute Care

## 2021-04-01 DIAGNOSIS — Z87891 Personal history of nicotine dependence: Secondary | ICD-10-CM

## 2021-04-01 DIAGNOSIS — R911 Solitary pulmonary nodule: Secondary | ICD-10-CM

## 2021-04-01 DIAGNOSIS — F17210 Nicotine dependence, cigarettes, uncomplicated: Secondary | ICD-10-CM

## 2021-04-01 NOTE — Telephone Encounter (Signed)
Left message for pt to call back regarding lung screening CT results.  

## 2021-04-06 NOTE — Addendum Note (Signed)
Addended by: Joella Prince on: 04/06/2021 11:16 AM   Modules accepted: Orders

## 2021-04-06 NOTE — Telephone Encounter (Signed)
Spoke with patient by phone to review results of LDCT.  New lung nodule noted which is read as 'probably benign' but it is recommended to have a CT follow up in 6 months, rather than wait the 12 months.  Follow up is to look at the nodule again in 6 months to note any changes or growth which is recommended as a precaution.  Patient is agreeable to have the LCS nodule follow up scan in 6 months.  Order placed and will contact patient closer to time to schedule. Patient acknowledged understanding and had no further questions.

## 2021-05-27 DIAGNOSIS — E118 Type 2 diabetes mellitus with unspecified complications: Secondary | ICD-10-CM | POA: Diagnosis not present

## 2021-05-27 DIAGNOSIS — E7800 Pure hypercholesterolemia, unspecified: Secondary | ICD-10-CM | POA: Diagnosis not present

## 2021-05-27 DIAGNOSIS — I10 Essential (primary) hypertension: Secondary | ICD-10-CM | POA: Diagnosis not present

## 2021-05-27 DIAGNOSIS — E039 Hypothyroidism, unspecified: Secondary | ICD-10-CM | POA: Diagnosis not present

## 2021-05-27 DIAGNOSIS — Z23 Encounter for immunization: Secondary | ICD-10-CM | POA: Diagnosis not present

## 2021-05-28 DIAGNOSIS — I2729 Other secondary pulmonary hypertension: Secondary | ICD-10-CM | POA: Diagnosis not present

## 2021-05-28 DIAGNOSIS — I270 Primary pulmonary hypertension: Secondary | ICD-10-CM | POA: Diagnosis not present

## 2021-05-28 DIAGNOSIS — R0902 Hypoxemia: Secondary | ICD-10-CM | POA: Diagnosis not present

## 2021-06-27 DIAGNOSIS — I2729 Other secondary pulmonary hypertension: Secondary | ICD-10-CM | POA: Diagnosis not present

## 2021-06-27 DIAGNOSIS — I270 Primary pulmonary hypertension: Secondary | ICD-10-CM | POA: Diagnosis not present

## 2021-06-27 DIAGNOSIS — R0902 Hypoxemia: Secondary | ICD-10-CM | POA: Diagnosis not present

## 2021-06-30 ENCOUNTER — Ambulatory Visit (HOSPITAL_COMMUNITY)
Admission: EM | Admit: 2021-06-30 | Discharge: 2021-06-30 | Disposition: A | Discharge status: Home / Self Care | Payer: Medicare Other | Attending: Physician Assistant | Admitting: Physician Assistant

## 2021-06-30 ENCOUNTER — Encounter (HOSPITAL_COMMUNITY): Payer: Self-pay | Admitting: Emergency Medicine

## 2021-06-30 ENCOUNTER — Ambulatory Visit (INDEPENDENT_AMBULATORY_CARE_PROVIDER_SITE_OTHER): Payer: Medicare Other

## 2021-06-30 DIAGNOSIS — W19XXXA Unspecified fall, initial encounter: Secondary | ICD-10-CM | POA: Diagnosis not present

## 2021-06-30 DIAGNOSIS — M25561 Pain in right knee: Secondary | ICD-10-CM

## 2021-06-30 DIAGNOSIS — M1711 Unilateral primary osteoarthritis, right knee: Secondary | ICD-10-CM | POA: Diagnosis not present

## 2021-06-30 DIAGNOSIS — M7989 Other specified soft tissue disorders: Secondary | ICD-10-CM | POA: Diagnosis not present

## 2021-06-30 MED ORDER — HYDROCODONE-ACETAMINOPHEN 5-325 MG PO TABS: 1.0000 | ORAL_TABLET | Freq: Every evening | ORAL | 0 refills | Status: AC | PRN

## 2021-06-30 NOTE — Discharge Instructions (Signed)
Your x-ray did not show any kind of fracture which is great news.  Use the Ace bandage to provide support and compression.  Keep your knee elevated and use ice.  Take hydrocodone at night.  As we discussed, this can be very sedating so do not drive or drink alcohol with taking it.  You can start by cutting the pill in half.  Take Tylenol during the day for pain relief.  If your symptoms or not improving please follow-up with orthopedics as soon as possible; call to schedule an appointment.  If anything worsens return for reevaluation.

## 2021-06-30 NOTE — ED Triage Notes (Signed)
Pt reports was at [facility] 2 days ago and slipped on some spilt detergent in the floor causing her to fall. Pt landed on right knee then onto left side. Denies hitting head or LOC.

## 2021-06-30 NOTE — ED Provider Notes (Signed)
[place]    CSN: [id number] Arrival date & time: 06/30/21  1610      History   Chief Complaint Chief Complaint  Patient presents with   Knee Injury   Fall    HPI Vicki Page is a 71 y.o. female.   Patient presents today with a several day history of right knee pain.  Reports that she slipped on some spilled detergent at a dollar store following with the majority of her weight on her medial right knee.  She did not hit her head and denies any loss of consciousness, headache, dizziness, nausea, vomiting, amnesia surrounding event, visual change.  She reports that at rest pain is rated 6/7 on a 0-10 pain scale but increases to 8/9 with attempted ambulation, described as aching with periodic sharp pains, worse with walking, no alleviating factors identified.  She has taken Tylenol without improvement of symptoms.  Denies previous injury or surgery involving knee.  She is able to bear weight though it is painful.  Denies any popping, clicking, instability.  Denies any weakness, numbness, paresthesias.   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   Bad taste in mouth 04/26/2019   Brown tongue 04/26/2019   Nausea without vomiting 04/26/2019   Constipation    Brain aneurysm    Carotid stenosis    Cephalalgia    Smoker 01/03/2014   Occlusion and stenosis of carotid artery without mention of cerebral infarction 01/26/2012   Aneurysm ([place])    Hyperthyroidism    Hyperlipidemia     Past Surgical History:  Procedure Laterality Date   ANAL RECTAL MANOMETRY N/A 05/04/2016   Procedure: ANO RECTAL MANOMETRY;  Surgeon: Mauri Pole, MD;  Location: [facility];  Service: Endoscopy;  Laterality: N/A;   ANEURYSM COILING  July 26, 2010   stent   BRAIN SURGERY     Per patient " Left side behind eyes   INSERTION OF ILIAC STENT  10/04/2012   Procedure: INSERTION OF ILIAC STENT;  Surgeon: Jettie Booze, MD;  Location: [facility] CATH LAB;  Service: Cardiovascular;;  Left External Iliac Artery   IR ANGIO INTRA EXTRACRAN SEL COM CAROTID INNOMINATE BILAT MOD SED  11/23/2018   IR ANGIO INTRA EXTRACRAN SEL COM CAROTID INNOMINATE BILAT MOD SED  12/03/2020   IR ANGIO VERTEBRAL SEL SUBCLAVIAN INNOMINATE UNI L MOD SED  11/23/2018   IR ANGIO VERTEBRAL SEL VERTEBRAL BILAT MOD SED  12/03/2020   IR ANGIO VERTEBRAL SEL VERTEBRAL UNI L MOD SED  12/10/2018   IR ANGIO VERTEBRAL SEL VERTEBRAL UNI R MOD SED  11/23/2018   IR GENERIC HISTORICAL  11/03/2015   IR ANGIO VERTEBRAL SEL VERTEBRAL UNI L MOD SED 11/03/2015 Luanne Bras, MD [facility]   IR GENERIC HISTORICAL  11/03/2015   IR ANGIO INTRA EXTRACRAN SEL COM CAROTID INNOMINATE BILAT MOD SED 11/03/2015 Luanne Bras, MD [facility]   IR GENERIC HISTORICAL  11/03/2015   IR ANGIO VERTEBRAL SEL SUBCLAVIAN INNOMINATE UNI R MOD SED 11/03/2015 Luanne Bras, MD [facility]   IR GENERIC HISTORICAL  11/19/2015   IR RADIOLOGIST EVAL & MGMT 11/19/2015 [facility]   IR US GUIDE VASC ACCESS RIGHT  11/23/2018   IR US GUIDE VASC ACCESS RIGHT   12/03/2020   LEG SURGERY     LOWER EXTREMITY ANGIOGRAM Left 10-04-12   and Left iliac stent   LOWER EXTREMITY ANGIOGRAM N/A 10/04/2012   Procedure: LOWER EXTREMITY ANGIOGRAM;  Surgeon: Jettie Booze, MD;  Location: [facility] CATH LAB;  Service: Cardiovascular;  Laterality: N/A;   RADIOLOGY WITH ANESTHESIA N/A 12/10/2018   Procedure: STENTING;  Surgeon: Luanne Bras, MD;  Location: [facility];  Service: Radiology;  Laterality: N/A;   TUBAL LIGATION      OB History     Gravida  2   Para  2   Term  2   Preterm      AB      Living  2      SAB      IAB      Ectopic      Multiple      Live Births               Home Medications    Prior to Admission medications   Medication Sig Start Date End Date Taking? Authorizing Provider  HYDROcodone-acetaminophen (NORCO/VICODIN) 5-325 MG tablet Take 1 tablet by mouth at bedtime as needed for up to 3 days. 06/30/21 07/03/21 Yes Cedrick Partain, Derry Skill, PA-C  acetaminophen (TYLENOL) 500 MG tablet Take 1,000 mg by mouth 2 (two) times daily as needed for pain.     [provider]  amLODipine (NORVASC) 10 MG tablet Take 10 mg by mouth daily. for high blood pressure 10/08/18   [provider]  aspirin EC 81 MG tablet Take 81 mg by mouth daily.    [provider]  atorvastatin (LIPITOR) 80 MG tablet Take 80 mg by mouth daily.    [provider]  buPROPion (WELLBUTRIN SR) 150 MG 12 hr tablet Take 150 mg by mouth 2 (two) times daily.    [provider]  Calcium-Vitamin D (CALTRATE 600 PLUS-VIT D PO) Take 1 tablet by mouth daily before supper.     [provider]  Cholecalciferol (VITAMIN D3) 10 MCG (400 UNIT) CAPS Take 400 Units by mouth daily.    [provider]  clopidogrel (PLAVIX) 75 MG tablet Take 1 tablet (75 mg total) by mouth daily with breakfast. 10/05/12   Jettie Booze, MD  diclofenac Sodium (VOLTAREN) 1 % GEL Apply 1 application topically 4 (four) times daily as needed  (pain).    [provider]  ferrous sulfate 325 (65 FE) MG tablet Take 325 mg by mouth daily.    [provider]  levothyroxine (SYNTHROID, LEVOTHROID) 25 MCG tablet Take 25 mcg by mouth daily before breakfast.  [provider]  losartan (COZAAR) 50 MG tablet Take 75 mg by mouth daily.    [provider]  Magnesium 200 MG TABS Take 200 mg by mouth daily.    [provider]  Multiple Vitamin (MULTIVITAMIN WITH MINERALS) TABS tablet Take 1 tablet by mouth daily before supper. Centrum Silver    [provider]  nystatin (MYCOSTATIN/NYSTOP) powder Apply 1 application topically 2 (two) times daily as needed (rash). 04/20/20   [provider]  pantoprazole (PROTONIX) 40 MG tablet Take 1 tablet (40 mg total) by mouth daily. 04/27/20   Pyrtle, Lajuan Lines, MD  Probiotic Product (PROBIOTIC PO) Take 1 tablet by mouth daily after lunch.     [provider]  Psyllium (METAMUCIL) 28.3 % POWD Take 1 Dose by mouth daily. 2 teaspoons full = 1 dose    [provider]  Tetrahydrozoline HCl (VISINE OP) Place 1 drop into both eyes daily as needed (irritation/dry eyes).    [provider]  vitamin B-12 (CYANOCOBALAMIN) 1000 MCG tablet Take 1,000 mcg by mouth daily.    [provider]  vitamin C (ASCORBIC ACID) 500 MG tablet Take 500 mg by mouth daily.    [provider]    Family History Family History  Problem Relation Age of Onset   Deep vein thrombosis Mother    Diabetes Mother        Amputation   Heart disease Mother        Heart Disease before age 66   Hyperlipidemia Mother    Hypertension Mother    Heart attack Mother    Stroke Mother    Thyroid disease Mother    Heart disease Father        Heart Disease before age 23   Hypertension Father    Heart attack Father    Hyperlipidemia Father    Heart disease Brother        Heart Disease before age 62   Hyperlipidemia Brother    Hypertension Brother     Heart attack Brother    COPD Brother    Diabetes Brother    Colonic polyp Brother    Colon cancer Neg Hx    Stomach cancer Neg Hx     Social History Social History   Tobacco Use   Smoking status: Every Day    Packs/day: 0.25    Types: Cigarettes   Smokeless tobacco: Never   Tobacco comments:    5 cigarettes smoked daily [facility]J 03/11/20  Vaping Use   Vaping Use: Former  Substance Use Topics   Alcohol use: No    Alcohol/week: 0.0 standard drinks   Drug use: No     Allergies   Fluconazole, Ropinirole, Codeine, Darvon [propoxyphene hcl], and Flagyl [metronidazole]   Review of Systems Review of Systems  Constitutional:  Positive for activity change. Negative for appetite change, fatigue and fever.  Musculoskeletal:  Positive for arthralgias, gait problem and joint swelling. Negative for myalgias.  Skin:  Positive for color change (Bruising medial knee). Negative for wound.  Neurological:  Negative for dizziness, weakness, light-headedness, numbness and headaches.    Physical Exam Triage Vital Signs ED Triage Vitals  Enc Vitals Group     BP 06/30/21 1712 (!) 154/72     Pulse Rate 06/30/21 1712 75     Resp 06/30/21 1712 19     Temp 06/30/21 1712 98 F (36.7 C)     Temp src --      SpO2 06/30/21 1712  97 %     Weight --      Height --      Head Circumference --      Peak Flow --      Pain Score 06/30/21 1710 8     Pain Loc --      Pain Edu? --      Excl. in [place]? --    No data found.  Updated Vital Signs BP (!) 154/72 (BP Location: Right Arm)   Pulse 75   Temp 98 F (36.7 C)   Resp 19   LMP  (LMP Unknown)   SpO2 97%   Visual Acuity Right Eye Distance:   Left Eye Distance:   Bilateral Distance:    Right Eye Near:   Left Eye Near:    Bilateral Near:     Physical Exam Vitals reviewed.  Constitutional:      General: She is awake. She is not in acute distress.    Appearance: Normal appearance. She is well-developed. She is not ill-appearing.      Comments: Very pleasant female appears stated age in no acute distress sitting comfortably in exam room  HENT:     Head: Normocephalic and atraumatic.  Cardiovascular:     Rate and Rhythm: Normal rate and regular rhythm.     Heart sounds: Normal heart sounds, S1 normal and S2 normal. No murmur heard. Pulmonary:     Effort: Pulmonary effort is normal.     Breath sounds: Normal breath sounds. No wheezing, rhonchi or rales.     Comments: Clear to auscultation bilaterally Musculoskeletal:     Right knee: Swelling and ecchymosis present. No deformity. Decreased range of motion. Tenderness present over the medial joint line. No LCL laxity, MCL laxity, ACL laxity or PCL laxity.     Right lower leg: No edema.     Left lower leg: No edema.     Comments: Right knee: Tenderness palpation over medial right knee.  No deformity noted.  No ligamentous laxity on exam.  Strength 5/5 bilateral lower extremities.  Psychiatric:        Behavior: Behavior is cooperative.     UC Treatments / Results  Labs (all labs ordered are listed, but only abnormal results are displayed) Labs Reviewed - No data to display  EKG   Radiology DG Knee Complete 4 Views Right  Result Date: 06/30/2021 CLINICAL DATA:  Pain and swelling after fall. EXAM: RIGHT KNEE - COMPLETE 4+ VIEW COMPARISON:  Right knee radiographs 05/08/2008 FINDINGS: Mild-to-moderate medial compartment joint space narrowing and peripheral osteophytosis. Mild lateral compartment joint space narrowing and peripheral osteophytosis. Severe patellofemoral joint space narrowing with large superior and moderate inferior degenerative osteophytes. Small joint effusion. No acute fracture is seen. No dislocation. Mild vascular calcifications. IMPRESSION: Severe patellofemoral and mild-to-moderate medial compartment osteoarthritis, progressed from prior remote 05/08/2008 radiographs. Electronically Signed   By: Yvonne Kendall M.D.   On: 06/30/2021 17:50     Procedures Procedures (including critical care time)  Medications Ordered in UC Medications - No data to display  Initial Impression / Assessment and Plan / UC Course  I have reviewed the triage vital signs and the nursing notes.  Pertinent labs & imaging results that were available during my care of the patient were reviewed by me and considered in my medical decision making (see chart for details).     Patient denies any head or neck injury during the fall and she denies any alarm symptoms since fall several days  ago or so CT scans were not indicated.  X-ray obtained if needed given severity of pain which showed no acute osseous abnormality but did show significant arthritis.  Recommended conservative treatment measures including RICE protocol patient was placed in Ace bandage today.  She has a history of hypertension and cerebral aneurysm so is not a candidate for NSAIDs.  She has taken and failed Tylenol.  Discussed treatment options including tramadol which she reports that she has taken that was ineffective.  We will prescribe 3 doses of hydrocodone with instruction to take half a tablet to 1 tablet at night as needed for pain.  Discussed side effects with this medication including sedation and she is not to drive or drink alcohol while taking it.  Review of [place] controlled substance database shows no inappropriate refills.  Discussed that if symptoms or not improving she should follow-up with orthopedics and was given contact information for a local provider with instruction to call to schedule an appointment.  Discussed that if she has any worsening symptoms she is to return for reevaluation to which she expressed understanding.  Final Clinical Impressions(s) / UC Diagnoses   Final diagnoses:  Acute pain of right knee  Fall, initial encounter  Arthritis of right knee     Discharge Instructions      Your x-ray did not show any kind of fracture which is great news.  Use  the Ace bandage to provide support and compression.  Keep your knee elevated and use ice.  Take hydrocodone at night.  As we discussed, this can be very sedating so do not drive or drink alcohol with taking it.  You can start by cutting the pill in half.  Take Tylenol during the day for pain relief.  If your symptoms or not improving please follow-up with orthopedics as soon as possible; call to schedule an appointment.  If anything worsens return for reevaluation.     ED Prescriptions     Medication Sig Dispense Auth. Provider   HYDROcodone-acetaminophen (NORCO/VICODIN) 5-325 MG tablet Take 1 tablet by mouth at bedtime as needed for up to 3 days. 3 tablet Roland Prine K, PA-C      I have reviewed the PDMP during this encounter.   Terrilee Croak, PA-C 06/30/21 1821

## 2021-07-19 DIAGNOSIS — K2270 Barrett's esophagus without dysplasia: Secondary | ICD-10-CM | POA: Diagnosis not present

## 2021-07-19 DIAGNOSIS — Z86010 Personal history of colonic polyps: Secondary | ICD-10-CM | POA: Diagnosis not present

## 2021-07-19 DIAGNOSIS — Z7901 Long term (current) use of anticoagulants: Secondary | ICD-10-CM | POA: Diagnosis not present

## 2021-07-19 DIAGNOSIS — K219 Gastro-esophageal reflux disease without esophagitis: Secondary | ICD-10-CM | POA: Diagnosis not present

## 2021-07-19 DIAGNOSIS — R195 Other fecal abnormalities: Secondary | ICD-10-CM | POA: Diagnosis not present

## 2021-07-19 DIAGNOSIS — R110 Nausea: Secondary | ICD-10-CM | POA: Diagnosis not present

## 2021-07-20 ENCOUNTER — Telehealth: Payer: Self-pay

## 2021-07-20 NOTE — Telephone Encounter (Signed)
   Pre-operative Risk Assessment    Patient Name: Vicki Page  DOB: May 27, 1950 MRN: [id number]      Request for Surgical Clearance    Procedure:   Colonoscopy/Endoscopy  Date of Surgery:  Clearance 10/21/21                                 Surgeon:  Hoy Morn Surgeon's Group or Practice Name:  [facility] Phone number:  [phone] Fax number:  [phone]   Type of Clearance Requested:   - Medical  - Pharmacy:  Hold Clopidogrel (Plavix) Can patient temporarily stop before procedure or after the procedure if polyps are removed?   Type of Anesthesia:   Propofol   Additional requests/questions:    Signed, Aprill Banko   07/20/2021, 10:23 AM

## 2021-07-20 NOTE — Telephone Encounter (Signed)
Patient Name: Vicki Page  DOB: March 28, 1950 MRN: [id number]   This patient has not been seen by our team since 2014. Do you wish for her to schedule an appointment for cardiology follow-up? Her Plavix is not currently prescribed by our group.  Emmaline Life, NP-C    07/20/2021, 3:14 PM [place] [address], [address] Office [phone] Fax [phone]

## 2021-07-28 DIAGNOSIS — I2729 Other secondary pulmonary hypertension: Secondary | ICD-10-CM | POA: Diagnosis not present

## 2021-07-28 DIAGNOSIS — R0902 Hypoxemia: Secondary | ICD-10-CM | POA: Diagnosis not present

## 2021-07-28 DIAGNOSIS — I270 Primary pulmonary hypertension: Secondary | ICD-10-CM | POA: Diagnosis not present

## 2021-08-04 DIAGNOSIS — E7800 Pure hypercholesterolemia, unspecified: Secondary | ICD-10-CM | POA: Diagnosis not present

## 2021-08-04 DIAGNOSIS — E039 Hypothyroidism, unspecified: Secondary | ICD-10-CM | POA: Diagnosis not present

## 2021-08-04 DIAGNOSIS — I10 Essential (primary) hypertension: Secondary | ICD-10-CM | POA: Diagnosis not present

## 2021-08-04 DIAGNOSIS — K146 Glossodynia: Secondary | ICD-10-CM | POA: Diagnosis not present

## 2021-08-04 DIAGNOSIS — E118 Type 2 diabetes mellitus with unspecified complications: Secondary | ICD-10-CM | POA: Diagnosis not present

## 2021-08-05 ENCOUNTER — Telehealth (HOSPITAL_COMMUNITY): Payer: Self-pay

## 2021-08-05 NOTE — Telephone Encounter (Signed)
Left message for Danae Chen at Dr. Roma Kayser office to let her know to fax over the Plavix clearance to [phone] and to call myself or Anderson Malta with questions. Phone numbers given. AW

## 2021-08-27 DIAGNOSIS — R0902 Hypoxemia: Secondary | ICD-10-CM | POA: Diagnosis not present

## 2021-08-27 DIAGNOSIS — I2729 Other secondary pulmonary hypertension: Secondary | ICD-10-CM | POA: Diagnosis not present

## 2021-08-27 DIAGNOSIS — I270 Primary pulmonary hypertension: Secondary | ICD-10-CM | POA: Diagnosis not present

## 2021-09-27 DIAGNOSIS — I2729 Other secondary pulmonary hypertension: Secondary | ICD-10-CM | POA: Diagnosis not present

## 2021-09-27 DIAGNOSIS — R0902 Hypoxemia: Secondary | ICD-10-CM | POA: Diagnosis not present

## 2021-09-27 DIAGNOSIS — I270 Primary pulmonary hypertension: Secondary | ICD-10-CM | POA: Diagnosis not present

## 2021-09-28 DIAGNOSIS — E7800 Pure hypercholesterolemia, unspecified: Secondary | ICD-10-CM | POA: Diagnosis not present

## 2021-09-28 DIAGNOSIS — E118 Type 2 diabetes mellitus with unspecified complications: Secondary | ICD-10-CM | POA: Diagnosis not present

## 2021-09-28 DIAGNOSIS — I10 Essential (primary) hypertension: Secondary | ICD-10-CM | POA: Diagnosis not present

## 2021-09-28 DIAGNOSIS — E039 Hypothyroidism, unspecified: Secondary | ICD-10-CM | POA: Diagnosis not present

## 2021-09-30 ENCOUNTER — Ambulatory Visit
Admission: RE | Admit: 2021-09-30 | Discharge: 2021-09-30 | Disposition: A | Discharge status: Home / Self Care | Payer: Medicare Other | Source: Ambulatory Visit | Attending: Acute Care | Admitting: Acute Care

## 2021-09-30 DIAGNOSIS — J439 Emphysema, unspecified: Secondary | ICD-10-CM | POA: Diagnosis not present

## 2021-09-30 DIAGNOSIS — R911 Solitary pulmonary nodule: Secondary | ICD-10-CM

## 2021-09-30 DIAGNOSIS — Z87891 Personal history of nicotine dependence: Secondary | ICD-10-CM

## 2021-09-30 DIAGNOSIS — F17210 Nicotine dependence, cigarettes, uncomplicated: Secondary | ICD-10-CM

## 2021-09-30 DIAGNOSIS — I700 Atherosclerosis of aorta: Secondary | ICD-10-CM | POA: Diagnosis not present

## 2021-10-04 ENCOUNTER — Other Ambulatory Visit: Payer: Self-pay | Admitting: Acute Care

## 2021-10-04 DIAGNOSIS — F17210 Nicotine dependence, cigarettes, uncomplicated: Secondary | ICD-10-CM

## 2021-10-04 DIAGNOSIS — Z87891 Personal history of nicotine dependence: Secondary | ICD-10-CM

## 2021-10-04 DIAGNOSIS — Z122 Encounter for screening for malignant neoplasm of respiratory organs: Secondary | ICD-10-CM

## 2021-10-21 DIAGNOSIS — K2270 Barrett's esophagus without dysplasia: Secondary | ICD-10-CM | POA: Diagnosis not present

## 2021-10-21 DIAGNOSIS — K2930 Chronic superficial gastritis without bleeding: Secondary | ICD-10-CM | POA: Diagnosis not present

## 2021-10-21 DIAGNOSIS — K219 Gastro-esophageal reflux disease without esophagitis: Secondary | ICD-10-CM | POA: Diagnosis not present

## 2021-10-21 DIAGNOSIS — K2950 Unspecified chronic gastritis without bleeding: Secondary | ICD-10-CM | POA: Diagnosis not present

## 2021-10-21 DIAGNOSIS — K2289 Other specified disease of esophagus: Secondary | ICD-10-CM | POA: Diagnosis not present

## 2021-10-21 DIAGNOSIS — Z86010 Personal history of colonic polyps: Secondary | ICD-10-CM | POA: Diagnosis not present

## 2021-10-21 DIAGNOSIS — K209 Esophagitis, unspecified without bleeding: Secondary | ICD-10-CM | POA: Diagnosis not present

## 2021-10-21 DIAGNOSIS — K5730 Diverticulosis of large intestine without perforation or abscess without bleeding: Secondary | ICD-10-CM | POA: Diagnosis not present

## 2021-10-21 DIAGNOSIS — Z09 Encounter for follow-up examination after completed treatment for conditions other than malignant neoplasm: Secondary | ICD-10-CM | POA: Diagnosis not present

## 2021-10-25 DIAGNOSIS — K2289 Other specified disease of esophagus: Secondary | ICD-10-CM | POA: Diagnosis not present

## 2021-10-25 DIAGNOSIS — K219 Gastro-esophageal reflux disease without esophagitis: Secondary | ICD-10-CM | POA: Diagnosis not present

## 2021-10-25 DIAGNOSIS — K2930 Chronic superficial gastritis without bleeding: Secondary | ICD-10-CM | POA: Diagnosis not present

## 2021-10-28 DIAGNOSIS — I270 Primary pulmonary hypertension: Secondary | ICD-10-CM | POA: Diagnosis not present

## 2021-10-28 DIAGNOSIS — I2729 Other secondary pulmonary hypertension: Secondary | ICD-10-CM | POA: Diagnosis not present

## 2021-10-28 DIAGNOSIS — R0902 Hypoxemia: Secondary | ICD-10-CM | POA: Diagnosis not present

## 2021-11-02 ENCOUNTER — Other Ambulatory Visit: Payer: Self-pay | Admitting: Family Medicine

## 2021-11-02 DIAGNOSIS — Z1231 Encounter for screening mammogram for malignant neoplasm of breast: Secondary | ICD-10-CM

## 2021-11-22 ENCOUNTER — Ambulatory Visit
Admission: RE | Admit: 2021-11-22 | Discharge: 2021-11-22 | Disposition: A | Discharge status: Home / Self Care | Payer: Medicare Other | Source: Ambulatory Visit | Attending: Family Medicine | Admitting: Family Medicine

## 2021-11-22 DIAGNOSIS — Z1231 Encounter for screening mammogram for malignant neoplasm of breast: Secondary | ICD-10-CM

## 2021-11-27 DIAGNOSIS — I2729 Other secondary pulmonary hypertension: Secondary | ICD-10-CM | POA: Diagnosis not present

## 2021-11-27 DIAGNOSIS — I270 Primary pulmonary hypertension: Secondary | ICD-10-CM | POA: Diagnosis not present

## 2021-11-27 DIAGNOSIS — R0902 Hypoxemia: Secondary | ICD-10-CM | POA: Diagnosis not present

## 2021-12-01 DIAGNOSIS — E7800 Pure hypercholesterolemia, unspecified: Secondary | ICD-10-CM | POA: Diagnosis not present

## 2021-12-01 DIAGNOSIS — K2270 Barrett's esophagus without dysplasia: Secondary | ICD-10-CM | POA: Diagnosis not present

## 2021-12-01 DIAGNOSIS — N183 Chronic kidney disease, stage 3 unspecified: Secondary | ICD-10-CM | POA: Diagnosis not present

## 2021-12-01 DIAGNOSIS — E039 Hypothyroidism, unspecified: Secondary | ICD-10-CM | POA: Diagnosis not present

## 2021-12-01 DIAGNOSIS — Z23 Encounter for immunization: Secondary | ICD-10-CM | POA: Diagnosis not present

## 2021-12-01 DIAGNOSIS — I10 Essential (primary) hypertension: Secondary | ICD-10-CM | POA: Diagnosis not present

## 2021-12-01 DIAGNOSIS — I679 Cerebrovascular disease, unspecified: Secondary | ICD-10-CM | POA: Diagnosis not present

## 2021-12-01 DIAGNOSIS — I739 Peripheral vascular disease, unspecified: Secondary | ICD-10-CM | POA: Diagnosis not present

## 2021-12-01 DIAGNOSIS — Z0000 Encounter for general adult medical examination without abnormal findings: Secondary | ICD-10-CM | POA: Diagnosis not present

## 2021-12-01 DIAGNOSIS — E118 Type 2 diabetes mellitus with unspecified complications: Secondary | ICD-10-CM | POA: Diagnosis not present

## 2021-12-13 ENCOUNTER — Other Ambulatory Visit (HOSPITAL_COMMUNITY): Payer: Self-pay | Admitting: Interventional Radiology

## 2021-12-13 DIAGNOSIS — I729 Aneurysm of unspecified site: Secondary | ICD-10-CM

## 2021-12-23 ENCOUNTER — Ambulatory Visit (HOSPITAL_COMMUNITY)
Admission: RE | Admit: 2021-12-23 | Discharge: 2021-12-23 | Disposition: A | Discharge status: Home / Self Care | Payer: Medicare Other | Source: Ambulatory Visit | Attending: Interventional Radiology | Admitting: Interventional Radiology

## 2021-12-23 DIAGNOSIS — I729 Aneurysm of unspecified site: Secondary | ICD-10-CM

## 2021-12-23 DIAGNOSIS — R519 Headache, unspecified: Secondary | ICD-10-CM | POA: Diagnosis not present

## 2021-12-28 DIAGNOSIS — I270 Primary pulmonary hypertension: Secondary | ICD-10-CM | POA: Diagnosis not present

## 2021-12-28 DIAGNOSIS — R0902 Hypoxemia: Secondary | ICD-10-CM | POA: Diagnosis not present

## 2021-12-28 DIAGNOSIS — I2729 Other secondary pulmonary hypertension: Secondary | ICD-10-CM | POA: Diagnosis not present

## 2022-01-03 ENCOUNTER — Ambulatory Visit (HOSPITAL_COMMUNITY)
Admission: RE | Admit: 2022-01-03 | Discharge: 2022-01-03 | Disposition: A | Discharge status: Home / Self Care | Payer: Medicare Other | Source: Ambulatory Visit | Attending: Interventional Radiology | Admitting: Interventional Radiology

## 2022-01-03 ENCOUNTER — Telehealth (HOSPITAL_COMMUNITY): Payer: Self-pay

## 2022-01-03 NOTE — Telephone Encounter (Signed)
Called to reschedule consult, no answer, left vm. AW  

## 2022-01-11 ENCOUNTER — Encounter (HOSPITAL_COMMUNITY): Payer: Self-pay

## 2022-01-11 ENCOUNTER — Ambulatory Visit (HOSPITAL_COMMUNITY): Admission: RE | Admit: 2022-01-11 | Payer: Medicare Other | Source: Ambulatory Visit

## 2022-01-11 ENCOUNTER — Telehealth (HOSPITAL_COMMUNITY): Payer: Self-pay

## 2022-01-11 NOTE — Telephone Encounter (Signed)
Called to reschedule consult, no answer, left vm. AW  

## 2022-01-21 DIAGNOSIS — E119 Type 2 diabetes mellitus without complications: Secondary | ICD-10-CM | POA: Diagnosis not present

## 2022-01-27 DIAGNOSIS — I2729 Other secondary pulmonary hypertension: Secondary | ICD-10-CM | POA: Diagnosis not present

## 2022-01-27 DIAGNOSIS — I270 Primary pulmonary hypertension: Secondary | ICD-10-CM | POA: Diagnosis not present

## 2022-01-27 DIAGNOSIS — R0902 Hypoxemia: Secondary | ICD-10-CM | POA: Diagnosis not present

## 2022-02-01 IMAGING — MG MM DIGITAL SCREENING BILAT W/ TOMO AND CAD
8 series · 8 of 24 positions shown · non-contrast
Comparison: Previous exam(s).

CLINICAL DATA: Screening.

EXAM:
DIGITAL SCREENING BILATERAL MAMMOGRAM WITH TOMOSYNTHESIS AND CAD
TECHNIQUE: Bilateral screening digital craniocaudal and mediolateral oblique
mammograms were obtained. Bilateral screening digital breast
tomosynthesis was performed. The images were evaluated with
computer-aided detection.

[L CC synth-2D]
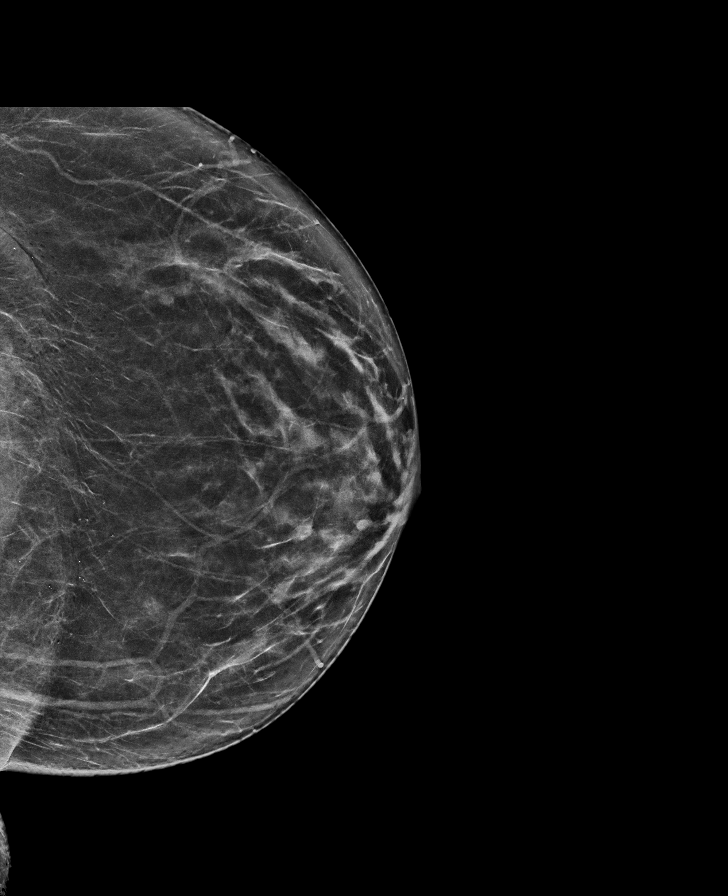

[L MLO synth-2D]
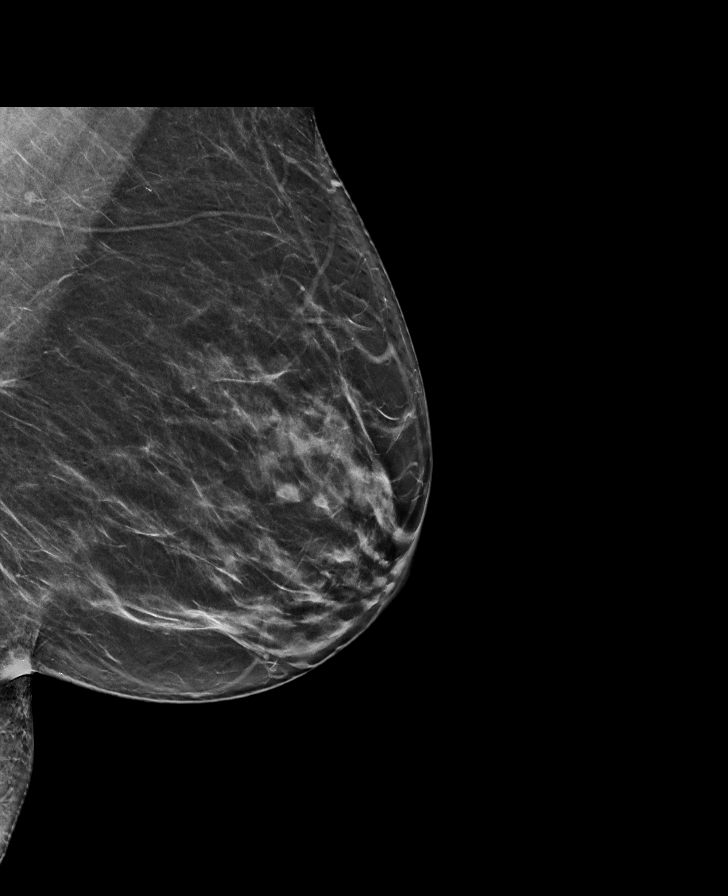

[R MLO synth-2D]
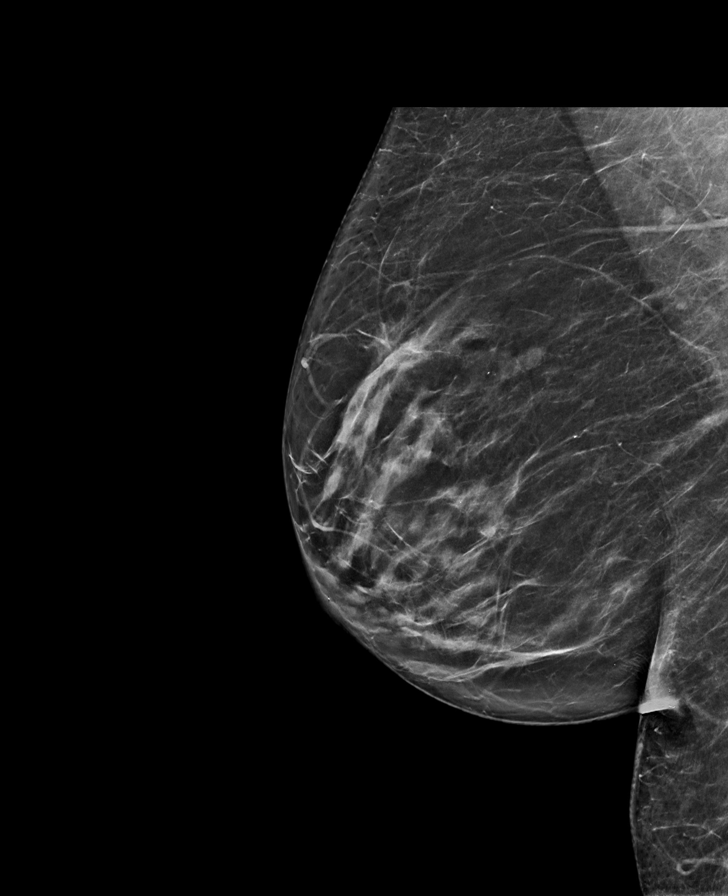

[R CC synth-2D]
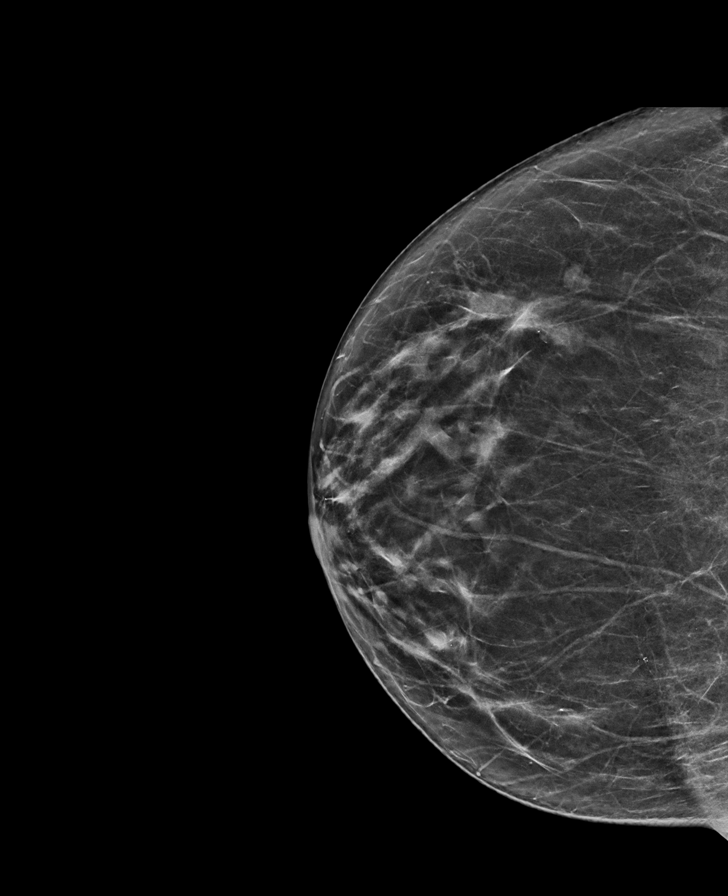

[R MLO tomo · tomo slice 41/82.0]
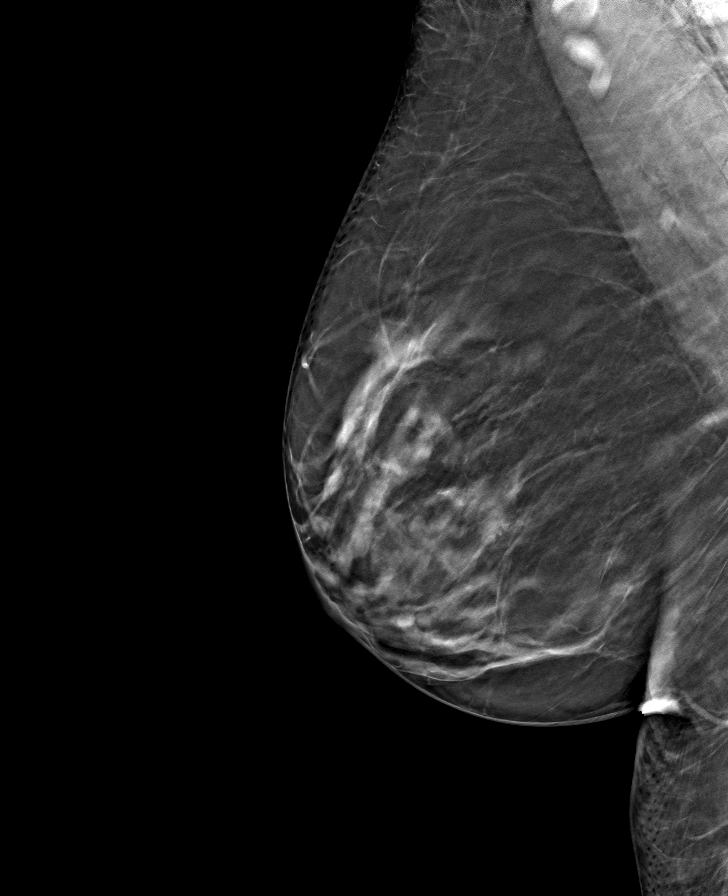

[R CC tomo · tomo slice 35/70.0]
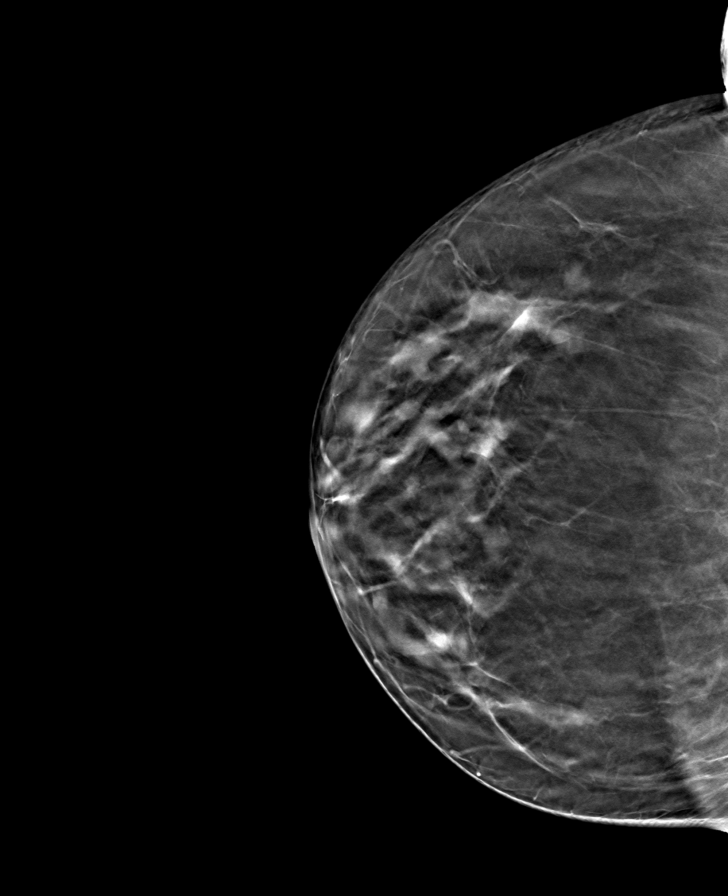

[L CC tomo · tomo slice 37/73.0]
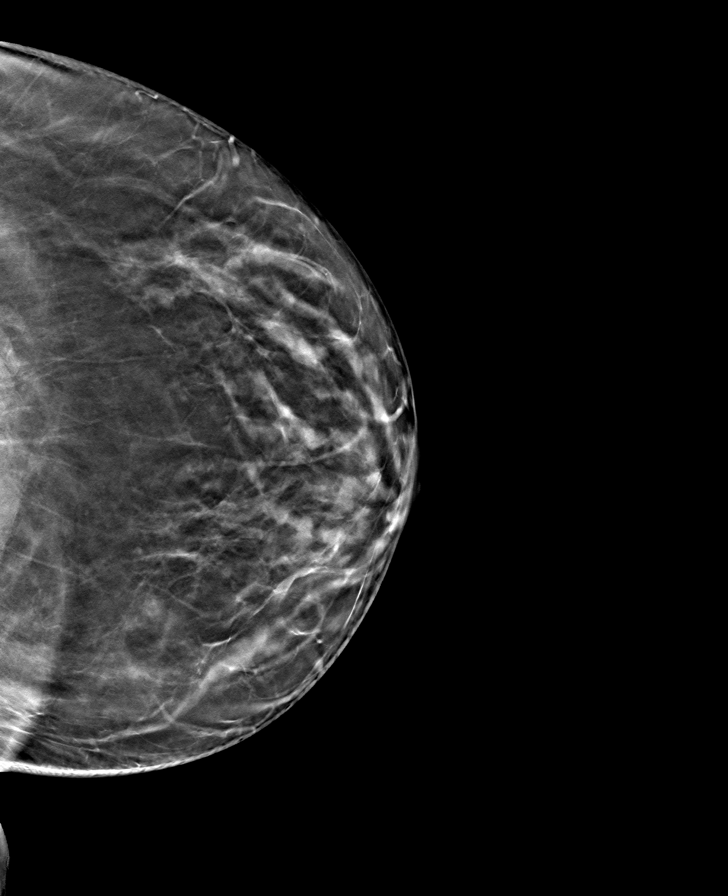

[L MLO tomo · tomo slice 39/76.0]
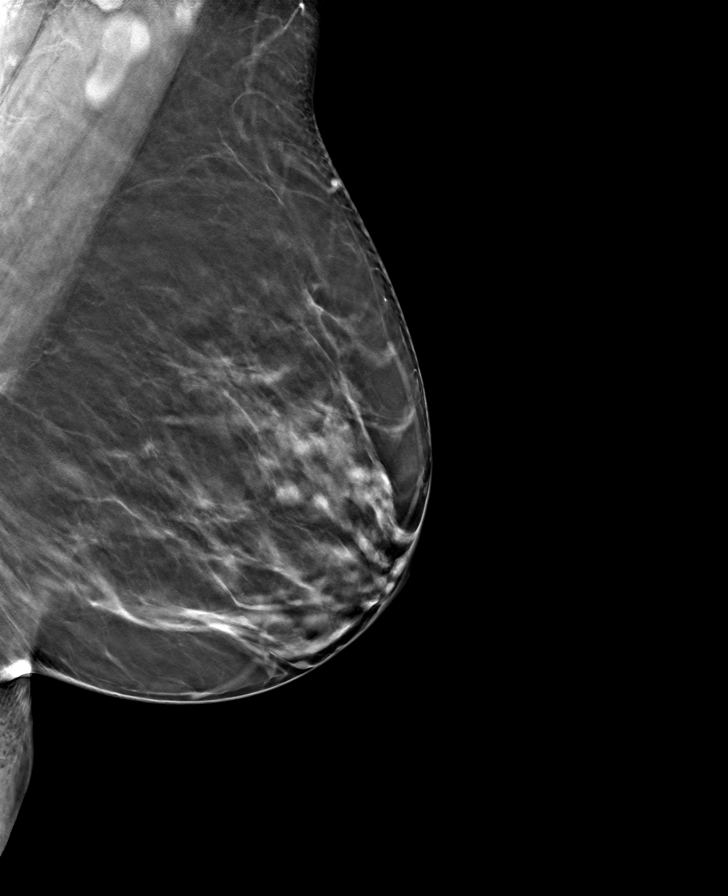

[8 of 24 positions shown; findings below may reference images not displayed]

ACR Breast Density Category b: There are scattered areas of
fibroglandular density.
FINDINGS: There are no findings suspicious for malignancy.
IMPRESSION: No mammographic evidence of malignancy. A result letter of this
screening mammogram will be mailed directly to the patient.

RECOMMENDATION:
Screening mammogram in one year. (Code:51-O-LD2)

BI-RADS CATEGORY  1: Negative.

## 2022-02-14 ENCOUNTER — Ambulatory Visit (HOSPITAL_COMMUNITY)
Admission: RE | Admit: 2022-02-14 | Discharge: 2022-02-14 | Disposition: A | Discharge status: Home / Self Care | Payer: Medicare Other | Source: Ambulatory Visit | Attending: Interventional Radiology | Admitting: Interventional Radiology

## 2022-02-15 HISTORY — PX: IR RADIOLOGIST EVAL & MGMT: IMG5224

## 2022-02-16 NOTE — Progress Notes (Signed)
Fax received for medical clearance/medication hold for Dr. Brabham.  Provider signed, form faxed back to sender, verified successful, sent to scan center.  

## 2022-02-27 DIAGNOSIS — I2729 Other secondary pulmonary hypertension: Secondary | ICD-10-CM | POA: Diagnosis not present

## 2022-02-27 DIAGNOSIS — I270 Primary pulmonary hypertension: Secondary | ICD-10-CM | POA: Diagnosis not present

## 2022-02-27 DIAGNOSIS — R0902 Hypoxemia: Secondary | ICD-10-CM | POA: Diagnosis not present

## 2022-03-08 ENCOUNTER — Other Ambulatory Visit (HOSPITAL_COMMUNITY): Payer: Self-pay | Admitting: Interventional Radiology

## 2022-03-08 DIAGNOSIS — I671 Cerebral aneurysm, nonruptured: Secondary | ICD-10-CM

## 2022-03-18 ENCOUNTER — Encounter: Payer: Self-pay | Admitting: Internal Medicine

## 2022-03-21 DIAGNOSIS — B354 Tinea corporis: Secondary | ICD-10-CM | POA: Diagnosis not present

## 2022-03-21 DIAGNOSIS — L0889 Other specified local infections of the skin and subcutaneous tissue: Secondary | ICD-10-CM | POA: Diagnosis not present

## 2022-03-22 ENCOUNTER — Ambulatory Visit (HOSPITAL_COMMUNITY)
Admission: RE | Admit: 2022-03-22 | Discharge: 2022-03-22 | Disposition: A | Discharge status: Home / Self Care | Payer: Medicare Other | Source: Ambulatory Visit | Attending: Interventional Radiology | Admitting: Interventional Radiology

## 2022-03-22 DIAGNOSIS — I6503 Occlusion and stenosis of bilateral vertebral arteries: Secondary | ICD-10-CM | POA: Diagnosis not present

## 2022-03-22 DIAGNOSIS — I6523 Occlusion and stenosis of bilateral carotid arteries: Secondary | ICD-10-CM | POA: Diagnosis not present

## 2022-03-22 DIAGNOSIS — M47812 Spondylosis without myelopathy or radiculopathy, cervical region: Secondary | ICD-10-CM | POA: Diagnosis not present

## 2022-03-22 DIAGNOSIS — I671 Cerebral aneurysm, nonruptured: Secondary | ICD-10-CM | POA: Diagnosis not present

## 2022-03-22 DIAGNOSIS — I672 Cerebral atherosclerosis: Secondary | ICD-10-CM | POA: Diagnosis not present

## 2022-03-22 DIAGNOSIS — Q283 Other malformations of cerebral vessels: Secondary | ICD-10-CM | POA: Diagnosis not present

## 2022-03-22 LAB — POCT I-STAT CREATININE: Creatinine, Ser: 1.2 mg/dL — ABNORMAL HIGH (ref 0.44–1.00)

## 2022-03-22 MED ORDER — SODIUM CHLORIDE (PF) 0.9 % IJ SOLN
INTRAMUSCULAR | Status: AC
  Filled 2022-03-22: qty 50

## 2022-03-22 MED ORDER — IOHEXOL 350 MG/ML SOLN
100.0000 mL | Freq: Once | INTRAVENOUS | Status: AC | PRN
  Administered 2022-03-22: 75 mL via INTRAVENOUS

## 2022-03-28 ENCOUNTER — Telehealth (HOSPITAL_COMMUNITY): Payer: Self-pay

## 2022-03-28 NOTE — Telephone Encounter (Signed)
Pt agreed to f/u in 6 months with a cta head/neck. AB

## 2022-03-30 DIAGNOSIS — R0902 Hypoxemia: Secondary | ICD-10-CM | POA: Diagnosis not present

## 2022-03-30 DIAGNOSIS — L0889 Other specified local infections of the skin and subcutaneous tissue: Secondary | ICD-10-CM | POA: Diagnosis not present

## 2022-03-30 DIAGNOSIS — B369 Superficial mycosis, unspecified: Secondary | ICD-10-CM | POA: Diagnosis not present

## 2022-03-30 DIAGNOSIS — I270 Primary pulmonary hypertension: Secondary | ICD-10-CM | POA: Diagnosis not present

## 2022-03-30 DIAGNOSIS — I2729 Other secondary pulmonary hypertension: Secondary | ICD-10-CM | POA: Diagnosis not present

## 2022-04-28 DIAGNOSIS — R0902 Hypoxemia: Secondary | ICD-10-CM | POA: Diagnosis not present

## 2022-04-28 DIAGNOSIS — I270 Primary pulmonary hypertension: Secondary | ICD-10-CM | POA: Diagnosis not present

## 2022-04-28 DIAGNOSIS — I2729 Other secondary pulmonary hypertension: Secondary | ICD-10-CM | POA: Diagnosis not present

## 2022-05-03 DIAGNOSIS — B372 Candidiasis of skin and nail: Secondary | ICD-10-CM | POA: Diagnosis not present

## 2022-05-03 DIAGNOSIS — L089 Local infection of the skin and subcutaneous tissue, unspecified: Secondary | ICD-10-CM | POA: Diagnosis not present

## 2022-05-29 DIAGNOSIS — R0902 Hypoxemia: Secondary | ICD-10-CM | POA: Diagnosis not present

## 2022-05-29 DIAGNOSIS — I270 Primary pulmonary hypertension: Secondary | ICD-10-CM | POA: Diagnosis not present

## 2022-05-29 DIAGNOSIS — I2729 Other secondary pulmonary hypertension: Secondary | ICD-10-CM | POA: Diagnosis not present

## 2022-06-28 DIAGNOSIS — I270 Primary pulmonary hypertension: Secondary | ICD-10-CM | POA: Diagnosis not present

## 2022-06-28 DIAGNOSIS — I2729 Other secondary pulmonary hypertension: Secondary | ICD-10-CM | POA: Diagnosis not present

## 2022-06-28 DIAGNOSIS — R0902 Hypoxemia: Secondary | ICD-10-CM | POA: Diagnosis not present

## 2022-07-29 DIAGNOSIS — R0902 Hypoxemia: Secondary | ICD-10-CM | POA: Diagnosis not present

## 2022-07-29 DIAGNOSIS — I270 Primary pulmonary hypertension: Secondary | ICD-10-CM | POA: Diagnosis not present

## 2022-07-29 DIAGNOSIS — I2729 Other secondary pulmonary hypertension: Secondary | ICD-10-CM | POA: Diagnosis not present

## 2022-08-28 DIAGNOSIS — I270 Primary pulmonary hypertension: Secondary | ICD-10-CM | POA: Diagnosis not present

## 2022-08-28 DIAGNOSIS — R0902 Hypoxemia: Secondary | ICD-10-CM | POA: Diagnosis not present

## 2022-08-28 DIAGNOSIS — I2729 Other secondary pulmonary hypertension: Secondary | ICD-10-CM | POA: Diagnosis not present

## 2022-09-19 DIAGNOSIS — K921 Melena: Secondary | ICD-10-CM | POA: Diagnosis not present

## 2022-09-19 DIAGNOSIS — E559 Vitamin D deficiency, unspecified: Secondary | ICD-10-CM | POA: Diagnosis not present

## 2022-09-19 DIAGNOSIS — R110 Nausea: Secondary | ICD-10-CM | POA: Diagnosis not present

## 2022-09-19 DIAGNOSIS — R5383 Other fatigue: Secondary | ICD-10-CM | POA: Diagnosis not present

## 2022-09-19 DIAGNOSIS — R1010 Upper abdominal pain, unspecified: Secondary | ICD-10-CM | POA: Diagnosis not present

## 2022-09-19 DIAGNOSIS — E039 Hypothyroidism, unspecified: Secondary | ICD-10-CM | POA: Diagnosis not present

## 2022-09-19 DIAGNOSIS — R195 Other fecal abnormalities: Secondary | ICD-10-CM | POA: Diagnosis not present

## 2022-09-28 DIAGNOSIS — I2729 Other secondary pulmonary hypertension: Secondary | ICD-10-CM | POA: Diagnosis not present

## 2022-09-28 DIAGNOSIS — R0902 Hypoxemia: Secondary | ICD-10-CM | POA: Diagnosis not present

## 2022-09-28 DIAGNOSIS — I270 Primary pulmonary hypertension: Secondary | ICD-10-CM | POA: Diagnosis not present

## 2022-09-30 DIAGNOSIS — R198 Other specified symptoms and signs involving the digestive system and abdomen: Secondary | ICD-10-CM | POA: Diagnosis not present

## 2022-09-30 DIAGNOSIS — R195 Other fecal abnormalities: Secondary | ICD-10-CM | POA: Diagnosis not present

## 2022-09-30 DIAGNOSIS — R140 Abdominal distension (gaseous): Secondary | ICD-10-CM | POA: Diagnosis not present

## 2022-10-03 ENCOUNTER — Ambulatory Visit
Admission: RE | Admit: 2022-10-03 | Discharge: 2022-10-03 | Disposition: A | Discharge status: Home / Self Care | Payer: Medicare Other | Source: Ambulatory Visit | Attending: Acute Care | Admitting: Acute Care

## 2022-10-03 DIAGNOSIS — Z122 Encounter for screening for malignant neoplasm of respiratory organs: Secondary | ICD-10-CM

## 2022-10-03 DIAGNOSIS — F17210 Nicotine dependence, cigarettes, uncomplicated: Secondary | ICD-10-CM

## 2022-10-03 DIAGNOSIS — Z87891 Personal history of nicotine dependence: Secondary | ICD-10-CM | POA: Diagnosis not present

## 2022-10-04 DIAGNOSIS — R195 Other fecal abnormalities: Secondary | ICD-10-CM | POA: Diagnosis not present

## 2022-10-11 ENCOUNTER — Telehealth: Payer: Self-pay | Admitting: Acute Care

## 2022-10-11 DIAGNOSIS — R911 Solitary pulmonary nodule: Secondary | ICD-10-CM

## 2022-10-11 NOTE — Telephone Encounter (Signed)
I have called the patient to talk with her about the reading of her CT Scan both this year and last year. I explained that there was notation of this same area of concern in her previous 09/2021 CT chest. I explained that due to a processing error, the nodule was not picked up at that time, and was resulted as a Lung RADS 2: nodules that are benign in appearance and behavior with a very low likelihood of becoming a clinically active cancer due to size or lack of growth. Recommendation per radiology at that time was for a repeat LDCT in 12 months. This scan was done 09/2022. I explained that the nodule of concern was picked up on this years scan, and that in total transparency we needed to let her know this. A PET scan is ordered and is scheduled for 10/21/2022. We will see the patient in the office after her PET scan to review th results. Patient verbalized understanding of the above. I have told her to call the office with any questions or concerns.  I will complete a safety zone portal to document the event.

## 2022-10-11 NOTE — Telephone Encounter (Signed)
Results/plan faxed to PCP 

## 2022-10-11 NOTE — Telephone Encounter (Signed)
I have called the patient with the results of her low dose Ct Chest.  Her scan was read as a 4B. There is a nodule in the left upper lobe that is 20.5 mm with a solid component of 13.3 mm.  I explained that the next step is to do a PET scan to better evaluate the nodule for growth. She is in agreement with this plan. She will then come in to be seen by Dr. Tonia Brooms to review the results of the PET scan once it has been done  and determine next best steps for care.  I have ordered the PET scan.  Angelique Blonder, please fax results to PCP and let them know plan is for a PET scan with follow up. Thanks so much

## 2022-10-21 ENCOUNTER — Ambulatory Visit (HOSPITAL_COMMUNITY)
Admission: RE | Admit: 2022-10-21 | Discharge: 2022-10-21 | Disposition: A | Discharge status: Home / Self Care | Payer: Medicare Other | Source: Ambulatory Visit | Attending: Acute Care | Admitting: Acute Care

## 2022-10-21 DIAGNOSIS — R911 Solitary pulmonary nodule: Secondary | ICD-10-CM | POA: Insufficient documentation

## 2022-10-21 LAB — GLUCOSE, CAPILLARY: Glucose-Capillary: 95 mg/dL (ref 70–99)

## 2022-10-21 MED ORDER — FLUDEOXYGLUCOSE F - 18 (FDG) INJECTION
7.0000 | Freq: Once | INTRAVENOUS | Status: AC | PRN
  Administered 2022-10-21: 6.6 via INTRAVENOUS

## 2022-10-25 ENCOUNTER — Other Ambulatory Visit: Payer: Self-pay | Admitting: Family Medicine

## 2022-10-25 DIAGNOSIS — Z1231 Encounter for screening mammogram for malignant neoplasm of breast: Secondary | ICD-10-CM

## 2022-10-26 ENCOUNTER — Telehealth: Payer: Self-pay | Admitting: Acute Care

## 2022-10-26 NOTE — Telephone Encounter (Signed)
Call report

## 2022-10-26 NOTE — Telephone Encounter (Signed)
Call report noted and routed to provider for review  IMPRESSION: 1. 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 There is also hypermetabolism in the left hilar region, presumably a metastatic lymph node. No distal metastatic disease noted elsewhere in the neck, abdomen or pelvis. 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 2. Aortic atherosclerosis, in addition to left main and three-vessel coronary artery disease. Please note that although the presence of coronary artery calcium documents the presence of coronary artery disease, the severity of this disease and any potential stenosis cannot be assessed on this non-gated CT examination. Assessment for potential risk factor modification, dietary therapy or pharmacologic therapy may be warranted, if clinically indicated. 3. Mild centrilobular and paraseptal emphysema.

## 2022-10-27 ENCOUNTER — Telehealth: Payer: Self-pay | Admitting: Acute Care

## 2022-10-27 NOTE — Telephone Encounter (Signed)
Routed results of PET /plan for biopsy to PCP

## 2022-10-27 NOTE — Telephone Encounter (Signed)
I have called Ms. Fannin with the results of her PET scan.  I explained that the nodule of concern in her left upper lobe does demonstrate hypermetabolism and is concerning for a primary bronchogenic adenocarcinoma.  There is also a left hilar lymph node that is hypermetabolic on the scan.  I explained to the patient the next step would be to do a biopsy of this area to confirm whether this is a cancer or not.  We discussed that biopsy is done with a bronchoscopy.  We discussed that she would be put to sleep with general anesthesia to do this procedure.  We also discussed that she would have to stop taking her Plavix for 48 hours prior to the procedure.  She is not on any diabetic medications. Patient is scheduled to see Dr. Tonia Brooms on 10/31/2022 at 1:30 in the afternoon for a quick 15-minute visit just so she can meet him and go over what a biopsy entails.  I did discuss this with her today however she opted to still have the visit on Monday. When she is seen by Dr. Tonia Brooms we will need to schedule her biopsy with either Dr. Tonia Brooms or Dr. Delton Coombes whoever has earliest availability so that we can get diagnosis and then determine next steps in regard to plan of care. Patient did verbalize understanding of the above and had no further questions at completion of the call. Sherre Lain, and [place] please keep on tickle list in the event that biopsy is negative for lung cancer. Please fax results to PCP and let her know plan is for biopsy to better evaluate the nodule of concern and that we will let her know results.  Thank you so much

## 2022-10-31 ENCOUNTER — Ambulatory Visit: Payer: Medicare Other | Admitting: Pulmonary Disease

## 2022-10-31 ENCOUNTER — Encounter: Payer: Self-pay | Admitting: Pulmonary Disease

## 2022-10-31 ENCOUNTER — Encounter: Payer: Self-pay | Admitting: Emergency Medicine

## 2022-10-31 VITALS — BP 138/62 | HR 58 | Ht 61.5 in | Wt 140.0 lb

## 2022-10-31 DIAGNOSIS — F17200 Nicotine dependence, unspecified, uncomplicated: Secondary | ICD-10-CM | POA: Diagnosis not present

## 2022-10-31 DIAGNOSIS — R942 Abnormal results of pulmonary function studies: Secondary | ICD-10-CM

## 2022-10-31 DIAGNOSIS — R911 Solitary pulmonary nodule: Secondary | ICD-10-CM

## 2022-10-31 NOTE — Patient Instructions (Addendum)
Thank you for visiting Dr. Tonia Brooms at [facility]. Today we recommend the following:  Orders Placed This Encounter  Procedures   Procedural/ Surgical Case Request: ROBOTIC ASSISTED NAVIGATIONAL BRONCHOSCOPY   CT Super D Chest Wo Contrast   Ambulatory referral to Pulmonology    Return in about 22 days (around 11/22/2022) for with Kandice Robinsons, NP.    Please do your part to reduce the spread of COVID-19.

## 2022-10-31 NOTE — H&P (View-Only) (Signed)
Synopsis: Referred in September 2024 for pulmonary nodule by Aliene Beams, MD  Subjective:   PATIENT ID: Vicki Page GENDER: female DOB: 05-Dec-1950, MRN: [id number]  Chief Complaint  Patient presents with   CT results    This is a 72 year old female, past medical history of Barrett's esophagus, carotid artery disease, gastroesophageal reflux, DVT, hypertension, hyperlipidemia.  Patient was referred for evaluation after lung cancer screening CT was complete.  Lung cancer screening CT revealed a aggressive appearing left upper lobe pulmonary nodule that had been enlarging.  Patient's initial CT scan was then August 2023.  The nodule itself was not documented in the August 2023 scanned addendum was made.  Case has been discussed with Dr. Llana Aliment.  She has had a subsequent nuclear medicine PET scan and case was reviewed at thoracic oncology conference.  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    Past Medical History:  Diagnosis Date   Adenomatous colon polyp    Aneurysm (HCC)    brain x 2   Anxiety    Arthritis    Atherosclerosis    Barrett's esophagus    Carotid artery occlusion    Cataracts, bilateral    just watching   DDD (degenerative disc disease), lumbar    Depression    Diverticulosis    DVT (deep venous thrombosis) (HCC)    left leg   Fall at home May 30, 2014   Pt slipped in tub- hurt left hip and right shoulder   GERD (gastroesophageal reflux disease)    H. pylori infection    Hyperlipidemia    Hypertension    Hypothyroidism    IBS (irritable bowel syndrome)    Osteoarthritis    Poor sleep 04/28/2020   Pre-diabetes    no meds, diet controlled   RLS (restless legs syndrome)    Tubular adenoma of colon    Ulcer of the stomach and intestine      Family History  Problem Relation Age of Onset   Deep vein thrombosis Mother    Diabetes Mother        Amputation   Heart disease  Mother        Heart Disease before age 33   Hyperlipidemia Mother    Hypertension Mother    Heart attack Mother    Stroke Mother    Thyroid disease Mother    Heart disease Father        Heart Disease before age 51   Hypertension Father    Heart attack Father    Hyperlipidemia Father    Heart disease Brother        Heart Disease before age 42   Hyperlipidemia Brother    Hypertension Brother    Heart attack Brother    COPD Brother    Diabetes Brother    Colonic polyp Brother    Colon cancer Neg Hx    Stomach cancer Neg Hx      Past Surgical History:  Procedure Laterality Date   ANAL RECTAL MANOMETRY N/A 05/04/2016   Procedure: ANO RECTAL MANOMETRY;  Surgeon: Napoleon Form, MD;  Location: [facility];  Service: Endoscopy;  Laterality: N/A;   ANEURYSM COILING  July 26, 2010   stent   BRAIN SURGERY     Per patient " Left side behind eyes   INSERTION OF ILIAC STENT  10/04/2012   Procedure: INSERTION OF ILIAC STENT;  Surgeon: Corky Crafts,  MD;  Location: [facility];  Service: Cardiovascular;;  Left External Iliac Artery   IR ANGIO INTRA EXTRACRAN SEL COM CAROTID INNOMINATE BILAT MOD SED  11/23/2018   IR ANGIO INTRA EXTRACRAN SEL COM CAROTID INNOMINATE BILAT MOD SED  12/03/2020   IR ANGIO VERTEBRAL SEL SUBCLAVIAN INNOMINATE UNI L MOD SED  11/23/2018   IR ANGIO VERTEBRAL SEL VERTEBRAL BILAT MOD SED  12/03/2020   IR ANGIO VERTEBRAL SEL VERTEBRAL UNI L MOD SED  12/10/2018   IR ANGIO VERTEBRAL SEL VERTEBRAL UNI R MOD SED  11/23/2018   IR GENERIC HISTORICAL  11/03/2015   IR ANGIO VERTEBRAL SEL VERTEBRAL UNI L MOD SED 11/03/2015 Julieanne Cotton, MD [facility]   IR GENERIC HISTORICAL  11/03/2015   IR ANGIO INTRA EXTRACRAN SEL COM CAROTID INNOMINATE BILAT MOD SED 11/03/2015 Julieanne Cotton, MD [facility]   IR GENERIC HISTORICAL  11/03/2015   IR ANGIO VERTEBRAL SEL SUBCLAVIAN INNOMINATE UNI R MOD SED 11/03/2015 Julieanne Cotton, MD [facility]   IR GENERIC HISTORICAL   11/19/2015   IR RADIOLOGIST EVAL & MGMT 11/19/2015 [facility]   IR RADIOLOGIST EVAL & MGMT  02/15/2022   IR US GUIDE VASC ACCESS RIGHT  11/23/2018   IR US GUIDE VASC ACCESS RIGHT  12/03/2020   LEG SURGERY     LOWER EXTREMITY ANGIOGRAM Left 10-04-12   and Left iliac stent   LOWER EXTREMITY ANGIOGRAM N/A 10/04/2012   Procedure: LOWER EXTREMITY ANGIOGRAM;  Surgeon: Corky Crafts, MD;  Location: [facility];  Service: Cardiovascular;  Laterality: N/A;   RADIOLOGY WITH ANESTHESIA N/A 12/10/2018   Procedure: STENTING;  Surgeon: Julieanne Cotton, MD;  Location: [facility];  Service: Radiology;  Laterality: N/A;   TUBAL LIGATION      Social History   Socioeconomic History   Marital status: Single    Spouse name: Not on file   Number of children: 2   Years of education: 16   Highest education level: Not on file  Occupational History   Occupation: Retired  Tobacco Use   Smoking status: Every Day    Current packs/day: 0.25    Types: Cigarettes   Smokeless tobacco: Never   Tobacco comments:    5 cigarettes smoked daily ARJ 03/11/20  Vaping Use   Vaping status: Former  Substance and Sexual Activity   Alcohol use: No    Alcohol/week: 0.0 standard drinks of alcohol   Drug use: No   Sexual activity: Never    Birth control/protection: Post-menopausal  Other Topics Concern   Not on file  Social History Narrative   Lives alone   Caffeine use: none   Right handed   Social Determinants of Health   Financial Resource Strain: Not on file  Food Insecurity: Not on file  Transportation Needs: Not on file  Physical Activity: Not on file  Stress: Not on file  Social Connections: Not on file  Intimate Partner Violence: Not on file     Allergies  Allergen Reactions   Fluconazole     Mouth swelling and soreness    Ropinirole Nausea And Vomiting   Codeine Nausea And Vomiting   Darvon [Propoxyphene Hcl] Nausea And Vomiting   Flagyl [Metronidazole] Nausea And Vomiting     Outpatient  Medications Prior to Visit  Medication Sig Dispense Refill   acetaminophen (TYLENOL) 500 MG tablet Take 1,000 mg by mouth 2 (two) times daily as needed for pain.      amLODipine (NORVASC) 10 MG tablet Take 10 mg  by mouth daily. for high blood pressure     aspirin EC 81 MG tablet Take 81 mg by mouth daily.     atorvastatin (LIPITOR) 80 MG tablet Take 80 mg by mouth daily.     buPROPion (WELLBUTRIN SR) 150 MG 12 hr tablet Take 150 mg by mouth 2 (two) times daily.     Calcium-Vitamin D (CALTRATE 600 PLUS-VIT D PO) Take 1 tablet by mouth daily before supper.      Cholecalciferol (VITAMIN D3) 10 MCG (400 UNIT) CAPS Take 400 Units by mouth daily.     clopidogrel (PLAVIX) 75 MG tablet Take 1 tablet (75 mg total) by mouth daily with breakfast. 30 tablet 2   diclofenac Sodium (VOLTAREN) 1 % GEL Apply 1 application topically 4 (four) times daily as needed (pain).     levothyroxine (SYNTHROID, LEVOTHROID) 25 MCG tablet Take 25 mcg by mouth daily before breakfast.      losartan (COZAAR) 50 MG tablet Take 75 mg by mouth daily.     Magnesium 200 MG TABS Take 200 mg by mouth daily.     Multiple Vitamin (MULTIVITAMIN WITH MINERALS) TABS tablet Take 1 tablet by mouth daily before supper. Centrum Silver     ondansetron (ZOFRAN) 8 MG tablet Take 8 mg by mouth every 8 (eight) hours as needed.     pantoprazole (PROTONIX) 40 MG tablet Take 1 tablet (40 mg total) by mouth daily. 30 tablet 2   Probiotic Product (PROBIOTIC PO) Take 1 tablet by mouth daily after lunch.      Psyllium (METAMUCIL) 28.3 % POWD Take 1 Dose by mouth daily. 2 teaspoons full = 1 dose     Tetrahydrozoline HCl (VISINE OP) Place 1 drop into both eyes daily as needed (irritation/dry eyes).     vitamin B-12 (CYANOCOBALAMIN) 1000 MCG tablet Take 1,000 mcg by mouth daily.     vitamin C (ASCORBIC ACID) 500 MG tablet Take 500 mg by mouth daily.     escitalopram (LEXAPRO) 10 MG tablet Take 10 mg by mouth daily.     ferrous sulfate 325 (65 FE) MG tablet  Take 325 mg by mouth daily.     nystatin (MYCOSTATIN/NYSTOP) powder Apply 1 application topically 2 (two) times daily as needed (rash).     No facility-administered medications prior to visit.    Review of Systems  Constitutional:  Negative for chills, fever, malaise/fatigue and weight loss.  HENT:  Negative for hearing loss, sore throat and tinnitus.   Eyes:  Negative for blurred vision and double vision.  Respiratory:  Negative for cough, hemoptysis, sputum production, shortness of breath, wheezing and stridor.   Cardiovascular:  Negative for chest pain, palpitations, orthopnea, leg swelling and PND.  Gastrointestinal:  Negative for abdominal pain, constipation, diarrhea, heartburn, nausea and vomiting.  Genitourinary:  Negative for dysuria, hematuria and urgency.  Musculoskeletal:  Negative for joint pain and myalgias.  Skin:  Negative for itching and rash.  Neurological:  Negative for dizziness, tingling, weakness and headaches.  Endo/Heme/Allergies:  Negative for environmental allergies. Does not bruise/bleed easily.  Psychiatric/Behavioral:  Negative for depression. The patient is not nervous/anxious and does not have insomnia.   All other systems reviewed and are negative.    Objective:  Physical Exam Vitals reviewed.  Constitutional:      General: She is not in acute distress.    Appearance: She is well-developed.  HENT:     Head: Normocephalic and atraumatic.  Eyes:     General: No scleral icterus.  Conjunctiva/sclera: Conjunctivae normal.     Pupils: Pupils are equal, round, and reactive to light.  Neck:     Vascular: No JVD.     Trachea: No tracheal deviation.  Cardiovascular:     Rate and Rhythm: Normal rate and regular rhythm.     Heart sounds: Normal heart sounds. No murmur heard. Pulmonary:     Effort: Pulmonary effort is normal. No tachypnea, accessory muscle usage or respiratory distress.     Breath sounds: No stridor. No wheezing, rhonchi or rales.      Comments: Severely diminished breath sounds bilaterally Abdominal:     General: There is no distension.     Palpations: Abdomen is soft.     Tenderness: There is no abdominal tenderness.  Musculoskeletal:        General: No tenderness.     Cervical back: Neck supple.  Lymphadenopathy:     Cervical: No cervical adenopathy.  Skin:    General: Skin is warm and dry.     Capillary Refill: Capillary refill takes less than 2 seconds.     Findings: No rash.  Neurological:     Mental Status: She is alert and oriented to person, place, and time.  Psychiatric:        Behavior: Behavior normal.      Vitals:   10/31/22 1326  BP: 138/62  Pulse: (!) 58  SpO2: 99%  Weight: 140 lb (63.5 kg)  Height: 5' 1.5" (1.562 m)   99% on RA BMI Readings from Last 3 Encounters:  10/31/22 26.02 kg/m  12/03/20 25.95 kg/m  07/29/20 26.16 kg/m   Wt Readings from Last 3 Encounters:  10/31/22 140 lb (63.5 kg)  12/03/20 139 lb 9.6 oz (63.3 kg)  07/29/20 143 lb (64.9 kg)     CBC    Component Value Date/Time   WBC 5.6 12/03/2020 1107   RBC 5.34 (H) 12/03/2020 1107   HGB 16.3 (H) 12/03/2020 1309   HCT 48.0 (H) 12/03/2020 1309   PLT 307 12/03/2020 1107   MCV 86.5 12/03/2020 1107   MCH 28.5 12/03/2020 1107   MCHC 32.9 12/03/2020 1107   RDW 15.7 (H) 12/03/2020 1107   LYMPHSABS 1.4 12/10/2018 0706   MONOABS 0.4 12/10/2018 0706   EOSABS 0.0 12/10/2018 0706   BASOSABS 0.0 12/10/2018 0706      Chest Imaging:  Nuclear medicine pet imaging 10/21/2022: Patient was found to have a hypermetabolic lesion in the left upper lobe concerning for malignancy.  As well as hypermetabolism within the left hilum concerning for T1 cN1 MX disease. The patient's images have been independently reviewed by me.    Pulmonary Functions Testing Results:     No data to display          FeNO:   Pathology:   Echocardiogram:   Heart Catheterization:     Assessment & Plan:     ICD-10-CM   1. Lung nodule   R91.1 Procedural/ Surgical Case Request: ROBOTIC ASSISTED NAVIGATIONAL BRONCHOSCOPY    Ambulatory referral to Pulmonology    CT Super D Chest Wo Contrast    2. Abnormal PET scan, lung  R94.2     3. Smoker  F17.200       Discussion:  This is a 72 year old female longstanding history of tobacco abuse, abnormal lung cancer screening CT.  Nodule that was not documented in 2023 CT scan now shows enlargement.  Nuclear medicine pet imaging now in September 2024 shows hilar adenopathy concerning for stage II disease.  Plan: I discussed her PET scan results today. I think that she needs to have urgent bronchoscopy and biopsy. We will need to obtain clearance from from Dr. Abe People to stop Plavix. Tentative bronchoscopy date will be on October 7 with Dr. Delton Coombes. She will need robotic assisted navigational bronchoscopy with tissue sampling as well as video bronchoscopy endobronchial ultrasound of the mediastinal lymph nodes. Patient is agreeable to this plan. We appreciate PCC's help with scheduling.   Current Outpatient Medications:    acetaminophen (TYLENOL) 500 MG tablet, Take 1,000 mg by mouth 2 (two) times daily as needed for pain. , Disp: , Rfl:    amLODipine (NORVASC) 10 MG tablet, Take 10 mg by mouth daily. for high blood pressure, Disp: , Rfl:    aspirin EC 81 MG tablet, Take 81 mg by mouth daily., Disp: , Rfl:    atorvastatin (LIPITOR) 80 MG tablet, Take 80 mg by mouth daily., Disp: , Rfl:    buPROPion (WELLBUTRIN SR) 150 MG 12 hr tablet, Take 150 mg by mouth 2 (two) times daily., Disp: , Rfl:    Calcium-Vitamin D (CALTRATE 600 PLUS-VIT D PO), Take 1 tablet by mouth daily before supper. , Disp: , Rfl:    Cholecalciferol (VITAMIN D3) 10 MCG (400 UNIT) CAPS, Take 400 Units by mouth daily., Disp: , Rfl:    clopidogrel (PLAVIX) 75 MG tablet, Take 1 tablet (75 mg total) by mouth daily with breakfast., Disp: 30 tablet, Rfl: 2   diclofenac Sodium (VOLTAREN) 1 % GEL, Apply 1 application  topically 4 (four) times daily as needed (pain)., Disp: , Rfl:    levothyroxine (SYNTHROID, LEVOTHROID) 25 MCG tablet, Take 25 mcg by mouth daily before breakfast. , Disp: , Rfl:    losartan (COZAAR) 50 MG tablet, Take 75 mg by mouth daily., Disp: , Rfl:    Magnesium 200 MG TABS, Take 200 mg by mouth daily., Disp: , Rfl:    Multiple Vitamin (MULTIVITAMIN WITH MINERALS) TABS tablet, Take 1 tablet by mouth daily before supper. Centrum Silver, Disp: , Rfl:    ondansetron (ZOFRAN) 8 MG tablet, Take 8 mg by mouth every 8 (eight) hours as needed., Disp: , Rfl:    pantoprazole (PROTONIX) 40 MG tablet, Take 1 tablet (40 mg total) by mouth daily., Disp: 30 tablet, Rfl: 2   Probiotic Product (PROBIOTIC PO), Take 1 tablet by mouth daily after lunch. , Disp: , Rfl:    Psyllium (METAMUCIL) 28.3 % POWD, Take 1 Dose by mouth daily. 2 teaspoons full = 1 dose, Disp: , Rfl:    Tetrahydrozoline HCl (VISINE OP), Place 1 drop into both eyes daily as needed (irritation/dry eyes)., Disp: , Rfl:    vitamin B-12 (CYANOCOBALAMIN) 1000 MCG tablet, Take 1,000 mcg by mouth daily., Disp: , Rfl:    vitamin C (ASCORBIC ACID) 500 MG tablet, Take 500 mg by mouth daily., Disp: , Rfl:    Josephine Igo, DO [facility] 10/31/2022 1:49 PM

## 2022-10-31 NOTE — Progress Notes (Signed)
Synopsis: Referred in September 2024 for pulmonary nodule by Aliene Beams, MD  Subjective:   PATIENT ID: Vicki Page GENDER: female DOB: 05-Dec-1950, MRN: [id number]  Chief Complaint  Patient presents with   CT results    This is a 72 year old female, past medical history of Barrett's esophagus, carotid artery disease, gastroesophageal reflux, DVT, hypertension, hyperlipidemia.  Patient was referred for evaluation after lung cancer screening CT was complete.  Lung cancer screening CT revealed a aggressive appearing left upper lobe pulmonary nodule that had been enlarging.  Patient's initial CT scan was then August 2023.  The nodule itself was not documented in the August 2023 scanned addendum was made.  Case has been discussed with Dr. Llana Aliment.  She has had a subsequent nuclear medicine PET scan and case was reviewed at thoracic oncology conference.  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    Past Medical History:  Diagnosis Date   Adenomatous colon polyp    Aneurysm (HCC)    brain x 2   Anxiety    Arthritis    Atherosclerosis    Barrett's esophagus    Carotid artery occlusion    Cataracts, bilateral    just watching   DDD (degenerative disc disease), lumbar    Depression    Diverticulosis    DVT (deep venous thrombosis) (HCC)    left leg   Fall at home May 30, 2014   Pt slipped in tub- hurt left hip and right shoulder   GERD (gastroesophageal reflux disease)    H. pylori infection    Hyperlipidemia    Hypertension    Hypothyroidism    IBS (irritable bowel syndrome)    Osteoarthritis    Poor sleep 04/28/2020   Pre-diabetes    no meds, diet controlled   RLS (restless legs syndrome)    Tubular adenoma of colon    Ulcer of the stomach and intestine      Family History  Problem Relation Age of Onset   Deep vein thrombosis Mother    Diabetes Mother        Amputation   Heart disease  Mother        Heart Disease before age 33   Hyperlipidemia Mother    Hypertension Mother    Heart attack Mother    Stroke Mother    Thyroid disease Mother    Heart disease Father        Heart Disease before age 51   Hypertension Father    Heart attack Father    Hyperlipidemia Father    Heart disease Brother        Heart Disease before age 42   Hyperlipidemia Brother    Hypertension Brother    Heart attack Brother    COPD Brother    Diabetes Brother    Colonic polyp Brother    Colon cancer Neg Hx    Stomach cancer Neg Hx      Past Surgical History:  Procedure Laterality Date   ANAL RECTAL MANOMETRY N/A 05/04/2016   Procedure: ANO RECTAL MANOMETRY;  Surgeon: Napoleon Form, MD;  Location: [facility];  Service: Endoscopy;  Laterality: N/A;   ANEURYSM COILING  July 26, 2010   stent   BRAIN SURGERY     Per patient " Left side behind eyes   INSERTION OF ILIAC STENT  10/04/2012   Procedure: INSERTION OF ILIAC STENT;  Surgeon: Corky Crafts,  MD;  Location: [facility];  Service: Cardiovascular;;  Left External Iliac Artery   IR ANGIO INTRA EXTRACRAN SEL COM CAROTID INNOMINATE BILAT MOD SED  11/23/2018   IR ANGIO INTRA EXTRACRAN SEL COM CAROTID INNOMINATE BILAT MOD SED  12/03/2020   IR ANGIO VERTEBRAL SEL SUBCLAVIAN INNOMINATE UNI L MOD SED  11/23/2018   IR ANGIO VERTEBRAL SEL VERTEBRAL BILAT MOD SED  12/03/2020   IR ANGIO VERTEBRAL SEL VERTEBRAL UNI L MOD SED  12/10/2018   IR ANGIO VERTEBRAL SEL VERTEBRAL UNI R MOD SED  11/23/2018   IR GENERIC HISTORICAL  11/03/2015   IR ANGIO VERTEBRAL SEL VERTEBRAL UNI L MOD SED 11/03/2015 Julieanne Cotton, MD [facility]   IR GENERIC HISTORICAL  11/03/2015   IR ANGIO INTRA EXTRACRAN SEL COM CAROTID INNOMINATE BILAT MOD SED 11/03/2015 Julieanne Cotton, MD [facility]   IR GENERIC HISTORICAL  11/03/2015   IR ANGIO VERTEBRAL SEL SUBCLAVIAN INNOMINATE UNI R MOD SED 11/03/2015 Julieanne Cotton, MD [facility]   IR GENERIC HISTORICAL   11/19/2015   IR RADIOLOGIST EVAL & MGMT 11/19/2015 [facility]   IR RADIOLOGIST EVAL & MGMT  02/15/2022   IR US GUIDE VASC ACCESS RIGHT  11/23/2018   IR US GUIDE VASC ACCESS RIGHT  12/03/2020   LEG SURGERY     LOWER EXTREMITY ANGIOGRAM Left 10-04-12   and Left iliac stent   LOWER EXTREMITY ANGIOGRAM N/A 10/04/2012   Procedure: LOWER EXTREMITY ANGIOGRAM;  Surgeon: Corky Crafts, MD;  Location: [facility] CATH [facility];  Service: Cardiovascular;  Laterality: N/A;   RADIOLOGY WITH ANESTHESIA N/A 12/10/2018   Procedure: STENTING;  Surgeon: Julieanne Cotton, MD;  Location: [facility];  Service: Radiology;  Laterality: N/A;   TUBAL LIGATION      Social History   Socioeconomic History   Marital status: Single    Spouse name: Not on file   Number of children: 2   Years of education: 16   Highest education level: Not on file  Occupational History   Occupation: Retired  Tobacco Use   Smoking status: Every Day    Current packs/day: 0.25    Types: Cigarettes   Smokeless tobacco: Never   Tobacco comments:    5 cigarettes smoked daily ARJ 03/11/20  Vaping Use   Vaping status: Former  Substance and Sexual Activity   Alcohol use: No    Alcohol/week: 0.0 standard drinks of alcohol   Drug use: No   Sexual activity: Never    Birth control/protection: Post-menopausal  Other Topics Concern   Not on file  Social History Narrative   Lives alone   Caffeine use: none   Right handed   Social Determinants of Health   Financial Resource Strain: Not on file  Food Insecurity: Not on file  Transportation Needs: Not on file  Physical Activity: Not on file  Stress: Not on file  Social Connections: Not on file  Intimate Partner Violence: Not on file     Allergies  Allergen Reactions   Fluconazole     Mouth swelling and soreness    Ropinirole Nausea And Vomiting   Codeine Nausea And Vomiting   Darvon [Propoxyphene Hcl] Nausea And Vomiting   Flagyl [Metronidazole] Nausea And Vomiting     Outpatient  Medications Prior to Visit  Medication Sig Dispense Refill   acetaminophen (TYLENOL) 500 MG tablet Take 1,000 mg by mouth 2 (two) times daily as needed for pain.      amLODipine (NORVASC) 10 MG tablet Take 10 mg  by mouth daily. for high blood pressure     aspirin EC 81 MG tablet Take 81 mg by mouth daily.     atorvastatin (LIPITOR) 80 MG tablet Take 80 mg by mouth daily.     buPROPion (WELLBUTRIN SR) 150 MG 12 hr tablet Take 150 mg by mouth 2 (two) times daily.     Calcium-Vitamin D (CALTRATE 600 PLUS-VIT D PO) Take 1 tablet by mouth daily before supper.      Cholecalciferol (VITAMIN D3) 10 MCG (400 UNIT) CAPS Take 400 Units by mouth daily.     clopidogrel (PLAVIX) 75 MG tablet Take 1 tablet (75 mg total) by mouth daily with breakfast. 30 tablet 2   diclofenac Sodium (VOLTAREN) 1 % GEL Apply 1 application topically 4 (four) times daily as needed (pain).     levothyroxine (SYNTHROID, LEVOTHROID) 25 MCG tablet Take 25 mcg by mouth daily before breakfast.      losartan (COZAAR) 50 MG tablet Take 75 mg by mouth daily.     Magnesium 200 MG TABS Take 200 mg by mouth daily.     Multiple Vitamin (MULTIVITAMIN WITH MINERALS) TABS tablet Take 1 tablet by mouth daily before supper. Centrum Silver     ondansetron (ZOFRAN) 8 MG tablet Take 8 mg by mouth every 8 (eight) hours as needed.     pantoprazole (PROTONIX) 40 MG tablet Take 1 tablet (40 mg total) by mouth daily. 30 tablet 2   Probiotic Product (PROBIOTIC PO) Take 1 tablet by mouth daily after lunch.      Psyllium (METAMUCIL) 28.3 % POWD Take 1 Dose by mouth daily. 2 teaspoons full = 1 dose     Tetrahydrozoline HCl (VISINE OP) Place 1 drop into both eyes daily as needed (irritation/dry eyes).     vitamin B-12 (CYANOCOBALAMIN) 1000 MCG tablet Take 1,000 mcg by mouth daily.     vitamin C (ASCORBIC ACID) 500 MG tablet Take 500 mg by mouth daily.     escitalopram (LEXAPRO) 10 MG tablet Take 10 mg by mouth daily.     ferrous sulfate 325 (65 FE) MG tablet  Take 325 mg by mouth daily.     nystatin (MYCOSTATIN/NYSTOP) powder Apply 1 application topically 2 (two) times daily as needed (rash).     No facility-administered medications prior to visit.    Review of Systems  Constitutional:  Negative for chills, fever, malaise/fatigue and weight loss.  HENT:  Negative for hearing loss, sore throat and tinnitus.   Eyes:  Negative for blurred vision and double vision.  Respiratory:  Negative for cough, hemoptysis, sputum production, shortness of breath, wheezing and stridor.   Cardiovascular:  Negative for chest pain, palpitations, orthopnea, leg swelling and PND.  Gastrointestinal:  Negative for abdominal pain, constipation, diarrhea, heartburn, nausea and vomiting.  Genitourinary:  Negative for dysuria, hematuria and urgency.  Musculoskeletal:  Negative for joint pain and myalgias.  Skin:  Negative for itching and rash.  Neurological:  Negative for dizziness, tingling, weakness and headaches.  Endo/Heme/Allergies:  Negative for environmental allergies. Does not bruise/bleed easily.  Psychiatric/Behavioral:  Negative for depression. The patient is not nervous/anxious and does not have insomnia.   All other systems reviewed and are negative.    Objective:  Physical Exam Vitals reviewed.  Constitutional:      General: She is not in acute distress.    Appearance: She is well-developed.  HENT:     Head: Normocephalic and atraumatic.  Eyes:     General: No scleral icterus.  Conjunctiva/sclera: Conjunctivae normal.     Pupils: Pupils are equal, round, and reactive to light.  Neck:     Vascular: No JVD.     Trachea: No tracheal deviation.  Cardiovascular:     Rate and Rhythm: Normal rate and regular rhythm.     Heart sounds: Normal heart sounds. No murmur heard. Pulmonary:     Effort: Pulmonary effort is normal. No tachypnea, accessory muscle usage or respiratory distress.     Breath sounds: No stridor. No wheezing, rhonchi or rales.      Comments: Severely diminished breath sounds bilaterally Abdominal:     General: There is no distension.     Palpations: Abdomen is soft.     Tenderness: There is no abdominal tenderness.  Musculoskeletal:        General: No tenderness.     Cervical back: Neck supple.  Lymphadenopathy:     Cervical: No cervical adenopathy.  Skin:    General: Skin is warm and dry.     Capillary Refill: Capillary refill takes less than 2 seconds.     Findings: No rash.  Neurological:     Mental Status: She is alert and oriented to person, place, and time.  Psychiatric:        Behavior: Behavior normal.      Vitals:   10/31/22 1326  BP: 138/62  Pulse: (!) 58  SpO2: 99%  Weight: 140 lb (63.5 kg)  Height: 5' 1.5" (1.562 m)   99% on RA BMI Readings from Last 3 Encounters:  10/31/22 26.02 kg/m  12/03/20 25.95 kg/m  07/29/20 26.16 kg/m   Wt Readings from Last 3 Encounters:  10/31/22 140 lb (63.5 kg)  12/03/20 139 lb 9.6 oz (63.3 kg)  07/29/20 143 lb (64.9 kg)     CBC    Component Value Date/Time   WBC 5.6 12/03/2020 1107   RBC 5.34 (H) 12/03/2020 1107   HGB 16.3 (H) 12/03/2020 1309   HCT 48.0 (H) 12/03/2020 1309   PLT 307 12/03/2020 1107   MCV 86.5 12/03/2020 1107   MCH 28.5 12/03/2020 1107   MCHC 32.9 12/03/2020 1107   RDW 15.7 (H) 12/03/2020 1107   LYMPHSABS 1.4 12/10/2018 0706   MONOABS 0.4 12/10/2018 0706   EOSABS 0.0 12/10/2018 0706   BASOSABS 0.0 12/10/2018 0706      Chest Imaging:  Nuclear medicine pet imaging 10/21/2022: Patient was found to have a hypermetabolic lesion in the left upper lobe concerning for malignancy.  As well as hypermetabolism within the left hilum concerning for T1 cN1 MX disease. The patient's images have been independently reviewed by me.    Pulmonary Functions Testing Results:     No data to display          FeNO:   Pathology:   Echocardiogram:   Heart Catheterization:     Assessment & Plan:     ICD-10-CM   1. Lung nodule   R91.1 Procedural/ Surgical Case Request: ROBOTIC ASSISTED NAVIGATIONAL BRONCHOSCOPY    Ambulatory referral to Pulmonology    CT Super D Chest Wo Contrast    2. Abnormal PET scan, lung  R94.2     3. Smoker  F17.200       Discussion:  This is a 72 year old female longstanding history of tobacco abuse, abnormal lung cancer screening CT.  Nodule that was not documented in 2023 CT scan now shows enlargement.  Nuclear medicine pet imaging now in September 2024 shows hilar adenopathy concerning for stage II disease.  Plan: I discussed her PET scan results today. I think that she needs to have urgent bronchoscopy and biopsy. We will need to obtain clearance from from Dr. Abe People to stop Plavix. Tentative bronchoscopy date will be on October 7 with Dr. Delton Coombes. She will need robotic assisted navigational bronchoscopy with tissue sampling as well as video bronchoscopy endobronchial ultrasound of the mediastinal lymph nodes. Patient is agreeable to this plan. We appreciate PCC's help with scheduling.   Current Outpatient Medications:    acetaminophen (TYLENOL) 500 MG tablet, Take 1,000 mg by mouth 2 (two) times daily as needed for pain. , Disp: , Rfl:    amLODipine (NORVASC) 10 MG tablet, Take 10 mg by mouth daily. for high blood pressure, Disp: , Rfl:    aspirin EC 81 MG tablet, Take 81 mg by mouth daily., Disp: , Rfl:    atorvastatin (LIPITOR) 80 MG tablet, Take 80 mg by mouth daily., Disp: , Rfl:    buPROPion (WELLBUTRIN SR) 150 MG 12 hr tablet, Take 150 mg by mouth 2 (two) times daily., Disp: , Rfl:    Calcium-Vitamin D (CALTRATE 600 PLUS-VIT D PO), Take 1 tablet by mouth daily before supper. , Disp: , Rfl:    Cholecalciferol (VITAMIN D3) 10 MCG (400 UNIT) CAPS, Take 400 Units by mouth daily., Disp: , Rfl:    clopidogrel (PLAVIX) 75 MG tablet, Take 1 tablet (75 mg total) by mouth daily with breakfast., Disp: 30 tablet, Rfl: 2   diclofenac Sodium (VOLTAREN) 1 % GEL, Apply 1 application  topically 4 (four) times daily as needed (pain)., Disp: , Rfl:    levothyroxine (SYNTHROID, LEVOTHROID) 25 MCG tablet, Take 25 mcg by mouth daily before breakfast. , Disp: , Rfl:    losartan (COZAAR) 50 MG tablet, Take 75 mg by mouth daily., Disp: , Rfl:    Magnesium 200 MG TABS, Take 200 mg by mouth daily., Disp: , Rfl:    Multiple Vitamin (MULTIVITAMIN WITH MINERALS) TABS tablet, Take 1 tablet by mouth daily before supper. Centrum Silver, Disp: , Rfl:    ondansetron (ZOFRAN) 8 MG tablet, Take 8 mg by mouth every 8 (eight) hours as needed., Disp: , Rfl:    pantoprazole (PROTONIX) 40 MG tablet, Take 1 tablet (40 mg total) by mouth daily., Disp: 30 tablet, Rfl: 2   Probiotic Product (PROBIOTIC PO), Take 1 tablet by mouth daily after lunch. , Disp: , Rfl:    Psyllium (METAMUCIL) 28.3 % POWD, Take 1 Dose by mouth daily. 2 teaspoons full = 1 dose, Disp: , Rfl:    Tetrahydrozoline HCl (VISINE OP), Place 1 drop into both eyes daily as needed (irritation/dry eyes)., Disp: , Rfl:    vitamin B-12 (CYANOCOBALAMIN) 1000 MCG tablet, Take 1,000 mcg by mouth daily., Disp: , Rfl:    vitamin C (ASCORBIC ACID) 500 MG tablet, Take 500 mg by mouth daily., Disp: , Rfl:    Josephine Igo, DO [facility] 10/31/2022 1:49 PM

## 2022-11-07 ENCOUNTER — Telehealth: Payer: Self-pay | Admitting: *Deleted

## 2022-11-07 ENCOUNTER — Ambulatory Visit (HOSPITAL_COMMUNITY)
Admission: RE | Admit: 2022-11-07 | Discharge: 2022-11-07 | Disposition: A | Discharge status: Home / Self Care | Payer: Medicare Other | Source: Ambulatory Visit | Attending: Family Medicine | Admitting: Family Medicine

## 2022-11-07 ENCOUNTER — Telehealth: Payer: Self-pay | Admitting: Acute Care

## 2022-11-07 DIAGNOSIS — R918 Other nonspecific abnormal finding of lung field: Secondary | ICD-10-CM | POA: Diagnosis not present

## 2022-11-07 DIAGNOSIS — R911 Solitary pulmonary nodule: Secondary | ICD-10-CM | POA: Insufficient documentation

## 2022-11-07 DIAGNOSIS — I2729 Other secondary pulmonary hypertension: Secondary | ICD-10-CM

## 2022-11-07 DIAGNOSIS — I700 Atherosclerosis of aorta: Secondary | ICD-10-CM | POA: Diagnosis not present

## 2022-11-07 DIAGNOSIS — J439 Emphysema, unspecified: Secondary | ICD-10-CM | POA: Diagnosis not present

## 2022-11-07 NOTE — Telephone Encounter (Signed)
Name: Vicki Page  DOB: [birth year]  MRN: [id number]  Primary Cardiologist: Lance Muss, MD  Chart reviewed as part of pre-operative protocol coverage.  Patient will need to establish care with our practice since being lost to follow-up since 2014.   Pre-op covering staff: - Please schedule appointment and call patient to inform them. If patient already had an upcoming appointment within acceptable timeframe, please add "pre-op clearance" to the appointment notes so provider is aware. - Please contact requesting surgeon's office via preferred method (i.e, phone, fax) to inform them of need for appointment prior to surgery.    Napoleon Form, Leodis Rains, NP  11/07/2022, 12:12 PM

## 2022-11-07 NOTE — Telephone Encounter (Signed)
Pre-operative Risk Assessment    Patient Name: Vicki Page  DOB: April 26, 1950 MRN: [id number]   DATE OF LAST VISIT:  NONE DATE OF NEXT VISIT: NONE PT HAS NOT FOLLOWED OUR PRACTICE SINCE 2014 AND WILL  NEED A NEW PT APPT. REFERRAL WILL NEED TO BE SENT TO OUR OFFICE FROM PULMONARY.  Request for Surgical Clearance    Procedure: ROBOTIC ASSISTED   BRONCHOSCOPY  Date of Surgery:  Clearance TBD                                 Surgeon:  DR. Delton Coombes Surgeon's Group or Practice Name:  [facility] P[facility] Phone number:  [phone]  Fax number:  [phone]   Type of Clearance Requested:   - Medical ; ASA AND PLAVIX    Type of Anesthesia:  General    Additional requests/questions:    Elpidio Anis   11/07/2022, 12:02 PM   Clinical Received: Today Gaston Islam., NP  P Cv Div Preop Callback      Letter Reason for letter: Clinical Josephine Igo, DO on 10/31/2022     [facility] at [facility] [address] [place], [place]  [address] Phone:  [phone]   Fax:  [phone]   Faxed: 10/31/2022   Pre-Operative Risk Assessment   Patient: Vicki Page                MRN: [id number]           DOB: Jul 27, 1950 Procedure: BRONCHOSCOPY _X_  Preoperative Risk Assessment Needed (See Below) __  Preoperative Risk Assessment not needed (FYI Only)   Dear Dr. Cleda Clarks: [phone]: [phone]   Your patient is planning to undergo the above orthopedic procedure.  We would appreciate your evaluation and opinion on risk assessment.  We can NOT  Perform the surgery until this assessment is received by [place].   Thank you for allowing [place] to care for your patient.   Sincerely,   Audie Box, MD   Please check all that apply __  Optimized for surgery from a medical and cardiac standpoint __  Optimized for surgery from a medical standpoint only __  Optimized for surgery from a cardiac standpoint only __  HgB A1C is _______ (Less  than 7.5 is required for surgery) __  BMI is _______ (Less than 40 is required for surgery) __ Anticoagulation / Medication: __________________________________ is ok to stop       _____days prior to surgery   **Please send recent notes, labs, EKG or special studies with this form   Physician Signature:_________________________________________Date:_________________   Additional Instructions:___________________________________________________________________ _____________________________________________________________________________ _____________________________________________________________________________   Authorization for release of medical records:    Patient Signature:__________________________Date:______________ **Please return completed evaluation and any pertinent records to [place] at  [phone]    [facility] at [facility], [phone]

## 2022-11-07 NOTE — Telephone Encounter (Signed)
Patient is confused about the amount of appointments that is having. I tried to explain to her that she is having a CT scan, broncosopy and a follow up. She would like more clarification on why she is getting it done. Please call and advise.

## 2022-11-07 NOTE — Telephone Encounter (Addendum)
Message has been sent to our scheduling team to scheduled the pt a new pt appt as the pt was last followed by our practice in 2014.   I will send FYI to requesting office that we will need a referral for new pt appt.

## 2022-11-07 NOTE — Addendum Note (Signed)
Addended by: Leslye Peer on: 11/07/2022 02:20 PM   Modules accepted: Orders

## 2022-11-08 ENCOUNTER — Telehealth: Payer: Self-pay | Admitting: Acute Care

## 2022-11-08 NOTE — Telephone Encounter (Signed)
Routed message to [facility] for scheduling bronchs

## 2022-11-08 NOTE — Telephone Encounter (Signed)
Need another date for this bronch to be scheduled

## 2022-11-08 NOTE — Telephone Encounter (Signed)
Contacted the pt in regards to her MyChart message we rcvd. Patient wanted clarification on her appts. Pt will need to rescheduled for her bronch. Please assist.

## 2022-11-08 NOTE — Telephone Encounter (Signed)
Dr Delton Coombes when would you like me to get this resc

## 2022-11-09 ENCOUNTER — Encounter: Payer: Self-pay | Admitting: Emergency Medicine

## 2022-11-09 NOTE — Telephone Encounter (Signed)
Scheduled for  11/21/2022 patient aware

## 2022-11-09 NOTE — Telephone Encounter (Signed)
The next days I could schedule would be either 10/15 or 10/15

## 2022-11-10 NOTE — Telephone Encounter (Signed)
Called patient who states she doesn't have transportation for Tuesday appts. The bronch is supposed to be scheduled for 10/14 and not 10/15.Her appt desk says 10/15. Her f/u has been changed to 10/22 with SG.

## 2022-11-14 ENCOUNTER — Ambulatory Visit (HOSPITAL_COMMUNITY): Admission: RE | Admit: 2022-11-14 | Payer: Medicare Other | Source: Home / Self Care | Admitting: Emergency Medicine

## 2022-11-14 ENCOUNTER — Encounter (HOSPITAL_COMMUNITY): Admission: RE | Payer: Self-pay | Source: Home / Self Care

## 2022-11-14 DIAGNOSIS — R911 Solitary pulmonary nodule: Secondary | ICD-10-CM | POA: Insufficient documentation

## 2022-11-14 SURGERY — BRONCHOSCOPY, WITH BIOPSY USING ELECTROMAGNETIC NAVIGATION
Anesthesia: General | Laterality: Bilateral

## 2022-11-18 ENCOUNTER — Encounter (HOSPITAL_COMMUNITY): Payer: Self-pay | Admitting: Emergency Medicine

## 2022-11-18 NOTE — Progress Notes (Signed)
Anesthesia Chart Review: Maury Dus  Case: [id number] Date/Time: 11/21/22 1215   Procedure: ROBOTIC ASSISTED NAVIGATIONAL BRONCHOSCOPY (Bilateral)   Anesthesia type: General   Diagnosis: Lung nodule [R91.1]   Pre-op diagnosis: lung nodule   [facility] / [facility]   Surgeons: Leslye Peer, MD       DISCUSSION: : Patient is a 72 year old female scheduled for the above procedure. She was referred to pulmonology after LCS CT showed a LUL enlarging pulmonary nodule. Per notes, "She has had a subsequent nuclear medicine PET scan and case was reviewed at thoracic oncology conference. 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 Elige Radon Icard recommended "urgent bronchoscopy and biopsy." Of note, Dr. Tonia Brooms initially reached out to cardiologist Dr. Hoyle Barr office since she is on Plavix; however, she was only on Plavix for one month for iliac stent in 2014. She is otherwise not followed by cardiology. More recently, it appears she is on Plavix more for cerebrovascular disease which is followed by IR Dr. Corliss Skains with stable CTA findings in February 2024, and last intervention in 2011. Due to scheduling issues, case was posted for Dr. Delton Coombes. I have communicated with him regarding preoperative recommendations. He has reviewed and recommended patient hold Plavix starting 11/18/22. Patient reported she held Plavix on her own starting 11/12/22 with last ASA 11/18/22.    Other history includes smoking, HTN, HLD, carotid artery disease (1-39% BICA 07/2019), left leg DVT, PAD (left EIA stent 10/04/12), Barrett's esophagus, GERD, IBS, prediabetes, hypothyroidism, brain aneurysm (s/p stent assisted coiling left ICA aneurysm 01/28/10).   She is followed by IR Dr. Julieanne Cotton for cerebral aneurysm. Last CTA head/neck was on 03/22/22. She had stable findings of previously left paraclinoid ICA aneurysm coil  embolization, unchanged moderate right cavernous ICA stenosis and mild to moderate left V4 segment stenosis, bilateral carotid bifurcations calcification without hemodynamically significant stenosis. She in on Plavix.   Echo on 06/25/20 showed LVEF 65-70%, no regional wall motion abnormalities, mild concentric LVH, grade 1 diastolic dysfunction, normal RV systolic function, normal PASP, RVSP 20.8 mmHg, trivial MR, 37 mm ascending aorta.   She denied shortness of breath, cough, fever, chest pain per PAT RN phone interview.  She is a same-day work-up so further evaluation by her anesthesia team on the day of surgery. Labs and EKG on arrival as indicated.     VS:  BP Readings from Last 3 Encounters:  10/31/22 138/62  06/30/21 (!) 154/72  12/03/20 115/71   Pulse Readings from Last 3 Encounters:  10/31/22 (!) 58  06/30/21 75  12/03/20 65     PROVIDERS: Aliene Beams, MD is PCP  Julieanne Cotton, MD is IR  -She is not currently followed by cardiology, but saw Lance Muss, MD in 2014 for PAD, s/p left EIA stent 10/04/12.    LABS: Most recent lab results in [facility] include: Lab Results  Component Value Date   WBC 5.6 12/03/2020   HGB 16.3 (H) 12/03/2020   HCT 48.0 (H) 12/03/2020   PLT 307 12/03/2020   GLUCOSE 72 12/03/2020   ALT 17 09/17/2016   AST 25 09/17/2016   NA 144 12/03/2020   K 3.9 12/03/2020   CL 107 12/03/2020   CREATININE 1.20 (H) 03/22/2022   BUN 12 12/03/2020   CO2 27 06/04/2020   INR 1.0 12/10/2018     IMAGES: CT Super D Chest 11/07/22: IMPRESSION: - Stable left  upper lobe nodule, highly suspicious for primary bronchogenic carcinoma. - No evidence of metastatic disease in the chest. - Aortic Atherosclerosis (ICD10-I70.0) and Emphysema (ICD10-J43.9).  PET Scan 10/21/22: IMPRESSION: 1. Previously noted aggressive appearing left upper lobe pulmonary nodule is grossly similar to the prior examination and demonstrates hypermetabolism, highly concerning  for probable primary bronchogenic adenocarcinoma. There is also hypermetabolism in the left hilar region, presumably a metastatic lymph node. No distal metastatic disease noted elsewhere in the neck, abdomen or pelvis. 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 2. Aortic atherosclerosis, in addition to left main and three-vessel coronary artery disease. Please note that although the presence of coronary artery calcium documents the presence of coronary artery disease, the severity of this disease and any potential stenosis cannot be assessed on this non-gated CT examination. Assessment for potential risk factor modification, dietary therapy or pharmacologic therapy may be warranted, if clinically indicated. 3. Mild centrilobular and paraseptal emphysema.  CT Head/CTA Head & Neck 03/22/22: IMPRESSION: 1. Stable postprocedural changes reflecting stent assisted coil embolization of the left paraclinoid ICA aneurysm without definite residual or recurrent aneurysm. 2. Unchanged moderate stenosis of the right cavernous ICA and mild-to-moderate stenosis of the left V4 segment. 3. Calcified plaque at the carotid bifurcations, left worse than right, without hemodynamically significant stenosis or occlusion. Unchanged moderate to severe stenosis of the left V1 segment. 4. Stable noncontrast head CT with no acute intracranial pathology. 5. Emphysema.  CT Abd/pelvis 09/18/16: IMPRESSION: 1. No acute process identified. 2. Stable mild sigmoid colon diverticulosis without evidence for diverticulitis. 3. 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    EKG: EKG 06/04/20: Normal sinus rhythm Cannot rule out Anterior infarct , age undetermined Abnormal ECG No significant change since last tracing Confirmed by Alvira Monday ([id number]) on 06/04/2020 5:06:09 PM   CV: Echo 06/25/20: IMPRESSIONS   1.  Left ventricular ejection fraction, by estimation, is 65 to 70%. Left  ventricular ejection fraction by 3D volume is 74 %. The left ventricle has  normal function. The left ventricle has no regional wall motion  abnormalities. There is mild concentric  left ventricular hypertrophy. Left ventricular diastolic parameters are  consistent with Grade I diastolic dysfunction (impaired relaxation).   2. Right ventricular systolic function is normal. The right ventricular  size is normal. There is normal pulmonary artery systolic pressure. The  estimated right ventricular systolic pressure is 20.8 mmHg.   3. The mitral valve is normal in structure. Trivial mitral valve  regurgitation. No evidence of mitral stenosis.   4. The aortic valve is tricuspid. Aortic valve regurgitation is not  visualized. No aortic stenosis is present.   5. Aortic dilatation noted. There is mild dilatation of the ascending  aorta, measuring 37 mm.   6. The inferior vena cava is normal in size with greater than 50%  respiratory variability, suggesting right atrial pressure of 3 mmHg.    Carotid US 08/02/19: Summary:  - Right Carotid: Velocities in the right ICA are consistent with a 1-39% stenosis.  - Left Carotid: Velocities in the left ICA are consistent with a 1-39% stenosis. Prior study (06/17/19) shows 40-59%% stenosis, possibly overestimated due to vessel tourtuosity in the ICA.  - Vertebrals: Bilateral vertebral arteries demonstrate antegrade flow.     BLE Arterial Doppler/ABI 06/17/19: Summary:  - Right: Resting right ankle-brachial index is within normal range. No  evidence of significant right lower extremity arterial  disease. The right  toe-brachial index is normal. RT great toe pressure = 116 mmHg.  - Left: Resting left ankle-brachial index is within normal range. No  evidence of significant left lower extremity arterial disease. The left  toe-brachial index is normal. LT Great toe pressure = 113  mmHg.   Past Medical History:  Diagnosis Date   Adenomatous colon polyp    Aneurysm (HCC)    brain x 2   Anxiety    Arthritis    Atherosclerosis    Barrett's esophagus    Carotid artery disease (HCC)    Cataracts, bilateral    just watching   DDD (degenerative disc disease), lumbar    Depression    Diabetes mellitus without complication (HCC)    Diverticulosis    DVT (deep venous thrombosis) (HCC)    left leg   Fall at home 05/30/2014   Pt slipped in tub- hurt left hip and right shoulder   GERD (gastroesophageal reflux disease)    H. pylori infection    Hyperlipidemia    Hypertension    Hypothyroidism    IBS (irritable bowel syndrome)    Osteoarthritis    Poor sleep 04/28/2020   Pre-diabetes    no meds, diet controlled   RLS (restless legs syndrome)    Tubular adenoma of colon    Ulcer of the stomach and intestine     Past Surgical History:  Procedure Laterality Date   ANAL RECTAL MANOMETRY N/A 05/04/2016   Procedure: ANO RECTAL MANOMETRY;  Surgeon: Napoleon Form, MD;  Location: [facility];  Service: Endoscopy;  Laterality: N/A;   ANEURYSM COILING  July 26, 2010   stent   BRAIN SURGERY     Per patient " Left side behind eyes   INSERTION OF ILIAC STENT  10/04/2012   Procedure: INSERTION OF ILIAC STENT;  Surgeon: Corky Crafts, MD;  Location: [facility] CATH LAB;  Service: Cardiovascular;;  Left External Iliac Artery   IR ANGIO INTRA EXTRACRAN SEL COM CAROTID INNOMINATE BILAT MOD SED  11/23/2018   IR ANGIO INTRA EXTRACRAN SEL COM CAROTID INNOMINATE BILAT MOD SED  12/03/2020   IR ANGIO VERTEBRAL SEL SUBCLAVIAN INNOMINATE UNI L MOD SED  11/23/2018   IR ANGIO VERTEBRAL SEL VERTEBRAL BILAT MOD SED  12/03/2020   IR ANGIO VERTEBRAL SEL VERTEBRAL UNI L MOD SED  12/10/2018   IR ANGIO VERTEBRAL SEL VERTEBRAL UNI R MOD SED  11/23/2018   IR GENERIC HISTORICAL  11/03/2015   IR ANGIO VERTEBRAL SEL VERTEBRAL UNI L MOD SED 11/03/2015 Julieanne Cotton, MD [facility]   IR  GENERIC HISTORICAL  11/03/2015   IR ANGIO INTRA EXTRACRAN SEL COM CAROTID INNOMINATE BILAT MOD SED 11/03/2015 Julieanne Cotton, MD [facility]   IR GENERIC HISTORICAL  11/03/2015   IR ANGIO VERTEBRAL SEL SUBCLAVIAN INNOMINATE UNI R MOD SED 11/03/2015 Julieanne Cotton, MD [facility]   IR GENERIC HISTORICAL  11/19/2015   IR RADIOLOGIST EVAL & MGMT 11/19/2015 [facility]   IR RADIOLOGIST EVAL & MGMT  02/15/2022   IR US GUIDE VASC ACCESS RIGHT  11/23/2018   IR US GUIDE VASC ACCESS RIGHT  12/03/2020   LEG SURGERY     LOWER EXTREMITY ANGIOGRAM Left 10-04-12   and Left iliac stent   LOWER EXTREMITY ANGIOGRAM N/A 10/04/2012   Procedure: LOWER EXTREMITY ANGIOGRAM;  Surgeon: Corky Crafts, MD;  Location: [facility] CATH LAB;  Service: Cardiovascular;  Laterality: N/A;   RADIOLOGY WITH ANESTHESIA N/A 12/10/2018   Procedure: STENTING;  Surgeon: Julieanne Cotton, MD;  Location: [facility] OR;  Service: Radiology;  Laterality: N/A;   TUBAL LIGATION      MEDICATIONS: No current facility-administered medications for this encounter.    acetaminophen (TYLENOL) 500 MG tablet   amLODipine (NORVASC) 10 MG tablet   aspirin EC 81 MG tablet   atorvastatin (LIPITOR) 80 MG tablet   buPROPion (WELLBUTRIN SR) 150 MG 12 hr tablet   Calcium-Vitamin D (CALTRATE 600 PLUS-VIT D PO)   Cholecalciferol (VITAMIN D3) 10 MCG (400 UNIT) CAPS   clopidogrel (PLAVIX) 75 MG tablet   diclofenac Sodium (VOLTAREN) 1 % GEL   levothyroxine (SYNTHROID, LEVOTHROID) 25 MCG tablet   losartan (COZAAR) 50 MG tablet   Magnesium 200 MG TABS   Multiple Vitamin (MULTIVITAMIN WITH MINERALS) TABS tablet   ondansetron (ZOFRAN) 8 MG tablet   pantoprazole (PROTONIX) 40 MG tablet   Probiotic Product (PROBIOTIC PO)   Psyllium (METAMUCIL) 28.3 % POWD   Tetrahydrozoline HCl (VISINE OP)   vitamin B-12 (CYANOCOBALAMIN) 1000 MCG tablet   vitamin C (ASCORBIC ACID) 500 MG tablet    Shonna Chock, PA-C Surgical Short Stay/Anesthesiology [facility] Phone  [phone] [facility] Phone [phone] 11/18/2022 12:06 PM

## 2022-11-18 NOTE — Anesthesia Preprocedure Evaluation (Addendum)
Anesthesia Evaluation  Patient identified by MRN, date of birth, ID band Patient awake    Reviewed: Allergy & Precautions, NPO status , Patient's Chart, lab work & pertinent test results  Airway Mallampati: II  TM Distance: >3 FB Neck ROM: Full    Dental   Pulmonary Current Smoker and Patient abstained from smoking.   breath sounds clear to auscultation       Cardiovascular hypertension,  Rhythm:Regular Rate:Normal     Neuro/Psych negative neurological ROS     GI/Hepatic Neg liver ROS,GERD  ,,  Endo/Other  diabetesHypothyroidism    Renal/GU negative Renal ROS     Musculoskeletal  (+) Arthritis ,    Abdominal   Peds  Hematology negative hematology ROS (+)   Anesthesia Other Findings   Reproductive/Obstetrics                             Anesthesia Physical Anesthesia Plan  ASA: 3  Anesthesia Plan: General   Post-op Pain Management:    Induction: Intravenous  PONV Risk Score and Plan: 2 and Dexamethasone and Ondansetron  Airway Management Planned: Oral ETT  Additional Equipment:   Intra-op Plan:   Post-operative Plan: Extubation in OR  Informed Consent: I have reviewed the patients History and Physical, chart, labs and discussed the procedure including the risks, benefits and alternatives for the proposed anesthesia with the patient or authorized representative who has indicated his/her understanding and acceptance.       Plan Discussed with:   Anesthesia Plan Comments: (PAT note written 11/18/2022 by Shonna Chock, PA-C.  )       Anesthesia Quick Evaluation

## 2022-11-18 NOTE — Telephone Encounter (Signed)
I will update the requesting office that our scheduling team has reached out to the pt to schedule a new pt appt as pt last seen 2014. Pt said no one told her and is refusing to schedule a new pt appt.

## 2022-11-18 NOTE — Progress Notes (Signed)
SDW call  Patient was given pre-op instructions over the phone. Patient verbalized understanding of instructions provided.     PCP - Dr. Aliene Beams Cardiologist - denies Pulmonary:    PPM/ICD - denies Device Orders - na Rep Notified - na   Chest x-ray - na EKG -  DOS, 11/21/2022 Stress Test - ECHO -  Cardiac Cath -   Sleep Study/sleep apnea/CPAP: denies  Type II diabetic, diet controlled Fasting Blood sugar range: 103-110 How often check sugars: twice a week   Blood Thinner Instructions: Plavix, states her last dose was 11/12/2022 Aspirin Instructions:last dose 11/18/2022,    ERAS Protcol - NPO   COVID TEST- na   Anesthesia review: Yes. HTN, DM, DVT, high cholesterol, brain aneurysm, no cardiology clearance   Patient denies shortness of breath, fever, cough and chest pain over the phone call  Your procedure is scheduled on Monday November 21, 2022  Report to [facility] Main Entrance "A" at 0945 A.M., then check in with the Admitting office.  Call this number if you have problems the morning of surgery:  [phone]   If you have any questions prior to your surgery date call [phone]: Open Monday-Friday 8am-4pm If you experience any cold or flu symptoms such as cough, fever, chills, shortness of breath, etc. between now and your scheduled surgery, please notify us at the above number     Remember:  Do not eat or drink after midnight the night before your surgery  Take these medicines the morning of surgery with A SIP OF WATER:  Amlodipine, wellbutrin, protonix  As needed: Tylenol, zofran, visine  As of today, STOP taking any Aspirin (unless otherwise instructed by your surgeon) Aleve, Naproxen, Ibuprofen, Motrin, Advil, Goody's, BC's, all herbal medications, fish oil, and all vitamins. This includes your Voltaren

## 2022-11-21 ENCOUNTER — Other Ambulatory Visit: Payer: Self-pay

## 2022-11-21 ENCOUNTER — Ambulatory Visit (HOSPITAL_COMMUNITY): Payer: Medicare Other

## 2022-11-21 ENCOUNTER — Ambulatory Visit (HOSPITAL_BASED_OUTPATIENT_CLINIC_OR_DEPARTMENT_OTHER): Payer: Medicare Other | Admitting: Vascular Surgery

## 2022-11-21 ENCOUNTER — Ambulatory Visit: Payer: Medicare Other | Admitting: Cardiology

## 2022-11-21 ENCOUNTER — Encounter (HOSPITAL_COMMUNITY)
Admission: RE | Disposition: A | Discharge status: Home / Self Care | Payer: Self-pay | Source: Home / Self Care | Attending: Emergency Medicine

## 2022-11-21 ENCOUNTER — Ambulatory Visit (HOSPITAL_COMMUNITY): Payer: Medicare Other | Admitting: Vascular Surgery

## 2022-11-21 ENCOUNTER — Encounter (HOSPITAL_COMMUNITY): Payer: Self-pay | Admitting: Emergency Medicine

## 2022-11-21 ENCOUNTER — Ambulatory Visit (HOSPITAL_COMMUNITY)
Admission: RE | Admit: 2022-11-21 | Discharge: 2022-11-21 | Disposition: A | Discharge status: Home / Self Care | Payer: Medicare Other | Attending: Emergency Medicine | Admitting: Emergency Medicine

## 2022-11-21 DIAGNOSIS — F17210 Nicotine dependence, cigarettes, uncomplicated: Secondary | ICD-10-CM | POA: Insufficient documentation

## 2022-11-21 DIAGNOSIS — K219 Gastro-esophageal reflux disease without esophagitis: Secondary | ICD-10-CM | POA: Diagnosis not present

## 2022-11-21 DIAGNOSIS — I700 Atherosclerosis of aorta: Secondary | ICD-10-CM | POA: Diagnosis not present

## 2022-11-21 DIAGNOSIS — E039 Hypothyroidism, unspecified: Secondary | ICD-10-CM | POA: Insufficient documentation

## 2022-11-21 DIAGNOSIS — J984 Other disorders of lung: Secondary | ICD-10-CM | POA: Insufficient documentation

## 2022-11-21 DIAGNOSIS — E785 Hyperlipidemia, unspecified: Secondary | ICD-10-CM | POA: Diagnosis not present

## 2022-11-21 DIAGNOSIS — I6523 Occlusion and stenosis of bilateral carotid arteries: Secondary | ICD-10-CM | POA: Insufficient documentation

## 2022-11-21 DIAGNOSIS — Z7902 Long term (current) use of antithrombotics/antiplatelets: Secondary | ICD-10-CM | POA: Insufficient documentation

## 2022-11-21 DIAGNOSIS — I2510 Atherosclerotic heart disease of native coronary artery without angina pectoris: Secondary | ICD-10-CM | POA: Diagnosis not present

## 2022-11-21 DIAGNOSIS — I10 Essential (primary) hypertension: Secondary | ICD-10-CM | POA: Diagnosis not present

## 2022-11-21 DIAGNOSIS — R911 Solitary pulmonary nodule: Secondary | ICD-10-CM | POA: Diagnosis not present

## 2022-11-21 DIAGNOSIS — J432 Centrilobular emphysema: Secondary | ICD-10-CM | POA: Diagnosis not present

## 2022-11-21 DIAGNOSIS — C3412 Malignant neoplasm of upper lobe, left bronchus or lung: Secondary | ICD-10-CM | POA: Insufficient documentation

## 2022-11-21 DIAGNOSIS — Z8719 Personal history of other diseases of the digestive system: Secondary | ICD-10-CM | POA: Diagnosis not present

## 2022-11-21 DIAGNOSIS — Z48813 Encounter for surgical aftercare following surgery on the respiratory system: Secondary | ICD-10-CM | POA: Diagnosis not present

## 2022-11-21 DIAGNOSIS — R918 Other nonspecific abnormal finding of lung field: Secondary | ICD-10-CM | POA: Diagnosis not present

## 2022-11-21 DIAGNOSIS — R846 Abnormal cytological findings in specimens from respiratory organs and thorax: Secondary | ICD-10-CM | POA: Diagnosis not present

## 2022-11-21 DIAGNOSIS — K589 Irritable bowel syndrome without diarrhea: Secondary | ICD-10-CM | POA: Insufficient documentation

## 2022-11-21 HISTORY — PX: BRONCHIAL BIOPSY: SHX5109

## 2022-11-21 HISTORY — DX: Disorder of arteries and arterioles, unspecified: I77.9

## 2022-11-21 HISTORY — PX: BRONCHIAL WASHINGS: SHX5105

## 2022-11-21 HISTORY — PX: BRONCHIAL BRUSHINGS: SHX5108

## 2022-11-21 HISTORY — DX: Type 2 diabetes mellitus without complications: E11.9

## 2022-11-21 HISTORY — PX: BRONCHIAL NEEDLE ASPIRATION BIOPSY: SHX5106

## 2022-11-21 HISTORY — PX: FIDUCIAL MARKER PLACEMENT: SHX6858

## 2022-11-21 LAB — POCT I-STAT, CHEM 8
BUN: 19 mg/dL (ref 8–23)
Calcium, Ion: 1.15 mmol/L (ref 1.15–1.40)
Chloride: 108 mmol/L (ref 98–111)
Creatinine, Ser: 1.1 mg/dL — ABNORMAL HIGH (ref 0.44–1.00)
Glucose, Bld: 105 mg/dL — ABNORMAL HIGH (ref 70–99)
HCT: 46 % (ref 36.0–46.0)
Hemoglobin: 15.6 g/dL — ABNORMAL HIGH (ref 12.0–15.0)
Potassium: 3.7 mmol/L (ref 3.5–5.1)
Sodium: 143 mmol/L (ref 135–145)
TCO2: 23 mmol/L (ref 22–32)

## 2022-11-21 LAB — GLUCOSE, CAPILLARY
Glucose-Capillary: 115 mg/dL — ABNORMAL HIGH (ref 70–99)
Glucose-Capillary: 96 mg/dL (ref 70–99)

## 2022-11-21 SURGERY — BRONCHOSCOPY, WITH BIOPSY USING ELECTROMAGNETIC NAVIGATION
Anesthesia: General | Laterality: Bilateral

## 2022-11-21 MED ORDER — PROMETHAZINE HCL 25 MG/ML IJ SOLN: 6.2500 mg | Freq: Once | INTRAMUSCULAR | Status: DC

## 2022-11-21 MED ORDER — LIDOCAINE 2% (20 MG/ML) 5 ML SYRINGE
INTRAMUSCULAR | Status: DC | PRN
  Administered 2022-11-21: 60 mg via INTRAVENOUS

## 2022-11-21 MED ORDER — CLOPIDOGREL BISULFATE 75 MG PO TABS: 75.0000 mg | ORAL_TABLET | Freq: Every day | ORAL | Status: AC

## 2022-11-21 MED ORDER — FENTANYL CITRATE (PF) 100 MCG/2ML IJ SOLN: 25.0000 ug | INTRAMUSCULAR | Status: DC | PRN

## 2022-11-21 MED ORDER — PHENYLEPHRINE HCL-NACL 20-0.9 MG/250ML-% IV SOLN
INTRAVENOUS | Status: DC | PRN
  Administered 2022-11-21: 15 ug/min via INTRAVENOUS

## 2022-11-21 MED ORDER — PROMETHAZINE HCL 25 MG/ML IJ SOLN
INTRAMUSCULAR | Status: AC
  Filled 2022-11-21: qty 1

## 2022-11-21 MED ORDER — SODIUM CHLORIDE 0.9 % IV SOLN: INTRAVENOUS | Status: DC

## 2022-11-21 MED ORDER — CHLORHEXIDINE GLUCONATE 0.12 % MT SOLN
OROMUCOSAL | Status: AC
  Administered 2022-11-21: 15 mL via OROMUCOSAL
  Filled 2022-11-21: qty 15

## 2022-11-21 MED ORDER — PROMETHAZINE (PHENERGAN) 6.25MG IN NS 50ML IVPB
6.2500 mg | Freq: Once | INTRAVENOUS | Status: DC
  Filled 2022-11-21: qty 50

## 2022-11-21 MED ORDER — FENTANYL CITRATE (PF) 100 MCG/2ML IJ SOLN
INTRAMUSCULAR | Status: DC | PRN
Start: 2022-11-21 — End: 2022-11-21
  Administered 2022-11-21 (×2): 50 ug via INTRAVENOUS

## 2022-11-21 MED ORDER — PROPOFOL 500 MG/50ML IV EMUL
INTRAVENOUS | Status: DC | PRN
Start: 2022-11-21 — End: 2022-11-21
  Administered 2022-11-21: 100 ug/kg/min via INTRAVENOUS

## 2022-11-21 MED ORDER — AMISULPRIDE (ANTIEMETIC) 5 MG/2ML IV SOLN
INTRAVENOUS | Status: AC
  Filled 2022-11-21: qty 4

## 2022-11-21 MED ORDER — ONDANSETRON HCL 4 MG/2ML IJ SOLN
INTRAMUSCULAR | Status: DC | PRN
  Administered 2022-11-21: 4 mg via INTRAVENOUS

## 2022-11-21 MED ORDER — PROPOFOL 10 MG/ML IV BOLUS
INTRAVENOUS | Status: DC | PRN
  Administered 2022-11-21: 30 mg via INTRAVENOUS
  Administered 2022-11-21: 80 mg via INTRAVENOUS

## 2022-11-21 MED ORDER — CHLORHEXIDINE GLUCONATE 0.12 % MT SOLN: 15.0000 mL | Freq: Once | OROMUCOSAL | Status: AC

## 2022-11-21 MED ORDER — DEXAMETHASONE SODIUM PHOSPHATE 10 MG/ML IJ SOLN
INTRAMUSCULAR | Status: DC | PRN
  Administered 2022-11-21: 5 mg via INTRAVENOUS

## 2022-11-21 MED ORDER — SUGAMMADEX SODIUM 200 MG/2ML IV SOLN
INTRAVENOUS | Status: DC | PRN
  Administered 2022-11-21: 200 mg via INTRAVENOUS

## 2022-11-21 MED ORDER — AMISULPRIDE (ANTIEMETIC) 5 MG/2ML IV SOLN
10.0000 mg | Freq: Once | INTRAVENOUS | Status: AC | PRN
  Administered 2022-11-21: 10 mg via INTRAVENOUS

## 2022-11-21 MED ORDER — ACETAMINOPHEN 500 MG PO TABS
1000.0000 mg | ORAL_TABLET | Freq: Once | ORAL | Status: AC
  Administered 2022-11-21: 1000 mg via ORAL
  Filled 2022-11-21: qty 2

## 2022-11-21 MED ORDER — ROCURONIUM BROMIDE 10 MG/ML (PF) SYRINGE
PREFILLED_SYRINGE | INTRAVENOUS | Status: DC | PRN
  Administered 2022-11-21: 40 mg via INTRAVENOUS

## 2022-11-21 SURGICAL SUPPLY — 1 items: superlock fiducial marker IMPLANT

## 2022-11-21 NOTE — Anesthesia Procedure Notes (Signed)
Procedure Name: Intubation Date/Time: 11/21/2022 11:06 AM  Performed by: Nils Pyle, CRNAPre-anesthesia Checklist: Patient identified, Emergency Drugs available, Suction available and Patient being monitored Patient Re-evaluated:Patient Re-evaluated prior to induction Oxygen Delivery Method: Circle System Utilized Preoxygenation: Pre-oxygenation with 100% oxygen Induction Type: IV induction Ventilation: Mask ventilation without difficulty and Oral airway inserted - appropriate to patient size Laryngoscope Size: Hyacinth Meeker and 2 Grade View: Grade I Tube type: Oral Tube size: 8.5 mm Number of attempts: 1 Airway Equipment and Method: Stylet and Oral airway Placement Confirmation: ETT inserted through vocal cords under direct vision, positive ETCO2 and breath sounds checked- equal and bilateral Secured at: 21 cm Tube secured with: Tape Dental Injury: Teeth and Oropharynx as per pre-operative assessment

## 2022-11-21 NOTE — Op Note (Signed)
Video Bronchoscopy with Robotic Assisted Bronchoscopic Navigation   Date of Operation: 11/21/2022   Pre-op Diagnosis: Left upper lobe pulmonary nodule  Post-op Diagnosis: Left upper lobe pulmonary nodule  Surgeon: Levy Pupa  Assistants: None  Anesthesia: General endotracheal anesthesia  Operation: Flexible video fiberoptic bronchoscopy with robotic assistance and biopsies.  Estimated Blood Loss: Minimal  Complications: None  Indications and History: Vicki Page is a 72 y.o. female with history of Tobacco use, Barrett's esophagus, CAD, DVT.  She has a mixed density left upper lobe pulmonary nodule that is more prominent on serial imaging, hypermetabolic on PET scan.  Recommendation made to achieve a tissue diagnosis via robotic assisted navigational bronchoscopy.  The risks, benefits, complications, treatment options and expected outcomes were discussed with the patient.  The possibilities of pneumothorax, pneumonia, reaction to medication, pulmonary aspiration, perforation of a viscus, bleeding, failure to diagnose a condition and creating a complication requiring transfusion or operation were discussed with the patient who freely signed the consent.    Description of Procedure: The patient was seen in the Preoperative Area, was examined and was deemed appropriate to proceed.  The patient was taken to [facility] endoscopy room 3, identified as [name] and the procedure verified as Flexible Video Fiberoptic Bronchoscopy.  A Time Out was held and the above information confirmed.   Prior to the date of the procedure a high-resolution CT scan of the chest was performed. Utilizing ION software program a virtual tracheobronchial tree was generated to allow the creation of distinct navigation pathways to the patient's parenchymal abnormalities. After being taken to the operating room general anesthesia was initiated and the patient  was orally intubated. The video fiberoptic bronchoscope was  introduced via the endotracheal tube and a general inspection was performed which showed normal right and left lung anatomy. Aspiration of the bilateral mainstems was completed to remove any remaining secretions. Robotic catheter inserted into patient's endotracheal tube.   Target #1 left upper lobe mixed density pulmonary nodule: The distinct navigation pathways prepared prior to this procedure were then utilized to navigate to patient's lesion identified on CT scan. The robotic catheter was secured into place and the vision probe was withdrawn.  Lesion location was approximated using fluoroscopy.  Local registration and targeting was performed using Cios three-dimensional imaging. Under fluoroscopic guidance transbronchial needle brushings, transbronchial needle biopsies, and transbronchial forceps biopsies were performed to be sent for cytology and pathology. A bronchioalveolar lavage was performed in the left upper lobe adjacent to the nodule and sent for cytology.  Finally under fluoroscopic guidance a single fiducial marker was placed adjacent to the nodule.   At the end of the procedure a general airway inspection was performed and there was no evidence of active bleeding. The bronchoscope was removed.  The patient tolerated the procedure well. There was no significant blood loss and there were no obvious complications. A post-procedural chest x-ray is pending.  Samples Target #1: 1. Transbronchial needle brushings from left upper lobe pulmonary nodule 2. Transbronchial Wang needle biopsies from left upper lobe pulmonary nodule 3. Transbronchial forceps biopsies from left upper lobe pulmonary nodule 4. Bronchoalveolar lavage from left upper lobe   Plans:  The patient will be discharged from the PACU to home when recovered from anesthesia and after chest x-ray is reviewed. We will review the cytology, pathology and microbiology results with the patient when they become available. Outpatient  followup will be with Dr. Delton Coombes or Dr. Tonia Brooms.    Levy Pupa, MD, PhD  11/21/2022, 11:57 AM [facility] [phone] or if no answer before 7:00PM call [phone] For any issues after 7:00PM please call eLink [phone]

## 2022-11-21 NOTE — Research (Signed)
Title: A multi-center, prospective, single-arm, observational study to evaluate real-world outcomes for the shape-sensing Ion endoluminal system  Primary Outcome: Evaluate procedure characteristics and short and long-term patient outcomes following shape-sensing robotic-assisted bronchoscopy (ssRAB) utilizing the Ion Endoluminal System for lung lesion localization or biopsy.   Protocol # / Study Name: ISI-ION-003 Clinical Trials #: [id number] Sponsor: [employer] Principal Investigator: Dr. Elige Radon Icard   Key Features of Ion Endoluminal System (referred to as "Ion") Ion is the first FDA cleared bronchoscopy system that uses fiber optic shape sensing technology to inform on location within the airways. Its catheter/tool channel has a smaller outer diameter (3.5 mm) in comparison to conventional bronchoscopes, allowing it to navigate into the smaller airways of the periphery.     Key Inclusion Criteria Subject is 18 years or older at the time of the procedure Subject is a candidate for a planned, elective RAB lung lesion localization or biopsy procedure in which the Ion Endoluminal System is planned to be utilized.  Subject  able to understand and adhere to study requirements and provide informed consent.   Key Exclusion Criteria Subject is under the care of a Museum/gallery exhibitions officer and is unable to provide informed consent on their own accord.  Subject is participating in an interventional research study or research study with investigational agents with an unknown safety profile that would interfere with participation or the results of this study.  Female subjects who are pregnant or nursing at the time of the index Ion procedure, as determined by standard site practices. Subjects that are incarcerated or institutionalized under court order, or other vulnerable populations.    Previous Clinical Trials Since receiving FDA clearance in Feb 2019, Ion has been adopted  commercially by over 226 centers in the [place], and utilized in over 40,000 procedures.  The first in-human study enrolled 28 subjects with a mean lesion size of 14.8 mm and the overall diagnostic yield was 79.3%, with no adverse events. 17 (58.6%) lesions were reported to have a bronchus sign available on CT imaging.  A multi-center study published results in 2022, with 270 lesions biopsied in 241 patients using Ion. The mean largest cardinal lesion size was 18.86.66mm, and the mean airway generation count was 7.01.6. Asymptomatic pneumothorax occurred in 3.3% of subjects, and 0.8% experienced airway bleeding.   Another study provided preliminary results in 2022, with 87% sensitivity for malignancy, a diagnostic yield of 81%, and a mean lesion size of 16 mm. 75% of biopsy cases were bronchus-sign negative. 4% of subjects experienced pneumothoraces (including those requiring intervention), and 0.8% of subjects experienced airway bleeding requiring wedging or balloon tamponade.  A single-center study captured 131 consecutive procedures of pulmonary biopsy using Ion. The navigational success rate was 98.7%, with an overall diagnostic yield of 81.7%, an overall complication rate of 3%, and a pneumothorax rate of 1.5%.    [facility] Clinical Research Coordinator note:   This visit for Subject Vicki Page with DOB: 1950-04-25 on 11/21/2022 for the above protocol is Visit/Encounter # Pre-procedure, Intra-procedure and Post-procedure, and is for purpose of research.   The consent for this encounter is under:  Protocol Version 1.0 Investigator Brochure Version N/A Consent Version Revision A, dated 14Nov2023 and is currently IRB approved.   [name] expressed continued interest and consent in continuing as a study subject. Subject confirmed that there was no change in contact information (e.g. address, telephone, email). Subject thanked for participation in research and contribution to  science.  In this visit 11/21/2022 the subject will be evaluated by Sub-Investigator named Dr. Delton Coombes. This research coordinator has verified that the above investigator is up to date with his/her training logs.   The Subject was informed that the PI continues to have oversight of the subject's visits and course through relevant discussions, reviews, and also specifically of this visit by routing of this note to the PI.  The research study was discussed with the subject in the pre-operative room. The study was explained in detail including all the contents of the informed consent document. The subject was encouraged to ask questions. All questions were answered to their satisfaction. The IRB approved informed consent was signed, and a copy was given to the subject. After obtaining consent, the subject underwent scheduled procedure using the ion endoluminal system. Data collection was completed per protocol. Refer to paper source subject binder for further details.      Signed by  Verdene Lennert Clinical Research Coordinator / Sub-Investigator  [facility]  [place], [place] 11:54 AM 11/21/2022

## 2022-11-21 NOTE — Telephone Encounter (Signed)
I will speak to her about setting up an office visit with cardiology, principally for risk stratification.  I will put her back on her Plavix for her peripheral vascular disease post bronchoscopy

## 2022-11-21 NOTE — Progress Notes (Signed)
Per MD Byrum, patient ok to discharge home before reading room reads xray.

## 2022-11-21 NOTE — Discharge Instructions (Addendum)
Flexible Bronchoscopy, Care After This sheet gives you information about how to care for yourself after your test. Your doctor may also give you more specific instructions. If you have problems or questions, contact your doctor. Follow these instructions at home: Eating and drinking When your numbness is gone and your cough and gag reflexes have come back, you may: Eat only soft foods. Slowly drink liquids. The day after the test, go back to your normal diet. Driving Do not drive for 24 hours if you were given a medicine to help you relax (sedative). Do not drive or use heavy machinery while taking prescription pain medicine. General instructions  Take over-the-counter and prescription medicines only as told by your doctor. Return to your normal activities as told. Ask what activities are safe for you. Do not use any products that have nicotine or tobacco in them. This includes cigarettes and e-cigarettes. If you need help quitting, ask your doctor. Keep all follow-up visits as told by your doctor. This is important. It is very important if you had a tissue sample (biopsy) taken. Get help right away if: You have shortness of breath that gets worse. You get light-headed. You feel like you are going to pass out (faint). You have chest pain. You cough up: More than a little blood. More blood than before. Summary Do not eat or drink anything (not even water) for 2 hours after your test, or until your numbing medicine wears off. Do not use cigarettes. Do not use e-cigarettes. Get help right away if you have chest pain.  Please call our office for any questions or concerns.  [phone]. Okay to restart your Plavix on 11/22/2022.  This information is not intended to replace advice given to you by your health care provider. Make sure you discuss any questions you have with your health care provider. Document Released: 11/21/2008 Document Revised: 01/06/2017 Document Reviewed:  02/12/2016 Elsevier Patient Education  2020 [employer].

## 2022-11-21 NOTE — Transfer of Care (Signed)
Immediate Anesthesia Transfer of Care Note  Patient: Vicki Page  Procedure(s) Performed: ROBOTIC ASSISTED NAVIGATIONAL BRONCHOSCOPY (Bilateral) BRONCHIAL BIOPSIES BRONCHIAL NEEDLE ASPIRATION BIOPSIES BRONCHIAL BRUSHINGS FIDUCIAL MARKER PLACEMENT BRONCHIAL WASHINGS  Patient Location: PACU  Anesthesia Type:General  Level of Consciousness: awake and alert   Airway & Oxygen Therapy: Patient Spontanous Breathing and Patient connected to face mask oxygen  Post-op Assessment: Report given to RN, Post -op Vital signs reviewed and stable, and Patient moving all extremities X 4  Post vital signs: Reviewed and stable  Last Vitals:  Vitals Value Taken Time  BP 123/60   Temp    Pulse 72   Resp 18   SpO2 98     Last Pain:  Vitals:   11/21/22 1025  TempSrc:   PainSc: 0-No pain      Patients Stated Pain Goal: 0 (11/21/22 1025)  Complications: No notable events documented.

## 2022-11-21 NOTE — Anesthesia Postprocedure Evaluation (Signed)
Anesthesia Post Note  Patient: Vicki Page  Procedure(s) Performed: ROBOTIC ASSISTED NAVIGATIONAL BRONCHOSCOPY (Bilateral) BRONCHIAL BIOPSIES BRONCHIAL NEEDLE ASPIRATION BIOPSIES BRONCHIAL BRUSHINGS FIDUCIAL MARKER PLACEMENT BRONCHIAL WASHINGS     Patient location during evaluation: PACU Anesthesia Type: General Level of consciousness: awake and alert Pain management: pain level controlled Vital Signs Assessment: post-procedure vital signs reviewed and stable Respiratory status: spontaneous breathing, nonlabored ventilation, respiratory function stable and patient connected to nasal cannula oxygen Cardiovascular status: blood pressure returned to baseline and stable Postop Assessment: no apparent nausea or vomiting Anesthetic complications: no  No notable events documented.  Last Vitals:  Vitals:   11/21/22 1330 11/21/22 1345  BP: (!) 140/65 (!) 126/58  Pulse: 74 77  Resp: 11 16  Temp:  36.6 C  SpO2: 96% 94%    Last Pain:  Vitals:   11/21/22 1205  TempSrc:   PainSc: 0-No pain                 Kennieth Rad

## 2022-11-21 NOTE — Interval H&P Note (Signed)
History and Physical Interval Note:  11/21/2022 10:25 AM  Vicki Page  has presented today for surgery, with the diagnosis of lung nodule.  The various methods of treatment have been discussed with the patient and family. After consideration of risks, benefits and other options for treatment, the patient has consented to  Procedure(s): ROBOTIC ASSISTED NAVIGATIONAL BRONCHOSCOPY (Bilateral) as a surgical intervention.  The patient's history has been reviewed, patient examined, no change in status, stable for surgery.  I have reviewed the patient's chart and labs.  Questions were answered to the patient's satisfaction.     Leslye Peer

## 2022-11-22 ENCOUNTER — Encounter (HOSPITAL_COMMUNITY): Payer: Self-pay | Admitting: Emergency Medicine

## 2022-11-22 ENCOUNTER — Ambulatory Visit: Payer: Medicare Other | Admitting: Acute Care

## 2022-11-24 LAB — CYTOLOGY - NON PAP

## 2022-11-25 ENCOUNTER — Telehealth: Payer: Self-pay | Admitting: Emergency Medicine

## 2022-11-25 ENCOUNTER — Ambulatory Visit
Admission: RE | Admit: 2022-11-25 | Discharge: 2022-11-25 | Disposition: A | Discharge status: Home / Self Care | Payer: Medicare Other | Source: Ambulatory Visit | Attending: Family Medicine

## 2022-11-25 DIAGNOSIS — Z1231 Encounter for screening mammogram for malignant neoplasm of breast: Secondary | ICD-10-CM | POA: Diagnosis not present

## 2022-11-25 NOTE — Telephone Encounter (Signed)
Discussed bronchoscopy results with the patient by phone.  Biopsy shows squamous cell lung cancer.  She has an appointment with Saralyn Pilar on 10/22 to discuss appropriate/desired referral for treatment

## 2022-11-29 ENCOUNTER — Encounter: Payer: Self-pay | Admitting: Acute Care

## 2022-11-29 ENCOUNTER — Ambulatory Visit (INDEPENDENT_AMBULATORY_CARE_PROVIDER_SITE_OTHER): Payer: Medicare Other | Admitting: Acute Care

## 2022-11-29 VITALS — BP 134/84 | HR 84 | Ht 61.5 in | Wt 140.4 lb

## 2022-11-29 DIAGNOSIS — C3490 Malignant neoplasm of unspecified part of unspecified bronchus or lung: Secondary | ICD-10-CM | POA: Diagnosis not present

## 2022-11-29 DIAGNOSIS — F17210 Nicotine dependence, cigarettes, uncomplicated: Secondary | ICD-10-CM | POA: Diagnosis not present

## 2022-11-29 DIAGNOSIS — F17200 Nicotine dependence, unspecified, uncomplicated: Secondary | ICD-10-CM

## 2022-11-29 NOTE — Progress Notes (Signed)
Ct completed prior to bronch   Thanks,  BLI  Josephine Igo, DO [facility] 11/29/2022 3:23 PM

## 2022-11-29 NOTE — Progress Notes (Signed)
History of Present Illness Vicki Page is a 72 y.o. female  current every day smoker followed through the lung cancer screening program. She was found to have a LUL pulmonary nodule that needed further evaluation.    Synopsis 72 year old female, past medical history of Barrett's esophagus, carotid artery disease, gastroesophageal reflux, DVT, hypertension, hyperlipidemia. Patient was referred for evaluation after lung cancer screening CT was complete. Lung cancer screening CT revealed a aggressive appearing left upper lobe pulmonary nodule that had been enlarging. Patient's initial CT scan was then August 2023. The nodule itself was not documented in the August 2023 scanned addendum was made. Case has been discussed with Dr. Llana Page. She has had a subsequent nuclear medicine PET scan and case was reviewed at thoracic oncology conference. Findings were consistent with a T1 cN0 MX disease stage IIb with evidence of hypermetabolism in the left hilar region.She was referred to see Dr. Tonia Page   10/31/2022, and she was scheduled for a Flexible video fiberoptic bronchoscopy with robotic assistance and biopsies on 11/21/2022 by Dr. Delton Page.  She is here today to see how she is doing post procedure and to review cytology results.   11/29/2022 Pt. Presents for follow up after bronchoscopy with biopsies. She states he has done well after the procedure. She denies any fever , bleeding , she denies any discolored  secretions.  She did have nausea and vomiting after the procedure that most likely was anesthesia related. She states that this has self resolved.   We have discussed her biopsy cytology.  Her Left upper lobe nodule was positive for squamous cell lung cancer. We will refer her to medical oncology . She is very angry and she is very stressed because her 6 year old daughter had early onset dementia, and social workers have been unable to find her a care facility. Plan at present is for patient's daughter  to come home with Vicki Page. I have explained with her new diagnosis , it is doubtful she will be able to manage her daughter's care. She is going to reach back out to social services to see if they can help find placement for her daughter.   I have referred Vicki Page to medical oncology for further care. She will most likely benefit from social services assistance through the cancer center.   Test Results: Cytology FINAL MICROSCOPIC DIAGNOSIS:  A. LUNG, LUL, NEEDLE ASPIRATIONS  BIOPSY FORCEPS:  - Squamous cell carcinoma   B. LUNG, LUL, BRUSHING:  - No malignant cells identified  - Blood   COMMENT:   Immunohistochemical stains were preformed to characterize the tumor  cells. The cells are positive for p40 and are negative for TTF-1. The  finding is in keeping with a squamous cell carcinoma.   Controls worked appropriately.   The case was peer-reviewed by Dr. Venetia Page who agrees with the  diagnosis.  C. LUNG, LUL, LAVAGE:    FINAL MICROSCOPIC DIAGNOSIS:  - No malignant cells identified    Nuclear medicine pet imaging 10/21/2022: Patient was found to have a hypermetabolic lesion in the left upper lobe concerning for malignancy.  As well as hypermetabolism within the left hilum concerning for T1 cN1 MX disease.     Latest Ref Rng & Units 11/21/2022   10:39 AM 12/03/2020    1:09 PM 12/03/2020   11:07 AM  CBC  WBC 4.0 - 10.5 K/uL   5.6   Hemoglobin 12.0 - 15.0 g/dL 29.5  28.4  13.2   Hematocrit 36.0 -  46.0 % 46.0  48.0  46.2   Platelets 150 - 400 K/uL   307        Latest Ref Rng & Units 11/21/2022   10:39 AM 03/22/2022    4:44 PM 12/03/2020    1:09 PM  BMP  Glucose 70 - 99 mg/dL 409   72   BUN 8 - 23 mg/dL 19   12   Creatinine 8.11 - 1.00 mg/dL 9.14  7.82  9.56   Sodium 135 - 145 mmol/L 143   144   Potassium 3.5 - 5.1 mmol/L 3.7   3.9   Chloride 98 - 111 mmol/L 108   107     BNP No results found for: "BNP"  ProBNP No results found for: "PROBNP"  PFT No  results found for: "FEV1PRE", "FEV1POST", "FVCPRE", "FVCPOST", "TLC", "DLCOUNC", "PREFEV1FVCRT", "PSTFEV1FVCRT"  DG Chest Port 1 View  Result Date: 11/21/2022 CLINICAL DATA:  Status post bronchoscopy with biopsy. EXAM: PORTABLE CHEST 1 VIEW COMPARISON:  June 04, 2020. FINDINGS: The heart size and mediastinal contours are within normal limits. No pneumothorax is noted. Biopsy clip is seen in left midlung area. Mild opacity is noted around biopsy clip suggesting focal atelectasis or possibly small contusion related to biopsy. The visualized skeletal structures are unremarkable. IMPRESSION: No pneumothorax is noted. Mild opacity is noted around biopsy clip in left midlung suggesting focal atelectasis or possibly small contusion. Electronically Signed   By: Lupita Raider M.D.   On: 11/21/2022 14:25   DG C-ARM BRONCHOSCOPY  Result Date: 11/21/2022 C-ARM BRONCHOSCOPY: Fluoroscopy was utilized by the requesting physician.  No radiographic interpretation.   CT Super D Chest Wo Contrast  Result Date: 11/18/2022 CLINICAL DATA:  Follow-up pulmonary nodule EXAM: CT CHEST WITHOUT CONTRAST TECHNIQUE: Multidetector CT imaging of the chest was performed using thin slice collimation for electromagnetic bronchoscopy planning purposes, without intravenous contrast. RADIATION DOSE REDUCTION: This exam was performed according to the departmental dose-optimization program which includes automated exposure control, adjustment of the mA and/or kV according to patient size and/or use of iterative reconstruction technique. COMPARISON:  PET-CT dated 10/21/2022 FINDINGS: Cardiovascular: The heart is normal in size. No pericardial effusion. No evidence of thoracic aortic aneurysm. Atherosclerotic calcifications of the aortic arch. Moderate three-vessel coronary atherosclerosis. Enlargement the main pulmonary artery, suggesting pulmonary arterial hypertension. Mediastinum/Nodes: Small mediastinal nodes, including an 8 mm short  axis prevascular node (series 3/image 45), within normal limits. Visualized thyroid is unremarkable. Lungs/Pleura: 2.2 x 1.1 cm spiculated mixed cystic/solid nodule in the anterior left upper lobe (series 4/image 54), grossly unchanged, highly suspicious for primary bronchogenic carcinoma. Additional 4 mm subpleural nodule in the left lower lobe (series 4/image 9), unchanged, benign. Moderate centrilobular and paraseptal emphysematous changes, upper lung predominant. No focal consolidation. No pleural effusion or pneumothorax. Upper Abdomen: Visualized upper abdomen is grossly unremarkable, noting vascular calcifications. Musculoskeletal: Degenerative changes of the visualized thoracolumbar spine. IMPRESSION: Stable left upper lobe nodule, highly suspicious for primary bronchogenic carcinoma. No evidence of metastatic disease in the chest. Aortic Atherosclerosis (ICD10-I70.0) and Emphysema (ICD10-J43.9). Electronically Signed   By: Charline Bills M.D.   On: 11/18/2022 00:05     Past medical hx Past Medical History:  Diagnosis Date   Adenomatous colon polyp    Aneurysm (HCC)    brain x 2   Anxiety    Arthritis    Atherosclerosis    Barrett's esophagus    Carotid artery disease (HCC)    Cataracts, bilateral    just watching  DDD (degenerative disc disease), lumbar    Depression    Diabetes mellitus without complication (HCC)    Diverticulosis    DVT (deep venous thrombosis) (HCC)    left leg   Fall at home 05/30/2014   Pt slipped in tub- hurt left hip and right shoulder   GERD (gastroesophageal reflux disease)    H. pylori infection    Hyperlipidemia    Hypertension    Hypothyroidism    IBS (irritable bowel syndrome)    Osteoarthritis    Poor sleep 04/28/2020   Pre-diabetes    no meds, diet controlled   RLS (restless legs syndrome)    Tubular adenoma of colon    Ulcer of the stomach and intestine      Social History   Tobacco Use   Smoking status: Every Day    Current  packs/day: 0.25    Types: Cigarettes   Smokeless tobacco: Never   Tobacco comments:    5 cigarettes smoked daily [facility]J 03/11/20  Vaping Use   Vaping status: Former  Substance Use Topics   Alcohol use: No    Alcohol/week: 0.0 standard drinks of alcohol   Drug use: No    VickiPolakowski reports that she has been smoking cigarettes. She has never used smokeless tobacco. She reports that she does not drink alcohol and does not use drugs.  Tobacco Cessation: Current every day smoker Smoking cessation counseling 3 minutes  Past surgical hx, Family hx, Social hx all reviewed.  Current Outpatient Medications on File Prior to Visit  Medication Sig   acetaminophen (TYLENOL) 500 MG tablet Take 1,000 mg by mouth 2 (two) times daily as needed for pain.    amLODipine (NORVASC) 10 MG tablet Take 10 mg by mouth daily. for high blood pressure   aspirin EC 81 MG tablet Take 81 mg by mouth daily.   buPROPion (WELLBUTRIN SR) 150 MG 12 hr tablet Take 150 mg by mouth 2 (two) times daily.   Calcium-Vitamin D (CALTRATE 600 PLUS-VIT D PO) Take 1 tablet by mouth daily before supper.    Cholecalciferol (VITAMIN D3) 10 MCG (400 UNIT) CAPS Take 400 Units by mouth daily.   clopidogrel (PLAVIX) 75 MG tablet Take 1 tablet (75 mg total) by mouth daily with breakfast. Okay to restart on 11/22/2022.   losartan (COZAAR) 50 MG tablet Take 75 mg by mouth daily.   Magnesium 200 MG TABS Take 200 mg by mouth daily.   Multiple Vitamin (MULTIVITAMIN WITH MINERALS) TABS tablet Take 1 tablet by mouth daily before supper. Centrum Silver   ondansetron (ZOFRAN) 8 MG tablet Take 8 mg by mouth every 8 (eight) hours as needed.   pantoprazole (PROTONIX) 40 MG tablet Take 1 tablet (40 mg total) by mouth daily.   Probiotic Product (PROBIOTIC PO) Take 1 tablet by mouth daily after lunch.    Psyllium (METAMUCIL) 28.3 % POWD Take 1 Dose by mouth daily. 2 teaspoons full = 1 dose   Tetrahydrozoline HCl (VISINE OP) Place 1 drop into both eyes daily  as needed (irritation/dry eyes).   vitamin B-12 (CYANOCOBALAMIN) 1000 MCG tablet Take 1,000 mcg by mouth daily.   vitamin C (ASCORBIC ACID) 500 MG tablet Take 500 mg by mouth daily.   No current facility-administered medications on file prior to visit.     Allergies  Allergen Reactions   Fluconazole     Mouth swelling and soreness    Ropinirole Nausea And Vomiting   Codeine Nausea And Vomiting   Darvon [Propoxyphene  Hcl] Nausea And Vomiting   Flagyl [Metronidazole] Nausea And Vomiting    Review Of Systems:  Constitutional:   No  weight loss, Page sweats,  Fevers, chills, fatigue, or  lassitude.  HEENT:   No headaches,  Difficulty swallowing,  Tooth/dental problems, or  Sore throat,                No sneezing, itching, ear ache, nasal congestion, post nasal drip,   CV:  No chest pain,  Orthopnea, PND, swelling in lower extremities, anasarca, dizziness, palpitations, syncope.   GI  No heartburn, indigestion, abdominal pain, nausea, vomiting, diarrhea, change in bowel habits, loss of appetite, bloody stools.   Resp: No shortness of breath with exertion or at rest.  No excess mucus, no productive cough,  No non-productive cough,  No coughing up of blood.  No change in color of mucus.  No wheezing.  No chest wall deformity  Skin: no rash or lesions.  GU: no dysuria, change in color of urine, no urgency or frequency.  No flank pain, no hematuria   MS:  No joint pain or swelling.  No decreased range of motion.  No back pain.  Psych:  No change in mood or affect. No depression or anxiety.  No memory loss. + angry   Vital Signs BP 134/84 (BP Location: Left Arm, Cuff Size: Normal)   Pulse 84   Ht 5' 1.5" (1.562 m)   Wt 140 lb 6.4 oz (63.7 kg)   LMP  (LMP Unknown)   SpO2 100% Comment: on RA  BMI 26.10 kg/m    Physical Exam:  General- No distress,  A&Ox3, frustrated and angry ENT: No sinus tenderness, TM clear, pale nasal mucosa, no oral exudate,no post nasal drip, no  LAN Cardiac: S1, S2, regular rate and rhythm, no murmur Chest: No wheeze/ rales/ dullness; no accessory muscle use, no nasal flaring, no sternal retractions Abd.: Soft Non-tender, ND, BS +, Body mass index is 26.1 kg/m.  Ext: No clubbing cyanosis, edema Neuro:  normal strength, MAE x 4, A&O x 3, appropriate Skin: No rashes, warm and dry, no lesions  Psych: normal mood and behavior, angry about her diagnosis and life stressors   Assessment/Plan New diagnosis squamous cell lung cancer left upper lobe Concern for hypermetabolism in the left hilar region, presumably a metastatic lymph node on PET Current every day smoker  Plan The biopsy of the left upper lobe was positive for squamous cell carcinoma. I have referred you to medical oncology.  You will get a call to get consultation scheduled.  You will have access to a social worker through the cancer center. Please let the Social Workers at the hospital know you cannot manage the care of your daughter while undergoing cancer treatment.  Call if you need [place]. Please contact office for sooner follow up if symptoms do not improve or worsen or seek emergency care    I spent 35 minutes dedicated to the care of this patient on the date of this encounter to include pre-visit review of records, face-to-face time with the patient discussing conditions above, post visit ordering of testing, clinical documentation with the electronic health record, making appropriate referrals as documented, and communicating necessary information to the patient's healthcare team.   Bevelyn Ngo, NP 11/29/2022  10:40 AM

## 2022-11-29 NOTE — Patient Instructions (Signed)
I am glad you have done well after your procedure. 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 I have referred you to medical oncology.  You will get a call to get consultation scheduled.  You will have access to a social worker through the cancer center. Please let the Social Workers at the hospital know you cannot manage the care of your daughter while undergoing cancer treatment.  Call if you need [place]. Please contact office for sooner follow up if symptoms do not improve or worsen or seek emergency care

## 2022-11-30 ENCOUNTER — Other Ambulatory Visit: Payer: Self-pay

## 2022-11-30 DIAGNOSIS — R911 Solitary pulmonary nodule: Secondary | ICD-10-CM

## 2022-12-01 ENCOUNTER — Other Ambulatory Visit: Payer: Self-pay

## 2022-12-01 NOTE — Progress Notes (Signed)
The proposed treatment discussed in conference is for discussion purpose only and is not a binding recommendation.  The patients have not been physically examined, or presented with their treatment options.  Therefore, final treatment plans cannot be decided.  

## 2022-12-02 DIAGNOSIS — Z23 Encounter for immunization: Secondary | ICD-10-CM | POA: Diagnosis not present

## 2022-12-02 DIAGNOSIS — E039 Hypothyroidism, unspecified: Secondary | ICD-10-CM | POA: Diagnosis not present

## 2022-12-02 DIAGNOSIS — N1831 Chronic kidney disease, stage 3a: Secondary | ICD-10-CM | POA: Diagnosis not present

## 2022-12-02 DIAGNOSIS — Z79899 Other long term (current) drug therapy: Secondary | ICD-10-CM | POA: Diagnosis not present

## 2022-12-02 DIAGNOSIS — E7800 Pure hypercholesterolemia, unspecified: Secondary | ICD-10-CM | POA: Diagnosis not present

## 2022-12-02 DIAGNOSIS — I700 Atherosclerosis of aorta: Secondary | ICD-10-CM | POA: Diagnosis not present

## 2022-12-02 DIAGNOSIS — C3412 Malignant neoplasm of upper lobe, left bronchus or lung: Secondary | ICD-10-CM | POA: Diagnosis not present

## 2022-12-02 DIAGNOSIS — E118 Type 2 diabetes mellitus with unspecified complications: Secondary | ICD-10-CM | POA: Diagnosis not present

## 2022-12-02 DIAGNOSIS — E1122 Type 2 diabetes mellitus with diabetic chronic kidney disease: Secondary | ICD-10-CM | POA: Diagnosis not present

## 2022-12-02 DIAGNOSIS — E1151 Type 2 diabetes mellitus with diabetic peripheral angiopathy without gangrene: Secondary | ICD-10-CM | POA: Diagnosis not present

## 2022-12-02 DIAGNOSIS — N183 Chronic kidney disease, stage 3 unspecified: Secondary | ICD-10-CM | POA: Diagnosis not present

## 2022-12-02 DIAGNOSIS — F17200 Nicotine dependence, unspecified, uncomplicated: Secondary | ICD-10-CM | POA: Diagnosis not present

## 2022-12-02 DIAGNOSIS — Z0000 Encounter for general adult medical examination without abnormal findings: Secondary | ICD-10-CM | POA: Diagnosis not present

## 2022-12-05 ENCOUNTER — Other Ambulatory Visit: Payer: Self-pay | Admitting: Family Medicine

## 2022-12-05 DIAGNOSIS — E2839 Other primary ovarian failure: Secondary | ICD-10-CM

## 2022-12-06 ENCOUNTER — Telehealth: Payer: Self-pay | Admitting: Internal Medicine

## 2022-12-06 NOTE — Telephone Encounter (Signed)
Rescheduled appointment per patients request due to family emergency. Patient is aware of the changes made to her upcoming appointments. Megan RN (lung navigator has been notified via secure chat).

## 2022-12-07 ENCOUNTER — Other Ambulatory Visit (HOSPITAL_COMMUNITY): Payer: Self-pay | Admitting: Interventional Radiology

## 2022-12-07 ENCOUNTER — Inpatient Hospital Stay: Payer: Medicare Other

## 2022-12-07 ENCOUNTER — Telehealth (HOSPITAL_COMMUNITY): Payer: Self-pay

## 2022-12-07 ENCOUNTER — Inpatient Hospital Stay: Payer: Medicare Other | Admitting: Internal Medicine

## 2022-12-07 DIAGNOSIS — I671 Cerebral aneurysm, nonruptured: Secondary | ICD-10-CM

## 2022-12-07 DIAGNOSIS — I771 Stricture of artery: Secondary | ICD-10-CM

## 2022-12-07 NOTE — Telephone Encounter (Signed)
Pt would like to wait and schedule at the beginning of December. She has a daughter in the hospital and a lot of other appts this month. AB

## 2022-12-08 ENCOUNTER — Ambulatory Visit: Payer: Medicare Other | Admitting: Radiation Oncology

## 2022-12-08 ENCOUNTER — Ambulatory Visit: Payer: Medicare Other

## 2022-12-09 DIAGNOSIS — E039 Hypothyroidism, unspecified: Secondary | ICD-10-CM | POA: Diagnosis not present

## 2022-12-12 ENCOUNTER — Encounter: Payer: Self-pay | Admitting: Acute Care

## 2022-12-15 ENCOUNTER — Inpatient Hospital Stay: Payer: Medicare Other | Attending: Internal Medicine

## 2022-12-15 ENCOUNTER — Inpatient Hospital Stay (HOSPITAL_BASED_OUTPATIENT_CLINIC_OR_DEPARTMENT_OTHER): Payer: Medicare Other | Admitting: Internal Medicine

## 2022-12-15 VITALS — BP 130/52 | HR 61 | Temp 98.6°F | Resp 17 | Ht 61.5 in | Wt 140.4 lb

## 2022-12-15 DIAGNOSIS — Z7902 Long term (current) use of antithrombotics/antiplatelets: Secondary | ICD-10-CM | POA: Diagnosis not present

## 2022-12-15 DIAGNOSIS — Z83438 Family history of other disorder of lipoprotein metabolism and other lipidemia: Secondary | ICD-10-CM | POA: Insufficient documentation

## 2022-12-15 DIAGNOSIS — Z885 Allergy status to narcotic agent status: Secondary | ICD-10-CM | POA: Diagnosis not present

## 2022-12-15 DIAGNOSIS — F0393 Unspecified dementia, unspecified severity, with mood disturbance: Secondary | ICD-10-CM | POA: Insufficient documentation

## 2022-12-15 DIAGNOSIS — Z823 Family history of stroke: Secondary | ICD-10-CM | POA: Insufficient documentation

## 2022-12-15 DIAGNOSIS — M1990 Unspecified osteoarthritis, unspecified site: Secondary | ICD-10-CM | POA: Insufficient documentation

## 2022-12-15 DIAGNOSIS — R5383 Other fatigue: Secondary | ICD-10-CM | POA: Diagnosis not present

## 2022-12-15 DIAGNOSIS — F0394 Unspecified dementia, unspecified severity, with anxiety: Secondary | ICD-10-CM | POA: Insufficient documentation

## 2022-12-15 DIAGNOSIS — Z83719 Family history of colon polyps, unspecified: Secondary | ICD-10-CM | POA: Diagnosis not present

## 2022-12-15 DIAGNOSIS — Z883 Allergy status to other anti-infective agents status: Secondary | ICD-10-CM | POA: Insufficient documentation

## 2022-12-15 DIAGNOSIS — R0609 Other forms of dyspnea: Secondary | ICD-10-CM | POA: Diagnosis not present

## 2022-12-15 DIAGNOSIS — Z825 Family history of asthma and other chronic lower respiratory diseases: Secondary | ICD-10-CM | POA: Diagnosis not present

## 2022-12-15 DIAGNOSIS — R590 Localized enlarged lymph nodes: Secondary | ICD-10-CM

## 2022-12-15 DIAGNOSIS — Z8249 Family history of ischemic heart disease and other diseases of the circulatory system: Secondary | ICD-10-CM

## 2022-12-15 DIAGNOSIS — Z86718 Personal history of other venous thrombosis and embolism: Secondary | ICD-10-CM | POA: Diagnosis not present

## 2022-12-15 DIAGNOSIS — Z79899 Other long term (current) drug therapy: Secondary | ICD-10-CM | POA: Insufficient documentation

## 2022-12-15 DIAGNOSIS — Z8719 Personal history of other diseases of the digestive system: Secondary | ICD-10-CM | POA: Insufficient documentation

## 2022-12-15 DIAGNOSIS — G2581 Restless legs syndrome: Secondary | ICD-10-CM | POA: Diagnosis not present

## 2022-12-15 DIAGNOSIS — C3412 Malignant neoplasm of upper lobe, left bronchus or lung: Secondary | ICD-10-CM

## 2022-12-15 DIAGNOSIS — E7800 Pure hypercholesterolemia, unspecified: Secondary | ICD-10-CM | POA: Diagnosis not present

## 2022-12-15 DIAGNOSIS — Z833 Family history of diabetes mellitus: Secondary | ICD-10-CM | POA: Insufficient documentation

## 2022-12-15 DIAGNOSIS — Z881 Allergy status to other antibiotic agents status: Secondary | ICD-10-CM | POA: Diagnosis not present

## 2022-12-15 DIAGNOSIS — Z860101 Personal history of adenomatous and serrated colon polyps: Secondary | ICD-10-CM | POA: Diagnosis not present

## 2022-12-15 DIAGNOSIS — E1136 Type 2 diabetes mellitus with diabetic cataract: Secondary | ICD-10-CM | POA: Diagnosis not present

## 2022-12-15 DIAGNOSIS — F319 Bipolar disorder, unspecified: Secondary | ICD-10-CM | POA: Diagnosis not present

## 2022-12-15 DIAGNOSIS — Z8349 Family history of other endocrine, nutritional and metabolic diseases: Secondary | ICD-10-CM | POA: Insufficient documentation

## 2022-12-15 DIAGNOSIS — K589 Irritable bowel syndrome without diarrhea: Secondary | ICD-10-CM | POA: Insufficient documentation

## 2022-12-15 DIAGNOSIS — R911 Solitary pulmonary nodule: Secondary | ICD-10-CM

## 2022-12-15 LAB — CMP (CANCER CENTER ONLY)
ALT: 11 U/L (ref 0–44)
AST: 17 U/L (ref 15–41)
Albumin: 3.7 g/dL (ref 3.5–5.0)
Alkaline Phosphatase: 93 U/L (ref 38–126)
Anion gap: 5 (ref 5–15)
BUN: 15 mg/dL (ref 8–23)
CO2: 27 mmol/L (ref 22–32)
Calcium: 9.1 mg/dL (ref 8.9–10.3)
Chloride: 110 mmol/L (ref 98–111)
Creatinine: 1.05 mg/dL — ABNORMAL HIGH (ref 0.44–1.00)
GFR, Estimated: 56 mL/min — ABNORMAL LOW (ref >60)
Glucose, Bld: 103 mg/dL — ABNORMAL HIGH (ref 70–99)
Potassium: 4.1 mmol/L (ref 3.5–5.1)
Sodium: 142 mmol/L (ref 135–145)
Total Bilirubin: 0.4 mg/dL (ref <1.2)
Total Protein: 6.8 g/dL (ref 6.5–8.1)

## 2022-12-15 LAB — CBC WITH DIFFERENTIAL (CANCER CENTER ONLY)
Abs Immature Granulocytes: 0.02 10*3/uL (ref 0.00–0.07)
Basophils Absolute: 0 10*3/uL (ref 0.0–0.1)
Basophils Relative: 1 %
Eosinophils Absolute: 0 10*3/uL (ref 0.0–0.5)
Eosinophils Relative: 0 %
HCT: 43.7 % (ref 36.0–46.0)
Hemoglobin: 14.5 g/dL (ref 12.0–15.0)
Immature Granulocytes: 0 %
Lymphocytes Relative: 29 %
Lymphs Abs: 1.5 10*3/uL (ref 0.7–4.0)
MCH: 28.1 pg (ref 26.0–34.0)
MCHC: 33.2 g/dL (ref 30.0–36.0)
MCV: 84.7 fL (ref 80.0–100.0)
Monocytes Absolute: 0.4 10*3/uL (ref 0.1–1.0)
Monocytes Relative: 7 %
Neutro Abs: 3.3 10*3/uL (ref 1.7–7.7)
Neutrophils Relative %: 63 %
Platelet Count: 287 10*3/uL (ref 150–400)
RBC: 5.16 MIL/uL — ABNORMAL HIGH (ref 3.87–5.11)
RDW: 15.1 % (ref 11.5–15.5)
WBC Count: 5.3 10*3/uL (ref 4.0–10.5)
nRBC: 0 % (ref 0.0–0.2)

## 2022-12-15 NOTE — Progress Notes (Signed)
[facility] [phone]   Fax:([phone]) [phone]  CONSULT NOTE  REFERRING PHYSICIAN: Dr. Levy Pupa  REASON FOR CONSULTATION:  72 years old African-American female recently diagnosed with lung cancer  HPI Vicki Page is a 72 y.o. female came to the clinic today for initial evaluation accompanied by her daughter [name]. Discussed the use of AI scribe software for clinical note transcription with the patient, who gave verbal consent to proceed.  History of Present Illness   The patient, a 72 year old with a history of smoking, was recently diagnosed with lung cancer. The diagnosis was made following a CT screening due to her smoking history. The patient reports a delay in the communication of the diagnosis, with the initial CT screening performed in February of the previous year, but the diagnosis only communicated in August of the current year.  The patient's lung cancer was identified as an enlarging nodule in the left upper lobe, confirmed by a PET scan. The PET scan also revealed a lymph node in the left lobe. The patient was staged as T1CN1, corresponding to stage 2B. A bronchoscopy and biopsy of the nodule confirmed the diagnosis of squamous cell carcinoma of the lung.  Physically, the patient reports feeling well, with recent onset of shortness of breath, particularly when walking long distances. There are no reports of chest pain, nausea, vomiting, diarrhea, weight loss, headaches, or changes in vision.  The patient's medical history includes arthritis, anemia, carotid artery stenosis, diabetes, high blood pressure, high cholesterol, irritable bowel syndrome, and restless leg syndrome. There is no history of heart attacks or strokes. The patient's family history includes a mother who died from a stroke and a father who died from a heart attack. There is no reported history of cancer in the family.  The patient has a 30-year history of smoking, starting in  her early 20s following the death of her mother. The patient reports smoking less than a pack a day and has recently quit smoking following the cancer diagnosis. There is no reported alcohol or drug use.  The patient's social history includes being divorced with two daughters. She previously worked in Education officer, community. The patient's current concerns include the potential side effects of chemotherapy and the impact of her illness on her ability to care for her other daughter, who has early dementia and bipolar disorder.       Past Medical History:  Diagnosis Date   Adenomatous colon polyp    Aneurysm (HCC)    brain x 2   Anxiety    Arthritis    Atherosclerosis    Barrett's esophagus    Carotid artery disease (HCC)    Cataracts, bilateral    just watching   DDD (degenerative disc disease), lumbar    Depression    Diabetes mellitus without complication (HCC)    Diverticulosis    DVT (deep venous thrombosis) (HCC)    left leg   Fall at home 05/30/2014   Pt slipped in tub- hurt left hip and right shoulder   GERD (gastroesophageal reflux disease)    H. pylori infection    Hyperlipidemia    Hypertension    Hypothyroidism    IBS (irritable bowel syndrome)    Osteoarthritis    Poor sleep 04/28/2020   Pre-diabetes    no meds, diet controlled   RLS (restless legs syndrome)    Tubular adenoma of colon    Ulcer of the stomach and intestine  Past Surgical History:  Procedure Laterality Date   ANAL RECTAL MANOMETRY N/A 05/04/2016   Procedure: ANO RECTAL MANOMETRY;  Surgeon: Napoleon Form, MD;  Location: [facility] ENDOSCOPY;  Service: Endoscopy;  Laterality: N/A;   ANEURYSM COILING  01/28/2010   stent-assisted coiling LICA aneurysm   BRAIN SURGERY     Per patient " Left side behind eyes   BRONCHIAL BIOPSY  11/21/2022   Procedure: BRONCHIAL BIOPSIES;  Surgeon: Leslye Peer, MD;  Location: [facility] ENDOSCOPY;  Service: Pulmonary;;   BRONCHIAL BRUSHINGS  11/21/2022    Procedure: BRONCHIAL BRUSHINGS;  Surgeon: Leslye Peer, MD;  Location: [facility] ENDOSCOPY;  Service: Pulmonary;;   BRONCHIAL NEEDLE ASPIRATION BIOPSY  11/21/2022   Procedure: BRONCHIAL NEEDLE ASPIRATION BIOPSIES;  Surgeon: Leslye Peer, MD;  Location: [facility] ENDOSCOPY;  Service: Pulmonary;;   BRONCHIAL WASHINGS  11/21/2022   Procedure: BRONCHIAL WASHINGS;  Surgeon: Leslye Peer, MD;  Location: [facility] ENDOSCOPY;  Service: Pulmonary;;   FIDUCIAL MARKER PLACEMENT  11/21/2022   Procedure: FIDUCIAL MARKER PLACEMENT;  Surgeon: Leslye Peer, MD;  Location: [facility] ENDOSCOPY;  Service: Pulmonary;;   INSERTION OF ILIAC STENT  10/04/2012   Procedure: INSERTION OF ILIAC STENT;  Surgeon: Corky Crafts, MD;  Location: [facility] CATH LAB;  Service: Cardiovascular;;  Left External Iliac Artery   IR ANGIO INTRA EXTRACRAN SEL COM CAROTID INNOMINATE BILAT MOD SED  11/23/2018   IR ANGIO INTRA EXTRACRAN SEL COM CAROTID INNOMINATE BILAT MOD SED  12/03/2020   IR ANGIO VERTEBRAL SEL SUBCLAVIAN INNOMINATE UNI L MOD SED  11/23/2018   IR ANGIO VERTEBRAL SEL VERTEBRAL BILAT MOD SED  12/03/2020   IR ANGIO VERTEBRAL SEL VERTEBRAL UNI L MOD SED  12/10/2018   IR ANGIO VERTEBRAL SEL VERTEBRAL UNI R MOD SED  11/23/2018   IR GENERIC HISTORICAL  11/03/2015   IR ANGIO VERTEBRAL SEL VERTEBRAL UNI L MOD SED 11/03/2015 Julieanne Cotton, MD [facility]   IR GENERIC HISTORICAL  11/03/2015   IR ANGIO INTRA EXTRACRAN SEL COM CAROTID INNOMINATE BILAT MOD SED 11/03/2015 Julieanne Cotton, MD [facility]   IR GENERIC HISTORICAL  11/03/2015   IR ANGIO VERTEBRAL SEL SUBCLAVIAN INNOMINATE UNI R MOD SED 11/03/2015 Julieanne Cotton, MD [facility]   IR GENERIC HISTORICAL  11/19/2015   IR RADIOLOGIST EVAL & MGMT 11/19/2015 [facility]   IR RADIOLOGIST EVAL & MGMT  02/15/2022   IR US GUIDE VASC ACCESS RIGHT  11/23/2018   IR US GUIDE VASC ACCESS RIGHT  12/03/2020   LEG SURGERY     LOWER EXTREMITY ANGIOGRAM Left 10/04/2012   and Left iliac  stent   LOWER EXTREMITY ANGIOGRAM N/A 10/04/2012   Procedure: LOWER EXTREMITY ANGIOGRAM;  Surgeon: Corky Crafts, MD;  Location: [facility] CATH LAB;  Service: Cardiovascular;  Laterality: N/A;   RADIOLOGY WITH ANESTHESIA N/A 12/10/2018   Procedure: STENTING;  Surgeon: Julieanne Cotton, MD;  Location: [facility] OR;  Service: Radiology;  Laterality: N/A;   TUBAL LIGATION      Family History  Problem Relation Age of Onset   Deep vein thrombosis Mother    Diabetes Mother        Amputation   Heart disease Mother        Heart Disease before age 2   Hyperlipidemia Mother    Hypertension Mother    Heart attack Mother    Stroke Mother    Thyroid disease Mother    Heart disease Father        Heart Disease before age 40  Hypertension Father    Heart attack Father    Hyperlipidemia Father    Heart disease Brother        Heart Disease before age 49   Hyperlipidemia Brother    Hypertension Brother    Heart attack Brother    COPD Brother    Diabetes Brother    Colonic polyp Brother    Colon cancer Neg Hx    Stomach cancer Neg Hx     Social History Social History   Tobacco Use   Smoking status: Every Day    Current packs/day: 0.25    Types: Cigarettes   Smokeless tobacco: Never   Tobacco comments:    5 cigarettes smoked daily ARJ 03/11/20  Vaping Use   Vaping status: Former  Substance Use Topics   Alcohol use: No    Alcohol/week: 0.0 standard drinks of alcohol   Drug use: No    Allergies  Allergen Reactions   Fluconazole     Mouth swelling and soreness    Ropinirole Nausea And Vomiting   Codeine Nausea And Vomiting   Darvon [Propoxyphene Hcl] Nausea And Vomiting   Flagyl [Metronidazole] Nausea And Vomiting    Current Outpatient Medications  Medication Sig Dispense Refill   acetaminophen (TYLENOL) 500 MG tablet Take 1,000 mg by mouth 2 (two) times daily as needed for pain.      amLODipine (NORVASC) 10 MG tablet Take 10 mg by mouth daily. for high blood pressure      aspirin EC 81 MG tablet Take 81 mg by mouth daily.     buPROPion (WELLBUTRIN SR) 150 MG 12 hr tablet Take 150 mg by mouth 2 (two) times daily.     Calcium-Vitamin D (CALTRATE 600 PLUS-VIT D PO) Take 1 tablet by mouth daily before supper.      Cholecalciferol (VITAMIN D3) 10 MCG (400 UNIT) CAPS Take 400 Units by mouth daily.     clopidogrel (PLAVIX) 75 MG tablet Take 1 tablet (75 mg total) by mouth daily with breakfast. Okay to restart on 11/22/2022.     losartan (COZAAR) 50 MG tablet Take 75 mg by mouth daily.     Magnesium 200 MG TABS Take 200 mg by mouth daily.     Multiple Vitamin (MULTIVITAMIN WITH MINERALS) TABS tablet Take 1 tablet by mouth daily before supper. Centrum Silver     ondansetron (ZOFRAN) 8 MG tablet Take 8 mg by mouth every 8 (eight) hours as needed.     pantoprazole (PROTONIX) 40 MG tablet Take 1 tablet (40 mg total) by mouth daily. 30 tablet 2   Probiotic Product (PROBIOTIC PO) Take 1 tablet by mouth daily after lunch.      Psyllium (METAMUCIL) 28.3 % POWD Take 1 Dose by mouth daily. 2 teaspoons full = 1 dose     Tetrahydrozoline HCl (VISINE OP) Place 1 drop into both eyes daily as needed (irritation/dry eyes).     vitamin B-12 (CYANOCOBALAMIN) 1000 MCG tablet Take 1,000 mcg by mouth daily.     vitamin C (ASCORBIC ACID) 500 MG tablet Take 500 mg by mouth daily.     No current facility-administered medications for this visit.    Review of Systems  Constitutional: positive for fatigue Eyes: negative Ears, nose, mouth, throat, and face: negative Respiratory: positive for dyspnea on exertion Cardiovascular: negative Gastrointestinal: negative Genitourinary:negative Integument/breast: negative Hematologic/lymphatic: negative Musculoskeletal:negative Neurological: negative Behavioral/Psych: negative Endocrine: negative Allergic/Immunologic: negative  Physical Exam  WUJ:WJXBJ, healthy, no distress, well nourished, well developed, and anxious SKIN:  skin color,  texture, turgor are normal, no rashes or significant lesions HEAD: Normocephalic, No masses, lesions, tenderness or abnormalities EYES: normal, PERRLA, Conjunctiva are pink and non-injected EARS: External ears normal, Canals clear OROPHARYNX:no exudate, no erythema, and lips, buccal mucosa, and tongue normal  NECK: supple, no adenopathy LYMPH:  no palpable lymphadenopathy, no hepatosplenomegaly BREAST:not examined LUNGS: clear to auscultation , and palpation HEART: regular rate & rhythm, no murmurs, and no gallops ABDOMEN:abdomen soft, non-tender, normal bowel sounds, and no masses or organomegaly BACK: Back symmetric, no curvature., No CVA tenderness EXTREMITIES:no joint deformities, effusion, or inflammation, no edema  NEURO: alert & oriented x 3 with fluent speech, no focal motor/sensory deficits  PERFORMANCE STATUS: ECOG 1  LABORATORY DATA: Lab Results  Component Value Date   WBC 5.3 12/15/2022   HGB 14.5 12/15/2022   HCT 43.7 12/15/2022   MCV 84.7 12/15/2022   PLT 287 12/15/2022      Chemistry      Component Value Date/Time   NA 143 11/21/2022 1039   K 3.7 11/21/2022 1039   CL 108 11/21/2022 1039   CO2 27 06/04/2020 1449   BUN 19 11/21/2022 1039   CREATININE 1.10 (H) 11/21/2022 1039      Component Value Date/Time   CALCIUM 9.2 06/04/2020 1449   ALKPHOS 81 09/17/2016 2329   AST 25 09/17/2016 2329   ALT 17 09/17/2016 2329   BILITOT 0.8 09/17/2016 2329       RADIOGRAPHIC STUDIES: MM 3D SCREENING MAMMOGRAM BILATERAL BREAST  Result Date: 11/29/2022 CLINICAL DATA:  Screening. EXAM: DIGITAL SCREENING BILATERAL MAMMOGRAM WITH TOMOSYNTHESIS AND CAD TECHNIQUE: Bilateral screening digital craniocaudal and mediolateral oblique mammograms were obtained. Bilateral screening digital breast tomosynthesis was performed. The images were evaluated with computer-aided detection. COMPARISON:  Previous exam(s). ACR Breast Density Category c: The breasts are heterogeneously dense,  which may obscure small masses. FINDINGS: There are no findings suspicious for malignancy. IMPRESSION: No mammographic evidence of malignancy. A result letter of this screening mammogram will be mailed directly to the patient. RECOMMENDATION: Screening mammogram in one year. (Code:SM-B-01Y) BI-RADS CATEGORY  1: Negative. Electronically Signed   By: Edwin Cap M.D.   On: 11/29/2022 11:16   DG Chest Port 1 View  Result Date: 11/21/2022 CLINICAL DATA:  Status post bronchoscopy with biopsy. EXAM: PORTABLE CHEST 1 VIEW COMPARISON:  June 04, 2020. FINDINGS: The heart size and mediastinal contours are within normal limits. No pneumothorax is noted. Biopsy clip is seen in left midlung area. Mild opacity is noted around biopsy clip suggesting focal atelectasis or possibly small contusion related to biopsy. The visualized skeletal structures are unremarkable. IMPRESSION: No pneumothorax is noted. Mild opacity is noted around biopsy clip in left midlung suggesting focal atelectasis or possibly small contusion. Electronically Signed   By: Lupita Raider M.D.   On: 11/21/2022 14:25   DG C-ARM BRONCHOSCOPY  Result Date: 11/21/2022 C-ARM BRONCHOSCOPY: Fluoroscopy was utilized by the requesting physician.  No radiographic interpretation.    ASSESSMENT: This is a very pleasant 72 years old African-American female with recently diagnosed stage IIb (T1c, N1, M0) non-small cell lung cancer, squamous cell carcinoma diagnosed in April 2024 and presented with left upper lobe lung nodule in addition to left hilar lymphadenopathy.   PLAN: I had a lengthy discussion with the patient and her daughter today about her current disease stage, prognosis and treatment options. I personally and independently reviewed the scan images and discussed the results with the patient and her daughter. Assessment and  Plan    Lung Cancer (Stage IIB, Squamous Cell Carcinoma) Diagnosed with squamous cell carcinoma of the lung, stage  IIB, in the left upper lobe. 30-year smoking history. Initial CT in February 2023, follow-up PET in August 2024 showed a 2.3x1.4 cm nodule with lymph node involvement. Bronchoscopy and biopsy confirmed diagnosis. Discussed treatment options: surgery, chemotherapy, and radiation. Emphasized need for pulmonary function test to determine surgical eligibility. If surgery is not feasible, mild chemotherapy and radiation will be considered. Modern chemotherapy has fewer side effects. Routine brain MRI to rule out metastasis. - Order pulmonary function test - Refer to thoracic surgeon for surgical evaluation - Order brain MRI to rule out metastasis - Plan for mild chemotherapy and radiation if surgery is not feasible  General Health Maintenance Discussed smoking cessation. Patient quit smoking three weeks ago. No alcohol or drug use. No recent weight loss, nausea, vomiting, vision changes, or headaches. No history of myocardial infarction or cerebrovascular accident. Family history: stroke in mother, myocardial infarction in father. No family history of cancer. - Encourage continued smoking cessation  Follow-up - Schedule pulmonary function test within a week - Schedule brain MRI within a week - Follow-up with thoracic surgeon within two weeks - Return to discuss treatment plan based on surgical evaluation and test results.    The patient voices understanding of current disease status and treatment options and is in agreement with the current care plan.  All questions were answered. The patient knows to call the clinic with any problems, questions or concerns. We can certainly see the patient much sooner if necessary.  Thank you so much for allowing me to participate in the care of [name]. I will continue to follow up the patient with you and assist in her care.  The total time spent in the appointment was 60 minutes.  Disclaimer: This note was dictated with voice recognition software.  Similar sounding words can inadvertently be transcribed and may not be corrected upon review.   Lajuana Matte December 15, 2022, 2:44 PM

## 2022-12-16 NOTE — Progress Notes (Signed)
Location of tumor and Histology per Pathology Report: Lung nodule  Biopsy:     Past/Anticipated interventions by surgeon, if any: {t:21944} ***  Past/Anticipated interventions by medical oncology, if any: Chemotherapy ***    Pain issues, if any:  {:18581} {PAIN DESCRIPTION:21022940}  SAFETY ISSUES: Prior radiation? {:18581} Pacemaker/ICD? {:18581} Possible current pregnancy? no Is the patient on methotrexate? {:18581}  Current Complaints / other details:  ***     ***

## 2022-12-16 NOTE — Progress Notes (Signed)
I met the pt face to face today at her medical oncology consult with Dr.Mohamed. Pt was accompanied by her daughter, Lafonda Mosses.  The plan for the pt is to have PFTs done as well as a CT surgery consult. If pt is deemed NAC for surgery, a referral to Radiation Oncology can be considered as well as a discussion for possible chemotherapy.  I provided the pt with a Lung Cancer Journey Book and my business card with my direct number on it.  I let the pt know I will alert CT surgery and our Respiratory Therapists to schedule her for PFTs prior to her consult with CT surgery.  Pt will need transportation from time to time. I have reached out to [employer], broadcasting via email.  No questions verbalized by the pt or her daughter. Pt will follow up after her consult with CT surgery.

## 2022-12-17 DIAGNOSIS — C3412 Malignant neoplasm of upper lobe, left bronchus or lung: Secondary | ICD-10-CM | POA: Insufficient documentation

## 2022-12-19 ENCOUNTER — Encounter: Payer: Self-pay | Admitting: Radiation Oncology

## 2022-12-19 ENCOUNTER — Other Ambulatory Visit: Payer: Self-pay

## 2022-12-19 ENCOUNTER — Ambulatory Visit
Admission: RE | Admit: 2022-12-19 | Discharge: 2022-12-19 | Disposition: A | Discharge status: Home / Self Care | Payer: Medicare Other | Source: Ambulatory Visit | Attending: Radiation Oncology | Admitting: Radiation Oncology

## 2022-12-19 VITALS — BP 147/63 | HR 71 | Temp 97.3°F | Resp 18 | Ht 61.5 in | Wt 142.6 lb

## 2022-12-19 DIAGNOSIS — F17210 Nicotine dependence, cigarettes, uncomplicated: Secondary | ICD-10-CM | POA: Diagnosis not present

## 2022-12-19 DIAGNOSIS — E785 Hyperlipidemia, unspecified: Secondary | ICD-10-CM | POA: Diagnosis not present

## 2022-12-19 DIAGNOSIS — I10 Essential (primary) hypertension: Secondary | ICD-10-CM | POA: Diagnosis not present

## 2022-12-19 DIAGNOSIS — C3412 Malignant neoplasm of upper lobe, left bronchus or lung: Secondary | ICD-10-CM | POA: Diagnosis not present

## 2022-12-19 DIAGNOSIS — K219 Gastro-esophageal reflux disease without esophagitis: Secondary | ICD-10-CM | POA: Insufficient documentation

## 2022-12-19 DIAGNOSIS — M51369 Other intervertebral disc degeneration, lumbar region without mention of lumbar back pain or lower extremity pain: Secondary | ICD-10-CM | POA: Insufficient documentation

## 2022-12-19 DIAGNOSIS — M1990 Unspecified osteoarthritis, unspecified site: Secondary | ICD-10-CM | POA: Insufficient documentation

## 2022-12-19 DIAGNOSIS — E119 Type 2 diabetes mellitus without complications: Secondary | ICD-10-CM | POA: Insufficient documentation

## 2022-12-19 DIAGNOSIS — Z86718 Personal history of other venous thrombosis and embolism: Secondary | ICD-10-CM | POA: Insufficient documentation

## 2022-12-19 DIAGNOSIS — E039 Hypothyroidism, unspecified: Secondary | ICD-10-CM | POA: Insufficient documentation

## 2022-12-19 DIAGNOSIS — G2581 Restless legs syndrome: Secondary | ICD-10-CM | POA: Diagnosis not present

## 2022-12-19 DIAGNOSIS — Z860101 Personal history of adenomatous and serrated colon polyps: Secondary | ICD-10-CM | POA: Diagnosis not present

## 2022-12-19 NOTE — Progress Notes (Signed)
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  Stage IIb (T1c, N1, M0) LUL non-small cell lung cancer, squamous cell carcinoma   HISTORY OF PRESENT ILLNESS::Vicki Page is a 72 y.o. female who is seen as a courtesy of Dr. Delton Coombes for an opinion concerning management of the patient's recently diagnosed non-small cell lung cancer the patient has a history of smoking starting in her early 23's. She began on annual lung cancer screening with chest CT in 01/2020. Her most recent screening CT was performed on 10/03/22.  This exam revealed an enlarging aggressive-appearing left upper lobe pulmonary nodule, highly concerning for probable primary bronchogenic adenocarcinoma (Lung-RADS 4BS). This nodule was further evaluated with PET scan on 10/21/22 showing: hypermetabolism to previously-noted LUL pulmonary nodule; additional hypermetabolism in left hilar region, presumably a metastatic lymph node.  She was then referred to Dr. Tonia Brooms in pulmonology for further evaluation and work up. Dr. Tonia Brooms ordered a super D chest CT, which was performed on 11/07/22 and showed: stable LUL nodule; no evidence of metastatic disease. She proceeded to bronchoscopy with biopsies on 11/21/22 under Dr. Delton Coombes. Pathology from the needle aspirations of the LUL nodule revealed squamous cell carcinoma.   She was subsequently referred to Dr. Arbutus Ped on 12/15/22. They discussed treatment options, including surgery, chemotherapy, and radiation therapy.  PREVIOUS RADIATION THERAPY: No  PAST MEDICAL HISTORY:  Past Medical History:  Diagnosis Date   Adenomatous colon polyp    Aneurysm (HCC)    brain x 2    Anxiety    Arthritis    Atherosclerosis    Barrett's esophagus    Carotid artery disease (HCC)    Cataracts, bilateral    just watching   DDD (degenerative disc disease), lumbar    Depression    Diabetes mellitus without complication (HCC)    Diverticulosis    DVT (deep venous thrombosis) (HCC)    left leg   Fall at home 05/30/2014   Pt slipped in tub- hurt left hip and right shoulder   GERD (gastroesophageal reflux disease)    H. pylori infection    Hyperlipidemia    Hypertension    Hypothyroidism    IBS (irritable bowel syndrome)    Osteoarthritis    Poor sleep 04/28/2020   Pre-diabetes    no meds, diet controlled   RLS (restless legs syndrome)    Tubular adenoma of colon    Ulcer of the stomach and intestine     PAST SURGICAL HISTORY: Past Surgical History:  Procedure Laterality Date   ANAL RECTAL MANOMETRY N/A 05/04/2016   Procedure: ANO RECTAL MANOMETRY;  Surgeon: Napoleon Form, MD;  Location: [facility] ENDOSCOPY;  Service: Endoscopy;  Laterality: N/A;   ANEURYSM COILING  01/28/2010   stent-assisted coiling LICA aneurysm   BRAIN SURGERY     Per patient " Left side behind eyes   BRONCHIAL BIOPSY  11/21/2022   Procedure: BRONCHIAL BIOPSIES;  Surgeon: Leslye Peer, MD;  Location: [facility] ENDOSCOPY;  Service: Pulmonary;;   BRONCHIAL BRUSHINGS  11/21/2022   Procedure: BRONCHIAL BRUSHINGS;  Surgeon: Leslye Peer, MD;  Location: [facility] ENDOSCOPY;  Service: Pulmonary;;   BRONCHIAL NEEDLE ASPIRATION BIOPSY  11/21/2022  Procedure: BRONCHIAL NEEDLE ASPIRATION BIOPSIES;  Surgeon: Leslye Peer, MD;  Location: [facility] ENDOSCOPY;  Service: Pulmonary;;   BRONCHIAL WASHINGS  11/21/2022   Procedure: BRONCHIAL WASHINGS;  Surgeon: Leslye Peer, MD;  Location: [facility] ENDOSCOPY;  Service: Pulmonary;;   FIDUCIAL MARKER PLACEMENT  11/21/2022   Procedure: FIDUCIAL MARKER PLACEMENT;  Surgeon: Leslye Peer, MD;  Location: [facility] ENDOSCOPY;  Service: Pulmonary;;   INSERTION OF ILIAC STENT   10/04/2012   Procedure: INSERTION OF ILIAC STENT;  Surgeon: Corky Crafts, MD;  Location: [facility] CATH LAB;  Service: Cardiovascular;;  Left External Iliac Artery   IR ANGIO INTRA EXTRACRAN SEL COM CAROTID INNOMINATE BILAT MOD SED  11/23/2018   IR ANGIO INTRA EXTRACRAN SEL COM CAROTID INNOMINATE BILAT MOD SED  12/03/2020   IR ANGIO VERTEBRAL SEL SUBCLAVIAN INNOMINATE UNI L MOD SED  11/23/2018   IR ANGIO VERTEBRAL SEL VERTEBRAL BILAT MOD SED  12/03/2020   IR ANGIO VERTEBRAL SEL VERTEBRAL UNI L MOD SED  12/10/2018   IR ANGIO VERTEBRAL SEL VERTEBRAL UNI R MOD SED  11/23/2018   IR GENERIC HISTORICAL  11/03/2015   IR ANGIO VERTEBRAL SEL VERTEBRAL UNI L MOD SED 11/03/2015 Julieanne Cotton, MD [facility]   IR GENERIC HISTORICAL  11/03/2015   IR ANGIO INTRA EXTRACRAN SEL COM CAROTID INNOMINATE BILAT MOD SED 11/03/2015 Julieanne Cotton, MD [facility]   IR GENERIC HISTORICAL  11/03/2015   IR ANGIO VERTEBRAL SEL SUBCLAVIAN INNOMINATE UNI R MOD SED 11/03/2015 Julieanne Cotton, MD [facility]   IR GENERIC HISTORICAL  11/19/2015   IR RADIOLOGIST EVAL & MGMT 11/19/2015 [facility]   IR RADIOLOGIST EVAL & MGMT  02/15/2022   IR US GUIDE VASC ACCESS RIGHT  11/23/2018   IR US GUIDE VASC ACCESS RIGHT  12/03/2020   LEG SURGERY     LOWER EXTREMITY ANGIOGRAM Left 10/04/2012   and Left iliac stent   LOWER EXTREMITY ANGIOGRAM N/A 10/04/2012   Procedure: LOWER EXTREMITY ANGIOGRAM;  Surgeon: Corky Crafts, MD;  Location: [facility] CATH LAB;  Service: Cardiovascular;  Laterality: N/A;   RADIOLOGY WITH ANESTHESIA N/A 12/10/2018   Procedure: STENTING;  Surgeon: Julieanne Cotton, MD;  Location: [facility] OR;  Service: Radiology;  Laterality: N/A;   TUBAL LIGATION      FAMILY HISTORY:  Family History  Problem Relation Age of Onset   Deep vein thrombosis Mother    Diabetes Mother        Amputation   Heart disease Mother        Heart Disease before age 59   Hyperlipidemia Mother    Hypertension Mother     Heart attack Mother    Stroke Mother    Thyroid disease Mother    Heart disease Father        Heart Disease before age 26   Hypertension Father    Heart attack Father    Hyperlipidemia Father    Heart disease Brother        Heart Disease before age 25   Hyperlipidemia Brother    Hypertension Brother    Heart attack Brother    COPD Brother    Diabetes Brother    Colonic polyp Brother    Colon cancer Neg Hx    Stomach cancer Neg Hx     SOCIAL HISTORY:  Social History   Tobacco Use   Smoking status: Every Day    Current packs/day: 0.25    Types: Cigarettes   Smokeless tobacco: Never   Tobacco comments:  5 cigarettes smoked daily [facility] 03/11/20  Vaping Use   Vaping status: Former  Substance Use Topics   Alcohol use: No    Alcohol/week: 0.0 standard drinks of alcohol   Drug use: No    ALLERGIES:  Allergies  Allergen Reactions   Fluconazole     Mouth swelling and soreness    Ropinirole Nausea And Vomiting   Codeine Nausea And Vomiting   Darvon [Propoxyphene Hcl] Nausea And Vomiting   Flagyl [Metronidazole] Nausea And Vomiting    MEDICATIONS:  Current Outpatient Medications  Medication Sig Dispense Refill   acetaminophen (TYLENOL) 500 MG tablet Take 1,000 mg by mouth 2 (two) times daily as needed for pain.      amLODipine (NORVASC) 10 MG tablet Take 10 mg by mouth daily. for high blood pressure     aspirin EC 81 MG tablet Take 81 mg by mouth daily.     buPROPion (WELLBUTRIN SR) 150 MG 12 hr tablet Take 150 mg by mouth 2 (two) times daily.     Calcium-Vitamin D (CALTRATE 600 PLUS-VIT D PO) Take 1 tablet by mouth daily before supper.      Cholecalciferol (VITAMIN D3) 10 MCG (400 UNIT) CAPS Take 400 Units by mouth daily.     clopidogrel (PLAVIX) 75 MG tablet Take 1 tablet (75 mg total) by mouth daily with breakfast. Okay to restart on 11/22/2022.     losartan (COZAAR) 50 MG tablet Take 75 mg by mouth daily.     Magnesium 200 MG TABS Take 200 mg by mouth daily.      Multiple Vitamin (MULTIVITAMIN WITH MINERALS) TABS tablet Take 1 tablet by mouth daily before supper. Centrum Silver     ondansetron (ZOFRAN) 8 MG tablet Take 8 mg by mouth every 8 (eight) hours as needed.     pantoprazole (PROTONIX) 40 MG tablet Take 1 tablet (40 mg total) by mouth daily. 30 tablet 2   Probiotic Product (PROBIOTIC PO) Take 1 tablet by mouth daily after lunch.      Psyllium (METAMUCIL) 28.3 % POWD Take 1 Dose by mouth daily. 2 teaspoons full = 1 dose     Tetrahydrozoline HCl (VISINE OP) Place 1 drop into both eyes daily as needed (irritation/dry eyes).     vitamin B-12 (CYANOCOBALAMIN) 1000 MCG tablet Take 1,000 mcg by mouth daily.     vitamin C (ASCORBIC ACID) 500 MG tablet Take 500 mg by mouth daily.     No current facility-administered medications for this encounter.    REVIEW OF SYSTEMS:  A 10+ POINT REVIEW OF SYSTEMS WAS OBTAINED including neurology, dermatology, psychiatry, cardiac, respiratory, lymph, extremities, GI, GU, musculoskeletal, constitutional, reproductive, HEENT.  She has occasional discomfort in the chest not associated with shortness of breath.  She denies any significant cough or hemoptysis.  She reports being able to ambulate a flight of stairs but this is somewhat difficult for her.  She denies any headaches or nausea.   PHYSICAL EXAM:  height is 5' 1.5" (1.562 m) and weight is 142 lb 9.6 oz (64.7 kg). Her temperature is 97.3 F (36.3 C) (abnormal). Her blood pressure is 147/63 (abnormal) and her pulse is 71. Her respiration is 18 and oxygen saturation is 97%.   General: Alert and oriented, in no acute distress HEENT: Head is normocephalic. Extraocular movements are intact.  Neck: Neck is supple, no palpable cervical or supraclavicular lymphadenopathy. Heart: Regular in rate and rhythm with no murmurs, rubs, or gallops. Chest: Clear to auscultation bilaterally, with no  rhonchi, wheezes, or rales. Abdomen: Soft, nontender, nondistended, with no rigidity or  guarding. Extremities: No cyanosis or edema. Lymphatics: see Neck Exam Skin: No concerning lesions. Musculoskeletal: symmetric strength and muscle tone throughout. Neurologic: Cranial nerves II through XII are grossly intact. No obvious focalities. Speech is fluent. Coordination is intact. Psychiatric: Judgment and insight are intact. Affect is appropriate.   ECOG = 1  0 - Asymptomatic (Fully active, able to carry on all predisease activities without restriction)  1 - Symptomatic but completely ambulatory (Restricted in physically strenuous activity but ambulatory and able to carry out work of a light or sedentary nature. For example, light housework, office work)  2 - Symptomatic, <50% in bed during the day (Ambulatory and capable of all self care but unable to carry out any work activities. Up and about more than 50% of waking hours)  3 - Symptomatic, >50% in bed, but not bedbound (Capable of only limited self-care, confined to bed or chair 50% or more of waking hours)  4 - Bedbound (Completely disabled. Cannot carry on any self-care. Totally confined to bed or chair)  5 - Death   Santiago Glad MM, Creech RH, Tormey DC, et al. [phone]). "Toxicity and response criteria of the [employer] [employer] [facility] [employer]". Am. Evlyn Clines. Oncol. 5 (6): 649-55  LABORATORY DATA:  Lab Results  Component Value Date   WBC 5.3 12/15/2022   HGB 14.5 12/15/2022   HCT 43.7 12/15/2022   MCV 84.7 12/15/2022   PLT 287 12/15/2022   NEUTROABS 3.3 12/15/2022   Lab Results  Component Value Date   NA 142 12/15/2022   K 4.1 12/15/2022   CL 110 12/15/2022   CO2 27 12/15/2022   GLUCOSE 103 (H) 12/15/2022   BUN 15 12/15/2022   CREATININE 1.05 (H) 12/15/2022   CALCIUM 9.1 12/15/2022      RADIOGRAPHY: MM 3D SCREENING MAMMOGRAM BILATERAL BREAST  Result Date: 11/29/2022 CLINICAL DATA:  Screening. EXAM: DIGITAL SCREENING BILATERAL MAMMOGRAM WITH TOMOSYNTHESIS AND CAD TECHNIQUE: Bilateral screening digital  craniocaudal and mediolateral oblique mammograms were obtained. Bilateral screening digital breast tomosynthesis was performed. The images were evaluated with computer-aided detection. COMPARISON:  Previous exam(s). ACR Breast Density Category c: The breasts are heterogeneously dense, which may obscure small masses. FINDINGS: There are no findings suspicious for malignancy. IMPRESSION: No mammographic evidence of malignancy. A result letter of this screening mammogram will be mailed directly to the patient. RECOMMENDATION: Screening mammogram in one year. (Code:SM-B-01Y) BI-RADS CATEGORY  1: Negative. Electronically Signed   By: Edwin Cap M.D.   On: 11/29/2022 11:16   DG Chest Port 1 View  Result Date: 11/21/2022 CLINICAL DATA:  Status post bronchoscopy with biopsy. EXAM: PORTABLE CHEST 1 VIEW COMPARISON:  June 04, 2020. FINDINGS: The heart size and mediastinal contours are within normal limits. No pneumothorax is noted. Biopsy clip is seen in left midlung area. Mild opacity is noted around biopsy clip suggesting focal atelectasis or possibly small contusion related to biopsy. The visualized skeletal structures are unremarkable. IMPRESSION: No pneumothorax is noted. Mild opacity is noted around biopsy clip in left midlung suggesting focal atelectasis or possibly small contusion. Electronically Signed   By: Lupita Raider M.D.   On: 11/21/2022 14:25   DG C-ARM BRONCHOSCOPY  Result Date: 11/21/2022 C-ARM BRONCHOSCOPY: Fluoroscopy was utilized by the requesting physician.  No radiographic interpretation.      IMPRESSION: Stage IIb (T1c, N1, M0) LUL non-small cell lung cancer, squamous cell carcinoma   She would be a good  candidate for definitive course of radiation therapy along with radiosensitizing chemotherapy.  We discussed the general course of treatment anticipated side effects with radiation and potential long-term toxicities of a fractionated 6-week course of radiation therapy.  She is  scheduled to see Dr. Cliffton Asters later this week to see if she would be a candidate for surgery.  She also has pulmonary function studies scheduled prior to Dr. Lucilla Lame appointment.    PLAN: I have asked her to call us if she is not a candidate for surgery or if she decides against surgery for management of her non-small cell lung cancer.   60 minutes of total time was spent for this patient encounter, including preparation, face-to-face counseling with the patient and coordination of care, physical exam, and documentation of the encounter.   ------------------------------------------------  Billie Lade, PhD, MD  This document serves as a record of services personally performed by Antony Blackbird, MD. It was created on his behalf by Mickie Bail, a trained medical scribe. The creation of this record is based on the scribe's personal observations and the provider's statements to them. This document has been checked and approved by the attending provider.

## 2022-12-20 ENCOUNTER — Telehealth: Payer: Self-pay | Admitting: Licensed Clinical Social Worker

## 2022-12-20 NOTE — Telephone Encounter (Signed)
Left voicemail with contact details for CSW.

## 2022-12-21 ENCOUNTER — Inpatient Hospital Stay (HOSPITAL_COMMUNITY): Admission: RE | Admit: 2022-12-21 | Payer: Medicare Other | Source: Ambulatory Visit

## 2022-12-22 ENCOUNTER — Ambulatory Visit (HOSPITAL_COMMUNITY)
Admission: RE | Admit: 2022-12-22 | Discharge: 2022-12-22 | Disposition: A | Discharge status: Home / Self Care | Payer: Medicare Other | Source: Ambulatory Visit | Attending: Internal Medicine | Admitting: Internal Medicine

## 2022-12-22 DIAGNOSIS — C3412 Malignant neoplasm of upper lobe, left bronchus or lung: Secondary | ICD-10-CM | POA: Insufficient documentation

## 2022-12-22 LAB — PULMONARY FUNCTION TEST
DL/VA % pred: 48 %
DL/VA: 2.02 ml/min/mmHg/L
DLCO unc % pred: 37 %
DLCO unc: 7.07 ml/min/mmHg
FEF 25-75 Post: 0.9 L/s
FEF 25-75 Pre: 1.19 L/s
FEF2575-%Change-Post: -24 %
FEF2575-%Pred-Post: 50 %
FEF2575-%Pred-Pre: 67 %
FEV1-%Change-Post: -5 %
FEV1-%Pred-Post: 74 %
FEV1-%Pred-Pre: 78 %
FEV1-Post: 1.61 L
FEV1-Pre: 1.7 L
FEV1FVC-%Change-Post: 2 %
FEV1FVC-%Pred-Pre: 97 %
FEV6-%Change-Post: -7 %
FEV6-%Pred-Post: 78 %
FEV6-%Pred-Pre: 84 %
FEV6-Post: 2.14 L
FEV6-Pre: 2.32 L
FEV6FVC-%Change-Post: 0 %
FEV6FVC-%Pred-Post: 104 %
FEV6FVC-%Pred-Pre: 104 %
FVC-%Change-Post: -7 %
FVC-%Pred-Post: 74 %
FVC-%Pred-Pre: 80 %
FVC-Post: 2.14 L
FVC-Pre: 2.32 L
Post FEV1/FVC ratio: 75 %
Post FEV6/FVC ratio: 100 %
Pre FEV1/FVC ratio: 73 %
Pre FEV6/FVC Ratio: 100 %

## 2022-12-22 MED ORDER — ALBUTEROL SULFATE (2.5 MG/3ML) 0.083% IN NEBU
2.5000 mg | INHALATION_SOLUTION | Freq: Once | RESPIRATORY_TRACT | Status: AC
  Administered 2022-12-22: 2.5 mg via RESPIRATORY_TRACT

## 2022-12-23 ENCOUNTER — Institutional Professional Consult (permissible substitution) (INDEPENDENT_AMBULATORY_CARE_PROVIDER_SITE_OTHER): Payer: Medicare Other | Admitting: Thoracic Surgery (Cardiothoracic Vascular Surgery)

## 2022-12-23 VITALS — BP 139/68 | HR 75 | Resp 18 | Ht 61.0 in | Wt 139.0 lb

## 2022-12-23 DIAGNOSIS — R911 Solitary pulmonary nodule: Secondary | ICD-10-CM

## 2022-12-23 DIAGNOSIS — C3412 Malignant neoplasm of upper lobe, left bronchus or lung: Secondary | ICD-10-CM

## 2022-12-23 NOTE — Progress Notes (Signed)
[address]       [place] [address]             [phone]                    Vicki Page [facility] Record #[id number] Date of Birth: 1950-05-19  Referring: Si Gaul, MD Primary Care: Aliene Beams, MD Primary Cardiologist: None  Chief Complaint:    Chief Complaint  Patient presents with   Lung Lesion    Bronch 10/14, Chest CT 9/30, PET 9/13, PFTs 11/14    History of Present Illness:    Vicki Page 72 y.o. female presents for surgical evaluation of a stage IIb non-small cell lung cancer.  She was previously on lung cancer screening program where this was identified.  She recently stopped smoking a few weeks ago.         Past Medical History:  Diagnosis Date   Adenomatous colon polyp    Aneurysm (HCC)    brain x 2   Anxiety    Arthritis    Atherosclerosis    Barrett's esophagus    Carotid artery disease (HCC)    Cataracts, bilateral    just watching   DDD (degenerative disc disease), lumbar    Depression    Diabetes mellitus without complication (HCC)    Diverticulosis    DVT (deep venous thrombosis) (HCC)    left leg   Fall at home 05/30/2014   Pt slipped in tub- hurt left hip and right shoulder   GERD (gastroesophageal reflux disease)    H. pylori infection    Hyperlipidemia    Hypertension    Hypothyroidism    IBS (irritable bowel syndrome)    Osteoarthritis    Poor sleep 04/28/2020   Pre-diabetes    no meds, diet controlled   RLS (restless legs syndrome)    Tubular adenoma of colon    Ulcer of the stomach and intestine     Past Surgical History:  Procedure Laterality Date   ANAL RECTAL MANOMETRY N/A 05/04/2016   Procedure: ANO RECTAL MANOMETRY;  Surgeon: Napoleon Form, MD;  Location: [facility] ENDOSCOPY;  Service: Endoscopy;  Laterality: N/A;   ANEURYSM COILING  01/28/2010   stent-assisted coiling LICA aneurysm   BRAIN SURGERY     Per patient " Left side behind eyes   BRONCHIAL BIOPSY   11/21/2022   Procedure: BRONCHIAL BIOPSIES;  Surgeon: Leslye Peer, MD;  Location: [facility] ENDOSCOPY;  Service: Pulmonary;;   BRONCHIAL BRUSHINGS  11/21/2022   Procedure: BRONCHIAL BRUSHINGS;  Surgeon: Leslye Peer, MD;  Location: [facility] ENDOSCOPY;  Service: Pulmonary;;   BRONCHIAL NEEDLE ASPIRATION BIOPSY  11/21/2022   Procedure: BRONCHIAL NEEDLE ASPIRATION BIOPSIES;  Surgeon: Leslye Peer, MD;  Location: [facility] ENDOSCOPY;  Service: Pulmonary;;   BRONCHIAL WASHINGS  11/21/2022   Procedure: BRONCHIAL WASHINGS;  Surgeon: Leslye Peer, MD;  Location: [facility] ENDOSCOPY;  Service: Pulmonary;;   FIDUCIAL MARKER PLACEMENT  11/21/2022   Procedure: FIDUCIAL MARKER PLACEMENT;  Surgeon: Leslye Peer, MD;  Location: [facility] ENDOSCOPY;  Service: Pulmonary;;   INSERTION OF ILIAC STENT  10/04/2012   Procedure: INSERTION OF ILIAC STENT;  Surgeon: Corky Crafts, MD;  Location: [facility] CATH LAB;  Service: Cardiovascular;;  Left External Iliac Artery   IR ANGIO INTRA EXTRACRAN SEL COM CAROTID INNOMINATE BILAT MOD SED  11/23/2018   IR ANGIO INTRA EXTRACRAN SEL COM CAROTID INNOMINATE BILAT MOD SED  12/03/2020  IR ANGIO VERTEBRAL SEL SUBCLAVIAN INNOMINATE UNI L MOD SED  11/23/2018   IR ANGIO VERTEBRAL SEL VERTEBRAL BILAT MOD SED  12/03/2020   IR ANGIO VERTEBRAL SEL VERTEBRAL UNI L MOD SED  12/10/2018   IR ANGIO VERTEBRAL SEL VERTEBRAL UNI R MOD SED  11/23/2018   IR GENERIC HISTORICAL  11/03/2015   IR ANGIO VERTEBRAL SEL VERTEBRAL UNI L MOD SED 11/03/2015 Julieanne Cotton, MD [facility]   IR GENERIC HISTORICAL  11/03/2015   IR ANGIO INTRA EXTRACRAN SEL COM CAROTID INNOMINATE BILAT MOD SED 11/03/2015 Julieanne Cotton, MD [facility]   IR GENERIC HISTORICAL  11/03/2015   IR ANGIO VERTEBRAL SEL SUBCLAVIAN INNOMINATE UNI R MOD SED 11/03/2015 Julieanne Cotton, MD [facility]   IR GENERIC HISTORICAL  11/19/2015   IR RADIOLOGIST EVAL & MGMT 11/19/2015 [facility]   IR RADIOLOGIST EVAL & MGMT  02/15/2022   IR US  GUIDE VASC ACCESS RIGHT  11/23/2018   IR US GUIDE VASC ACCESS RIGHT  12/03/2020   LEG SURGERY     LOWER EXTREMITY ANGIOGRAM Left 10/04/2012   and Left iliac stent   LOWER EXTREMITY ANGIOGRAM N/A 10/04/2012   Procedure: LOWER EXTREMITY ANGIOGRAM;  Surgeon: Corky Crafts, MD;  Location: [facility] CATH LAB;  Service: Cardiovascular;  Laterality: N/A;   RADIOLOGY WITH ANESTHESIA N/A 12/10/2018   Procedure: STENTING;  Surgeon: Julieanne Cotton, MD;  Location: [facility] OR;  Service: Radiology;  Laterality: N/A;   TUBAL LIGATION      Family History  Problem Relation Age of Onset   Deep vein thrombosis Mother    Diabetes Mother        Amputation   Heart disease Mother        Heart Disease before age 63   Hyperlipidemia Mother    Hypertension Mother    Heart attack Mother    Stroke Mother    Thyroid disease Mother    Heart disease Father        Heart Disease before age 52   Hypertension Father    Heart attack Father    Hyperlipidemia Father    Heart disease Brother        Heart Disease before age 78   Hyperlipidemia Brother    Hypertension Brother    Heart attack Brother    COPD Brother    Diabetes Brother    Colonic polyp Brother    Colon cancer Neg Hx    Stomach cancer Neg Hx      Social History   Tobacco Use  Smoking Status Every Day   Current packs/day: 0.25   Types: Cigarettes  Smokeless Tobacco Never  Tobacco Comments   5 cigarettes smoked daily ARJ 03/11/20    Social History   Substance and Sexual Activity  Alcohol Use No   Alcohol/week: 0.0 standard drinks of alcohol     Allergies  Allergen Reactions   Fluconazole     Mouth swelling and soreness    Ropinirole Nausea And Vomiting   Codeine Nausea And Vomiting   Darvon [Propoxyphene Hcl] Nausea And Vomiting   Flagyl [Metronidazole] Nausea And Vomiting    Current Outpatient Medications  Medication Sig Dispense Refill   acetaminophen (TYLENOL) 500 MG tablet Take 1,000 mg by mouth 2 (two) times daily as  needed for pain.      amLODipine (NORVASC) 10 MG tablet Take 10 mg by mouth daily. for high blood pressure     aspirin EC 81 MG tablet Take 81 mg by mouth daily.  buPROPion (WELLBUTRIN SR) 150 MG 12 hr tablet Take 150 mg by mouth 2 (two) times daily.     Calcium-Vitamin D (CALTRATE 600 PLUS-VIT D PO) Take 1 tablet by mouth daily before supper.      Cholecalciferol (VITAMIN D3) 10 MCG (400 UNIT) CAPS Take 400 Units by mouth daily.     clopidogrel (PLAVIX) 75 MG tablet Take 1 tablet (75 mg total) by mouth daily with breakfast. Okay to restart on 11/22/2022.     losartan (COZAAR) 50 MG tablet Take 75 mg by mouth daily.     Magnesium 200 MG TABS Take 200 mg by mouth daily.     Multiple Vitamin (MULTIVITAMIN WITH MINERALS) TABS tablet Take 1 tablet by mouth daily before supper. Centrum Silver     ondansetron (ZOFRAN) 8 MG tablet Take 8 mg by mouth every 8 (eight) hours as needed.     pantoprazole (PROTONIX) 40 MG tablet Take 1 tablet (40 mg total) by mouth daily. 30 tablet 2   Probiotic Product (PROBIOTIC PO) Take 1 tablet by mouth daily after lunch.      Psyllium (METAMUCIL) 28.3 % POWD Take 1 Dose by mouth daily. 2 teaspoons full = 1 dose     Tetrahydrozoline HCl (VISINE OP) Place 1 drop into both eyes daily as needed (irritation/dry eyes).     vitamin B-12 (CYANOCOBALAMIN) 1000 MCG tablet Take 1,000 mcg by mouth daily.     vitamin C (ASCORBIC ACID) 500 MG tablet Take 500 mg by mouth daily.     No current facility-administered medications for this visit.    Review of Systems  Constitutional:  Negative for weight loss.  Respiratory:  Positive for cough.   Cardiovascular:  Negative for chest pain.  Neurological: Negative.      PHYSICAL EXAMINATION: BP 139/68 (BP Location: Left Arm, Patient Position: Sitting)   Pulse 75   Resp 18   Ht 5\' 1"  (1.549 m)   Wt 139 lb (63 kg)   LMP  (LMP Unknown)   SpO2 98% Comment: RA  BMI 26.26 kg/m  Physical Exam Constitutional:      General: She  is not in acute distress.    Appearance: She is not ill-appearing.  Eyes:     Extraocular Movements: Extraocular movements intact.  Cardiovascular:     Rate and Rhythm: Normal rate.  Abdominal:     General: Abdomen is flat. There is no distension.  Musculoskeletal:        General: Normal range of motion.  Skin:    General: Skin is warm and dry.  Neurological:     General: No focal deficit present.     Mental Status: She is alert and oriented to person, place, and time.          I have independently reviewed the above radiology studies  and reviewed the findings with the patient.   Recent Lab Findings: Lab Results  Component Value Date   WBC 5.3 12/15/2022   HGB 14.5 12/15/2022   HCT 43.7 12/15/2022   PLT 287 12/15/2022   GLUCOSE 103 (H) 12/15/2022   ALT 11 12/15/2022   AST 17 12/15/2022   NA 142 12/15/2022   K 4.1 12/15/2022   CL 110 12/15/2022   CREATININE 1.05 (H) 12/15/2022   BUN 15 12/15/2022   CO2 27 12/15/2022   INR 1.0 12/10/2018    Diagnostic Studies & Laboratory data:     Recent Radiology Findings:   MM 3D SCREENING MAMMOGRAM BILATERAL BREAST  Result  Date: 11/29/2022 CLINICAL DATA:  Screening. EXAM: DIGITAL SCREENING BILATERAL MAMMOGRAM WITH TOMOSYNTHESIS AND CAD TECHNIQUE: Bilateral screening digital craniocaudal and mediolateral oblique mammograms were obtained. Bilateral screening digital breast tomosynthesis was performed. The images were evaluated with computer-aided detection. COMPARISON:  Previous exam(s). ACR Breast Density Category c: The breasts are heterogeneously dense, which may obscure small masses. FINDINGS: There are no findings suspicious for malignancy. IMPRESSION: No mammographic evidence of malignancy. A result letter of this screening mammogram will be mailed directly to the patient. RECOMMENDATION: Screening mammogram in one year. (Code:SM-B-01Y) BI-RADS CATEGORY  1: Negative. Electronically Signed   By: Edwin Cap M.D.   On:  11/29/2022 11:16     PFTs:  -DLCO: 37%    Assessment / Plan:   72 year old female with a left upper lobe non-small cell lung cancer.  Stage IIb, T1c, N1, M0.  Unfortunately her PFT showed that a DLCO is 37% which would make her extremely high risk of lifelong oxygen, and complications with surgery.  I recommended that she undergo SBRT and chemotherapy for treatment of this.     I  spent 40 minutes with  the patient face to face in counseling and coordination of care.    Vicki Page 12/23/2022 4:18 PM

## 2022-12-26 ENCOUNTER — Other Ambulatory Visit: Payer: Self-pay | Admitting: Internal Medicine

## 2022-12-26 DIAGNOSIS — C3490 Malignant neoplasm of unspecified part of unspecified bronchus or lung: Secondary | ICD-10-CM

## 2022-12-27 NOTE — Progress Notes (Signed)
I reached out to the pt to let her know we need to start making arrangements for follow up appts with Dr Arbutus Ped and Dr Roselind Messier to arrange for chemoradiation per Dr.Lightfoot's recommendation as she is not a candidate for surgery. No answer at her mobile/home phone. I left a VM with my direct number asking she call me.  I also reached out to the pts daughter, [name], asking that if she hears from the pt that she give me a call and I left my number with Dianna as well.

## 2022-12-28 ENCOUNTER — Other Ambulatory Visit: Payer: Self-pay | Admitting: Internal Medicine

## 2022-12-28 MED ORDER — LORAZEPAM 0.5 MG PO TABS: ORAL_TABLET | ORAL | 0 refills | Status: AC

## 2022-12-30 ENCOUNTER — Other Ambulatory Visit: Payer: Self-pay

## 2022-12-30 DIAGNOSIS — C3412 Malignant neoplasm of upper lobe, left bronchus or lung: Secondary | ICD-10-CM

## 2023-01-02 ENCOUNTER — Ambulatory Visit (HOSPITAL_COMMUNITY)
Admission: RE | Admit: 2023-01-02 | Discharge: 2023-01-02 | Disposition: A | Discharge status: Home / Self Care | Payer: Medicare Other | Source: Ambulatory Visit | Attending: Internal Medicine | Admitting: Internal Medicine

## 2023-01-02 ENCOUNTER — Telehealth: Payer: Self-pay | Admitting: Radiation Oncology

## 2023-01-02 DIAGNOSIS — I6782 Cerebral ischemia: Secondary | ICD-10-CM | POA: Diagnosis not present

## 2023-01-02 DIAGNOSIS — C3490 Malignant neoplasm of unspecified part of unspecified bronchus or lung: Secondary | ICD-10-CM | POA: Diagnosis not present

## 2023-01-02 DIAGNOSIS — I671 Cerebral aneurysm, nonruptured: Secondary | ICD-10-CM | POA: Diagnosis not present

## 2023-01-02 DIAGNOSIS — G319 Degenerative disease of nervous system, unspecified: Secondary | ICD-10-CM | POA: Diagnosis not present

## 2023-01-02 MED ORDER — GADOBUTROL 1 MMOL/ML IV SOLN
7.0000 mL | Freq: Once | INTRAVENOUS | Status: AC | PRN
  Administered 2023-01-02: 7 mL via INTRAVENOUS

## 2023-01-02 NOTE — Telephone Encounter (Signed)
Called patient to schedule a FUN visit w. Dr. Roselind Messier. No answer, LVM for a return call.

## 2023-01-03 ENCOUNTER — Telehealth: Payer: Self-pay | Admitting: Radiation Oncology

## 2023-01-03 ENCOUNTER — Telehealth: Payer: Self-pay | Admitting: Internal Medicine

## 2023-01-03 NOTE — Telephone Encounter (Signed)
Left patient and patient's daughter a message in regards to schedled appointment times/dates for follow up appointment; left callback number if needed for rescheduling

## 2023-01-03 NOTE — Telephone Encounter (Signed)
Called patient and patients daughter to schedule a FUN visit w. Dr. Roselind Messier. No answer, LVM for a return call.

## 2023-01-03 NOTE — Progress Notes (Signed)
I reached out to the pt to make sure she knew her appt with C.Heilingoetter, Onc PA would be in person with labs prior. Pt did not answer. I left a VM.  I reached out to pt's daughter, [name], to let her know as well. Dianna asked when they had planning on starting treatment and I had to tell her she had not yet been scheduled. Dianna was understandably upset that the pt had not yet been scheduled for her infusions because "her cancer is only going to get worse" I verbalized understanding and sympathy for the pt and apologized for the delay. I let her know I wasn't underplaying the importance of her mother's diagnosis, but that there are a lot of pts that we are trying to get scheduled in a timely fashion. Dianna seemed to understand the situation and we closed the conversation without any questions.

## 2023-01-04 ENCOUNTER — Telehealth: Payer: Self-pay | Admitting: Radiation Oncology

## 2023-01-04 NOTE — Telephone Encounter (Signed)
Sent unable to contact letter 11/27.

## 2023-01-04 NOTE — Progress Notes (Signed)
 The patient called me back after I spoke to her daughter. Pt asked why people don't call her, and I explained that she is the first one called, however the call hasn't been answered so a VM is left and if it's an urgent request to speak to her, we call her daughter since she is the only other number in her chart. Pt doesn't understand since she always has her phone in her hand. Pt then mentioned that her daughter had told her what I had, that there are so many pts that we are trying to schedule in a timely fashion, but we are taking her diagnosis seriously and trying our best to get her in ASAP. Pt states "an appt and lab work is not going to cure me, I need treatment, I'm already stage 2 which wasn't my fault that it got this far" pt is referring to the imaging misread in Aug of 2023. Pt states "I'm stage 2, only 2 stages to go and I'm dead" Pt asked if she can go to another facility and I told her that that is her right to do so. Pt thinks it will be stage III by thanksgiving and that she's certain she will be dead by the end of the year.  I verbalized understanding of her frustration and reiterated that we are doing everything to get all of our patients in as soon as possible. I started to explain again that we need to be able to get a hold of her by phone, but pt had hung up at that time.  I called the pt back at 5;17pm after her appt was changed to 12/4 with Dr Arbutus Ped who would be going over the pt's treatment options. Her phone went straight to VM. I left a message asking her to call me back and left her number.

## 2023-01-04 NOTE — Telephone Encounter (Signed)
Patient returned call to schedule FUN visit w. Dr. Roselind Messier. Patient now scheduled, unable to contact letter cancelled.

## 2023-01-04 NOTE — Telephone Encounter (Signed)
Called patient and patient's daughter to schedule the patient for a FUN visit w. Dr. Roselind Messier. No answer, LVM for a return call.

## 2023-01-08 NOTE — Progress Notes (Signed)
Radiation Oncology         [phone] ________________________________  Follow-Up New Visit   Outpatient   Name: Vicki Page MRN: [id number]  Date: 01/09/2023  DOB: 1950-08-03  WU:[id number], Fleet Contras, MD  Si Gaul, MD   REFERRING PHYSICIAN: Si Gaul, MD  DIAGNOSIS: {There were no encounter diagnoses. (Refresh or delete this SmartLink)}  Stage IIb (T1c, N1, M0) LUL non-small cell lung cancer, squamous cell carcinoma   Narrative/Interval History - 01/09/23 ::Vicki Page is a 72 y.o. female who who returns today to further discuss the role of radiation therapy in management of her recently diagnosed left lung cancer. To review from her initial consultation visit, we discussed that she would be a good candidate for a definitive course of radiation therapy along with radiosensitizing chemotherapy. Given that she had not yet been seen by cardiothoracic surgery at that time for PFT's and to discuss resection, we decided that she would return to [place] to make a more well informed treatment decision based on her upcoming evaluation with Dr. Cliffton Asters.   Since her consultation date, she was accordingly seen in consultation by Dr. Cliffton Asters on 12/23/22. Based on her PFTs showing a DLCO of 37%, she is unfortunately not a good surgical candidate. Dr. Cliffton Asters has subsequently recommended that she proceed with SBRT and chemotherapy which we will discuss again today.   Pertinent imaging performed in the interval since her initial consultation date includes an MRI of the brain with and without contrast on 01/02/23 which showed no evidence of intracranial metastatic disease. 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   HPI - Initial Consultation 12/19/22 ::Vicki Page is a 72 y.o. female who is seen as a courtesy of Dr. Delton Coombes  for an opinion concerning management of the patient's recently diagnosed non-small cell lung cancer the patient has a history of smoking starting in her early 58's. She began on annual lung cancer screening with chest CT in 01/2020. Her most recent screening CT was performed on 10/03/22.  This exam revealed an enlarging aggressive-appearing left upper lobe pulmonary nodule, highly concerning for probable primary bronchogenic adenocarcinoma (Lung-RADS 4BS). This nodule was further evaluated with PET scan on 10/21/22 showing: hypermetabolism to previously-noted LUL pulmonary nodule; additional hypermetabolism in left hilar region, presumably a metastatic lymph node.   She was then referred to Dr. Tonia Brooms in pulmonology for further evaluation and work up. Dr. Tonia Brooms ordered a super D chest CT, which was performed on 11/07/22 and showed: stable LUL nodule; no evidence of metastatic disease. She proceeded to bronchoscopy with biopsies on 11/21/22 under Dr. Delton Coombes. Pathology from the needle aspirations of the LUL nodule revealed squamous cell carcinoma.    She was subsequently referred to Dr. Arbutus Ped on 12/15/22. They discussed treatment options, including surgery, chemotherapy, and radiation therapy.   PREVIOUS RADIATION THERAPY: No  PAST MEDICAL HISTORY:  Past Medical History:  Diagnosis Date   Adenomatous colon polyp    Aneurysm (HCC)    brain x 2   Anxiety    Arthritis    Atherosclerosis    Barrett's esophagus    Carotid artery disease (HCC)    Cataracts, bilateral    just watching   DDD (degenerative disc disease), lumbar    Depression    Diabetes mellitus without complication (HCC)    Diverticulosis    DVT (deep venous  thrombosis) (HCC)    left leg   Fall at home 05/30/2014   Pt slipped in tub- hurt left hip and right shoulder   GERD (gastroesophageal reflux disease)    H. pylori infection    Hyperlipidemia    Hypertension    Hypothyroidism    IBS (irritable bowel syndrome)     Osteoarthritis    Poor sleep 04/28/2020   Pre-diabetes    no meds, diet controlled   RLS (restless legs syndrome)    Tubular adenoma of colon    Ulcer of the stomach and intestine     PAST SURGICAL HISTORY: Past Surgical History:  Procedure Laterality Date   ANAL RECTAL MANOMETRY N/A 05/04/2016   Procedure: ANO RECTAL MANOMETRY;  Surgeon: Napoleon Form, MD;  Location: [facility] ENDOSCOPY;  Service: Endoscopy;  Laterality: N/A;   ANEURYSM COILING  01/28/2010   stent-assisted coiling LICA aneurysm   BRAIN SURGERY     Per patient " Left side behind eyes   BRONCHIAL BIOPSY  11/21/2022   Procedure: BRONCHIAL BIOPSIES;  Surgeon: Leslye Peer, MD;  Location[facility] ENDOSCOPY;  Service: Pulmonary;;   BRONCHIAL BRUSHINGS  11/21/2022   Procedure: BRONCHIAL BRUSHINGS;  Surgeon: Leslye Peer, MD;  Location: [facility] ENDOSCOPY;  Service: Pulmonary;;   BRONCHIAL NEEDLE ASPIRATION BIOPSY  11/21/2022   Procedure: BRONCHIAL NEEDLE ASPIRATION BIOPSIES;  Surgeon: Leslye Peer, MD;  Location: [facility] ENDOSCOPY;  Service: Pulmonary;;   BRONCHIAL WASHINGS  11/21/2022   Procedure: BRONCHIAL WASHINGS;  Surgeon: Leslye Peer, MD;  Location: [facility] ENDOSCOPY;  Service: Pulmonary;;   FIDUCIAL MARKER PLACEMENT  11/21/2022   Procedure: FIDUCIAL MARKER PLACEMENT;  Surgeon: Leslye Peer, MD;  Location: [facility] ENDOSCOPY;  Service: Pulmonary;;   INSERTION OF ILIAC STENT  10/04/2012   Procedure: INSERTION OF ILIAC STENT;  Surgeon: Corky Crafts, MD;  Location: [facility] CATH LAB;  Service: Cardiovascular;;  Left External Iliac Artery   IR ANGIO INTRA EXTRACRAN SEL COM CAROTID INNOMINATE BILAT MOD SED  11/23/2018   IR ANGIO INTRA EXTRACRAN SEL COM CAROTID INNOMINATE BILAT MOD SED  12/03/2020   IR ANGIO VERTEBRAL SEL SUBCLAVIAN INNOMINATE UNI L MOD SED  11/23/2018   IR ANGIO VERTEBRAL SEL VERTEBRAL BILAT MOD SED  12/03/2020   IR ANGIO VERTEBRAL SEL VERTEBRAL UNI L MOD SED  12/10/2018   IR ANGIO VERTEBRAL SEL VERTEBRAL UNI R MOD  SED  11/23/2018   IR GENERIC HISTORICAL  11/03/2015   IR ANGIO VERTEBRAL SEL VERTEBRAL UNI L MOD SED 11/03/2015 Julieanne Cotton, MD [facility]   IR GENERIC HISTORICAL  11/03/2015   IR ANGIO INTRA EXTRACRAN SEL COM CAROTID INNOMINATE BILAT MOD SED 11/03/2015 Julieanne Cotton, MD [facility]   IR GENERIC HISTORICAL  11/03/2015   IR ANGIO VERTEBRAL SEL SUBCLAVIAN INNOMINATE UNI R MOD SED 11/03/2015 Julieanne Cotton, MD [facility]   IR GENERIC HISTORICAL  11/19/2015   IR RADIOLOGIST EVAL & MGMT 11/19/2015 [facility]   IR RADIOLOGIST EVAL & MGMT  02/15/2022   IR US GUIDE VASC ACCESS RIGHT  11/23/2018   IR US GUIDE VASC ACCESS RIGHT  12/03/2020   LEG SURGERY     LOWER EXTREMITY ANGIOGRAM Left 10/04/2012   and Left iliac stent   LOWER EXTREMITY ANGIOGRAM N/A 10/04/2012   Procedure: LOWER EXTREMITY ANGIOGRAM;  Surgeon: Corky Crafts, MD;  Location: [facility] CATH LAB;  Service: Cardiovascular;  Laterality: N/A;   RADIOLOGY WITH ANESTHESIA N/A 12/10/2018   Procedure: STENTING;  Surgeon: Julieanne Cotton, MD;  Location:  [facility] OR;  Service: Radiology;  Laterality: N/A;   TUBAL LIGATION      FAMILY HISTORY:  Family History  Problem Relation Age of Onset   Deep vein thrombosis Mother    Diabetes Mother        Amputation   Heart disease Mother        Heart Disease before age 42   Hyperlipidemia Mother    Hypertension Mother    Heart attack Mother    Stroke Mother    Thyroid disease Mother    Heart disease Father        Heart Disease before age 6   Hypertension Father    Heart attack Father    Hyperlipidemia Father    Heart disease Brother        Heart Disease before age 48   Hyperlipidemia Brother    Hypertension Brother    Heart attack Brother    COPD Brother    Diabetes Brother    Colonic polyp Brother    Colon cancer Neg Hx    Stomach cancer Neg Hx     SOCIAL HISTORY:  Social History   Tobacco Use   Smoking status: Every Day    Current packs/day: 0.25     Types: Cigarettes   Smokeless tobacco: Never   Tobacco comments:    5 cigarettes smoked daily [facility]J 03/11/20  Vaping Use   Vaping status: Former  Substance Use Topics   Alcohol use: No    Alcohol/week: 0.0 standard drinks of alcohol   Drug use: No    ALLERGIES:  Allergies  Allergen Reactions   Fluconazole     Mouth swelling and soreness    Ropinirole Nausea And Vomiting   Codeine Nausea And Vomiting   Darvon [Propoxyphene Hcl] Nausea And Vomiting   Flagyl [Metronidazole] Nausea And Vomiting    MEDICATIONS:  Current Outpatient Medications  Medication Sig Dispense Refill   acetaminophen (TYLENOL) 500 MG tablet Take 1,000 mg by mouth 2 (two) times daily as needed for pain.      amLODipine (NORVASC) 10 MG tablet Take 10 mg by mouth daily. for high blood pressure     aspirin EC 81 MG tablet Take 81 mg by mouth daily.     buPROPion (WELLBUTRIN SR) 150 MG 12 hr tablet Take 150 mg by mouth 2 (two) times daily.     Calcium-Vitamin D (CALTRATE 600 PLUS-VIT D PO) Take 1 tablet by mouth daily before supper.      Cholecalciferol (VITAMIN D3) 10 MCG (400 UNIT) CAPS Take 400 Units by mouth daily.     clopidogrel (PLAVIX) 75 MG tablet Take 1 tablet (75 mg total) by mouth daily with breakfast. Okay to restart on 11/22/2022.     LORazepam (ATIVAN) 0.5 MG tablet 1 tablet p.o. 30 minutes before MRI.  Repeat x 1 if needed 2 tablet 0   losartan (COZAAR) 50 MG tablet Take 75 mg by mouth daily.     Magnesium 200 MG TABS Take 200 mg by mouth daily.     Multiple Vitamin (MULTIVITAMIN WITH MINERALS) TABS tablet Take 1 tablet by mouth daily before supper. Centrum Silver     ondansetron (ZOFRAN) 8 MG tablet Take 8 mg by mouth every 8 (eight) hours as needed.     pantoprazole (PROTONIX) 40 MG tablet Take 1 tablet (40 mg total) by mouth daily. 30 tablet 2   Probiotic Product (PROBIOTIC PO) Take 1 tablet by mouth daily after lunch.      Psyllium (  METAMUCIL) 28.3 % POWD Take 1 Dose by mouth daily. 2 teaspoons  full = 1 dose     Tetrahydrozoline HCl (VISINE OP) Place 1 drop into both eyes daily as needed (irritation/dry eyes).     vitamin B-12 (CYANOCOBALAMIN) 1000 MCG tablet Take 1,000 mcg by mouth daily.     vitamin C (ASCORBIC ACID) 500 MG tablet Take 500 mg by mouth daily.     No current facility-administered medications for this encounter.    REVIEW OF SYSTEMS:  A 10+ POINT REVIEW OF SYSTEMS WAS OBTAINED including neurology, dermatology, psychiatry, cardiac, respiratory, lymph, extremities, GI, GU, musculoskeletal, constitutional, reproductive, HEENT. ***   PHYSICAL EXAM:  vitals were not taken for this visit.   General: Alert and oriented, in no acute distress HEENT: Head is normocephalic. Extraocular movements are intact. Oropharynx is clear. Neck: Neck is supple, no palpable cervical or supraclavicular lymphadenopathy. Heart: Regular in rate and rhythm with no murmurs, rubs, or gallops. Chest: Clear to auscultation bilaterally, with no rhonchi, wheezes, or rales. Abdomen: Soft, nontender, nondistended, with no rigidity or guarding. Extremities: No cyanosis or edema. Lymphatics: see Neck Exam Skin: No concerning lesions. Musculoskeletal: symmetric strength and muscle tone throughout. Neurologic: Cranial nerves II through XII are grossly intact. No obvious focalities. Speech is fluent. Coordination is intact. Psychiatric: Judgment and insight are intact. Affect is appropriate. ***  ECOG = ***  0 - Asymptomatic (Fully active, able to carry on all predisease activities without restriction)  1 - Symptomatic but completely ambulatory (Restricted in physically strenuous activity but ambulatory and able to carry out work of a light or sedentary nature. For example, light housework, office work)  2 - Symptomatic, <50% in bed during the day (Ambulatory and capable of all self care but unable to carry out any work activities. Up and about more than 50% of waking hours)  3 - Symptomatic, >50%  in bed, but not bedbound (Capable of only limited self-care, confined to bed or chair 50% or more of waking hours)  4 - Bedbound (Completely disabled. Cannot carry on any self-care. Totally confined to bed or chair)  5 - Death   Santiago Glad MM, Creech RH, Tormey DC, et al. [id number]). "Toxicity and response criteria of the [facility]". Am. Evlyn Clines. Oncol. 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 Electronically Signed   By: Jackey Loge D.O.   On: 01/03/2023 09:13   Result Date: 01/03/2023 CLINICAL DATA:  Provided history: Malignant neoplasm of unspecified part of unspecified bronchus or lung. Non-small cell lung cancer, staging. EXAM: MRI HEAD WITHOUT AND WITH CONTRAST TECHNIQUE: Multiplanar, multiecho pulse sequences of the brain and surrounding structures were obtained without and with intravenous contrast. CONTRAST:  7mL GADAVIST GADOBUTROL 1 MMOL/ML IV SOLN COMPARISON:  CTA head 03/22/2022.  Brain MRI 07/25/2019. FINDINGS: Brain: Mild  generalized parenchymal atrophy. Multifocal T2 FLAIR hyperintense signal abnormality within the cerebral white matter, nonspecific but compatible with moderate chronic small vessel ischemic disease. 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 Partially empty sella turcica again noted. There is no acute infarct. No evidence of an  intracranial mass. No chronic intracranial blood products. No extra-axial fluid collection. No midline shift. No pathologic intracranial enhancement identified. Vascular: Maintained flow voids within the proximal large arterial vessels. Skull and upper cervical spine: No focal worrisome marrow lesion. Incompletely assessed cervical spondylosis. Sinuses/Orbits: No mass or acute finding within the imaged orbits. Trace mucosal thickening scattered within the paranasal sinuses. IMPRESSION: 1. No evidence of intracranial metastatic disease. 2. Moderate chronic small vessel ischemic changes within the cerebral white matter, similar to the prior brain MRI of 07/25/2019. 3. 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 4. Mild generalized parenchymal atrophy. Electronically Signed: By: Jackey Loge D.O. On: 01/03/2023 09:02      IMPRESSION: Stage IIb (T1c, N1, M0) LUL non-small cell lung cancer, squamous cell carcinoma    ***  Today, I talked to the patient and family about the findings and work-up thus far.  We discussed the natural history of *** and general treatment, highlighting the role of radiotherapy in the management.  We discussed the available radiation techniques, and focused on the details of logistics and delivery.  We reviewed the anticipated acute and late sequelae associated with radiation in this setting.  The patient was encouraged to ask questions that I answered to the best of my ability. *** A patient consent form was discussed and signed.  We retained a copy for our records.  The patient would like to proceed with radiation and will be scheduled for CT simulation.  PLAN: ***    *** minutes of total time was spent for this patient encounter, including preparation, face-to-face counseling with the patient and coordination of care, physical exam, and documentation of the encounter.   ------------------------------------------------  Billie Lade,  PhD, MD  This document serves as a record of services personally performed by Antony Blackbird, MD. It was created on his behalf by Neena Rhymes, a trained medical scribe. The creation of this record is based on the scribe's personal observations and the provider's statements to them. This document has been checked and approved by the attending provider.

## 2023-01-09 ENCOUNTER — Ambulatory Visit
Admission: RE | Admit: 2023-01-09 | Discharge: 2023-01-09 | Disposition: A | Discharge status: Home / Self Care | Payer: Medicare Other | Source: Ambulatory Visit | Attending: Radiation Oncology | Admitting: Radiation Oncology

## 2023-01-09 ENCOUNTER — Encounter: Payer: Self-pay | Admitting: Radiation Oncology

## 2023-01-09 VITALS — BP 127/53 | HR 68 | Temp 97.5°F | Resp 20 | Ht 61.6 in | Wt 141.6 lb

## 2023-01-09 DIAGNOSIS — K219 Gastro-esophageal reflux disease without esophagitis: Secondary | ICD-10-CM | POA: Diagnosis not present

## 2023-01-09 DIAGNOSIS — Z860101 Personal history of adenomatous and serrated colon polyps: Secondary | ICD-10-CM | POA: Insufficient documentation

## 2023-01-09 DIAGNOSIS — E039 Hypothyroidism, unspecified: Secondary | ICD-10-CM | POA: Insufficient documentation

## 2023-01-09 DIAGNOSIS — F17210 Nicotine dependence, cigarettes, uncomplicated: Secondary | ICD-10-CM | POA: Insufficient documentation

## 2023-01-09 DIAGNOSIS — C3412 Malignant neoplasm of upper lobe, left bronchus or lung: Secondary | ICD-10-CM | POA: Diagnosis not present

## 2023-01-09 DIAGNOSIS — Z7902 Long term (current) use of antithrombotics/antiplatelets: Secondary | ICD-10-CM | POA: Insufficient documentation

## 2023-01-09 DIAGNOSIS — Z7982 Long term (current) use of aspirin: Secondary | ICD-10-CM | POA: Insufficient documentation

## 2023-01-09 DIAGNOSIS — M1990 Unspecified osteoarthritis, unspecified site: Secondary | ICD-10-CM | POA: Diagnosis not present

## 2023-01-09 DIAGNOSIS — K589 Irritable bowel syndrome without diarrhea: Secondary | ICD-10-CM | POA: Diagnosis not present

## 2023-01-09 DIAGNOSIS — I10 Essential (primary) hypertension: Secondary | ICD-10-CM | POA: Diagnosis not present

## 2023-01-09 DIAGNOSIS — E119 Type 2 diabetes mellitus without complications: Secondary | ICD-10-CM | POA: Diagnosis not present

## 2023-01-09 DIAGNOSIS — E785 Hyperlipidemia, unspecified: Secondary | ICD-10-CM | POA: Insufficient documentation

## 2023-01-09 DIAGNOSIS — G2581 Restless legs syndrome: Secondary | ICD-10-CM | POA: Insufficient documentation

## 2023-01-09 DIAGNOSIS — Z8719 Personal history of other diseases of the digestive system: Secondary | ICD-10-CM | POA: Insufficient documentation

## 2023-01-09 DIAGNOSIS — M51369 Other intervertebral disc degeneration, lumbar region without mention of lumbar back pain or lower extremity pain: Secondary | ICD-10-CM | POA: Diagnosis not present

## 2023-01-09 DIAGNOSIS — Z79899 Other long term (current) drug therapy: Secondary | ICD-10-CM | POA: Insufficient documentation

## 2023-01-09 DIAGNOSIS — Z86718 Personal history of other venous thrombosis and embolism: Secondary | ICD-10-CM | POA: Diagnosis not present

## 2023-01-09 NOTE — Progress Notes (Signed)
Location of tumor and Histology per Pathology Report:   Biopsy:    Past/Anticipated interventions by surgeon, if any: left, Dr. Cliffton Asters 12/23/22     Past/Anticipated interventions by medical oncology, if any:      Pain issues, if any:  yes, patient reports having pain to right shoulder and arm. Patient rates pain 9/10  SAFETY ISSUES: Prior radiation? no Pacemaker/ICD? no Possible current pregnancy? no Is the patient on methotrexate? no  Current Complaints / other details:      BP (!) 127/53 (BP Location: Right Arm, Patient Position: Sitting, Cuff Size: Normal)   Pulse 68   Temp (!) 97.5 F (36.4 C)   Resp 20   Ht 5' 1.6" (1.565 m)   Wt 141 lb 9.6 oz (64.2 kg)   LMP  (LMP Unknown)   SpO2 100%   BMI 26.24 kg/m

## 2023-01-10 ENCOUNTER — Inpatient Hospital Stay: Payer: Medicare Other

## 2023-01-10 ENCOUNTER — Ambulatory Visit: Payer: Medicare Other | Admitting: Physician Assistant

## 2023-01-11 ENCOUNTER — Other Ambulatory Visit: Payer: Self-pay

## 2023-01-11 ENCOUNTER — Ambulatory Visit: Payer: Medicare Other | Admitting: Radiation Oncology

## 2023-01-11 DIAGNOSIS — C3490 Malignant neoplasm of unspecified part of unspecified bronchus or lung: Secondary | ICD-10-CM

## 2023-01-12 ENCOUNTER — Ambulatory Visit
Admission: RE | Admit: 2023-01-12 | Discharge: 2023-01-12 | Disposition: A | Discharge status: Home / Self Care | Payer: Medicare Other | Source: Ambulatory Visit | Attending: Radiation Oncology | Admitting: Radiation Oncology

## 2023-01-12 ENCOUNTER — Inpatient Hospital Stay: Payer: Medicare Other | Admitting: Internal Medicine

## 2023-01-12 ENCOUNTER — Inpatient Hospital Stay: Payer: Medicare Other

## 2023-01-12 ENCOUNTER — Other Ambulatory Visit: Payer: Self-pay | Admitting: Internal Medicine

## 2023-01-12 ENCOUNTER — Encounter: Payer: Self-pay | Admitting: Internal Medicine

## 2023-01-12 VITALS — BP 139/58 | HR 59 | Temp 97.9°F | Resp 17 | Ht 61.6 in | Wt 141.5 lb

## 2023-01-12 DIAGNOSIS — C3412 Malignant neoplasm of upper lobe, left bronchus or lung: Secondary | ICD-10-CM | POA: Insufficient documentation

## 2023-01-12 DIAGNOSIS — E785 Hyperlipidemia, unspecified: Secondary | ICD-10-CM | POA: Insufficient documentation

## 2023-01-12 DIAGNOSIS — M2550 Pain in unspecified joint: Secondary | ICD-10-CM | POA: Insufficient documentation

## 2023-01-12 DIAGNOSIS — M25511 Pain in right shoulder: Secondary | ICD-10-CM | POA: Diagnosis not present

## 2023-01-12 DIAGNOSIS — Z7902 Long term (current) use of antithrombotics/antiplatelets: Secondary | ICD-10-CM | POA: Insufficient documentation

## 2023-01-12 DIAGNOSIS — Z86718 Personal history of other venous thrombosis and embolism: Secondary | ICD-10-CM | POA: Insufficient documentation

## 2023-01-12 DIAGNOSIS — Z510 Encounter for antineoplastic radiation therapy: Secondary | ICD-10-CM | POA: Diagnosis not present

## 2023-01-12 DIAGNOSIS — Z5111 Encounter for antineoplastic chemotherapy: Secondary | ICD-10-CM | POA: Insufficient documentation

## 2023-01-12 DIAGNOSIS — F17210 Nicotine dependence, cigarettes, uncomplicated: Secondary | ICD-10-CM | POA: Diagnosis not present

## 2023-01-12 DIAGNOSIS — Z79630 Long term (current) use of alkylating agent: Secondary | ICD-10-CM | POA: Insufficient documentation

## 2023-01-12 DIAGNOSIS — M47812 Spondylosis without myelopathy or radiculopathy, cervical region: Secondary | ICD-10-CM | POA: Insufficient documentation

## 2023-01-12 DIAGNOSIS — M79601 Pain in right arm: Secondary | ICD-10-CM | POA: Diagnosis not present

## 2023-01-12 DIAGNOSIS — R5383 Other fatigue: Secondary | ICD-10-CM | POA: Insufficient documentation

## 2023-01-12 DIAGNOSIS — Z79633 Long term (current) use of mitotic inhibitor: Secondary | ICD-10-CM | POA: Insufficient documentation

## 2023-01-12 DIAGNOSIS — Z883 Allergy status to other anti-infective agents status: Secondary | ICD-10-CM | POA: Insufficient documentation

## 2023-01-12 DIAGNOSIS — Z9851 Tubal ligation status: Secondary | ICD-10-CM | POA: Insufficient documentation

## 2023-01-12 DIAGNOSIS — R590 Localized enlarged lymph nodes: Secondary | ICD-10-CM | POA: Insufficient documentation

## 2023-01-12 DIAGNOSIS — C3490 Malignant neoplasm of unspecified part of unspecified bronchus or lung: Secondary | ICD-10-CM

## 2023-01-12 DIAGNOSIS — Z79899 Other long term (current) drug therapy: Secondary | ICD-10-CM | POA: Insufficient documentation

## 2023-01-12 DIAGNOSIS — Z885 Allergy status to narcotic agent status: Secondary | ICD-10-CM | POA: Insufficient documentation

## 2023-01-12 DIAGNOSIS — Z881 Allergy status to other antibiotic agents status: Secondary | ICD-10-CM | POA: Insufficient documentation

## 2023-01-12 LAB — CBC WITH DIFFERENTIAL (CANCER CENTER ONLY)
Abs Immature Granulocytes: 0.01 10*3/uL (ref 0.00–0.07)
Basophils Absolute: 0 10*3/uL (ref 0.0–0.1)
Basophils Relative: 1 %
Eosinophils Absolute: 0 10*3/uL (ref 0.0–0.5)
Eosinophils Relative: 0 %
HCT: 45.1 % (ref 36.0–46.0)
Hemoglobin: 14.8 g/dL (ref 12.0–15.0)
Immature Granulocytes: 0 %
Lymphocytes Relative: 28 %
Lymphs Abs: 1.4 10*3/uL (ref 0.7–4.0)
MCH: 28.4 pg (ref 26.0–34.0)
MCHC: 32.8 g/dL (ref 30.0–36.0)
MCV: 86.4 fL (ref 80.0–100.0)
Monocytes Absolute: 0.4 10*3/uL (ref 0.1–1.0)
Monocytes Relative: 8 %
Neutro Abs: 3.1 10*3/uL (ref 1.7–7.7)
Neutrophils Relative %: 63 %
Platelet Count: 285 10*3/uL (ref 150–400)
RBC: 5.22 MIL/uL — ABNORMAL HIGH (ref 3.87–5.11)
RDW: 15.4 % (ref 11.5–15.5)
WBC Count: 4.9 10*3/uL (ref 4.0–10.5)
nRBC: 0 % (ref 0.0–0.2)

## 2023-01-12 LAB — CMP (CANCER CENTER ONLY)
ALT: 18 U/L (ref 0–44)
AST: 20 U/L (ref 15–41)
Albumin: 3.9 g/dL (ref 3.5–5.0)
Alkaline Phosphatase: 112 U/L (ref 38–126)
Anion gap: 4 — ABNORMAL LOW (ref 5–15)
BUN: 17 mg/dL (ref 8–23)
CO2: 30 mmol/L (ref 22–32)
Calcium: 9.1 mg/dL (ref 8.9–10.3)
Chloride: 108 mmol/L (ref 98–111)
Creatinine: 1.08 mg/dL — ABNORMAL HIGH (ref 0.44–1.00)
GFR, Estimated: 55 mL/min — ABNORMAL LOW (ref >60)
Glucose, Bld: 80 mg/dL (ref 70–99)
Potassium: 3.8 mmol/L (ref 3.5–5.1)
Sodium: 142 mmol/L (ref 135–145)
Total Bilirubin: 0.6 mg/dL (ref <1.2)
Total Protein: 6.8 g/dL (ref 6.5–8.1)

## 2023-01-12 MED ORDER — PROCHLORPERAZINE MALEATE 10 MG PO TABS: 10.0000 mg | ORAL_TABLET | Freq: Four times a day (QID) | ORAL | 1 refills | Status: DC | PRN

## 2023-01-12 NOTE — Progress Notes (Signed)
[facility] [phone]   [phone]  OFFICE PROGRESS NOTE  Aliene Beams, MD [phone] [address] [place] [address]  DIAGNOSIS: Unresectable stage IIb (T1c, N1, M0) non-small cell lung cancer, squamous cell carcinoma diagnosed in October 2024 and presented with left upper lobe lung nodule in addition to left hilar lymphadenopathy.  The patient not a good surgical candidate because of poor pulmonary function.  PRIOR THERAPY: None  CURRENT THERAPY: A course of concurrent chemoradiation with weekly carboplatin for AUC of 2 and paclitaxel 45 Mg/M2  INTERVAL HISTORY: [name] 72 y.o. female returns to the clinic today for follow-up visit accompanied by her daughter.Discussed the use of AI scribe software for clinical note transcription with the patient, who gave verbal consent to proceed.  History of Present Illness   Vicki Page, a 72 year old patient, was diagnosed with stage two lung cancer, deemed unresectable due to potential compromise of respiratory function. The patient reported feeling cold and discomfort in the right side of the body, including the arm, which has been ongoing for four weeks. The patient denied experiencing nausea, vomiting, or diarrhea. Breathing was reported as normal. The patient had a recent brain MRI, which did not show any metastasis. The patient expressed concerns about potential side effects, including hair loss and nausea.       MEDICAL HISTORY: Past Medical History:  Diagnosis Date   Adenomatous colon polyp    Aneurysm (HCC)    brain x 2   Anxiety    Arthritis    Atherosclerosis    Barrett's esophagus    Carotid artery disease (HCC)    Cataracts, bilateral    just watching   DDD (degenerative disc disease), lumbar    Depression    Diabetes mellitus without complication (HCC)    Diverticulosis    DVT (deep venous thrombosis) (HCC)    left leg   Fall at home 05/30/2014   Pt slipped  in tub- hurt left hip and right shoulder   GERD (gastroesophageal reflux disease)    H. pylori infection    Hyperlipidemia    Hypertension    Hypothyroidism    IBS (irritable bowel syndrome)    Osteoarthritis    Poor sleep 04/28/2020   Pre-diabetes    no meds, diet controlled   RLS (restless legs syndrome)    Tubular adenoma of colon    Ulcer of the stomach and intestine     ALLERGIES:  is allergic to fluconazole, ropinirole, codeine, darvon [propoxyphene hcl], and flagyl [metronidazole].  MEDICATIONS:  Current Outpatient Medications  Medication Sig Dispense Refill   acetaminophen (TYLENOL) 500 MG tablet Take 1,000 mg by mouth 2 (two) times daily as needed for pain.      amLODipine (NORVASC) 10 MG tablet Take 10 mg by mouth daily. for high blood pressure     aspirin EC 81 MG tablet Take 81 mg by mouth daily.     buPROPion (WELLBUTRIN SR) 150 MG 12 hr tablet Take 150 mg by mouth 2 (two) times daily.     Calcium-Vitamin D (CALTRATE 600 PLUS-VIT D PO) Take 1 tablet by mouth daily before supper.      Cholecalciferol (VITAMIN D3) 10 MCG (400 UNIT) CAPS Take 400 Units by mouth daily.     clopidogrel (PLAVIX) 75 MG tablet Take 1 tablet (75 mg total) by mouth daily with breakfast. Okay to restart on 11/22/2022.     LORazepam (ATIVAN) 0.5 MG tablet 1  tablet p.o. 30 minutes before MRI.  Repeat x 1 if needed 2 tablet 0   losartan (COZAAR) 50 MG tablet Take 75 mg by mouth daily.     Magnesium 200 MG TABS Take 200 mg by mouth daily.     Multiple Vitamin (MULTIVITAMIN WITH MINERALS) TABS tablet Take 1 tablet by mouth daily before supper. Centrum Silver     ondansetron (ZOFRAN) 8 MG tablet Take 8 mg by mouth every 8 (eight) hours as needed.     pantoprazole (PROTONIX) 40 MG tablet Take 1 tablet (40 mg total) by mouth daily. 30 tablet 2   Probiotic Product (PROBIOTIC PO) Take 1 tablet by mouth daily after lunch.      Psyllium (METAMUCIL) 28.3 % POWD Take 1 Dose by mouth daily. 2 teaspoons full = 1  dose     Tetrahydrozoline HCl (VISINE OP) Place 1 drop into both eyes daily as needed (irritation/dry eyes).     vitamin B-12 (CYANOCOBALAMIN) 1000 MCG tablet Take 1,000 mcg by mouth daily.     vitamin C (ASCORBIC ACID) 500 MG tablet Take 500 mg by mouth daily.     No current facility-administered medications for this visit.    SURGICAL HISTORY:  Past Surgical History:  Procedure Laterality Date   ANAL RECTAL MANOMETRY N/A 05/04/2016   Procedure: ANO RECTAL MANOMETRY;  Surgeon: Napoleon Form, MD;  Location: [facility] ENDOSCOPY;  Service: Endoscopy;  Laterality: N/A;   ANEURYSM COILING  01/28/2010   stent-assisted coiling LICA aneurysm   BRAIN SURGERY     Per patient " Left side behind eyes   BRONCHIAL BIOPSY  11/21/2022   Procedure: BRONCHIAL BIOPSIES;  Surgeon: Leslye Peer, MD;  Location: [facility] ENDOSCOPY;  Service: Pulmonary;;   BRONCHIAL BRUSHINGS  11/21/2022   Procedure: BRONCHIAL BRUSHINGS;  Surgeon: Leslye Peer, MD;  Location: [facility] ENDOSCOPY;  Service: Pulmonary;;   BRONCHIAL NEEDLE ASPIRATION BIOPSY  11/21/2022   Procedure: BRONCHIAL NEEDLE ASPIRATION BIOPSIES;  Surgeon: Leslye Peer, MD;  Location: [facility] ENDOSCOPY;  Service: Pulmonary;;   BRONCHIAL WASHINGS  11/21/2022   Procedure: BRONCHIAL WASHINGS;  Surgeon: Leslye Peer, MD;  Location: [facility] ENDOSCOPY;  Service: Pulmonary;;   FIDUCIAL MARKER PLACEMENT  11/21/2022   Procedure: FIDUCIAL MARKER PLACEMENT;  Surgeon: Leslye Peer, MD;  Location: [facility] ENDOSCOPY;  Service: Pulmonary;;   INSERTION OF ILIAC STENT  10/04/2012   Procedure: INSERTION OF ILIAC STENT;  Surgeon: Corky Crafts, MD;  Location: [facility] CATH LAB;  Service: Cardiovascular;;  Left External Iliac Artery   IR ANGIO INTRA EXTRACRAN SEL COM CAROTID INNOMINATE BILAT MOD SED  11/23/2018   IR ANGIO INTRA EXTRACRAN SEL COM CAROTID INNOMINATE BILAT MOD SED  12/03/2020   IR ANGIO VERTEBRAL SEL SUBCLAVIAN INNOMINATE UNI L MOD SED  11/23/2018   IR ANGIO VERTEBRAL SEL  VERTEBRAL BILAT MOD SED  12/03/2020   IR ANGIO VERTEBRAL SEL VERTEBRAL UNI L MOD SED  12/10/2018   IR ANGIO VERTEBRAL SEL VERTEBRAL UNI R MOD SED  11/23/2018   IR GENERIC HISTORICAL  11/03/2015   IR ANGIO VERTEBRAL SEL VERTEBRAL UNI L MOD SED 11/03/2015 Julieanne Cotton, MD [facility]   IR GENERIC HISTORICAL  11/03/2015   IR ANGIO INTRA EXTRACRAN SEL COM CAROTID INNOMINATE BILAT MOD SED 11/03/2015 Julieanne Cotton, MD [facility]   IR GENERIC HISTORICAL  11/03/2015   IR ANGIO VERTEBRAL SEL SUBCLAVIAN INNOMINATE UNI R MOD SED 11/03/2015 Julieanne Cotton, MD [facility]   IR GENERIC HISTORICAL  11/19/2015  IR RADIOLOGIST EVAL & MGMT 11/19/2015 [facility]   IR RADIOLOGIST EVAL & MGMT  02/15/2022   IR US GUIDE VASC ACCESS RIGHT  11/23/2018   IR US GUIDE VASC ACCESS RIGHT  12/03/2020   LEG SURGERY     LOWER EXTREMITY ANGIOGRAM Left 10/04/2012   and Left iliac stent   LOWER EXTREMITY ANGIOGRAM N/A 10/04/2012   Procedure: LOWER EXTREMITY ANGIOGRAM;  Surgeon: Corky Crafts, MD;  Location: [facility] CATH LAB;  Service: Cardiovascular;  Laterality: N/A;   RADIOLOGY WITH ANESTHESIA N/A 12/10/2018   Procedure: STENTING;  Surgeon: Julieanne Cotton, MD;  Location: [facility] OR;  Service: Radiology;  Laterality: N/A;   TUBAL LIGATION      REVIEW OF SYSTEMS:  Constitutional: positive for fatigue Eyes: negative Ears, nose, mouth, throat, and face: negative Respiratory: positive for cough Cardiovascular: negative Gastrointestinal: negative Genitourinary:negative Integument/breast: negative Hematologic/lymphatic: negative Musculoskeletal:negative Neurological: negative Behavioral/Psych: negative Endocrine: negative Allergic/Immunologic: negative   PHYSICAL EXAMINATION: General appearance: alert, cooperative, fatigued, and no distress Head: Normocephalic, without obvious abnormality, atraumatic Neck: no adenopathy, no JVD, supple, symmetrical, trachea midline, and thyroid not enlarged,  symmetric, no tenderness/mass/nodules Lymph nodes: Cervical, supraclavicular, and axillary nodes normal. Resp: clear to auscultation bilaterally Back: symmetric, no curvature. ROM normal. No CVA tenderness. Cardio: regular rate and rhythm, S1, S2 normal, no murmur, click, rub or gallop GI: soft, non-tender; bowel sounds normal; no masses,  no organomegaly Extremities: extremities normal, atraumatic, no cyanosis or edema Neurologic: Alert and oriented X 3, normal strength and tone. Normal symmetric reflexes. Normal coordination and gait  ECOG PERFORMANCE STATUS: 0 - Asymptomatic  Blood pressure (!) 139/58, pulse (!) 59, temperature 97.9 F (36.6 C), temperature source Temporal, resp. rate 17, height 5' 1.6" (1.565 m), weight 141 lb 8 oz (64.2 kg), SpO2 100%.  LABORATORY DATA: Lab Results  Component Value Date   WBC 4.9 01/12/2023   HGB 14.8 01/12/2023   HCT 45.1 01/12/2023   MCV 86.4 01/12/2023   PLT 285 01/12/2023      Chemistry      Component Value Date/Time   NA 142 12/15/2022 1419   K 4.1 12/15/2022 1419   CL 110 12/15/2022 1419   CO2 27 12/15/2022 1419   BUN 15 12/15/2022 1419   CREATININE 1.05 (H) 12/15/2022 1419      Component Value Date/Time   CALCIUM 9.1 12/15/2022 1419   ALKPHOS 93 12/15/2022 1419   AST 17 12/15/2022 1419   ALT 11 12/15/2022 1419   BILITOT 0.4 12/15/2022 1419       RADIOGRAPHIC STUDIES: MR BRAIN W WO CONTRAST  Addendum Date: 01/03/2023   ADDENDUM REPORT: 01/03/2023 09:13 ADDENDUM: 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 Electronically Signed   By: Jackey Loge D.O.   On: 01/03/2023 09:13   Result Date: 01/03/2023 CLINICAL DATA:  Provided history: Malignant neoplasm of unspecified part of unspecified bronchus or lung. Non-small cell lung cancer, staging. EXAM: MRI HEAD WITHOUT AND WITH CONTRAST TECHNIQUE: Multiplanar, multiecho pulse sequences of the brain and surrounding structures were obtained without and with  intravenous contrast. CONTRAST:  7mL GADAVIST GADOBUTROL 1 MMOL/ML IV SOLN COMPARISON:  CTA head 03/22/2022.  Brain MRI 07/25/2019. FINDINGS: Brain: Mild generalized parenchymal atrophy. Multifocal T2 FLAIR hyperintense signal abnormality within the cerebral white matter, nonspecific but compatible with moderate chronic small vessel ischemic disease. 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 Partially empty sella turcica again noted. There is no acute infarct. No  evidence of an intracranial mass. No chronic intracranial blood products. No extra-axial fluid collection. No midline shift. No pathologic intracranial enhancement identified. Vascular: Maintained flow voids within the proximal large arterial vessels. Skull and upper cervical spine: No focal worrisome marrow lesion. Incompletely assessed cervical spondylosis. Sinuses/Orbits: No mass or acute finding within the imaged orbits. Trace mucosal thickening scattered within the paranasal sinuses. IMPRESSION: 1. No evidence of intracranial metastatic disease. 2. Moderate chronic small vessel ischemic changes within the cerebral white matter, similar to the prior brain MRI of 07/25/2019. 3. 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 4. Mild generalized parenchymal atrophy. Electronically Signed: By: Jackey Loge D.O. On: 01/03/2023 09:02    ASSESSMENT AND PLAN: This is a 72 years old African-American female with Unresectable stage IIb (T1c, N1, M0) non-small cell lung cancer, squamous cell carcinoma diagnosed in October 2024 and presented with left upper lobe lung nodule in addition to left hilar lymphadenopathy.  The patient not a good surgical candidate because of poor pulmonary function.    Stage IIB (T1c, N1, M0) Unresectable Non-small cellLung Cancer, squamous cell carcinoma diagnosed in october of 2024 Stage II lung cancer diagnosed as unresectable due to  poor pulmonary function. Scheduled for six weeks of radiation therapy with concurrent weekly chemotherapy using carboplatin for AUC of 2 and paclitaxel 45 mg/m2 to sensitize the tumor. Emphasized the importance of regular blood count and chemistry checks. Follow-up scan planned post-treatment to assess effectiveness. - Administer carboplatin and paclitaxel chemotherapy weekly on Mondays for six weeks - Coordinate with radiation oncology for concurrent radiation therapy five days a week for six weeks - Provide anti-nausea medication - Monitor blood counts and chemistry at each chemotherapy session - Schedule a follow-up scan after completion of treatment to assess effectiveness - Enroll in a chemo education class - Start chemotherapy on January 23, 2023  Arthritis Reports right-sided body pain, particularly in the arm, ongoing for four weeks. Recently diagnosed with arthritis. - Manage arthritis symptoms as needed  General Health Maintenance General health maintenance was not the primary focus of this visit but is important for overall well-being. - Ensure all appointments and treatments are documented in MyChart for access  Follow-up - Schedule follow-up appointments and scans post-treatment - Print out a calendar with all chemo, doctor, and radiation appointments.   The patient was advised to call immediately if she has any other concerning symptoms in the interval. The patient voices understanding of current disease status and treatment options and is in agreement with the current care plan.  All questions were answered. The patient knows to call the clinic with any problems, questions or concerns. We can certainly see the patient much sooner if necessary.  The total time spent in the appointment was 30 minutes.  Disclaimer: This note was dictated with voice recognition software. Similar sounding words can inadvertently be transcribed and may not be corrected upon review.

## 2023-01-12 NOTE — Progress Notes (Signed)
Today was the pt's follow up appointment to determine her treatment plan. Pt had her CT simulation this morning. The plan is for the pt to receive concurrent chemoradiation for 6 weeks starting 12/16. Pt is aware that she will require a chemotherapy education class. Pt's daughter, [name], will be providing the transportation for the pt for her appts, and wanted to inquire about access to the gas cards. I let Dianna know I would reach out to Ronda Fairly, LCSW, who can provide assistance with this. All questions answered.

## 2023-01-12 NOTE — Progress Notes (Signed)
START ON PATHWAY REGIMEN - Non-Small Cell Lung     A cycle is every 7 days, concurrent with RT:     Paclitaxel      Carboplatin   **Always confirm dose/schedule in your pharmacy ordering system**  Patient Characteristics: Preoperative or Nonsurgical Candidate (Clinical Staging), Stage II, Nonsurgical Candidate Therapeutic Status: Preoperative or Nonsurgical Candidate (Clinical Staging) AJCC T Category: cT1c AJCC N Category: cN1 AJCC M Category: cM0 AJCC 8 Stage Grouping: IIB Intent of Therapy: Curative Intent, Discussed with Patient

## 2023-01-13 ENCOUNTER — Other Ambulatory Visit: Payer: Self-pay

## 2023-01-13 ENCOUNTER — Inpatient Hospital Stay: Payer: Medicare Other | Admitting: Licensed Clinical Social Worker

## 2023-01-13 DIAGNOSIS — C3412 Malignant neoplasm of upper lobe, left bronchus or lung: Secondary | ICD-10-CM

## 2023-01-13 NOTE — Progress Notes (Unsigned)
[facility] Clinical Social Work  Initial Assessment   KAWANIS BONETTI is a 72 y.o. year old female pt's daughter contacted by phone. Clinical Social Work was referred by medical provider for assessment of psychosocial needs.   SDOH (Social Determinants of Health) assessments performed: Yes   SDOH Screenings   Food Insecurity: No Food Insecurity (12/19/2022)  Housing: Low Risk  (12/19/2022)  Transportation Needs: No Transportation Needs (12/19/2022)  Utilities: Not At Risk (12/19/2022)  Depression (PHQ2-9): Medium Risk (12/19/2022)  Tobacco Use: High Risk (12/12/2022)     Distress Screen completed: No     No data to display            Family/Social Information:  Housing Arrangement: {[facility] housing:27476}*** Family members/support persons in your life? {[facility] support people:27477} Transportation concerns: {YES/NO/WILD ZOXWR:60454}  Employment: {Hx; Work history:210120514} ***.  Income source: {Source of Income:[phone]} Financial concerns: {Financial concern yes/no:24779} Type of concern: {Financial concern types:24780} Food access concerns: {YES/NO/WILD UJWJX:91478} Religious or spiritual practice: {[facility] AMB YES/NO/NOT KNOWN 2:[id number]} Services Currently in place:  ***  Coping/ Adjustment to diagnosis: Patient understands treatment plan and what happens next? {YES/NO/WILD VHQIO:96295} Concerns about diagnosis and/or treatment: {[facility] worries:27479} Patient reported stressors: {[facility] Pt Stressors:24156} Hopes and/or priorities: *** Patient enjoys {Hobbies:24782} Current coping skills/ strengths: {PATIENT STRENGTHS:22666:a}    SUMMARY: Current SDOH Barriers:  {[facility] SW BARRIERS:22256}  Clinical Social Work Clinical Goal(s):  {[facility] SW goals:27480}  Interventions: Discussed common feeling and emotions when being diagnosed with cancer, and the importance of support during treatment Informed patient of the support team roles and support services at [facility] Provided CSW contact  information and encouraged patient to call with any questions or concerns {CSW interventions:24889}   Follow Up Plan: {CSW Follow MW:41324} Patient verbalizes understanding of plan: {yes/no:20286}    Rachel Moulds, LCSW Clinical Social Worker [facility]  Patient is participating in a Managed Medicaid Plan:  {MM YES/NO:27447::"Yes"}

## 2023-01-13 NOTE — Progress Notes (Signed)
I reached out to the pt first, call went right to VM. LVM requesting she call me back regarding scheduling a chemo education appt.  I then reached out to pts dtr, Dianna. No answer. LVM requesting she call me back to schedule an education appt for the pt.

## 2023-01-17 ENCOUNTER — Encounter: Payer: Self-pay | Admitting: Internal Medicine

## 2023-01-17 DIAGNOSIS — R5383 Other fatigue: Secondary | ICD-10-CM | POA: Diagnosis not present

## 2023-01-17 DIAGNOSIS — Z79630 Long term (current) use of alkylating agent: Secondary | ICD-10-CM | POA: Diagnosis not present

## 2023-01-17 DIAGNOSIS — Z510 Encounter for antineoplastic radiation therapy: Secondary | ICD-10-CM | POA: Diagnosis not present

## 2023-01-17 DIAGNOSIS — Z7902 Long term (current) use of antithrombotics/antiplatelets: Secondary | ICD-10-CM | POA: Diagnosis not present

## 2023-01-17 DIAGNOSIS — F17210 Nicotine dependence, cigarettes, uncomplicated: Secondary | ICD-10-CM | POA: Diagnosis not present

## 2023-01-17 DIAGNOSIS — M2550 Pain in unspecified joint: Secondary | ICD-10-CM | POA: Diagnosis not present

## 2023-01-17 DIAGNOSIS — Z79633 Long term (current) use of mitotic inhibitor: Secondary | ICD-10-CM | POA: Diagnosis not present

## 2023-01-17 DIAGNOSIS — M47812 Spondylosis without myelopathy or radiculopathy, cervical region: Secondary | ICD-10-CM | POA: Diagnosis not present

## 2023-01-17 DIAGNOSIS — E785 Hyperlipidemia, unspecified: Secondary | ICD-10-CM | POA: Diagnosis not present

## 2023-01-17 DIAGNOSIS — R590 Localized enlarged lymph nodes: Secondary | ICD-10-CM | POA: Diagnosis not present

## 2023-01-17 DIAGNOSIS — Z5111 Encounter for antineoplastic chemotherapy: Secondary | ICD-10-CM | POA: Diagnosis not present

## 2023-01-17 DIAGNOSIS — Z79899 Other long term (current) drug therapy: Secondary | ICD-10-CM | POA: Diagnosis not present

## 2023-01-17 DIAGNOSIS — Z86718 Personal history of other venous thrombosis and embolism: Secondary | ICD-10-CM | POA: Diagnosis not present

## 2023-01-17 DIAGNOSIS — C3412 Malignant neoplasm of upper lobe, left bronchus or lung: Secondary | ICD-10-CM | POA: Diagnosis not present

## 2023-01-17 DIAGNOSIS — M25511 Pain in right shoulder: Secondary | ICD-10-CM | POA: Diagnosis not present

## 2023-01-17 DIAGNOSIS — M79601 Pain in right arm: Secondary | ICD-10-CM | POA: Diagnosis not present

## 2023-01-18 ENCOUNTER — Other Ambulatory Visit: Payer: Self-pay | Admitting: Internal Medicine

## 2023-01-18 ENCOUNTER — Encounter: Payer: Self-pay | Admitting: Internal Medicine

## 2023-01-18 ENCOUNTER — Telehealth: Payer: Self-pay | Admitting: Licensed Clinical Social Worker

## 2023-01-18 DIAGNOSIS — C3412 Malignant neoplasm of upper lobe, left bronchus or lung: Secondary | ICD-10-CM

## 2023-01-18 NOTE — Telephone Encounter (Signed)
CSW received a voicemail from pt inquiring about applying for food stamps. CSW called pt back and left multiple voicemails; however, pt does not seem to be getting the voicemails. CSW contacted pt's daughter to relay information regarding food stamps.

## 2023-01-19 ENCOUNTER — Ambulatory Visit: Payer: Medicare Other

## 2023-01-19 ENCOUNTER — Telehealth: Payer: Self-pay

## 2023-01-19 ENCOUNTER — Ambulatory Visit
Admission: RE | Admit: 2023-01-19 | Discharge: 2023-01-19 | Disposition: A | Discharge status: Home / Self Care | Payer: Medicare Other | Source: Ambulatory Visit | Attending: Radiation Oncology | Admitting: Radiation Oncology

## 2023-01-19 ENCOUNTER — Inpatient Hospital Stay: Payer: Medicare Other

## 2023-01-19 ENCOUNTER — Other Ambulatory Visit: Payer: Self-pay

## 2023-01-19 DIAGNOSIS — M2550 Pain in unspecified joint: Secondary | ICD-10-CM | POA: Diagnosis not present

## 2023-01-19 DIAGNOSIS — R5383 Other fatigue: Secondary | ICD-10-CM | POA: Diagnosis not present

## 2023-01-19 DIAGNOSIS — Z5111 Encounter for antineoplastic chemotherapy: Secondary | ICD-10-CM | POA: Diagnosis not present

## 2023-01-19 DIAGNOSIS — M47812 Spondylosis without myelopathy or radiculopathy, cervical region: Secondary | ICD-10-CM | POA: Diagnosis not present

## 2023-01-19 DIAGNOSIS — M79601 Pain in right arm: Secondary | ICD-10-CM | POA: Diagnosis not present

## 2023-01-19 DIAGNOSIS — C3412 Malignant neoplasm of upper lobe, left bronchus or lung: Secondary | ICD-10-CM

## 2023-01-19 DIAGNOSIS — Z86718 Personal history of other venous thrombosis and embolism: Secondary | ICD-10-CM | POA: Diagnosis not present

## 2023-01-19 DIAGNOSIS — R590 Localized enlarged lymph nodes: Secondary | ICD-10-CM | POA: Diagnosis not present

## 2023-01-19 DIAGNOSIS — Z79899 Other long term (current) drug therapy: Secondary | ICD-10-CM | POA: Diagnosis not present

## 2023-01-19 DIAGNOSIS — Z510 Encounter for antineoplastic radiation therapy: Secondary | ICD-10-CM | POA: Diagnosis not present

## 2023-01-19 DIAGNOSIS — Z79633 Long term (current) use of mitotic inhibitor: Secondary | ICD-10-CM | POA: Diagnosis not present

## 2023-01-19 DIAGNOSIS — F17210 Nicotine dependence, cigarettes, uncomplicated: Secondary | ICD-10-CM | POA: Diagnosis not present

## 2023-01-19 DIAGNOSIS — E785 Hyperlipidemia, unspecified: Secondary | ICD-10-CM | POA: Diagnosis not present

## 2023-01-19 DIAGNOSIS — Z7902 Long term (current) use of antithrombotics/antiplatelets: Secondary | ICD-10-CM | POA: Diagnosis not present

## 2023-01-19 DIAGNOSIS — Z79630 Long term (current) use of alkylating agent: Secondary | ICD-10-CM | POA: Diagnosis not present

## 2023-01-19 DIAGNOSIS — M25511 Pain in right shoulder: Secondary | ICD-10-CM | POA: Diagnosis not present

## 2023-01-19 LAB — RAD ONC ARIA SESSION SUMMARY
Course Elapsed Days: 0
Plan Fractions Treated to Date: 1
Plan Prescribed Dose Per Fraction: 2 Gy
Plan Total Fractions Prescribed: 30
Plan Total Prescribed Dose: 60 Gy
Reference Point Dosage Given to Date: 2 Gy
Reference Point Session Dosage Given: 2 Gy
Session Number: 1

## 2023-01-19 NOTE — Telephone Encounter (Signed)
Ok thank you. I will try to call her

## 2023-01-19 NOTE — Telephone Encounter (Signed)
Pt called and LVM asking about appointment times today, RN attempted to call back, no answer, LVM and appointment times/directions.

## 2023-01-20 ENCOUNTER — Other Ambulatory Visit: Payer: Self-pay

## 2023-01-20 ENCOUNTER — Ambulatory Visit
Admission: RE | Admit: 2023-01-20 | Discharge: 2023-01-20 | Disposition: A | Discharge status: Home / Self Care | Payer: Medicare Other | Source: Ambulatory Visit | Attending: Radiation Oncology | Admitting: Radiation Oncology

## 2023-01-20 DIAGNOSIS — Z79899 Other long term (current) drug therapy: Secondary | ICD-10-CM | POA: Diagnosis not present

## 2023-01-20 DIAGNOSIS — C3412 Malignant neoplasm of upper lobe, left bronchus or lung: Secondary | ICD-10-CM | POA: Diagnosis not present

## 2023-01-20 DIAGNOSIS — R5383 Other fatigue: Secondary | ICD-10-CM | POA: Diagnosis not present

## 2023-01-20 DIAGNOSIS — M2550 Pain in unspecified joint: Secondary | ICD-10-CM | POA: Diagnosis not present

## 2023-01-20 DIAGNOSIS — F17210 Nicotine dependence, cigarettes, uncomplicated: Secondary | ICD-10-CM | POA: Diagnosis not present

## 2023-01-20 DIAGNOSIS — Z5111 Encounter for antineoplastic chemotherapy: Secondary | ICD-10-CM | POA: Diagnosis not present

## 2023-01-20 DIAGNOSIS — E785 Hyperlipidemia, unspecified: Secondary | ICD-10-CM | POA: Diagnosis not present

## 2023-01-20 DIAGNOSIS — Z86718 Personal history of other venous thrombosis and embolism: Secondary | ICD-10-CM | POA: Diagnosis not present

## 2023-01-20 DIAGNOSIS — R590 Localized enlarged lymph nodes: Secondary | ICD-10-CM | POA: Diagnosis not present

## 2023-01-20 DIAGNOSIS — M47812 Spondylosis without myelopathy or radiculopathy, cervical region: Secondary | ICD-10-CM | POA: Diagnosis not present

## 2023-01-20 DIAGNOSIS — Z79630 Long term (current) use of alkylating agent: Secondary | ICD-10-CM | POA: Diagnosis not present

## 2023-01-20 DIAGNOSIS — M79601 Pain in right arm: Secondary | ICD-10-CM | POA: Diagnosis not present

## 2023-01-20 DIAGNOSIS — Z510 Encounter for antineoplastic radiation therapy: Secondary | ICD-10-CM | POA: Diagnosis not present

## 2023-01-20 DIAGNOSIS — Z79633 Long term (current) use of mitotic inhibitor: Secondary | ICD-10-CM | POA: Diagnosis not present

## 2023-01-20 DIAGNOSIS — M25511 Pain in right shoulder: Secondary | ICD-10-CM | POA: Diagnosis not present

## 2023-01-20 DIAGNOSIS — Z7902 Long term (current) use of antithrombotics/antiplatelets: Secondary | ICD-10-CM | POA: Diagnosis not present

## 2023-01-20 LAB — RAD ONC ARIA SESSION SUMMARY
Course Elapsed Days: 1
Plan Fractions Treated to Date: 2
Plan Prescribed Dose Per Fraction: 2 Gy
Plan Total Fractions Prescribed: 30
Plan Total Prescribed Dose: 60 Gy
Reference Point Dosage Given to Date: 4 Gy
Reference Point Session Dosage Given: 2 Gy
Session Number: 2

## 2023-01-23 ENCOUNTER — Other Ambulatory Visit: Payer: Self-pay

## 2023-01-23 ENCOUNTER — Inpatient Hospital Stay: Payer: Medicare Other

## 2023-01-23 ENCOUNTER — Telehealth: Payer: Self-pay | Admitting: Medical Oncology

## 2023-01-23 ENCOUNTER — Ambulatory Visit
Admission: RE | Admit: 2023-01-23 | Discharge: 2023-01-23 | Disposition: A | Discharge status: Home / Self Care | Payer: Medicare Other | Source: Ambulatory Visit | Attending: Radiation Oncology | Admitting: Radiation Oncology

## 2023-01-23 ENCOUNTER — Ambulatory Visit: Payer: Medicare Other

## 2023-01-23 DIAGNOSIS — M2550 Pain in unspecified joint: Secondary | ICD-10-CM | POA: Diagnosis not present

## 2023-01-23 DIAGNOSIS — M79601 Pain in right arm: Secondary | ICD-10-CM | POA: Diagnosis not present

## 2023-01-23 DIAGNOSIS — Z79899 Other long term (current) drug therapy: Secondary | ICD-10-CM | POA: Diagnosis not present

## 2023-01-23 DIAGNOSIS — C3412 Malignant neoplasm of upper lobe, left bronchus or lung: Secondary | ICD-10-CM

## 2023-01-23 DIAGNOSIS — Z79630 Long term (current) use of alkylating agent: Secondary | ICD-10-CM | POA: Diagnosis not present

## 2023-01-23 DIAGNOSIS — R590 Localized enlarged lymph nodes: Secondary | ICD-10-CM | POA: Diagnosis not present

## 2023-01-23 DIAGNOSIS — Z86718 Personal history of other venous thrombosis and embolism: Secondary | ICD-10-CM | POA: Diagnosis not present

## 2023-01-23 DIAGNOSIS — Z79633 Long term (current) use of mitotic inhibitor: Secondary | ICD-10-CM | POA: Diagnosis not present

## 2023-01-23 DIAGNOSIS — Z7902 Long term (current) use of antithrombotics/antiplatelets: Secondary | ICD-10-CM | POA: Diagnosis not present

## 2023-01-23 DIAGNOSIS — M25511 Pain in right shoulder: Secondary | ICD-10-CM | POA: Diagnosis not present

## 2023-01-23 DIAGNOSIS — Z510 Encounter for antineoplastic radiation therapy: Secondary | ICD-10-CM | POA: Diagnosis not present

## 2023-01-23 DIAGNOSIS — F17210 Nicotine dependence, cigarettes, uncomplicated: Secondary | ICD-10-CM | POA: Diagnosis not present

## 2023-01-23 DIAGNOSIS — E785 Hyperlipidemia, unspecified: Secondary | ICD-10-CM | POA: Diagnosis not present

## 2023-01-23 DIAGNOSIS — Z5111 Encounter for antineoplastic chemotherapy: Secondary | ICD-10-CM | POA: Diagnosis not present

## 2023-01-23 DIAGNOSIS — M47812 Spondylosis without myelopathy or radiculopathy, cervical region: Secondary | ICD-10-CM | POA: Diagnosis not present

## 2023-01-23 DIAGNOSIS — R5383 Other fatigue: Secondary | ICD-10-CM | POA: Diagnosis not present

## 2023-01-23 LAB — RAD ONC ARIA SESSION SUMMARY
Course Elapsed Days: 4
Plan Fractions Treated to Date: 3
Plan Prescribed Dose Per Fraction: 2 Gy
Plan Total Fractions Prescribed: 30
Plan Total Prescribed Dose: 60 Gy
Reference Point Dosage Given to Date: 6 Gy
Reference Point Session Dosage Given: 2 Gy
Session Number: 3

## 2023-01-23 LAB — CMP (CANCER CENTER ONLY)
ALT: 16 U/L (ref 0–44)
AST: 19 U/L (ref 15–41)
Albumin: 3.9 g/dL (ref 3.5–5.0)
Alkaline Phosphatase: 105 U/L (ref 38–126)
Anion gap: 4 — ABNORMAL LOW (ref 5–15)
BUN: 18 mg/dL (ref 8–23)
CO2: 29 mmol/L (ref 22–32)
Calcium: 9.3 mg/dL (ref 8.9–10.3)
Chloride: 107 mmol/L (ref 98–111)
Creatinine: 1.11 mg/dL — ABNORMAL HIGH (ref 0.44–1.00)
GFR, Estimated: 53 mL/min — ABNORMAL LOW (ref >60)
Glucose, Bld: 69 mg/dL — ABNORMAL LOW (ref 70–99)
Potassium: 4.3 mmol/L (ref 3.5–5.1)
Sodium: 140 mmol/L (ref 135–145)
Total Bilirubin: 0.6 mg/dL (ref <1.2)
Total Protein: 6.8 g/dL (ref 6.5–8.1)

## 2023-01-23 LAB — CBC WITH DIFFERENTIAL (CANCER CENTER ONLY)
Abs Immature Granulocytes: 0.01 10*3/uL (ref 0.00–0.07)
Basophils Absolute: 0 10*3/uL (ref 0.0–0.1)
Basophils Relative: 1 %
Eosinophils Absolute: 0 10*3/uL (ref 0.0–0.5)
Eosinophils Relative: 0 %
HCT: 44.2 % (ref 36.0–46.0)
Hemoglobin: 14.3 g/dL (ref 12.0–15.0)
Immature Granulocytes: 0 %
Lymphocytes Relative: 31 %
Lymphs Abs: 1.6 10*3/uL (ref 0.7–4.0)
MCH: 28 pg (ref 26.0–34.0)
MCHC: 32.4 g/dL (ref 30.0–36.0)
MCV: 86.5 fL (ref 80.0–100.0)
Monocytes Absolute: 0.4 10*3/uL (ref 0.1–1.0)
Monocytes Relative: 8 %
Neutro Abs: 3.1 10*3/uL (ref 1.7–7.7)
Neutrophils Relative %: 60 %
Platelet Count: 274 10*3/uL (ref 150–400)
RBC: 5.11 MIL/uL (ref 3.87–5.11)
RDW: 15.6 % — ABNORMAL HIGH (ref 11.5–15.5)
WBC Count: 5.1 10*3/uL (ref 4.0–10.5)
nRBC: 0 % (ref 0.0–0.2)

## 2023-01-23 NOTE — Telephone Encounter (Signed)
LVM that Dr. Arbutus Ped wants you to start tomorrow and I will see her to go over the rest of her schedule.

## 2023-01-24 ENCOUNTER — Encounter: Payer: Self-pay | Admitting: Internal Medicine

## 2023-01-24 ENCOUNTER — Other Ambulatory Visit: Payer: Self-pay

## 2023-01-24 ENCOUNTER — Inpatient Hospital Stay: Payer: Medicare Other

## 2023-01-24 ENCOUNTER — Ambulatory Visit
Admission: RE | Admit: 2023-01-24 | Discharge: 2023-01-24 | Disposition: A | Discharge status: Home / Self Care | Payer: Medicare Other | Source: Ambulatory Visit | Attending: Radiation Oncology | Admitting: Radiation Oncology

## 2023-01-24 ENCOUNTER — Inpatient Hospital Stay: Payer: Medicare Other | Admitting: Licensed Clinical Social Worker

## 2023-01-24 VITALS — BP 139/58 | HR 66 | Temp 98.6°F | Resp 18 | Wt 143.5 lb

## 2023-01-24 DIAGNOSIS — M25511 Pain in right shoulder: Secondary | ICD-10-CM | POA: Diagnosis not present

## 2023-01-24 DIAGNOSIS — Z7902 Long term (current) use of antithrombotics/antiplatelets: Secondary | ICD-10-CM | POA: Diagnosis not present

## 2023-01-24 DIAGNOSIS — C3412 Malignant neoplasm of upper lobe, left bronchus or lung: Secondary | ICD-10-CM | POA: Diagnosis not present

## 2023-01-24 DIAGNOSIS — R590 Localized enlarged lymph nodes: Secondary | ICD-10-CM | POA: Diagnosis not present

## 2023-01-24 DIAGNOSIS — E785 Hyperlipidemia, unspecified: Secondary | ICD-10-CM | POA: Diagnosis not present

## 2023-01-24 DIAGNOSIS — M47812 Spondylosis without myelopathy or radiculopathy, cervical region: Secondary | ICD-10-CM | POA: Diagnosis not present

## 2023-01-24 DIAGNOSIS — R5383 Other fatigue: Secondary | ICD-10-CM | POA: Diagnosis not present

## 2023-01-24 DIAGNOSIS — Z510 Encounter for antineoplastic radiation therapy: Secondary | ICD-10-CM | POA: Diagnosis not present

## 2023-01-24 DIAGNOSIS — M79601 Pain in right arm: Secondary | ICD-10-CM | POA: Diagnosis not present

## 2023-01-24 DIAGNOSIS — Z86718 Personal history of other venous thrombosis and embolism: Secondary | ICD-10-CM | POA: Diagnosis not present

## 2023-01-24 DIAGNOSIS — M2550 Pain in unspecified joint: Secondary | ICD-10-CM | POA: Diagnosis not present

## 2023-01-24 DIAGNOSIS — Z79633 Long term (current) use of mitotic inhibitor: Secondary | ICD-10-CM | POA: Diagnosis not present

## 2023-01-24 DIAGNOSIS — Z79899 Other long term (current) drug therapy: Secondary | ICD-10-CM | POA: Diagnosis not present

## 2023-01-24 DIAGNOSIS — F17210 Nicotine dependence, cigarettes, uncomplicated: Secondary | ICD-10-CM | POA: Diagnosis not present

## 2023-01-24 DIAGNOSIS — Z79630 Long term (current) use of alkylating agent: Secondary | ICD-10-CM | POA: Diagnosis not present

## 2023-01-24 DIAGNOSIS — Z5111 Encounter for antineoplastic chemotherapy: Secondary | ICD-10-CM | POA: Diagnosis not present

## 2023-01-24 LAB — RAD ONC ARIA SESSION SUMMARY
Course Elapsed Days: 5
Plan Fractions Treated to Date: 4
Plan Prescribed Dose Per Fraction: 2 Gy
Plan Total Fractions Prescribed: 30
Plan Total Prescribed Dose: 60 Gy
Reference Point Dosage Given to Date: 8 Gy
Reference Point Session Dosage Given: 2 Gy
Session Number: 4

## 2023-01-24 MED ORDER — FAMOTIDINE IN NACL 20-0.9 MG/50ML-% IV SOLN
20.0000 mg | Freq: Once | INTRAVENOUS | Status: AC
  Administered 2023-01-24: 20 mg via INTRAVENOUS
  Filled 2023-01-24: qty 50

## 2023-01-24 MED ORDER — SONAFINE EX EMUL
1.0000 | Freq: Once | CUTANEOUS | Status: AC
Start: 2023-01-24 — End: 2023-01-24
  Administered 2023-01-24: 1 via TOPICAL

## 2023-01-24 MED ORDER — DEXAMETHASONE SODIUM PHOSPHATE 10 MG/ML IJ SOLN
10.0000 mg | Freq: Once | INTRAMUSCULAR | Status: AC
  Administered 2023-01-24: 10 mg via INTRAVENOUS
  Filled 2023-01-24: qty 1

## 2023-01-24 MED ORDER — SODIUM CHLORIDE 0.9 % IV SOLN
145.4000 mg | Freq: Once | INTRAVENOUS | Status: AC
  Administered 2023-01-24: 150 mg via INTRAVENOUS
  Filled 2023-01-24: qty 15

## 2023-01-24 MED ORDER — SODIUM CHLORIDE 0.9 % IV SOLN: INTRAVENOUS | Status: DC

## 2023-01-24 MED ORDER — PACLITAXEL CHEMO INJECTION 300 MG/50ML
45.0000 mg/m2 | Freq: Once | INTRAVENOUS | Status: AC
  Administered 2023-01-24: 78 mg via INTRAVENOUS
  Filled 2023-01-24: qty 13

## 2023-01-24 MED ORDER — PALONOSETRON HCL INJECTION 0.25 MG/5ML
0.2500 mg | Freq: Once | INTRAVENOUS | Status: AC
  Administered 2023-01-24: 0.25 mg via INTRAVENOUS
  Filled 2023-01-24: qty 5

## 2023-01-24 MED ORDER — DIPHENHYDRAMINE HCL 50 MG/ML IJ SOLN
50.0000 mg | Freq: Once | INTRAMUSCULAR | Status: AC
  Administered 2023-01-24: 50 mg via INTRAVENOUS
  Filled 2023-01-24: qty 1

## 2023-01-24 NOTE — Progress Notes (Signed)
Pt's daughter [name] called regarding the [employer].  Pt would like to apply so I discussed what's needed to apply, requesting her mother's proof of income and SNAP benefits.  She was annoyed with this request so I apologized and let her know according to the policy income documentation is required to apply for the grant.  I also emailed the pt an expense sheet.

## 2023-01-24 NOTE — Patient Instructions (Signed)
[facility] - A DEPT OF [facility]  Discharge Instructions: Thank you for choosing [facility] to provide your oncology and hematology care.   If you have a lab appointment with the Cancer Center, please go directly to the Cancer Center and check in at the registration area.   Wear comfortable clothing and clothing appropriate for easy access to any Portacath or PICC line.   We strive to give you quality time with your provider. You may need to reschedule your appointment if you arrive late (15 or more minutes).  Arriving late affects you and other patients whose appointments are after yours.  Also, if you miss three or more appointments without notifying the office, you may be dismissed from the clinic at the provider's discretion.      For prescription refill requests, have your pharmacy contact our office and allow 72 hours for refills to be completed.    Today you received the following chemotherapy and/or immunotherapy agents: Paclitaxel, Carboplatin      To help prevent nausea and vomiting after your treatment, we encourage you to take your nausea medication as directed.  BELOW ARE SYMPTOMS THAT SHOULD BE REPORTED IMMEDIATELY: *FEVER GREATER THAN 100.4 F (38 C) OR HIGHER *CHILLS OR SWEATING *NAUSEA AND VOMITING THAT IS NOT CONTROLLED WITH YOUR NAUSEA MEDICATION *UNUSUAL SHORTNESS OF BREATH *UNUSUAL BRUISING OR BLEEDING *URINARY PROBLEMS (pain or burning when urinating, or frequent urination) *BOWEL PROBLEMS (unusual diarrhea, constipation, pain near the anus) TENDERNESS IN MOUTH AND THROAT WITH OR WITHOUT PRESENCE OF ULCERS (sore throat, sores in mouth, or a toothache) UNUSUAL RASH, SWELLING OR PAIN  UNUSUAL VAGINAL DISCHARGE OR ITCHING   Items with * indicate a potential emergency and should be followed up as soon as possible or go to the Emergency Department if any problems should occur.  Please show the CHEMOTHERAPY ALERT CARD or  IMMUNOTHERAPY ALERT CARD at check-in to the Emergency Department and triage nurse.  Should you have questions after your visit or need to cancel or reschedule your appointment, please contact [facility] - A DEPT OF [facility]  Dept: [phone]  and follow the prompts.  Office hours are 8:00 a.m. to 4:30 p.m. Monday - Friday. Please note that voicemails left after 4:00 p.m. may not be returned until the following business day.  We are closed weekends and major holidays. You have access to a nurse at all times for urgent questions. Please call the main number to the clinic Dept: [phone] and follow the prompts.   For any non-urgent questions, you may also contact your provider using MyChart. We now offer e-Visits for anyone 33 and older to request care online for non-urgent symptoms. For details visit [email or web address].   Also download the MyChart app! Go to the app store, search "MyChart", open the app, select [facility], and log in with your MyChart username and password.  Paclitaxel Injection What is this medication? PACLITAXEL (PAK li TAX el) treats some types of cancer. It works by slowing down the growth of cancer cells. This medicine may be used for other purposes; ask your health care provider or pharmacist if you have questions. COMMON BRAND NAME(S): Onxol, Taxol What should I tell my care team before I take this medication? They need to know if you have any of these conditions: Heart disease Liver disease Low white blood cell levels An unusual or allergic reaction to paclitaxel, other medications,  foods, dyes, or preservatives If you or your partner are pregnant or trying to get pregnant Breast-feeding How should I use this medication? This medication is injected into a vein. It is given by your care team in a hospital or clinic setting. Talk to your care team about the use of this medication in children. While it may be given to children  for selected conditions, precautions do apply. Overdosage: If you think you have taken too much of this medicine contact a poison control center or emergency room at once. NOTE: This medicine is only for you. Do not share this medicine with others. What if I miss a dose? Keep appointments for follow-up doses. It is important not to miss your dose. Call your care team if you are unable to keep an appointment. What may interact with this medication? Do not take this medication with any of the following: Live virus vaccines Other medications may affect the way this medication works. Talk with your care team about all of the medications you take. They may suggest changes to your treatment plan to lower the risk of side effects and to make sure your medications work as intended. This list may not describe all possible interactions. Give your health care provider a list of all the medicines, herbs, non-prescription drugs, or dietary supplements you use. Also tell them if you smoke, drink alcohol, or use illegal drugs. Some items may interact with your medicine. What should I watch for while using this medication? Your condition will be monitored carefully while you are receiving this medication. You may need blood work while taking this medication. This medication may make you feel generally unwell. This is not uncommon as chemotherapy can affect healthy cells as well as cancer cells. Report any side effects. Continue your course of treatment even though you feel ill unless your care team tells you to stop. This medication can cause serious allergic reactions. To reduce the risk, your care team may give you other medications to take before receiving this one. Be sure to follow the directions from your care team. This medication may increase your risk of getting an infection. Call your care team for advice if you get a fever, chills, sore throat, or other symptoms of a cold or flu. Do not treat yourself. Try  to avoid being around people who are sick. This medication may increase your risk to bruise or bleed. Call your care team if you notice any unusual bleeding. Be careful brushing or flossing your teeth or using a toothpick because you may get an infection or bleed more easily. If you have any dental work done, tell your dentist you are receiving this medication. Talk to your care team if you may be pregnant. Serious birth defects can occur if you take this medication during pregnancy. Talk to your care team before breastfeeding. Changes to your treatment plan may be needed. What side effects may I notice from receiving this medication? Side effects that you should report to your care team as soon as possible: Allergic reactions--skin rash, itching, hives, swelling of the face, lips, tongue, or throat Heart rhythm changes--fast or irregular heartbeat, dizziness, feeling faint or lightheaded, chest pain, trouble breathing Increase in blood pressure Infection--fever, chills, cough, sore throat, wounds that don't heal, pain or trouble when passing urine, general feeling of discomfort or being unwell Low blood pressure--dizziness, feeling faint or lightheaded, blurry vision Low red blood cell level--unusual weakness or fatigue, dizziness, headache, trouble breathing Painful swelling, warmth, or redness  of the skin, blisters or sores at the infusion site Pain, tingling, or numbness in the hands or feet Slow heartbeat--dizziness, feeling faint or lightheaded, confusion, trouble breathing, unusual weakness or fatigue Unusual bruising or bleeding Side effects that usually do not require medical attention (report to your care team if they continue or are bothersome): Diarrhea Hair loss Joint pain Loss of appetite Muscle pain Nausea Vomiting This list may not describe all possible side effects. Call your doctor for medical advice about side effects. You may report side effects to FDA at  1-800-FDA-1088. Where should I keep my medication? This medication is given in a hospital or clinic. It will not be stored at home. NOTE: This sheet is a summary. It may not cover all possible information. If you have questions about this medicine, talk to your doctor, pharmacist, or health care provider.  2024 Elsevier/Gold Standard (2021-06-15 00:00:00)  Carboplatin Injection What is this medication? CARBOPLATIN (KAR boe pla tin) treats some types of cancer. It works by slowing down the growth of cancer cells. This medicine may be used for other purposes; ask your health care provider or pharmacist if you have questions. COMMON BRAND NAME(S): Paraplatin What should I tell my care team before I take this medication? They need to know if you have any of these conditions: Blood disorders Hearing problems Kidney disease Recent or ongoing radiation therapy An unusual or allergic reaction to carboplatin, cisplatin, other medications, foods, dyes, or preservatives Pregnant or trying to get pregnant Breast-feeding How should I use this medication? This medication is injected into a vein. It is given by your care team in a hospital or clinic setting. Talk to your care team about the use of this medication in children. Special care may be needed. Overdosage: If you think you have taken too much of this medicine contact a poison control center or emergency room at once. NOTE: This medicine is only for you. Do not share this medicine with others. What if I miss a dose? Keep appointments for follow-up doses. It is important not to miss your dose. Call your care team if you are unable to keep an appointment. What may interact with this medication? Medications for seizures Some antibiotics, such as amikacin, gentamicin, neomycin, streptomycin, tobramycin Vaccines This list may not describe all possible interactions. Give your health care provider a list of all the medicines, herbs,  non-prescription drugs, or dietary supplements you use. Also tell them if you smoke, drink alcohol, or use illegal drugs. Some items may interact with your medicine. What should I watch for while using this medication? Your condition will be monitored carefully while you are receiving this medication. You may need blood work while taking this medication. This medication may make you feel generally unwell. This is not uncommon, as chemotherapy can affect healthy cells as well as cancer cells. Report any side effects. Continue your course of treatment even though you feel ill unless your care team tells you to stop. In some cases, you may be given additional medications to help with side effects. Follow all directions for their use. This medication may increase your risk of getting an infection. Call your care team for advice if you get a fever, chills, sore throat, or other symptoms of a cold or flu. Do not treat yourself. Try to avoid being around people who are sick. Avoid taking medications that contain aspirin, acetaminophen, ibuprofen, naproxen, or ketoprofen unless instructed by your care team. These medications may hide a fever. Be careful  brushing or flossing your teeth or using a toothpick because you may get an infection or bleed more easily. If you have any dental work done, tell your dentist you are receiving this medication. Talk to your care team if you wish to become pregnant or think you might be pregnant. This medication can cause serious birth defects. Talk to your care team about effective forms of contraception. Do not breast-feed while taking this medication. What side effects may I notice from receiving this medication? Side effects that you should report to your care team as soon as possible: Allergic reactions--skin rash, itching, hives, swelling of the face, lips, tongue, or throat Infection--fever, chills, cough, sore throat, wounds that don't heal, pain or trouble when passing  urine, general feeling of discomfort or being unwell Low red blood cell level--unusual weakness or fatigue, dizziness, headache, trouble breathing Pain, tingling, or numbness in the hands or feet, muscle weakness, change in vision, confusion or trouble speaking, loss of balance or coordination, trouble walking, seizures Unusual bruising or bleeding Side effects that usually do not require medical attention (report to your care team if they continue or are bothersome): Hair loss Nausea Unusual weakness or fatigue Vomiting This list may not describe all possible side effects. Call your doctor for medical advice about side effects. You may report side effects to FDA at 1-800-FDA-1088. Where should I keep my medication? This medication is given in a hospital or clinic. It will not be stored at home. NOTE: This sheet is a summary. It may not cover all possible information. If you have questions about this medicine, talk to your doctor, pharmacist, or health care provider.  2024 Elsevier/Gold Standard (2021-05-18 00:00:00)

## 2023-01-24 NOTE — Progress Notes (Signed)
Pt here for patient teaching. Pt given Radiation and You booklet, skin care instructions, and Sonafine.  Reviewed areas of pertinence such as fatigue, hair loss, nausea and vomiting, skin changes, throat changes, cough, and shortness of breath. Pt able to give teach back of to pat skin, use unscented/gentle soap, and drink plenty of water,apply Sonafine bid, avoid applying anything to skin within 4 hours of treatment, and to use an electric razor if they must shave. Pt verbalizes understanding of information given and will contact nursing with any questions or concerns.

## 2023-01-24 NOTE — Progress Notes (Signed)
[facility] CSW Progress Note  Visual merchandiser met with patient and daughter in infusion.  Pt provided CSW w/ a copy of the award letter she received for food stamps.  Letter forwarded to [employer].  Pt and daughter informed pt will need to turn in proof of income which pt states she will bring in tomorrow to apply for the [employer].  CSW provided a list of acceptable expenses should pt wish to bring in a bill with her proof of income.  CSW to remain available as appropriate throughout duration of treatment.      Rachel Moulds, LCSW Clinical Social Worker [facility]

## 2023-01-25 ENCOUNTER — Other Ambulatory Visit: Payer: Self-pay

## 2023-01-25 ENCOUNTER — Telehealth: Payer: Self-pay | Admitting: *Deleted

## 2023-01-25 ENCOUNTER — Ambulatory Visit
Admission: RE | Admit: 2023-01-25 | Discharge: 2023-01-25 | Disposition: A | Discharge status: Home / Self Care | Payer: Medicare Other | Source: Ambulatory Visit | Attending: Radiation Oncology | Admitting: Radiation Oncology

## 2023-01-25 ENCOUNTER — Ambulatory Visit (HOSPITAL_BASED_OUTPATIENT_CLINIC_OR_DEPARTMENT_OTHER)
Admission: RE | Admit: 2023-01-25 | Discharge: 2023-01-25 | Disposition: A | Discharge status: Home / Self Care | Payer: Medicare Other | Source: Ambulatory Visit | Attending: Family Medicine | Admitting: Family Medicine

## 2023-01-25 DIAGNOSIS — Z79630 Long term (current) use of alkylating agent: Secondary | ICD-10-CM | POA: Diagnosis not present

## 2023-01-25 DIAGNOSIS — C3412 Malignant neoplasm of upper lobe, left bronchus or lung: Secondary | ICD-10-CM | POA: Diagnosis not present

## 2023-01-25 DIAGNOSIS — Z5111 Encounter for antineoplastic chemotherapy: Secondary | ICD-10-CM | POA: Diagnosis not present

## 2023-01-25 DIAGNOSIS — Z7902 Long term (current) use of antithrombotics/antiplatelets: Secondary | ICD-10-CM | POA: Diagnosis not present

## 2023-01-25 DIAGNOSIS — M79601 Pain in right arm: Secondary | ICD-10-CM

## 2023-01-25 DIAGNOSIS — Z86718 Personal history of other venous thrombosis and embolism: Secondary | ICD-10-CM | POA: Diagnosis not present

## 2023-01-25 DIAGNOSIS — M2550 Pain in unspecified joint: Secondary | ICD-10-CM | POA: Diagnosis not present

## 2023-01-25 DIAGNOSIS — M25511 Pain in right shoulder: Secondary | ICD-10-CM | POA: Diagnosis not present

## 2023-01-25 DIAGNOSIS — Z510 Encounter for antineoplastic radiation therapy: Secondary | ICD-10-CM | POA: Diagnosis not present

## 2023-01-25 DIAGNOSIS — R590 Localized enlarged lymph nodes: Secondary | ICD-10-CM | POA: Diagnosis not present

## 2023-01-25 DIAGNOSIS — F17210 Nicotine dependence, cigarettes, uncomplicated: Secondary | ICD-10-CM | POA: Diagnosis not present

## 2023-01-25 DIAGNOSIS — R5383 Other fatigue: Secondary | ICD-10-CM | POA: Diagnosis not present

## 2023-01-25 DIAGNOSIS — Z79899 Other long term (current) drug therapy: Secondary | ICD-10-CM | POA: Diagnosis not present

## 2023-01-25 DIAGNOSIS — M47812 Spondylosis without myelopathy or radiculopathy, cervical region: Secondary | ICD-10-CM | POA: Diagnosis not present

## 2023-01-25 DIAGNOSIS — Z79633 Long term (current) use of mitotic inhibitor: Secondary | ICD-10-CM | POA: Diagnosis not present

## 2023-01-25 DIAGNOSIS — E785 Hyperlipidemia, unspecified: Secondary | ICD-10-CM | POA: Diagnosis not present

## 2023-01-25 LAB — RAD ONC ARIA SESSION SUMMARY
Course Elapsed Days: 6
Plan Fractions Treated to Date: 5
Plan Prescribed Dose Per Fraction: 2 Gy
Plan Total Fractions Prescribed: 30
Plan Total Prescribed Dose: 60 Gy
Reference Point Dosage Given to Date: 10 Gy
Reference Point Session Dosage Given: 2 Gy
Session Number: 5

## 2023-01-25 NOTE — Telephone Encounter (Signed)
CALLED PATIENT TO INFORM OF DOPPLER FOR 01-25-23 @ 2 PM @ [facility], LVM FOR A RETURN CALL

## 2023-01-25 NOTE — Telephone Encounter (Signed)
Called & left message on identified vm to call back to let us know how she did with her treatment so far.  Message left to call her MD RN.

## 2023-01-26 ENCOUNTER — Ambulatory Visit
Admission: RE | Admit: 2023-01-26 | Discharge: 2023-01-26 | Disposition: A | Discharge status: Home / Self Care | Payer: Medicare Other | Source: Ambulatory Visit | Attending: Radiation Oncology | Admitting: Radiation Oncology

## 2023-01-26 ENCOUNTER — Encounter: Payer: Self-pay | Admitting: Internal Medicine

## 2023-01-26 ENCOUNTER — Other Ambulatory Visit: Payer: Self-pay

## 2023-01-26 DIAGNOSIS — E785 Hyperlipidemia, unspecified: Secondary | ICD-10-CM | POA: Diagnosis not present

## 2023-01-26 DIAGNOSIS — R590 Localized enlarged lymph nodes: Secondary | ICD-10-CM | POA: Diagnosis not present

## 2023-01-26 DIAGNOSIS — Z79899 Other long term (current) drug therapy: Secondary | ICD-10-CM | POA: Diagnosis not present

## 2023-01-26 DIAGNOSIS — Z86718 Personal history of other venous thrombosis and embolism: Secondary | ICD-10-CM | POA: Diagnosis not present

## 2023-01-26 DIAGNOSIS — Z7902 Long term (current) use of antithrombotics/antiplatelets: Secondary | ICD-10-CM | POA: Diagnosis not present

## 2023-01-26 DIAGNOSIS — Z79630 Long term (current) use of alkylating agent: Secondary | ICD-10-CM | POA: Diagnosis not present

## 2023-01-26 DIAGNOSIS — M79601 Pain in right arm: Secondary | ICD-10-CM | POA: Diagnosis not present

## 2023-01-26 DIAGNOSIS — Z510 Encounter for antineoplastic radiation therapy: Secondary | ICD-10-CM | POA: Diagnosis not present

## 2023-01-26 DIAGNOSIS — M47812 Spondylosis without myelopathy or radiculopathy, cervical region: Secondary | ICD-10-CM | POA: Diagnosis not present

## 2023-01-26 DIAGNOSIS — F17210 Nicotine dependence, cigarettes, uncomplicated: Secondary | ICD-10-CM | POA: Diagnosis not present

## 2023-01-26 DIAGNOSIS — C3412 Malignant neoplasm of upper lobe, left bronchus or lung: Secondary | ICD-10-CM | POA: Diagnosis not present

## 2023-01-26 DIAGNOSIS — M25511 Pain in right shoulder: Secondary | ICD-10-CM | POA: Diagnosis not present

## 2023-01-26 DIAGNOSIS — Z79633 Long term (current) use of mitotic inhibitor: Secondary | ICD-10-CM | POA: Diagnosis not present

## 2023-01-26 DIAGNOSIS — Z5111 Encounter for antineoplastic chemotherapy: Secondary | ICD-10-CM | POA: Diagnosis not present

## 2023-01-26 DIAGNOSIS — M2550 Pain in unspecified joint: Secondary | ICD-10-CM | POA: Diagnosis not present

## 2023-01-26 DIAGNOSIS — R5383 Other fatigue: Secondary | ICD-10-CM | POA: Diagnosis not present

## 2023-01-26 LAB — RAD ONC ARIA SESSION SUMMARY
Course Elapsed Days: 7
Plan Fractions Treated to Date: 6
Plan Prescribed Dose Per Fraction: 2 Gy
Plan Total Fractions Prescribed: 30
Plan Total Prescribed Dose: 60 Gy
Reference Point Dosage Given to Date: 12 Gy
Reference Point Session Dosage Given: 2 Gy
Session Number: 6

## 2023-01-26 NOTE — Progress Notes (Signed)
Pt is approved for the $1000 [employer] grant.  

## 2023-01-27 ENCOUNTER — Ambulatory Visit (HOSPITAL_COMMUNITY)
Admission: RE | Admit: 2023-01-27 | Discharge: 2023-01-27 | Disposition: A | Discharge status: Home / Self Care | Payer: Medicare Other | Source: Ambulatory Visit | Attending: Radiation Oncology | Admitting: Radiation Oncology

## 2023-01-27 ENCOUNTER — Other Ambulatory Visit: Payer: Self-pay

## 2023-01-27 ENCOUNTER — Ambulatory Visit
Admission: RE | Admit: 2023-01-27 | Discharge: 2023-01-27 | Disposition: A | Discharge status: Home / Self Care | Payer: Medicare Other | Source: Ambulatory Visit | Attending: Radiation Oncology | Admitting: Radiation Oncology

## 2023-01-27 DIAGNOSIS — M2550 Pain in unspecified joint: Secondary | ICD-10-CM | POA: Diagnosis not present

## 2023-01-27 DIAGNOSIS — Z510 Encounter for antineoplastic radiation therapy: Secondary | ICD-10-CM | POA: Diagnosis not present

## 2023-01-27 DIAGNOSIS — Z79633 Long term (current) use of mitotic inhibitor: Secondary | ICD-10-CM | POA: Diagnosis not present

## 2023-01-27 DIAGNOSIS — M19011 Primary osteoarthritis, right shoulder: Secondary | ICD-10-CM | POA: Diagnosis not present

## 2023-01-27 DIAGNOSIS — M47812 Spondylosis without myelopathy or radiculopathy, cervical region: Secondary | ICD-10-CM | POA: Diagnosis not present

## 2023-01-27 DIAGNOSIS — R5383 Other fatigue: Secondary | ICD-10-CM | POA: Diagnosis not present

## 2023-01-27 DIAGNOSIS — Z5111 Encounter for antineoplastic chemotherapy: Secondary | ICD-10-CM | POA: Diagnosis not present

## 2023-01-27 DIAGNOSIS — Z7902 Long term (current) use of antithrombotics/antiplatelets: Secondary | ICD-10-CM | POA: Diagnosis not present

## 2023-01-27 DIAGNOSIS — C3412 Malignant neoplasm of upper lobe, left bronchus or lung: Secondary | ICD-10-CM | POA: Diagnosis not present

## 2023-01-27 DIAGNOSIS — Z79899 Other long term (current) drug therapy: Secondary | ICD-10-CM | POA: Diagnosis not present

## 2023-01-27 DIAGNOSIS — M25511 Pain in right shoulder: Secondary | ICD-10-CM | POA: Insufficient documentation

## 2023-01-27 DIAGNOSIS — M79601 Pain in right arm: Secondary | ICD-10-CM | POA: Diagnosis not present

## 2023-01-27 DIAGNOSIS — Z86718 Personal history of other venous thrombosis and embolism: Secondary | ICD-10-CM | POA: Diagnosis not present

## 2023-01-27 DIAGNOSIS — R590 Localized enlarged lymph nodes: Secondary | ICD-10-CM | POA: Diagnosis not present

## 2023-01-27 DIAGNOSIS — E785 Hyperlipidemia, unspecified: Secondary | ICD-10-CM | POA: Diagnosis not present

## 2023-01-27 DIAGNOSIS — F17210 Nicotine dependence, cigarettes, uncomplicated: Secondary | ICD-10-CM | POA: Diagnosis not present

## 2023-01-27 DIAGNOSIS — Z79630 Long term (current) use of alkylating agent: Secondary | ICD-10-CM | POA: Diagnosis not present

## 2023-01-27 LAB — RAD ONC ARIA SESSION SUMMARY
Course Elapsed Days: 8
Plan Fractions Treated to Date: 7
Plan Prescribed Dose Per Fraction: 2 Gy
Plan Total Fractions Prescribed: 30
Plan Total Prescribed Dose: 60 Gy
Reference Point Dosage Given to Date: 14 Gy
Reference Point Session Dosage Given: 2 Gy
Session Number: 7

## 2023-01-30 ENCOUNTER — Encounter: Payer: Self-pay | Admitting: Internal Medicine

## 2023-01-30 ENCOUNTER — Other Ambulatory Visit: Payer: Self-pay

## 2023-01-30 ENCOUNTER — Inpatient Hospital Stay: Payer: Medicare Other

## 2023-01-30 ENCOUNTER — Ambulatory Visit
Admission: RE | Admit: 2023-01-30 | Discharge: 2023-01-30 | Disposition: A | Discharge status: Home / Self Care | Payer: Medicare Other | Source: Ambulatory Visit | Attending: Radiation Oncology | Admitting: Radiation Oncology

## 2023-01-30 DIAGNOSIS — Z510 Encounter for antineoplastic radiation therapy: Secondary | ICD-10-CM | POA: Diagnosis not present

## 2023-01-30 DIAGNOSIS — R5383 Other fatigue: Secondary | ICD-10-CM | POA: Diagnosis not present

## 2023-01-30 DIAGNOSIS — M47812 Spondylosis without myelopathy or radiculopathy, cervical region: Secondary | ICD-10-CM | POA: Diagnosis not present

## 2023-01-30 DIAGNOSIS — Z79633 Long term (current) use of mitotic inhibitor: Secondary | ICD-10-CM | POA: Diagnosis not present

## 2023-01-30 DIAGNOSIS — Z86718 Personal history of other venous thrombosis and embolism: Secondary | ICD-10-CM | POA: Diagnosis not present

## 2023-01-30 DIAGNOSIS — M25511 Pain in right shoulder: Secondary | ICD-10-CM | POA: Diagnosis not present

## 2023-01-30 DIAGNOSIS — C3412 Malignant neoplasm of upper lobe, left bronchus or lung: Secondary | ICD-10-CM | POA: Diagnosis not present

## 2023-01-30 DIAGNOSIS — Z7902 Long term (current) use of antithrombotics/antiplatelets: Secondary | ICD-10-CM | POA: Diagnosis not present

## 2023-01-30 DIAGNOSIS — R590 Localized enlarged lymph nodes: Secondary | ICD-10-CM | POA: Diagnosis not present

## 2023-01-30 DIAGNOSIS — Z79630 Long term (current) use of alkylating agent: Secondary | ICD-10-CM | POA: Diagnosis not present

## 2023-01-30 DIAGNOSIS — E785 Hyperlipidemia, unspecified: Secondary | ICD-10-CM | POA: Diagnosis not present

## 2023-01-30 DIAGNOSIS — Z79899 Other long term (current) drug therapy: Secondary | ICD-10-CM | POA: Diagnosis not present

## 2023-01-30 DIAGNOSIS — Z5111 Encounter for antineoplastic chemotherapy: Secondary | ICD-10-CM | POA: Diagnosis not present

## 2023-01-30 DIAGNOSIS — M79601 Pain in right arm: Secondary | ICD-10-CM | POA: Diagnosis not present

## 2023-01-30 DIAGNOSIS — M2550 Pain in unspecified joint: Secondary | ICD-10-CM | POA: Diagnosis not present

## 2023-01-30 DIAGNOSIS — F17210 Nicotine dependence, cigarettes, uncomplicated: Secondary | ICD-10-CM | POA: Diagnosis not present

## 2023-01-30 LAB — CBC WITH DIFFERENTIAL (CANCER CENTER ONLY)
Abs Immature Granulocytes: 0.03 10*3/uL (ref 0.00–0.07)
Basophils Absolute: 0 10*3/uL (ref 0.0–0.1)
Basophils Relative: 1 %
Eosinophils Absolute: 0 10*3/uL (ref 0.0–0.5)
Eosinophils Relative: 0 %
HCT: 41.5 % (ref 36.0–46.0)
Hemoglobin: 13.6 g/dL (ref 12.0–15.0)
Immature Granulocytes: 1 %
Lymphocytes Relative: 26 %
Lymphs Abs: 1 10*3/uL (ref 0.7–4.0)
MCH: 28 pg (ref 26.0–34.0)
MCHC: 32.8 g/dL (ref 30.0–36.0)
MCV: 85.4 fL (ref 80.0–100.0)
Monocytes Absolute: 0.2 10*3/uL (ref 0.1–1.0)
Monocytes Relative: 5 %
Neutro Abs: 2.7 10*3/uL (ref 1.7–7.7)
Neutrophils Relative %: 67 %
Platelet Count: 256 10*3/uL (ref 150–400)
RBC: 4.86 MIL/uL (ref 3.87–5.11)
RDW: 15.3 % (ref 11.5–15.5)
WBC Count: 4 10*3/uL (ref 4.0–10.5)
nRBC: 0 % (ref 0.0–0.2)

## 2023-01-30 LAB — CMP (CANCER CENTER ONLY)
ALT: 20 U/L (ref 0–44)
AST: 21 U/L (ref 15–41)
Albumin: 3.7 g/dL (ref 3.5–5.0)
Alkaline Phosphatase: 100 U/L (ref 38–126)
Anion gap: 4 — ABNORMAL LOW (ref 5–15)
BUN: 19 mg/dL (ref 8–23)
CO2: 29 mmol/L (ref 22–32)
Calcium: 9 mg/dL (ref 8.9–10.3)
Chloride: 108 mmol/L (ref 98–111)
Creatinine: 0.98 mg/dL (ref 0.44–1.00)
GFR, Estimated: >60 mL/min (ref >60)
Glucose, Bld: 85 mg/dL (ref 70–99)
Potassium: 4.3 mmol/L (ref 3.5–5.1)
Sodium: 141 mmol/L (ref 135–145)
Total Bilirubin: 0.4 mg/dL (ref <1.2)
Total Protein: 6.4 g/dL — ABNORMAL LOW (ref 6.5–8.1)

## 2023-01-30 LAB — RAD ONC ARIA SESSION SUMMARY
Course Elapsed Days: 11
Plan Fractions Treated to Date: 8
Plan Prescribed Dose Per Fraction: 2 Gy
Plan Total Fractions Prescribed: 30
Plan Total Prescribed Dose: 60 Gy
Reference Point Dosage Given to Date: 16 Gy
Reference Point Session Dosage Given: 2 Gy
Session Number: 8

## 2023-01-31 ENCOUNTER — Other Ambulatory Visit: Payer: Medicare Other

## 2023-01-31 ENCOUNTER — Inpatient Hospital Stay: Payer: Medicare Other

## 2023-01-31 ENCOUNTER — Ambulatory Visit: Payer: Medicare Other | Admitting: Internal Medicine

## 2023-01-31 ENCOUNTER — Other Ambulatory Visit: Payer: Self-pay

## 2023-01-31 ENCOUNTER — Inpatient Hospital Stay (HOSPITAL_BASED_OUTPATIENT_CLINIC_OR_DEPARTMENT_OTHER): Payer: Medicare Other | Admitting: Internal Medicine

## 2023-01-31 ENCOUNTER — Ambulatory Visit
Admission: RE | Admit: 2023-01-31 | Discharge: 2023-01-31 | Disposition: A | Discharge status: Home / Self Care | Payer: Medicare Other | Source: Ambulatory Visit | Attending: Radiation Oncology | Admitting: Radiation Oncology

## 2023-01-31 VITALS — BP 124/51 | HR 66 | Temp 97.7°F | Resp 16 | Ht 61.6 in | Wt 143.5 lb

## 2023-01-31 DIAGNOSIS — E785 Hyperlipidemia, unspecified: Secondary | ICD-10-CM | POA: Diagnosis not present

## 2023-01-31 DIAGNOSIS — R590 Localized enlarged lymph nodes: Secondary | ICD-10-CM | POA: Diagnosis not present

## 2023-01-31 DIAGNOSIS — C3412 Malignant neoplasm of upper lobe, left bronchus or lung: Secondary | ICD-10-CM

## 2023-01-31 DIAGNOSIS — Z510 Encounter for antineoplastic radiation therapy: Secondary | ICD-10-CM | POA: Diagnosis not present

## 2023-01-31 DIAGNOSIS — Z5111 Encounter for antineoplastic chemotherapy: Secondary | ICD-10-CM | POA: Diagnosis not present

## 2023-01-31 DIAGNOSIS — Z79630 Long term (current) use of alkylating agent: Secondary | ICD-10-CM | POA: Diagnosis not present

## 2023-01-31 DIAGNOSIS — M2550 Pain in unspecified joint: Secondary | ICD-10-CM | POA: Diagnosis not present

## 2023-01-31 DIAGNOSIS — M47812 Spondylosis without myelopathy or radiculopathy, cervical region: Secondary | ICD-10-CM | POA: Diagnosis not present

## 2023-01-31 DIAGNOSIS — Z79899 Other long term (current) drug therapy: Secondary | ICD-10-CM | POA: Diagnosis not present

## 2023-01-31 DIAGNOSIS — M25511 Pain in right shoulder: Secondary | ICD-10-CM | POA: Diagnosis not present

## 2023-01-31 DIAGNOSIS — M79601 Pain in right arm: Secondary | ICD-10-CM | POA: Diagnosis not present

## 2023-01-31 DIAGNOSIS — F17210 Nicotine dependence, cigarettes, uncomplicated: Secondary | ICD-10-CM | POA: Diagnosis not present

## 2023-01-31 DIAGNOSIS — Z86718 Personal history of other venous thrombosis and embolism: Secondary | ICD-10-CM | POA: Diagnosis not present

## 2023-01-31 DIAGNOSIS — Z7902 Long term (current) use of antithrombotics/antiplatelets: Secondary | ICD-10-CM | POA: Diagnosis not present

## 2023-01-31 DIAGNOSIS — Z79633 Long term (current) use of mitotic inhibitor: Secondary | ICD-10-CM | POA: Diagnosis not present

## 2023-01-31 DIAGNOSIS — R5383 Other fatigue: Secondary | ICD-10-CM | POA: Diagnosis not present

## 2023-01-31 LAB — RAD ONC ARIA SESSION SUMMARY
Course Elapsed Days: 12
Plan Fractions Treated to Date: 9
Plan Prescribed Dose Per Fraction: 2 Gy
Plan Total Fractions Prescribed: 30
Plan Total Prescribed Dose: 60 Gy
Reference Point Dosage Given to Date: 18 Gy
Reference Point Session Dosage Given: 2 Gy
Session Number: 9

## 2023-01-31 MED ORDER — DEXAMETHASONE SODIUM PHOSPHATE 10 MG/ML IJ SOLN
10.0000 mg | Freq: Once | INTRAMUSCULAR | Status: AC
  Administered 2023-01-31: 10 mg via INTRAVENOUS
  Filled 2023-01-31: qty 1

## 2023-01-31 MED ORDER — PALONOSETRON HCL INJECTION 0.25 MG/5ML
0.2500 mg | Freq: Once | INTRAVENOUS | Status: AC
Start: 2023-01-31 — End: 2023-01-31
  Administered 2023-01-31: 0.25 mg via INTRAVENOUS
  Filled 2023-01-31: qty 5

## 2023-01-31 MED ORDER — CARBOPLATIN CHEMO INJECTION 450 MG/45ML
145.4000 mg | Freq: Once | INTRAVENOUS | Status: AC
  Administered 2023-01-31: 150 mg via INTRAVENOUS
  Filled 2023-01-31: qty 15

## 2023-01-31 MED ORDER — DIPHENHYDRAMINE HCL 50 MG/ML IJ SOLN
50.0000 mg | Freq: Once | INTRAMUSCULAR | Status: AC
  Administered 2023-01-31: 50 mg via INTRAVENOUS
  Filled 2023-01-31: qty 1

## 2023-01-31 MED ORDER — SODIUM CHLORIDE 0.9 % IV SOLN
45.0000 mg/m2 | Freq: Once | INTRAVENOUS | Status: AC
  Administered 2023-01-31: 78 mg via INTRAVENOUS
  Filled 2023-01-31: qty 13

## 2023-01-31 MED ORDER — SODIUM CHLORIDE 0.9 % IV SOLN: INTRAVENOUS | Status: DC

## 2023-01-31 MED ORDER — FAMOTIDINE IN NACL 20-0.9 MG/50ML-% IV SOLN
20.0000 mg | Freq: Once | INTRAVENOUS | Status: AC
Start: 2023-01-31 — End: 2023-01-31
  Administered 2023-01-31: 20 mg via INTRAVENOUS
  Filled 2023-01-31: qty 50

## 2023-01-31 NOTE — Patient Instructions (Signed)
 [facility] - A DEPT OF [facility]  Discharge Instructions: Thank you for choosing [facility] to provide your oncology and hematology care.   If you have a lab appointment with the Cancer Center, please go directly to the Cancer Center and check in at the registration area.   Wear comfortable clothing and clothing appropriate for easy access to any Portacath or PICC line.   We strive to give you quality time with your provider. You may need to reschedule your appointment if you arrive late (15 or more minutes).  Arriving late affects you and other patients whose appointments are after yours.  Also, if you miss three or more appointments without notifying the office, you may be dismissed from the clinic at the provider's discretion.      For prescription refill requests, have your pharmacy contact our office and allow 72 hours for refills to be completed.    Today you received the following chemotherapy and/or immunotherapy agents: Paclitaxel, Carboplatin      To help prevent nausea and vomiting after your treatment, we encourage you to take your nausea medication as directed.  BELOW ARE SYMPTOMS THAT SHOULD BE REPORTED IMMEDIATELY: *FEVER GREATER THAN 100.4 F (38 C) OR HIGHER *CHILLS OR SWEATING *NAUSEA AND VOMITING THAT IS NOT CONTROLLED WITH YOUR NAUSEA MEDICATION *UNUSUAL SHORTNESS OF BREATH *UNUSUAL BRUISING OR BLEEDING *URINARY PROBLEMS (pain or burning when urinating, or frequent urination) *BOWEL PROBLEMS (unusual diarrhea, constipation, pain near the anus) TENDERNESS IN MOUTH AND THROAT WITH OR WITHOUT PRESENCE OF ULCERS (sore throat, sores in mouth, or a toothache) UNUSUAL RASH, SWELLING OR PAIN  UNUSUAL VAGINAL DISCHARGE OR ITCHING   Items with * indicate a potential emergency and should be followed up as soon as possible or go to the Emergency Department if any problems should occur.  Please show the CHEMOTHERAPY ALERT CARD or  IMMUNOTHERAPY ALERT CARD at check-in to the Emergency Department and triage nurse.  Should you have questions after your visit or need to cancel or reschedule your appointment, please contact [facility] - A DEPT OF [facility]  Dept: [phone]  and follow the prompts.  Office hours are 8:00 a.m. to 4:30 p.m. Monday - Friday. Please note that voicemails left after 4:00 p.m. may not be returned until the following business day.  We are closed weekends and major holidays. You have access to a nurse at all times for urgent questions. Please call the main number to the clinic Dept: [phone] and follow the prompts.   For any non-urgent questions, you may also contact your provider using MyChart. We now offer e-Visits for anyone 33 and older to request care online for non-urgent symptoms. For details visit [email or web address].   Also download the MyChart app! Go to the app store, search "MyChart", open the app, select [facility], and log in with your MyChart username and password.  Paclitaxel Injection What is this medication? PACLITAXEL (PAK li TAX el) treats some types of cancer. It works by slowing down the growth of cancer cells. This medicine may be used for other purposes; ask your health care provider or pharmacist if you have questions. COMMON BRAND NAME(S): Onxol, Taxol What should I tell my care team before I take this medication? They need to know if you have any of these conditions: Heart disease Liver disease Low white blood cell levels An unusual or allergic reaction to paclitaxel, other medications,  foods, dyes, or preservatives If you or your partner are pregnant or trying to get pregnant Breast-feeding How should I use this medication? This medication is injected into a vein. It is given by your care team in a hospital or clinic setting. Talk to your care team about the use of this medication in children. While it may be given to children  for selected conditions, precautions do apply. Overdosage: If you think you have taken too much of this medicine contact a poison control center or emergency room at once. NOTE: This medicine is only for you. Do not share this medicine with others. What if I miss a dose? Keep appointments for follow-up doses. It is important not to miss your dose. Call your care team if you are unable to keep an appointment. What may interact with this medication? Do not take this medication with any of the following: Live virus vaccines Other medications may affect the way this medication works. Talk with your care team about all of the medications you take. They may suggest changes to your treatment plan to lower the risk of side effects and to make sure your medications work as intended. This list may not describe all possible interactions. Give your health care provider a list of all the medicines, herbs, non-prescription drugs, or dietary supplements you use. Also tell them if you smoke, drink alcohol, or use illegal drugs. Some items may interact with your medicine. What should I watch for while using this medication? Your condition will be monitored carefully while you are receiving this medication. You may need blood work while taking this medication. This medication may make you feel generally unwell. This is not uncommon as chemotherapy can affect healthy cells as well as cancer cells. Report any side effects. Continue your course of treatment even though you feel ill unless your care team tells you to stop. This medication can cause serious allergic reactions. To reduce the risk, your care team may give you other medications to take before receiving this one. Be sure to follow the directions from your care team. This medication may increase your risk of getting an infection. Call your care team for advice if you get a fever, chills, sore throat, or other symptoms of a cold or flu. Do not treat yourself. Try  to avoid being around people who are sick. This medication may increase your risk to bruise or bleed. Call your care team if you notice any unusual bleeding. Be careful brushing or flossing your teeth or using a toothpick because you may get an infection or bleed more easily. If you have any dental work done, tell your dentist you are receiving this medication. Talk to your care team if you may be pregnant. Serious birth defects can occur if you take this medication during pregnancy. Talk to your care team before breastfeeding. Changes to your treatment plan may be needed. What side effects may I notice from receiving this medication? Side effects that you should report to your care team as soon as possible: Allergic reactions--skin rash, itching, hives, swelling of the face, lips, tongue, or throat Heart rhythm changes--fast or irregular heartbeat, dizziness, feeling faint or lightheaded, chest pain, trouble breathing Increase in blood pressure Infection--fever, chills, cough, sore throat, wounds that don't heal, pain or trouble when passing urine, general feeling of discomfort or being unwell Low blood pressure--dizziness, feeling faint or lightheaded, blurry vision Low red blood cell level--unusual weakness or fatigue, dizziness, headache, trouble breathing Painful swelling, warmth, or redness  of the skin, blisters or sores at the infusion site Pain, tingling, or numbness in the hands or feet Slow heartbeat--dizziness, feeling faint or lightheaded, confusion, trouble breathing, unusual weakness or fatigue Unusual bruising or bleeding Side effects that usually do not require medical attention (report to your care team if they continue or are bothersome): Diarrhea Hair loss Joint pain Loss of appetite Muscle pain Nausea Vomiting This list may not describe all possible side effects. Call your doctor for medical advice about side effects. You may report side effects to FDA at  1-800-FDA-1088. Where should I keep my medication? This medication is given in a hospital or clinic. It will not be stored at home. NOTE: This sheet is a summary. It may not cover all possible information. If you have questions about this medicine, talk to your doctor, pharmacist, or health care provider.  2024 Elsevier/Gold Standard (2021-06-15 00:00:00)  Carboplatin Injection What is this medication? CARBOPLATIN (KAR boe pla tin) treats some types of cancer. It works by slowing down the growth of cancer cells. This medicine may be used for other purposes; ask your health care provider or pharmacist if you have questions. COMMON BRAND NAME(S): Paraplatin What should I tell my care team before I take this medication? They need to know if you have any of these conditions: Blood disorders Hearing problems Kidney disease Recent or ongoing radiation therapy An unusual or allergic reaction to carboplatin, cisplatin, other medications, foods, dyes, or preservatives Pregnant or trying to get pregnant Breast-feeding How should I use this medication? This medication is injected into a vein. It is given by your care team in a hospital or clinic setting. Talk to your care team about the use of this medication in children. Special care may be needed. Overdosage: If you think you have taken too much of this medicine contact a poison control center or emergency room at once. NOTE: This medicine is only for you. Do not share this medicine with others. What if I miss a dose? Keep appointments for follow-up doses. It is important not to miss your dose. Call your care team if you are unable to keep an appointment. What may interact with this medication? Medications for seizures Some antibiotics, such as amikacin, gentamicin, neomycin, streptomycin, tobramycin Vaccines This list may not describe all possible interactions. Give your health care provider a list of all the medicines, herbs,  non-prescription drugs, or dietary supplements you use. Also tell them if you smoke, drink alcohol, or use illegal drugs. Some items may interact with your medicine. What should I watch for while using this medication? Your condition will be monitored carefully while you are receiving this medication. You may need blood work while taking this medication. This medication may make you feel generally unwell. This is not uncommon, as chemotherapy can affect healthy cells as well as cancer cells. Report any side effects. Continue your course of treatment even though you feel ill unless your care team tells you to stop. In some cases, you may be given additional medications to help with side effects. Follow all directions for their use. This medication may increase your risk of getting an infection. Call your care team for advice if you get a fever, chills, sore throat, or other symptoms of a cold or flu. Do not treat yourself. Try to avoid being around people who are sick. Avoid taking medications that contain aspirin, acetaminophen, ibuprofen, naproxen, or ketoprofen unless instructed by your care team. These medications may hide a fever. Be careful  brushing or flossing your teeth or using a toothpick because you may get an infection or bleed more easily. If you have any dental work done, tell your dentist you are receiving this medication. Talk to your care team if you wish to become pregnant or think you might be pregnant. This medication can cause serious birth defects. Talk to your care team about effective forms of contraception. Do not breast-feed while taking this medication. What side effects may I notice from receiving this medication? Side effects that you should report to your care team as soon as possible: Allergic reactions--skin rash, itching, hives, swelling of the face, lips, tongue, or throat Infection--fever, chills, cough, sore throat, wounds that don't heal, pain or trouble when passing  urine, general feeling of discomfort or being unwell Low red blood cell level--unusual weakness or fatigue, dizziness, headache, trouble breathing Pain, tingling, or numbness in the hands or feet, muscle weakness, change in vision, confusion or trouble speaking, loss of balance or coordination, trouble walking, seizures Unusual bruising or bleeding Side effects that usually do not require medical attention (report to your care team if they continue or are bothersome): Hair loss Nausea Unusual weakness or fatigue Vomiting This list may not describe all possible side effects. Call your doctor for medical advice about side effects. You may report side effects to FDA at 1-800-FDA-1088. Where should I keep my medication? This medication is given in a hospital or clinic. It will not be stored at home. NOTE: This sheet is a summary. It may not cover all possible information. If you have questions about this medicine, talk to your doctor, pharmacist, or health care provider.  2024 Elsevier/Gold Standard (2021-05-18 00:00:00)

## 2023-01-31 NOTE — Progress Notes (Signed)
[facility] [phone]   [phone]  OFFICE PROGRESS NOTE  Aliene Beams, MD [phone] [address] [place] [address]  DIAGNOSIS: Unresectable stage IIb (T1c, N1, M0) non-small cell lung cancer, squamous cell carcinoma diagnosed in October 2024 and presented with left upper lobe lung nodule in addition to left hilar lymphadenopathy.  The patient not a good surgical candidate because of poor pulmonary function.  PRIOR THERAPY: None  CURRENT THERAPY: A course of concurrent chemoradiation with weekly carboplatin for AUC of 2 and paclitaxel 45 Mg/M2  INTERVAL HISTORY: Vicki Page 72 y.o. female returns to the clinic today for follow-up visit. Discussed the use of AI scribe software for clinical note transcription with the patient, who gave verbal consent to proceed.  History of Present Illness   The patient, a 72 year old individual diagnosed with unresectable stage 2B non-small cell lung cancer (squamous cell carcinoma) in October 2024, has recently begun chemotherapy and radiation treatment. She has completed one round of treatment with carboplatin.  The patient reported experiencing pain on the right side of her body, from the neck down, following the first week of treatment. She was unsure if this pain was associated with the radiation treatment. She also reported feeling sick, tired, and weak but did not experience any nausea, vomiting, or diarrhea post-chemotherapy. There were no reports of shortness of breath, coughing, or weight loss. However, she did mention a strange sensation in her throat, potentially a side effect from the radiation treatment.  The patient expressed concern and anxiety about the potential worsening of side effects with continued chemotherapy. She also expressed frustration and anger about her late cancer diagnosis, which she believes occurred in February of the previous year. She felt that an earlier diagnosis  could have potentially altered her current health situation.      MEDICAL HISTORY: Past Medical History:  Diagnosis Date   Adenomatous colon polyp    Aneurysm (HCC)    brain x 2   Anxiety    Arthritis    Atherosclerosis    Barrett's esophagus    Carotid artery disease (HCC)    Cataracts, bilateral    just watching   DDD (degenerative disc disease), lumbar    Depression    Diabetes mellitus without complication (HCC)    Diverticulosis    DVT (deep venous thrombosis) (HCC)    left leg   Fall at home 05/30/2014   Pt slipped in tub- hurt left hip and right shoulder   GERD (gastroesophageal reflux disease)    H. pylori infection    Hyperlipidemia    Hypertension    Hypothyroidism    IBS (irritable bowel syndrome)    Osteoarthritis    Poor sleep 04/28/2020   Pre-diabetes    no meds, diet controlled   RLS (restless legs syndrome)    Tubular adenoma of colon    Ulcer of the stomach and intestine     ALLERGIES:  is allergic to fluconazole, ropinirole, codeine, darvon [propoxyphene hcl], and flagyl [metronidazole].  MEDICATIONS:  Current Outpatient Medications  Medication Sig Dispense Refill   acetaminophen (TYLENOL) 500 MG tablet Take 1,000 mg by mouth 2 (two) times daily as needed for pain.      amLODipine (NORVASC) 10 MG tablet Take 10 mg by mouth daily. for high blood pressure     aspirin EC 81 MG tablet Take 81 mg by mouth daily.     atorvastatin (LIPITOR) 80 MG tablet Take 80  mg by mouth daily.     buPROPion (WELLBUTRIN SR) 150 MG 12 hr tablet Take 150 mg by mouth 2 (two) times daily.     Calcium-Vitamin D (CALTRATE 600 PLUS-VIT D PO) Take 1 tablet by mouth daily before supper.      Cholecalciferol (VITAMIN D3) 10 MCG (400 UNIT) CAPS Take 400 Units by mouth daily.     clopidogrel (PLAVIX) 75 MG tablet Take 1 tablet (75 mg total) by mouth daily with breakfast. Okay to restart on 11/22/2022.     LORazepam (ATIVAN) 0.5 MG tablet 1 tablet p.o. 30 minutes before MRI.   Repeat x 1 if needed 2 tablet 0   losartan (COZAAR) 50 MG tablet Take 75 mg by mouth daily.     Magnesium 200 MG TABS Take 200 mg by mouth daily.     Multiple Vitamin (MULTIVITAMIN WITH MINERALS) TABS tablet Take 1 tablet by mouth daily before supper. Centrum Silver     ondansetron (ZOFRAN) 8 MG tablet Take 8 mg by mouth every 8 (eight) hours as needed.     pantoprazole (PROTONIX) 40 MG tablet Take 1 tablet (40 mg total) by mouth daily. 30 tablet 2   Probiotic Product (PROBIOTIC PO) Take 1 tablet by mouth daily after lunch.      prochlorperazine (COMPAZINE) 10 MG tablet Take 1 tablet (10 mg total) by mouth every 6 (six) hours as needed for nausea or vomiting. 30 tablet 1   Psyllium (METAMUCIL) 28.3 % POWD Take 1 Dose by mouth daily. 2 teaspoons full = 1 dose     Tetrahydrozoline HCl (VISINE OP) Place 1 drop into both eyes daily as needed (irritation/dry eyes).     vitamin B-12 (CYANOCOBALAMIN) 1000 MCG tablet Take 1,000 mcg by mouth daily.     vitamin C (ASCORBIC ACID) 500 MG tablet Take 500 mg by mouth daily.     No current facility-administered medications for this visit.    SURGICAL HISTORY:  Past Surgical History:  Procedure Laterality Date   ANAL RECTAL MANOMETRY N/A 05/04/2016   Procedure: ANO RECTAL MANOMETRY;  Surgeon: Napoleon Form, MD;  Location: [facility] ENDOSCOPY;  Service: Endoscopy;  Laterality: N/A;   ANEURYSM COILING  01/28/2010   stent-assisted coiling LICA aneurysm   BRAIN SURGERY     Per patient " Left side behind eyes   BRONCHIAL BIOPSY  11/21/2022   Procedure: BRONCHIAL BIOPSIES;  Surgeon: Leslye Peer, MD;  Location: [facility] ENDOSCOPY;  Service: Pulmonary;;   BRONCHIAL BRUSHINGS  11/21/2022   Procedure: BRONCHIAL BRUSHINGS;  Surgeon: Leslye Peer, MD;  Location: [facility] ENDOSCOPY;  Service: Pulmonary;;   BRONCHIAL NEEDLE ASPIRATION BIOPSY  11/21/2022   Procedure: BRONCHIAL NEEDLE ASPIRATION BIOPSIES;  Surgeon: Leslye Peer, MD;  Location: [facility] ENDOSCOPY;  Service:  Pulmonary;;   BRONCHIAL WASHINGS  11/21/2022   Procedure: BRONCHIAL WASHINGS;  Surgeon: Leslye Peer, MD;  Location: [facility] ENDOSCOPY;  Service: Pulmonary;;   FIDUCIAL MARKER PLACEMENT  11/21/2022   Procedure: FIDUCIAL MARKER PLACEMENT;  Surgeon: Leslye Peer, MD;  Location: [facility] ENDOSCOPY;  Service: Pulmonary;;   INSERTION OF ILIAC STENT  10/04/2012   Procedure: INSERTION OF ILIAC STENT;  Surgeon: Corky Crafts, MD;  Location: [facility] CATH LAB;  Service: Cardiovascular;;  Left External Iliac Artery   IR ANGIO INTRA EXTRACRAN SEL COM CAROTID INNOMINATE BILAT MOD SED  11/23/2018   IR ANGIO INTRA EXTRACRAN SEL COM CAROTID INNOMINATE BILAT MOD SED  12/03/2020   IR ANGIO VERTEBRAL SEL SUBCLAVIAN INNOMINATE UNI L  MOD SED  11/23/2018   IR ANGIO VERTEBRAL SEL VERTEBRAL BILAT MOD SED  12/03/2020   IR ANGIO VERTEBRAL SEL VERTEBRAL UNI L MOD SED  12/10/2018   IR ANGIO VERTEBRAL SEL VERTEBRAL UNI R MOD SED  11/23/2018   IR GENERIC HISTORICAL  11/03/2015   IR ANGIO VERTEBRAL SEL VERTEBRAL UNI L MOD SED 11/03/2015 Julieanne Cotton, MD [facility]   IR GENERIC HISTORICAL  11/03/2015   IR ANGIO INTRA EXTRACRAN SEL COM CAROTID INNOMINATE BILAT MOD SED 11/03/2015 Julieanne Cotton, MD [facility]   IR GENERIC HISTORICAL  11/03/2015   IR ANGIO VERTEBRAL SEL SUBCLAVIAN INNOMINATE UNI R MOD SED 11/03/2015 Julieanne Cotton, MD [facility]   IR GENERIC HISTORICAL  11/19/2015   IR RADIOLOGIST EVAL & MGMT 11/19/2015 [facility]   IR RADIOLOGIST EVAL & MGMT  02/15/2022   IR US GUIDE VASC ACCESS RIGHT  11/23/2018   IR US GUIDE VASC ACCESS RIGHT  12/03/2020   LEG SURGERY     LOWER EXTREMITY ANGIOGRAM Left 10/04/2012   and Left iliac stent   LOWER EXTREMITY ANGIOGRAM N/A 10/04/2012   Procedure: LOWER EXTREMITY ANGIOGRAM;  Surgeon: Corky Crafts, MD;  Location: [facility] CATH LAB;  Service: Cardiovascular;  Laterality: N/A;   RADIOLOGY WITH ANESTHESIA N/A 12/10/2018   Procedure: STENTING;  Surgeon:  Julieanne Cotton, MD;  Location: [facility] OR;  Service: Radiology;  Laterality: N/A;   TUBAL LIGATION      REVIEW OF SYSTEMS:  Constitutional: positive for fatigue Eyes: negative Ears, nose, mouth, throat, and face: negative Respiratory: negative Cardiovascular: negative Gastrointestinal: negative Genitourinary:negative Integument/breast: negative Hematologic/lymphatic: negative Musculoskeletal:positive for arthralgias Neurological: negative Behavioral/Psych: negative Endocrine: negative Allergic/Immunologic: negative   PHYSICAL EXAMINATION: General appearance: alert, cooperative, fatigued, and no distress Head: Normocephalic, without obvious abnormality, atraumatic Neck: no adenopathy, no JVD, supple, symmetrical, trachea midline, and thyroid not enlarged, symmetric, no tenderness/mass/nodules Lymph nodes: Cervical, supraclavicular, and axillary nodes normal. Resp: clear to auscultation bilaterally Back: symmetric, no curvature. ROM normal. No CVA tenderness. Cardio: regular rate and rhythm, S1, S2 normal, no murmur, click, rub or gallop GI: soft, non-tender; bowel sounds normal; no masses,  no organomegaly Extremities: extremities normal, atraumatic, no cyanosis or edema Neurologic: Alert and oriented X 3, normal strength and tone. Normal symmetric reflexes. Normal coordination and gait  ECOG PERFORMANCE STATUS: 0 - Asymptomatic  Blood pressure (!) 124/51, pulse 66, temperature 97.7 F (36.5 C), temperature source Temporal, resp. rate 16, height 5' 1.6" (1.565 m), weight 143 lb 8 oz (65.1 kg), SpO2 100%.  LABORATORY DATA: Lab Results  Component Value Date   WBC 4.0 01/30/2023   HGB 13.6 01/30/2023   HCT 41.5 01/30/2023   MCV 85.4 01/30/2023   PLT 256 01/30/2023      Chemistry      Component Value Date/Time   NA 141 01/30/2023 1523   K 4.3 01/30/2023 1523   CL 108 01/30/2023 1523   CO2 29 01/30/2023 1523   BUN 19 01/30/2023 1523   CREATININE 0.98 01/30/2023 1523       Component Value Date/Time   CALCIUM 9.0 01/30/2023 1523   ALKPHOS 100 01/30/2023 1523   AST 21 01/30/2023 1523   ALT 20 01/30/2023 1523   BILITOT 0.4 01/30/2023 1523       RADIOGRAPHIC STUDIES: VAS [place] UPPER EXTREMITY VENOUS DUPLEX Result Date: 01/25/2023 UPPER VENOUS STUDY  Patient Name:  Vicki Page  Date of Exam:   01/25/2023 Medical Rec #: [id number]       Accession #:  [id number] Date of Birth: 03/30/1950       Patient Gender: F Patient Age:   72 years Exam Location:  [facility] Procedure:      VAS [place] UPPER EXTREMITY VENOUS DUPLEX Referring Phys: Antony Blackbird --------------------------------------------------------------------------------  Indications: right shoulder pain Risk Factors: Cancer/chemotherapy. Comparison Study: No previous exams Performing Technologist: Jody Hill RVT, RDMS  Examination Guidelines: A complete evaluation includes B-mode imaging, spectral Doppler, color Doppler, and power Doppler as needed of all accessible portions of each vessel. Bilateral testing is considered an integral part of a complete examination. Limited examinations for reoccurring indications may be performed as noted.  Right Findings: +----------+------------+---------+-----------+----------+-------+ RIGHT     CompressiblePhasicitySpontaneousPropertiesSummary +----------+------------+---------+-----------+----------+-------+ IJV           Full       Yes       Yes                      +----------+------------+---------+-----------+----------+-------+ Subclavian    Full       Yes       Yes                      +----------+------------+---------+-----------+----------+-------+ Axillary      Full       Yes       Yes                      +----------+------------+---------+-----------+----------+-------+ Brachial      Full       Yes       Yes                      +----------+------------+---------+-----------+----------+-------+ Radial        Full                                           +----------+------------+---------+-----------+----------+-------+ Ulnar         Full                                          +----------+------------+---------+-----------+----------+-------+ Cephalic      Full                                          +----------+------------+---------+-----------+----------+-------+ Basilic       Full       Yes       Yes                      +----------+------------+---------+-----------+----------+-------+  Left Findings: +----------+------------+---------+-----------+----------+--------------------+ LEFT      CompressiblePhasicitySpontaneousProperties      Summary        +----------+------------+---------+-----------+----------+--------------------+ Subclavian               Yes       Yes                   patent by  color/doppler     +----------+------------+---------+-----------+----------+--------------------+  Summary:  Right: No evidence of deep vein thrombosis in the upper extremity. No evidence of superficial vein thrombosis in the upper extremity.  Left: No evidence of thrombosis in the subclavian.  *See table(s) above for measurements and observations.  Diagnosing physician: Heath Lark Electronically signed by Heath Lark on 01/25/2023 at 3:59:17 PM.    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 Electronically Signed   By: Jackey Loge D.O.   On: 01/03/2023 09:13   Result Date: 01/03/2023 CLINICAL DATA:  Provided history: Malignant neoplasm of unspecified part of unspecified bronchus or lung. Non-small cell lung cancer, staging. EXAM: MRI HEAD WITHOUT AND WITH CONTRAST TECHNIQUE: Multiplanar, multiecho pulse sequences of the brain and surrounding structures were obtained without and with intravenous contrast.  CONTRAST:  7mL GADAVIST GADOBUTROL 1 MMOL/ML IV SOLN COMPARISON:  CTA head 03/22/2022.  Brain MRI 07/25/2019. FINDINGS: Brain: Mild generalized parenchymal atrophy. Multifocal T2 FLAIR hyperintense signal abnormality within the cerebral white matter, nonspecific but compatible with moderate chronic small vessel ischemic disease. 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 Partially empty sella turcica again noted. There is no acute infarct. No evidence of an intracranial mass. No chronic intracranial blood products. No extra-axial fluid collection. No midline shift. No pathologic intracranial enhancement identified. Vascular: Maintained flow voids within the proximal large arterial vessels. Skull and upper cervical spine: No focal worrisome marrow lesion. Incompletely assessed cervical spondylosis. Sinuses/Orbits: No mass or acute finding within the imaged orbits. Trace mucosal thickening scattered within the paranasal sinuses. IMPRESSION: 1. No evidence of intracranial metastatic disease. 2. Moderate chronic small vessel ischemic changes within the cerebral white matter, similar to the prior brain MRI of 07/25/2019. 3. 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 4. Mild generalized parenchymal atrophy. Electronically Signed: By: Jackey Loge D.O. On: 01/03/2023 09:02    ASSESSMENT AND PLAN: This is a 72 years old African-American female with Unresectable stage IIb (T1c, N1, M0) non-small cell lung cancer, squamous cell carcinoma diagnosed in October 2024 and presented with left upper lobe lung nodule in addition to left hilar lymphadenopathy.  The patient not a good surgical candidate because of poor pulmonary function.  She has been very angry and frustrated about the delay in her diagnosis when she found to have a small nodule in her lungs last year on a screening scan.  She has been bringing her frustration on [place]  since the first time I saw her in October 2024.    Unresectable Stage IIB Non-Small Cell Lung Cancer (NSCLC) - Squamous Cell Carcinoma Diagnosed in October 2024. Currently undergoing weekly carboplatin chemotherapy and radiation therapy. Reports right-sided pain from neck down, not related to cancer or radiation. No nausea, vomiting, diarrhea, shortness of breath, or hemoptysis. Mild throat burning sensation noted. No significant weight loss. Blood work within normal limits. Discussed monitoring for side effects and provided nausea medication as a precaution. Emphasized favorable prognosis with early-stage treatment. - Continue weekly carboplatin chemotherapy - Continue radiation therapy - Monitor for side effects and provide supportive care as needed - Encourage reporting of new or worsening symptoms - Provide emotional support and address concerns about diagnosis and treatment  Emotional Distress Related to Cancer Diagnosis Expressed frustration and anger about diagnosis timing and perceived lack of communication from previous providers.  Discussed importance of positive patient-physician relationship and offered transfer of care if desired. Emphasized need for a fresh start and focus on current treatment plan. - Encourage open communication about concerns and feelings - Reassure about current treatment plan and prognosis - Provide information on support groups for cancer patients  General Health Maintenance Former smoker and part of lung cancer screening program. Discussed importance of ongoing health maintenance and monitoring. - Encourage smoking cessation if applicable - Promote healthy lifestyle including diet and exercise  Follow-up - Schedule regular follow-up appointments to monitor treatment progress - Conduct periodic blood work and imaging studies as needed - Ensure access to supportive care services.   The patient was advised to call immediately if she has any other concerning  symptoms in the interval. The patient voices understanding of current disease status and treatment options and is in agreement with the current care plan.  All questions were answered. The patient knows to call the clinic with any problems, questions or concerns. We can certainly see the patient much sooner if necessary.  The total time spent in the appointment was 40 minutes.  Disclaimer: This note was dictated with voice recognition software. Similar sounding words can inadvertently be transcribed and may not be corrected upon review.

## 2023-02-02 ENCOUNTER — Other Ambulatory Visit: Payer: Self-pay

## 2023-02-02 ENCOUNTER — Encounter: Payer: Self-pay | Admitting: Internal Medicine

## 2023-02-02 ENCOUNTER — Ambulatory Visit
Admission: RE | Admit: 2023-02-02 | Discharge: 2023-02-02 | Disposition: A | Discharge status: Home / Self Care | Payer: Medicare Other | Source: Ambulatory Visit | Attending: Radiation Oncology | Admitting: Radiation Oncology

## 2023-02-02 DIAGNOSIS — R5383 Other fatigue: Secondary | ICD-10-CM | POA: Diagnosis not present

## 2023-02-02 DIAGNOSIS — Z5111 Encounter for antineoplastic chemotherapy: Secondary | ICD-10-CM | POA: Diagnosis not present

## 2023-02-02 DIAGNOSIS — Z79633 Long term (current) use of mitotic inhibitor: Secondary | ICD-10-CM | POA: Diagnosis not present

## 2023-02-02 DIAGNOSIS — F17210 Nicotine dependence, cigarettes, uncomplicated: Secondary | ICD-10-CM | POA: Diagnosis not present

## 2023-02-02 DIAGNOSIS — Z7902 Long term (current) use of antithrombotics/antiplatelets: Secondary | ICD-10-CM | POA: Diagnosis not present

## 2023-02-02 DIAGNOSIS — Z79899 Other long term (current) drug therapy: Secondary | ICD-10-CM | POA: Diagnosis not present

## 2023-02-02 DIAGNOSIS — C3412 Malignant neoplasm of upper lobe, left bronchus or lung: Secondary | ICD-10-CM | POA: Diagnosis not present

## 2023-02-02 DIAGNOSIS — Z79630 Long term (current) use of alkylating agent: Secondary | ICD-10-CM | POA: Diagnosis not present

## 2023-02-02 DIAGNOSIS — E785 Hyperlipidemia, unspecified: Secondary | ICD-10-CM | POA: Diagnosis not present

## 2023-02-02 DIAGNOSIS — M47812 Spondylosis without myelopathy or radiculopathy, cervical region: Secondary | ICD-10-CM | POA: Diagnosis not present

## 2023-02-02 DIAGNOSIS — M79601 Pain in right arm: Secondary | ICD-10-CM | POA: Diagnosis not present

## 2023-02-02 DIAGNOSIS — M25511 Pain in right shoulder: Secondary | ICD-10-CM | POA: Diagnosis not present

## 2023-02-02 DIAGNOSIS — M2550 Pain in unspecified joint: Secondary | ICD-10-CM | POA: Diagnosis not present

## 2023-02-02 DIAGNOSIS — Z86718 Personal history of other venous thrombosis and embolism: Secondary | ICD-10-CM | POA: Diagnosis not present

## 2023-02-02 DIAGNOSIS — Z510 Encounter for antineoplastic radiation therapy: Secondary | ICD-10-CM | POA: Diagnosis not present

## 2023-02-02 DIAGNOSIS — R590 Localized enlarged lymph nodes: Secondary | ICD-10-CM | POA: Diagnosis not present

## 2023-02-02 LAB — RAD ONC ARIA SESSION SUMMARY
Course Elapsed Days: 14
Plan Fractions Treated to Date: 10
Plan Prescribed Dose Per Fraction: 2 Gy
Plan Total Fractions Prescribed: 30
Plan Total Prescribed Dose: 60 Gy
Reference Point Dosage Given to Date: 20 Gy
Reference Point Session Dosage Given: 2 Gy
Session Number: 10

## 2023-02-02 NOTE — Progress Notes (Signed)
Pt came in today 12/20 for her C2D1 chemoradiation. Pt has voiced concerns about her condition via MyChart messaging and confirmed today that she is still upset that it took so long for her cancer to be diagnosed and is convinced that this would be her last christmas. Dr Arbutus Ped assured her that her cancer is still early stage and very treatable. Dr Arbutus Ped offered to refer her to a different provider if she would prefer, but pt declined. Pt states she will continue on with her treatments as planned.  I also reminded pt that the calls from the cancer center were still going straight to VM. I called the pt from our desk phone and put it on speaker so she could hear it going straight to VM. Pt couldn't understand why that was happening. I offered to look through the list of blocked numbers on the pts phone. I found [phone] in the list of blocked numbers and removed it from the list. Pt should now be able to get calls and texts from [facility].

## 2023-02-03 ENCOUNTER — Other Ambulatory Visit: Payer: Self-pay

## 2023-02-03 ENCOUNTER — Ambulatory Visit
Admission: RE | Admit: 2023-02-03 | Discharge: 2023-02-03 | Disposition: A | Discharge status: Home / Self Care | Payer: Medicare Other | Source: Ambulatory Visit | Attending: Radiation Oncology | Admitting: Radiation Oncology

## 2023-02-03 ENCOUNTER — Encounter: Payer: Self-pay | Admitting: General Practice

## 2023-02-03 DIAGNOSIS — Z79630 Long term (current) use of alkylating agent: Secondary | ICD-10-CM | POA: Diagnosis not present

## 2023-02-03 DIAGNOSIS — Z79899 Other long term (current) drug therapy: Secondary | ICD-10-CM | POA: Diagnosis not present

## 2023-02-03 DIAGNOSIS — M47812 Spondylosis without myelopathy or radiculopathy, cervical region: Secondary | ICD-10-CM | POA: Diagnosis not present

## 2023-02-03 DIAGNOSIS — Z510 Encounter for antineoplastic radiation therapy: Secondary | ICD-10-CM | POA: Diagnosis not present

## 2023-02-03 DIAGNOSIS — C3412 Malignant neoplasm of upper lobe, left bronchus or lung: Secondary | ICD-10-CM | POA: Diagnosis not present

## 2023-02-03 DIAGNOSIS — Z79633 Long term (current) use of mitotic inhibitor: Secondary | ICD-10-CM | POA: Diagnosis not present

## 2023-02-03 DIAGNOSIS — R5383 Other fatigue: Secondary | ICD-10-CM | POA: Diagnosis not present

## 2023-02-03 DIAGNOSIS — Z5111 Encounter for antineoplastic chemotherapy: Secondary | ICD-10-CM | POA: Diagnosis not present

## 2023-02-03 DIAGNOSIS — R590 Localized enlarged lymph nodes: Secondary | ICD-10-CM | POA: Diagnosis not present

## 2023-02-03 DIAGNOSIS — Z7902 Long term (current) use of antithrombotics/antiplatelets: Secondary | ICD-10-CM | POA: Diagnosis not present

## 2023-02-03 DIAGNOSIS — M2550 Pain in unspecified joint: Secondary | ICD-10-CM | POA: Diagnosis not present

## 2023-02-03 DIAGNOSIS — Z86718 Personal history of other venous thrombosis and embolism: Secondary | ICD-10-CM | POA: Diagnosis not present

## 2023-02-03 DIAGNOSIS — E785 Hyperlipidemia, unspecified: Secondary | ICD-10-CM | POA: Diagnosis not present

## 2023-02-03 DIAGNOSIS — M25511 Pain in right shoulder: Secondary | ICD-10-CM | POA: Diagnosis not present

## 2023-02-03 DIAGNOSIS — M79601 Pain in right arm: Secondary | ICD-10-CM | POA: Diagnosis not present

## 2023-02-03 DIAGNOSIS — F17210 Nicotine dependence, cigarettes, uncomplicated: Secondary | ICD-10-CM | POA: Diagnosis not present

## 2023-02-03 LAB — RAD ONC ARIA SESSION SUMMARY
Course Elapsed Days: 15
Plan Fractions Treated to Date: 11
Plan Prescribed Dose Per Fraction: 2 Gy
Plan Total Fractions Prescribed: 30
Plan Total Prescribed Dose: 60 Gy
Reference Point Dosage Given to Date: 22 Gy
Reference Point Session Dosage Given: 2 Gy
Session Number: 11

## 2023-02-03 NOTE — Progress Notes (Signed)
[facility] Spiritual Care Note  Reached Ms Vicki Page by phone per referral from nurse navigator for additional layer of support. Ms Vicki Page was appreciative of call, sharing about family needs that complicate her receiving support from some of her closest people. (One of her older brothers died in 2015-03-02; the other lives in [place]. Her older daughter has advanced dementia; her younger daughter is supportive and is also a key caregiver for her son/Ms Vicki Page's younger grandson, who has cerebral palsy.) Ms Vicki Page also reports other factors that currently limit the support she can access: need for car repair, strong dislike of cold weather, and pain (especially in one arm).  She is looking forward to having her car repaired and getting a break in the weather so that she can more comfortably explore support programming, which we plan to discuss in more detail on a follow-up call early in the new year.  Ms Vicki Page aslo has direct Spiritual Care number, is aware of ongoing chaplain availability (especially when she is going to be on campus already for radiation), and knows to call whenever needed/desired.   [address] [place], [place], [facility] Pager [phone] Voicemail [phone]

## 2023-02-06 ENCOUNTER — Other Ambulatory Visit: Payer: Self-pay

## 2023-02-06 ENCOUNTER — Ambulatory Visit
Admission: RE | Admit: 2023-02-06 | Discharge: 2023-02-06 | Disposition: A | Discharge status: Home / Self Care | Payer: Medicare Other | Source: Ambulatory Visit | Attending: Radiation Oncology | Admitting: Radiation Oncology

## 2023-02-06 ENCOUNTER — Inpatient Hospital Stay: Payer: Medicare Other | Admitting: Physician Assistant

## 2023-02-06 ENCOUNTER — Inpatient Hospital Stay: Payer: Medicare Other

## 2023-02-06 ENCOUNTER — Encounter: Payer: Self-pay | Admitting: Internal Medicine

## 2023-02-06 VITALS — BP 152/72 | HR 62 | Temp 98.0°F | Resp 18 | Wt 149.8 lb

## 2023-02-06 DIAGNOSIS — E785 Hyperlipidemia, unspecified: Secondary | ICD-10-CM | POA: Diagnosis not present

## 2023-02-06 DIAGNOSIS — F17210 Nicotine dependence, cigarettes, uncomplicated: Secondary | ICD-10-CM | POA: Diagnosis not present

## 2023-02-06 DIAGNOSIS — Z510 Encounter for antineoplastic radiation therapy: Secondary | ICD-10-CM | POA: Diagnosis not present

## 2023-02-06 DIAGNOSIS — Z79899 Other long term (current) drug therapy: Secondary | ICD-10-CM | POA: Diagnosis not present

## 2023-02-06 DIAGNOSIS — C3412 Malignant neoplasm of upper lobe, left bronchus or lung: Secondary | ICD-10-CM | POA: Diagnosis not present

## 2023-02-06 DIAGNOSIS — M47812 Spondylosis without myelopathy or radiculopathy, cervical region: Secondary | ICD-10-CM | POA: Diagnosis not present

## 2023-02-06 DIAGNOSIS — Z7902 Long term (current) use of antithrombotics/antiplatelets: Secondary | ICD-10-CM | POA: Diagnosis not present

## 2023-02-06 DIAGNOSIS — M2550 Pain in unspecified joint: Secondary | ICD-10-CM | POA: Diagnosis not present

## 2023-02-06 DIAGNOSIS — Z79630 Long term (current) use of alkylating agent: Secondary | ICD-10-CM | POA: Diagnosis not present

## 2023-02-06 DIAGNOSIS — Z5111 Encounter for antineoplastic chemotherapy: Secondary | ICD-10-CM | POA: Diagnosis not present

## 2023-02-06 DIAGNOSIS — M25511 Pain in right shoulder: Secondary | ICD-10-CM | POA: Diagnosis not present

## 2023-02-06 DIAGNOSIS — M79601 Pain in right arm: Secondary | ICD-10-CM | POA: Diagnosis not present

## 2023-02-06 DIAGNOSIS — Z86718 Personal history of other venous thrombosis and embolism: Secondary | ICD-10-CM | POA: Diagnosis not present

## 2023-02-06 DIAGNOSIS — Z79633 Long term (current) use of mitotic inhibitor: Secondary | ICD-10-CM | POA: Diagnosis not present

## 2023-02-06 DIAGNOSIS — R590 Localized enlarged lymph nodes: Secondary | ICD-10-CM | POA: Diagnosis not present

## 2023-02-06 DIAGNOSIS — R5383 Other fatigue: Secondary | ICD-10-CM | POA: Diagnosis not present

## 2023-02-06 LAB — CBC WITH DIFFERENTIAL (CANCER CENTER ONLY)
Abs Immature Granulocytes: 0.01 10*3/uL (ref 0.00–0.07)
Basophils Absolute: 0 10*3/uL (ref 0.0–0.1)
Basophils Relative: 1 %
Eosinophils Absolute: 0 10*3/uL (ref 0.0–0.5)
Eosinophils Relative: 0 %
HCT: 41.8 % (ref 36.0–46.0)
Hemoglobin: 13.8 g/dL (ref 12.0–15.0)
Immature Granulocytes: 0 %
Lymphocytes Relative: 27 %
Lymphs Abs: 0.7 10*3/uL (ref 0.7–4.0)
MCH: 28.3 pg (ref 26.0–34.0)
MCHC: 33 g/dL (ref 30.0–36.0)
MCV: 85.8 fL (ref 80.0–100.0)
Monocytes Absolute: 0.2 10*3/uL (ref 0.1–1.0)
Monocytes Relative: 6 %
Neutro Abs: 1.7 10*3/uL (ref 1.7–7.7)
Neutrophils Relative %: 66 %
Platelet Count: 261 10*3/uL (ref 150–400)
RBC: 4.87 MIL/uL (ref 3.87–5.11)
RDW: 15.7 % — ABNORMAL HIGH (ref 11.5–15.5)
WBC Count: 2.6 10*3/uL — ABNORMAL LOW (ref 4.0–10.5)
nRBC: 0 % (ref 0.0–0.2)

## 2023-02-06 LAB — RAD ONC ARIA SESSION SUMMARY
Course Elapsed Days: 18
Plan Fractions Treated to Date: 12
Plan Prescribed Dose Per Fraction: 2 Gy
Plan Total Fractions Prescribed: 30
Plan Total Prescribed Dose: 60 Gy
Reference Point Dosage Given to Date: 24 Gy
Reference Point Session Dosage Given: 2 Gy
Session Number: 12

## 2023-02-06 LAB — CMP (CANCER CENTER ONLY)
ALT: 24 U/L (ref 0–44)
AST: 24 U/L (ref 15–41)
Albumin: 3.6 g/dL (ref 3.5–5.0)
Alkaline Phosphatase: 78 U/L (ref 38–126)
Anion gap: 5 (ref 5–15)
BUN: 17 mg/dL (ref 8–23)
CO2: 30 mmol/L (ref 22–32)
Calcium: 9.2 mg/dL (ref 8.9–10.3)
Chloride: 105 mmol/L (ref 98–111)
Creatinine: 0.95 mg/dL (ref 0.44–1.00)
GFR, Estimated: >60 mL/min (ref >60)
Glucose, Bld: 104 mg/dL — ABNORMAL HIGH (ref 70–99)
Potassium: 4.4 mmol/L (ref 3.5–5.1)
Sodium: 140 mmol/L (ref 135–145)
Total Bilirubin: 0.6 mg/dL (ref 0.0–1.2)
Total Protein: 6.6 g/dL (ref 6.5–8.1)

## 2023-02-06 MED ORDER — DEXAMETHASONE SODIUM PHOSPHATE 10 MG/ML IJ SOLN
10.0000 mg | Freq: Once | INTRAMUSCULAR | Status: AC
  Administered 2023-02-06: 10 mg via INTRAVENOUS
  Filled 2023-02-06: qty 1

## 2023-02-06 MED ORDER — HEPARIN SOD (PORK) LOCK FLUSH 100 UNIT/ML IV SOLN
500.0000 [IU] | Freq: Once | INTRAVENOUS | Status: DC | PRN
Start: 2023-02-06 — End: 2023-02-06

## 2023-02-06 MED ORDER — FAMOTIDINE IN NACL 20-0.9 MG/50ML-% IV SOLN
20.0000 mg | Freq: Once | INTRAVENOUS | Status: AC
Start: 2023-02-06 — End: 2023-02-06
  Administered 2023-02-06: 20 mg via INTRAVENOUS
  Filled 2023-02-06: qty 50

## 2023-02-06 MED ORDER — SODIUM CHLORIDE 0.9% FLUSH
10.0000 mL | INTRAVENOUS | Status: DC | PRN
Start: 2023-02-06 — End: 2023-02-06

## 2023-02-06 MED ORDER — DIPHENHYDRAMINE HCL 50 MG/ML IJ SOLN
50.0000 mg | Freq: Once | INTRAMUSCULAR | Status: AC
  Administered 2023-02-06: 50 mg via INTRAVENOUS
  Filled 2023-02-06: qty 1

## 2023-02-06 MED ORDER — SODIUM CHLORIDE 0.9 % IV SOLN
145.4000 mg | Freq: Once | INTRAVENOUS | Status: AC
  Administered 2023-02-06: 150 mg via INTRAVENOUS
  Filled 2023-02-06: qty 15

## 2023-02-06 MED ORDER — SODIUM CHLORIDE 0.9 % IV SOLN
INTRAVENOUS | Status: DC
Start: 2023-02-06 — End: 2023-02-06

## 2023-02-06 MED ORDER — SODIUM CHLORIDE 0.9 % IV SOLN
45.0000 mg/m2 | Freq: Once | INTRAVENOUS | Status: AC
  Administered 2023-02-06: 78 mg via INTRAVENOUS
  Filled 2023-02-06: qty 13

## 2023-02-06 MED ORDER — PALONOSETRON HCL INJECTION 0.25 MG/5ML
0.2500 mg | Freq: Once | INTRAVENOUS | Status: AC
  Administered 2023-02-06: 0.25 mg via INTRAVENOUS
  Filled 2023-02-06: qty 5

## 2023-02-06 NOTE — Patient Instructions (Signed)
 [facility] - A DEPT OF [facility]  Discharge Instructions: Thank you for choosing [facility] to provide your oncology and hematology care.   If you have a lab appointment with the Cancer Center, please go directly to the Cancer Center and check in at the registration area.   Wear comfortable clothing and clothing appropriate for easy access to any Portacath or PICC line.   We strive to give you quality time with your provider. You may need to reschedule your appointment if you arrive late (15 or more minutes).  Arriving late affects you and other patients whose appointments are after yours.  Also, if you miss three or more appointments without notifying the office, you may be dismissed from the clinic at the provider's discretion.      For prescription refill requests, have your pharmacy contact our office and allow 72 hours for refills to be completed.    Today you received the following chemotherapy and/or immunotherapy agents Paclitaxel, Carboplatin   To help prevent nausea and vomiting after your treatment, we encourage you to take your nausea medication as directed.  BELOW ARE SYMPTOMS THAT SHOULD BE REPORTED IMMEDIATELY: *FEVER GREATER THAN 100.4 F (38 C) OR HIGHER *CHILLS OR SWEATING *NAUSEA AND VOMITING THAT IS NOT CONTROLLED WITH YOUR NAUSEA MEDICATION *UNUSUAL SHORTNESS OF BREATH *UNUSUAL BRUISING OR BLEEDING *URINARY PROBLEMS (pain or burning when urinating, or frequent urination) *BOWEL PROBLEMS (unusual diarrhea, constipation, pain near the anus) TENDERNESS IN MOUTH AND THROAT WITH OR WITHOUT PRESENCE OF ULCERS (sore throat, sores in mouth, or a toothache) UNUSUAL RASH, SWELLING OR PAIN  UNUSUAL VAGINAL DISCHARGE OR ITCHING   Items with * indicate a potential emergency and should be followed up as soon as possible or go to the Emergency Department if any problems should occur.  Please show the CHEMOTHERAPY ALERT CARD or  IMMUNOTHERAPY ALERT CARD at check-in to the Emergency Department and triage nurse.  Should you have questions after your visit or need to cancel or reschedule your appointment, please contact [facility] - A DEPT OF [facility]  Dept: [phone]  and follow the prompts.  Office hours are 8:00 a.m. to 4:30 p.m. Monday - Friday. Please note that voicemails left after 4:00 p.m. may not be returned until the following business day.  We are closed weekends and major holidays. You have access to a nurse at all times for urgent questions. Please call the main number to the clinic Dept: [phone] and follow the prompts.   For any non-urgent questions, you may also contact your provider using MyChart. We now offer e-Visits for anyone 1 and older to request care online for non-urgent symptoms. For details visit [email or web address].   Also download the MyChart app! Go to the app store, search "MyChart", open the app, select [facility], and log in with your MyChart username and password.

## 2023-02-07 ENCOUNTER — Ambulatory Visit: Payer: Medicare Other

## 2023-02-07 ENCOUNTER — Other Ambulatory Visit: Payer: Self-pay

## 2023-02-07 ENCOUNTER — Ambulatory Visit
Admission: RE | Admit: 2023-02-07 | Discharge: 2023-02-07 | Disposition: A | Discharge status: Home / Self Care | Payer: Medicare Other | Source: Ambulatory Visit | Attending: Radiation Oncology | Admitting: Radiation Oncology

## 2023-02-07 DIAGNOSIS — M79601 Pain in right arm: Secondary | ICD-10-CM | POA: Diagnosis not present

## 2023-02-07 DIAGNOSIS — M2550 Pain in unspecified joint: Secondary | ICD-10-CM | POA: Diagnosis not present

## 2023-02-07 DIAGNOSIS — Z7902 Long term (current) use of antithrombotics/antiplatelets: Secondary | ICD-10-CM | POA: Diagnosis not present

## 2023-02-07 DIAGNOSIS — Z79899 Other long term (current) drug therapy: Secondary | ICD-10-CM | POA: Diagnosis not present

## 2023-02-07 DIAGNOSIS — C3412 Malignant neoplasm of upper lobe, left bronchus or lung: Secondary | ICD-10-CM | POA: Diagnosis not present

## 2023-02-07 DIAGNOSIS — Z79630 Long term (current) use of alkylating agent: Secondary | ICD-10-CM | POA: Diagnosis not present

## 2023-02-07 DIAGNOSIS — Z79633 Long term (current) use of mitotic inhibitor: Secondary | ICD-10-CM | POA: Diagnosis not present

## 2023-02-07 DIAGNOSIS — F17210 Nicotine dependence, cigarettes, uncomplicated: Secondary | ICD-10-CM | POA: Diagnosis not present

## 2023-02-07 DIAGNOSIS — R5383 Other fatigue: Secondary | ICD-10-CM | POA: Diagnosis not present

## 2023-02-07 DIAGNOSIS — M25511 Pain in right shoulder: Secondary | ICD-10-CM | POA: Diagnosis not present

## 2023-02-07 DIAGNOSIS — E785 Hyperlipidemia, unspecified: Secondary | ICD-10-CM | POA: Diagnosis not present

## 2023-02-07 DIAGNOSIS — Z510 Encounter for antineoplastic radiation therapy: Secondary | ICD-10-CM | POA: Diagnosis not present

## 2023-02-07 DIAGNOSIS — Z86718 Personal history of other venous thrombosis and embolism: Secondary | ICD-10-CM | POA: Diagnosis not present

## 2023-02-07 DIAGNOSIS — M47812 Spondylosis without myelopathy or radiculopathy, cervical region: Secondary | ICD-10-CM | POA: Diagnosis not present

## 2023-02-07 DIAGNOSIS — R590 Localized enlarged lymph nodes: Secondary | ICD-10-CM | POA: Diagnosis not present

## 2023-02-07 DIAGNOSIS — Z5111 Encounter for antineoplastic chemotherapy: Secondary | ICD-10-CM | POA: Diagnosis not present

## 2023-02-07 LAB — RAD ONC ARIA SESSION SUMMARY
Course Elapsed Days: 19
Plan Fractions Treated to Date: 13
Plan Prescribed Dose Per Fraction: 2 Gy
Plan Total Fractions Prescribed: 30
Plan Total Prescribed Dose: 60 Gy
Reference Point Dosage Given to Date: 26 Gy
Reference Point Session Dosage Given: 2 Gy
Session Number: 13

## 2023-02-09 ENCOUNTER — Other Ambulatory Visit: Payer: Self-pay

## 2023-02-09 ENCOUNTER — Encounter: Payer: Self-pay | Admitting: Internal Medicine

## 2023-02-09 ENCOUNTER — Ambulatory Visit
Admission: RE | Admit: 2023-02-09 | Discharge: 2023-02-09 | Disposition: A | Discharge status: Home / Self Care | Payer: Medicare PPO | Source: Ambulatory Visit | Attending: Radiation Oncology | Admitting: Radiation Oncology

## 2023-02-09 DIAGNOSIS — R5383 Other fatigue: Secondary | ICD-10-CM | POA: Insufficient documentation

## 2023-02-09 DIAGNOSIS — C3412 Malignant neoplasm of upper lobe, left bronchus or lung: Secondary | ICD-10-CM | POA: Insufficient documentation

## 2023-02-09 DIAGNOSIS — R590 Localized enlarged lymph nodes: Secondary | ICD-10-CM | POA: Insufficient documentation

## 2023-02-09 DIAGNOSIS — Z86718 Personal history of other venous thrombosis and embolism: Secondary | ICD-10-CM | POA: Insufficient documentation

## 2023-02-09 DIAGNOSIS — M25511 Pain in right shoulder: Secondary | ICD-10-CM | POA: Insufficient documentation

## 2023-02-09 DIAGNOSIS — Z79630 Long term (current) use of alkylating agent: Secondary | ICD-10-CM | POA: Diagnosis not present

## 2023-02-09 DIAGNOSIS — E785 Hyperlipidemia, unspecified: Secondary | ICD-10-CM | POA: Insufficient documentation

## 2023-02-09 DIAGNOSIS — Z7902 Long term (current) use of antithrombotics/antiplatelets: Secondary | ICD-10-CM | POA: Insufficient documentation

## 2023-02-09 DIAGNOSIS — M47812 Spondylosis without myelopathy or radiculopathy, cervical region: Secondary | ICD-10-CM | POA: Diagnosis not present

## 2023-02-09 DIAGNOSIS — Z79899 Other long term (current) drug therapy: Secondary | ICD-10-CM | POA: Diagnosis not present

## 2023-02-09 DIAGNOSIS — M79601 Pain in right arm: Secondary | ICD-10-CM | POA: Insufficient documentation

## 2023-02-09 DIAGNOSIS — Z510 Encounter for antineoplastic radiation therapy: Secondary | ICD-10-CM | POA: Insufficient documentation

## 2023-02-09 DIAGNOSIS — Z9851 Tubal ligation status: Secondary | ICD-10-CM | POA: Diagnosis not present

## 2023-02-09 DIAGNOSIS — M2550 Pain in unspecified joint: Secondary | ICD-10-CM | POA: Insufficient documentation

## 2023-02-09 DIAGNOSIS — Z5111 Encounter for antineoplastic chemotherapy: Secondary | ICD-10-CM | POA: Insufficient documentation

## 2023-02-09 DIAGNOSIS — Z79633 Long term (current) use of mitotic inhibitor: Secondary | ICD-10-CM | POA: Insufficient documentation

## 2023-02-09 LAB — RAD ONC ARIA SESSION SUMMARY
Course Elapsed Days: 21
Plan Fractions Treated to Date: 14
Plan Prescribed Dose Per Fraction: 2 Gy
Plan Total Fractions Prescribed: 30
Plan Total Prescribed Dose: 60 Gy
Reference Point Dosage Given to Date: 28 Gy
Reference Point Session Dosage Given: 2 Gy
Session Number: 14

## 2023-02-10 ENCOUNTER — Other Ambulatory Visit: Payer: Self-pay

## 2023-02-10 ENCOUNTER — Ambulatory Visit
Admission: RE | Admit: 2023-02-10 | Discharge: 2023-02-10 | Disposition: A | Discharge status: Home / Self Care | Payer: Medicare PPO | Source: Ambulatory Visit | Attending: Radiation Oncology | Admitting: Radiation Oncology

## 2023-02-10 DIAGNOSIS — Z510 Encounter for antineoplastic radiation therapy: Secondary | ICD-10-CM | POA: Diagnosis not present

## 2023-02-10 LAB — RAD ONC ARIA SESSION SUMMARY
Course Elapsed Days: 22
Plan Fractions Treated to Date: 15
Plan Prescribed Dose Per Fraction: 2 Gy
Plan Total Fractions Prescribed: 30
Plan Total Prescribed Dose: 60 Gy
Reference Point Dosage Given to Date: 30 Gy
Reference Point Session Dosage Given: 2 Gy
Session Number: 15

## 2023-02-11 ENCOUNTER — Encounter: Payer: Self-pay | Admitting: Internal Medicine

## 2023-02-12 NOTE — Progress Notes (Signed)
 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 Gretzinger returns for follow up and treatment, presenting for week 4 chemoRT. She feels weak and tired, but remains out of bed/active. Still going to the hospital to visit her daughter when she can (early onset dementia awaiting placement). Eating/drinking ok, gets diarrhea at times so not quite sure what to eat. Occasional DOE walking long hallways. Denies new/worsening cough, chest pain, dysphagia, fever/chills, or any other new/specific complaints.   ROS  All other systems reviewed and negative  Past Medical History:  Diagnosis Date   Adenomatous colon polyp    Aneurysm (HCC)    brain x 2   Anxiety    Arthritis    Atherosclerosis    Barrett's esophagus    Carotid artery disease (HCC)    Cataracts, bilateral    just watching   DDD (degenerative disc disease), lumbar    Depression    Diabetes mellitus without complication (HCC)    Diverticulosis    DVT (deep venous thrombosis) (HCC)    left leg   Fall at home 05/30/2014   Pt slipped in tub- hurt left hip and right shoulder   GERD (gastroesophageal reflux disease)    H. pylori infection    Hyperlipidemia    Hypertension    Hypothyroidism    IBS (irritable bowel  syndrome)    Osteoarthritis    Poor sleep 04/28/2020   Pre-diabetes    no meds, diet controlled   RLS (restless legs syndrome)    Tubular adenoma of colon    Ulcer of the stomach and intestine      Past Surgical History:  Procedure Laterality Date   ANAL RECTAL MANOMETRY N/A 05/04/2016   Procedure: ANO RECTAL MANOMETRY;  Surgeon: Gustav Shila GAILS, MD;  Location: [facility] ENDOSCOPY;  Service: Endoscopy;  Laterality: N/A;   ANEURYSM COILING  01/28/2010   stent-assisted coiling LICA aneurysm   BRAIN SURGERY     Per patient  Left side behind eyes   BRONCHIAL BIOPSY  11/21/2022   Procedure: BRONCHIAL BIOPSIES;  Surgeon: Shelah Lamar RAMAN, MD;  Location: [facility] ENDOSCOPY;  Service: Pulmonary;;   BRONCHIAL BRUSHINGS  11/21/2022   Procedure: BRONCHIAL BRUSHINGS;  Surgeon: Shelah Lamar RAMAN, MD;  Location: [facility] ENDOSCOPY;  Service: Pulmonary;;   BRONCHIAL NEEDLE ASPIRATION BIOPSY  11/21/2022   Procedure: BRONCHIAL NEEDLE ASPIRATION BIOPSIES;  Surgeon: Shelah Lamar RAMAN, MD;  Location: [facility] ENDOSCOPY;  Service: Pulmonary;;   BRONCHIAL WASHINGS  11/21/2022   Procedure: BRONCHIAL WASHINGS;  Surgeon: Shelah Lamar RAMAN, MD;  Location: [facility] ENDOSCOPY;  Service: Pulmonary;;   FIDUCIAL MARKER PLACEMENT  11/21/2022   Procedure: FIDUCIAL MARKER PLACEMENT;  Surgeon: Shelah Lamar RAMAN, MD;  Location: [facility] ENDOSCOPY;  Service: Pulmonary;;   INSERTION OF ILIAC STENT  10/04/2012   Procedure: INSERTION OF ILIAC STENT;  Surgeon: Candyce GORMAN Reek, MD;  Location: [facility] CATH LAB;  Service: Cardiovascular;;  Left External Iliac Artery   IR ANGIO INTRA EXTRACRAN SEL COM CAROTID INNOMINATE BILAT MOD SED  11/23/2018   IR ANGIO INTRA EXTRACRAN SEL COM CAROTID INNOMINATE BILAT MOD SED  12/03/2020   IR ANGIO VERTEBRAL SEL SUBCLAVIAN INNOMINATE UNI L MOD SED  11/23/2018   IR ANGIO VERTEBRAL SEL VERTEBRAL BILAT MOD SED  12/03/2020   IR ANGIO VERTEBRAL SEL VERTEBRAL UNI L MOD SED  12/10/2018   IR ANGIO VERTEBRAL SEL VERTEBRAL UNI R MOD SED   11/23/2018   IR GENERIC HISTORICAL  11/03/2015   IR ANGIO VERTEBRAL SEL VERTEBRAL UNI L MOD SED 11/03/2015 Thyra Nash, MD [facility]   IR GENERIC HISTORICAL  11/03/2015   IR ANGIO INTRA EXTRACRAN SEL COM CAROTID INNOMINATE BILAT MOD SED 11/03/2015 Thyra Nash, MD [facility]   IR GENERIC HISTORICAL  11/03/2015   IR ANGIO VERTEBRAL SEL SUBCLAVIAN INNOMINATE UNI R MOD SED 11/03/2015 Thyra Nash, MD [facility]   IR GENERIC HISTORICAL  11/19/2015   IR RADIOLOGIST EVAL & MGMT 11/19/2015 [facility]   IR RADIOLOGIST EVAL & MGMT  02/15/2022   IR US  GUIDE VASC ACCESS RIGHT  11/23/2018   IR US  GUIDE VASC ACCESS RIGHT  12/03/2020   LEG SURGERY     LOWER EXTREMITY ANGIOGRAM Left 10/04/2012   and Left iliac stent   LOWER EXTREMITY ANGIOGRAM N/A 10/04/2012   Procedure: LOWER EXTREMITY ANGIOGRAM;  Surgeon: Candyce GORMAN Reek, MD;  Location: [facility] CATH LAB;  Service: Cardiovascular;  Laterality: N/A;   RADIOLOGY WITH ANESTHESIA N/A 12/10/2018   Procedure: STENTING;  Surgeon: Nash Thyra, MD;  Location: [facility] OR;  Service: Radiology;  Laterality: N/A;   TUBAL LIGATION       Outpatient Encounter Medications as of 02/13/2023  Medication Sig   acetaminophen  (TYLENOL ) 500 MG tablet Take 1,000 mg by mouth 2 (two) times daily as needed for pain.    amLODipine (NORVASC) 10 MG tablet Take 10 mg by mouth daily. for high blood pressure   aspirin  EC 81 MG tablet Take 81 mg by mouth daily.   atorvastatin (LIPITOR) 80 MG tablet Take 80 mg by mouth daily.   buPROPion (WELLBUTRIN SR) 150 MG 12 hr tablet Take 150 mg by mouth 2 (two) times daily.   Calcium-Vitamin D (CALTRATE 600 PLUS-VIT D PO) Take 1 tablet by mouth daily before supper.    Cholecalciferol (VITAMIN D3) 10 MCG (400 UNIT) CAPS Take 400 Units by mouth daily.   clopidogrel  (PLAVIX ) 75 MG tablet Take 1 tablet (75 mg total) by mouth daily with breakfast. Okay to restart on 11/22/2022.   LORazepam  (ATIVAN ) 0.5 MG tablet 1 tablet  p.o. 30 minutes before MRI.  Repeat x 1 if needed   losartan (COZAAR) 50 MG tablet Take 75 mg by mouth daily.   Magnesium 200 MG TABS Take 200 mg by mouth daily.   Multiple Vitamin (MULTIVITAMIN WITH MINERALS) TABS tablet Take 1 tablet by mouth daily before supper. Centrum Silver   ondansetron  (ZOFRAN ) 8 MG tablet Take 8 mg by mouth every 8 (eight) hours as needed.   pantoprazole  (PROTONIX ) 40 MG tablet Take 1 tablet (40 mg total) by mouth daily.   Probiotic Product (PROBIOTIC PO) Take 1 tablet by mouth daily after lunch.    prochlorperazine  (COMPAZINE ) 10 MG tablet Take 1 tablet (10 mg total) by mouth every 6 (six) hours as needed  for nausea or vomiting.   Psyllium (METAMUCIL) 28.3 % POWD Take 1 Dose by mouth daily. 2 teaspoons full = 1 dose   Tetrahydrozoline HCl (VISINE OP) Place 1 drop into both eyes daily as needed (irritation/dry eyes).   vitamin B-12 (CYANOCOBALAMIN ) 1000 MCG tablet Take 1,000 mcg by mouth daily.   vitamin C (ASCORBIC ACID) 500 MG tablet Take 500 mg by mouth daily.   No facility-administered encounter medications on file as of 02/13/2023.     Today's Vitals   02/13/23 1006 02/13/23 1008 02/13/23 1013  BP: (!) 120/45 135/65   Pulse: 78    Resp: 16    Temp: (!) 97.5 F (36.4 C)    TempSrc: Temporal    SpO2: 100%    Weight: 146 lb 14.4 oz (66.6 kg)    Height: 5' 1.6 (1.565 m)    PainSc:   6    Body mass index is 27.22 kg/m.   PHYSICAL EXAM GENERAL:alert, no distress and comfortable SKIN: no rash  EYES: sclera clear LUNGS: clear with normal breathing effort HEART: regular rate & rhythm, no lower extremity edema NEURO: alert & oriented x 3 with fluent speech, no focal motor deficits   CBC    Component Value Date/Time   WBC 3.1 (L) 02/13/2023 0946   WBC 5.6 12/03/2020 1107   RBC 4.66 02/13/2023 0946   HGB 13.7 02/13/2023 0946   HCT 39.9 02/13/2023 0946   PLT 275 02/13/2023 0946   MCV 85.6 02/13/2023 0946   MCH 29.4 02/13/2023 0946   MCHC 34.3  02/13/2023 0946   RDW 16.0 (H) 02/13/2023 0946   LYMPHSABS 0.6 (L) 02/13/2023 0946   MONOABS 0.2 02/13/2023 0946   EOSABS 0.0 02/13/2023 0946   BASOSABS 0.0 02/13/2023 0946     CMP     Component Value Date/Time   NA 140 02/13/2023 0946   K 4.1 02/13/2023 0946   CL 108 02/13/2023 0946   CO2 28 02/13/2023 0946   GLUCOSE 118 (H) 02/13/2023 0946   BUN 8 02/13/2023 0946   CREATININE 0.89 02/13/2023 0946   CALCIUM 9.1 02/13/2023 0946   PROT 6.5 02/13/2023 0946   ALBUMIN  3.7 02/13/2023 0946   AST 17 02/13/2023 0946   ALT 19 02/13/2023 0946   ALKPHOS 93 02/13/2023 0946   BILITOT 0.5 02/13/2023 0946   GFRNONAA >60 02/13/2023 0946   GFRAA >60 12/10/2018 0706     ASSESSMENT & PLAN: 73 yo female   Non small cell lung cancer, SCC, cT1cN1M0 stage IIB -Diagnosed 11/2022 and presented with left upper lobe lung nodule in addition to left hilar lymphadenopathy. -Unresectable, not a good surgical candidate because of poor pulmonary function. -Currently on concurrent chemoRT, radiation started 01/19/23 with weekly carbo/taxol  starting 01/24/23 -Ms. Brusca appears stable. S/p week 3 ccRT, tolerating well with mild fatigue. Able to recover and function with good PS. We reviewed diet/nutrition and symptom management of diarrhea. -Labs reviewed, adequate to continue ccRT and proceed with C4 carbo/taxol  today as scheduled, no dose adjustments -return next week for week 5 carbo/taxol  -F/up with cycle 6 in 2 weeks     PLAN: -Labs reviewed, adequate to continue ccRT -Proceed with C4 carbo/taxol  today as scheduled, no dose adjustments -Reviewed diet/nutrition and symptom management of diarrhea -F/up dietician 1/10 as scheduled -Return next week for week 5 carbo/taxol  -F/up with cycle 6 in 2 weeks    All questions were answered. The patient knows to call the clinic with any problems, questions or concerns. No barriers  to learning were detected.   Brittin Belnap, NP-C 02/13/2023

## 2023-02-13 ENCOUNTER — Other Ambulatory Visit: Payer: Self-pay

## 2023-02-13 ENCOUNTER — Inpatient Hospital Stay: Payer: Medicare PPO

## 2023-02-13 ENCOUNTER — Encounter: Payer: Self-pay | Admitting: Nurse Practitioner

## 2023-02-13 ENCOUNTER — Inpatient Hospital Stay (HOSPITAL_BASED_OUTPATIENT_CLINIC_OR_DEPARTMENT_OTHER): Payer: Medicare Other | Admitting: Nurse Practitioner

## 2023-02-13 ENCOUNTER — Ambulatory Visit
Admission: RE | Admit: 2023-02-13 | Discharge: 2023-02-13 | Disposition: A | Discharge status: Home / Self Care | Payer: Medicare PPO | Source: Ambulatory Visit | Attending: Radiation Oncology | Admitting: Radiation Oncology

## 2023-02-13 VITALS — BP 135/65 | HR 78 | Temp 97.5°F | Resp 16 | Ht 61.6 in | Wt 146.9 lb

## 2023-02-13 DIAGNOSIS — Z79899 Other long term (current) drug therapy: Secondary | ICD-10-CM | POA: Insufficient documentation

## 2023-02-13 DIAGNOSIS — Z7902 Long term (current) use of antithrombotics/antiplatelets: Secondary | ICD-10-CM | POA: Insufficient documentation

## 2023-02-13 DIAGNOSIS — C3412 Malignant neoplasm of upper lobe, left bronchus or lung: Secondary | ICD-10-CM | POA: Diagnosis not present

## 2023-02-13 DIAGNOSIS — Z860101 Personal history of adenomatous and serrated colon polyps: Secondary | ICD-10-CM | POA: Insufficient documentation

## 2023-02-13 DIAGNOSIS — K580 Irritable bowel syndrome with diarrhea: Secondary | ICD-10-CM | POA: Insufficient documentation

## 2023-02-13 DIAGNOSIS — Z883 Allergy status to other anti-infective agents status: Secondary | ICD-10-CM | POA: Insufficient documentation

## 2023-02-13 DIAGNOSIS — R590 Localized enlarged lymph nodes: Secondary | ICD-10-CM | POA: Insufficient documentation

## 2023-02-13 DIAGNOSIS — Z8719 Personal history of other diseases of the digestive system: Secondary | ICD-10-CM | POA: Insufficient documentation

## 2023-02-13 DIAGNOSIS — R0609 Other forms of dyspnea: Secondary | ICD-10-CM | POA: Insufficient documentation

## 2023-02-13 DIAGNOSIS — Z885 Allergy status to narcotic agent status: Secondary | ICD-10-CM | POA: Insufficient documentation

## 2023-02-13 DIAGNOSIS — Z86718 Personal history of other venous thrombosis and embolism: Secondary | ICD-10-CM | POA: Insufficient documentation

## 2023-02-13 DIAGNOSIS — Z881 Allergy status to other antibiotic agents status: Secondary | ICD-10-CM | POA: Insufficient documentation

## 2023-02-13 DIAGNOSIS — Z510 Encounter for antineoplastic radiation therapy: Secondary | ICD-10-CM | POA: Diagnosis not present

## 2023-02-13 DIAGNOSIS — Z923 Personal history of irradiation: Secondary | ICD-10-CM | POA: Insufficient documentation

## 2023-02-13 LAB — RAD ONC ARIA SESSION SUMMARY
Course Elapsed Days: 25
Plan Fractions Treated to Date: 16
Plan Prescribed Dose Per Fraction: 2 Gy
Plan Total Fractions Prescribed: 30
Plan Total Prescribed Dose: 60 Gy
Reference Point Dosage Given to Date: 32 Gy
Reference Point Session Dosage Given: 2 Gy
Session Number: 16

## 2023-02-13 LAB — CBC WITH DIFFERENTIAL (CANCER CENTER ONLY)
Abs Immature Granulocytes: 0.03 10*3/uL (ref 0.00–0.07)
Basophils Absolute: 0 10*3/uL (ref 0.0–0.1)
Basophils Relative: 1 %
Eosinophils Absolute: 0 10*3/uL (ref 0.0–0.5)
Eosinophils Relative: 0 %
HCT: 39.9 % (ref 36.0–46.0)
Hemoglobin: 13.7 g/dL (ref 12.0–15.0)
Immature Granulocytes: 1 %
Lymphocytes Relative: 20 %
Lymphs Abs: 0.6 10*3/uL — ABNORMAL LOW (ref 0.7–4.0)
MCH: 29.4 pg (ref 26.0–34.0)
MCHC: 34.3 g/dL (ref 30.0–36.0)
MCV: 85.6 fL (ref 80.0–100.0)
Monocytes Absolute: 0.2 10*3/uL (ref 0.1–1.0)
Monocytes Relative: 8 %
Neutro Abs: 2.2 10*3/uL (ref 1.7–7.7)
Neutrophils Relative %: 70 %
Platelet Count: 275 10*3/uL (ref 150–400)
RBC: 4.66 MIL/uL (ref 3.87–5.11)
RDW: 16 % — ABNORMAL HIGH (ref 11.5–15.5)
WBC Count: 3.1 10*3/uL — ABNORMAL LOW (ref 4.0–10.5)
nRBC: 0.7 % — ABNORMAL HIGH (ref 0.0–0.2)

## 2023-02-13 LAB — CMP (CANCER CENTER ONLY)
ALT: 19 U/L (ref 0–44)
AST: 17 U/L (ref 15–41)
Albumin: 3.7 g/dL (ref 3.5–5.0)
Alkaline Phosphatase: 93 U/L (ref 38–126)
Anion gap: 4 — ABNORMAL LOW (ref 5–15)
BUN: 8 mg/dL (ref 8–23)
CO2: 28 mmol/L (ref 22–32)
Calcium: 9.1 mg/dL (ref 8.9–10.3)
Chloride: 108 mmol/L (ref 98–111)
Creatinine: 0.89 mg/dL (ref 0.44–1.00)
GFR, Estimated: >60 mL/min (ref >60)
Glucose, Bld: 118 mg/dL — ABNORMAL HIGH (ref 70–99)
Potassium: 4.1 mmol/L (ref 3.5–5.1)
Sodium: 140 mmol/L (ref 135–145)
Total Bilirubin: 0.5 mg/dL (ref 0.0–1.2)
Total Protein: 6.5 g/dL (ref 6.5–8.1)

## 2023-02-13 MED ORDER — DIPHENHYDRAMINE HCL 50 MG/ML IJ SOLN
50.0000 mg | Freq: Once | INTRAMUSCULAR | Status: AC
  Administered 2023-02-13: 50 mg via INTRAVENOUS
  Filled 2023-02-13: qty 1

## 2023-02-13 MED ORDER — SODIUM CHLORIDE 0.9 % IV SOLN
145.4000 mg | Freq: Once | INTRAVENOUS | Status: AC
  Administered 2023-02-13: 150 mg via INTRAVENOUS
  Filled 2023-02-13: qty 15

## 2023-02-13 MED ORDER — DEXAMETHASONE SODIUM PHOSPHATE 10 MG/ML IJ SOLN
10.0000 mg | Freq: Once | INTRAMUSCULAR | Status: AC
  Administered 2023-02-13: 10 mg via INTRAVENOUS
  Filled 2023-02-13: qty 1

## 2023-02-13 MED ORDER — HEPARIN SOD (PORK) LOCK FLUSH 100 UNIT/ML IV SOLN: 500.0000 [IU] | Freq: Once | INTRAVENOUS | Status: DC | PRN

## 2023-02-13 MED ORDER — PALONOSETRON HCL INJECTION 0.25 MG/5ML
0.2500 mg | Freq: Once | INTRAVENOUS | Status: AC
  Administered 2023-02-13: 0.25 mg via INTRAVENOUS
  Filled 2023-02-13: qty 5

## 2023-02-13 MED ORDER — FAMOTIDINE IN NACL 20-0.9 MG/50ML-% IV SOLN
20.0000 mg | Freq: Once | INTRAVENOUS | Status: AC
  Administered 2023-02-13: 20 mg via INTRAVENOUS
  Filled 2023-02-13: qty 50

## 2023-02-13 MED ORDER — SODIUM CHLORIDE 0.9 % IV SOLN
45.0000 mg/m2 | Freq: Once | INTRAVENOUS | Status: AC
  Administered 2023-02-13: 78 mg via INTRAVENOUS
  Filled 2023-02-13: qty 13

## 2023-02-13 MED ORDER — SODIUM CHLORIDE 0.9 % IV SOLN
INTRAVENOUS | Status: DC
Start: 2023-02-13 — End: 2023-02-13

## 2023-02-13 MED ORDER — SODIUM CHLORIDE 0.9% FLUSH: 10.0000 mL | INTRAVENOUS | Status: DC | PRN

## 2023-02-13 NOTE — Patient Instructions (Signed)
 [facility] - A DEPT OF [facility]  Discharge Instructions: Thank you for choosing [facility] to provide your oncology and hematology care.   If you have a lab appointment with the Cancer Center, please go directly to the Cancer Center and check in at the registration area.   Wear comfortable clothing and clothing appropriate for easy access to any Portacath or PICC line.   We strive to give you quality time with your provider. You may need to reschedule your appointment if you arrive late (15 or more minutes).  Arriving late affects you and other patients whose appointments are after yours.  Also, if you miss three or more appointments without notifying the office, you may be dismissed from the clinic at the provider's discretion.      For prescription refill requests, have your pharmacy contact our office and allow 72 hours for refills to be completed.    Today you received the following chemotherapy and/or immunotherapy agents Taxol, Carboplatin      To help prevent nausea and vomiting after your treatment, we encourage you to take your nausea medication as directed.  BELOW ARE SYMPTOMS THAT SHOULD BE REPORTED IMMEDIATELY: *FEVER GREATER THAN 100.4 F (38 C) OR HIGHER *CHILLS OR SWEATING *NAUSEA AND VOMITING THAT IS NOT CONTROLLED WITH YOUR NAUSEA MEDICATION *UNUSUAL SHORTNESS OF BREATH *UNUSUAL BRUISING OR BLEEDING *URINARY PROBLEMS (pain or burning when urinating, or frequent urination) *BOWEL PROBLEMS (unusual diarrhea, constipation, pain near the anus) TENDERNESS IN MOUTH AND THROAT WITH OR WITHOUT PRESENCE OF ULCERS (sore throat, sores in mouth, or a toothache) UNUSUAL RASH, SWELLING OR PAIN  UNUSUAL VAGINAL DISCHARGE OR ITCHING   Items with * indicate a potential emergency and should be followed up as soon as possible or go to the Emergency Department if any problems should occur.  Please show the CHEMOTHERAPY ALERT CARD or  IMMUNOTHERAPY ALERT CARD at check-in to the Emergency Department and triage nurse.  Should you have questions after your visit or need to cancel or reschedule your appointment, please contact [facility] - A DEPT OF [facility]  Dept: [phone]  and follow the prompts.  Office hours are 8:00 a.m. to 4:30 p.m. Monday - Friday. Please note that voicemails left after 4:00 p.m. may not be returned until the following business day.  We are closed weekends and major holidays. You have access to a nurse at all times for urgent questions. Please call the main number to the clinic Dept: [phone] and follow the prompts.   For any non-urgent questions, you may also contact your provider using MyChart. We now offer e-Visits for anyone 64 and older to request care online for non-urgent symptoms. For details visit [email or web address].   Also download the MyChart app! Go to the app store, search "MyChart", open the app, select [place], and log in with your MyChart username and password.

## 2023-02-14 ENCOUNTER — Ambulatory Visit: Payer: Medicare PPO

## 2023-02-14 ENCOUNTER — Other Ambulatory Visit: Payer: Self-pay

## 2023-02-14 ENCOUNTER — Ambulatory Visit
Admission: RE | Admit: 2023-02-14 | Discharge: 2023-02-14 | Disposition: A | Discharge status: Home / Self Care | Payer: Medicare PPO | Source: Ambulatory Visit | Attending: Radiation Oncology | Admitting: Radiation Oncology

## 2023-02-14 DIAGNOSIS — Z510 Encounter for antineoplastic radiation therapy: Secondary | ICD-10-CM | POA: Diagnosis not present

## 2023-02-14 LAB — RAD ONC ARIA SESSION SUMMARY
Course Elapsed Days: 26
Plan Fractions Treated to Date: 17
Plan Prescribed Dose Per Fraction: 2 Gy
Plan Total Fractions Prescribed: 30
Plan Total Prescribed Dose: 60 Gy
Reference Point Dosage Given to Date: 34 Gy
Reference Point Session Dosage Given: 2 Gy
Session Number: 17

## 2023-02-15 ENCOUNTER — Ambulatory Visit
Admission: RE | Admit: 2023-02-15 | Discharge: 2023-02-15 | Disposition: A | Discharge status: Home / Self Care | Payer: Medicare PPO | Source: Ambulatory Visit | Attending: Radiation Oncology | Admitting: Radiation Oncology

## 2023-02-15 ENCOUNTER — Other Ambulatory Visit: Payer: Self-pay

## 2023-02-15 DIAGNOSIS — Z510 Encounter for antineoplastic radiation therapy: Secondary | ICD-10-CM | POA: Diagnosis not present

## 2023-02-15 LAB — RAD ONC ARIA SESSION SUMMARY
Course Elapsed Days: 27
Plan Fractions Treated to Date: 18
Plan Prescribed Dose Per Fraction: 2 Gy
Plan Total Fractions Prescribed: 30
Plan Total Prescribed Dose: 60 Gy
Reference Point Dosage Given to Date: 36 Gy
Reference Point Session Dosage Given: 2 Gy
Session Number: 18

## 2023-02-16 ENCOUNTER — Ambulatory Visit: Payer: Medicare PPO

## 2023-02-16 ENCOUNTER — Other Ambulatory Visit: Payer: Self-pay

## 2023-02-16 ENCOUNTER — Ambulatory Visit
Admission: RE | Admit: 2023-02-16 | Discharge: 2023-02-16 | Disposition: A | Discharge status: Home / Self Care | Payer: Medicare PPO | Source: Ambulatory Visit | Attending: Radiation Oncology | Admitting: Radiation Oncology

## 2023-02-16 DIAGNOSIS — Z510 Encounter for antineoplastic radiation therapy: Secondary | ICD-10-CM | POA: Diagnosis not present

## 2023-02-16 LAB — RAD ONC ARIA SESSION SUMMARY
Course Elapsed Days: 28
Plan Fractions Treated to Date: 19
Plan Prescribed Dose Per Fraction: 2 Gy
Plan Total Fractions Prescribed: 30
Plan Total Prescribed Dose: 60 Gy
Reference Point Dosage Given to Date: 38 Gy
Reference Point Session Dosage Given: 2 Gy
Session Number: 19

## 2023-02-17 ENCOUNTER — Other Ambulatory Visit: Payer: Self-pay

## 2023-02-17 ENCOUNTER — Ambulatory Visit
Admission: RE | Admit: 2023-02-17 | Discharge: 2023-02-17 | Disposition: A | Discharge status: Home / Self Care | Payer: Medicare PPO | Source: Ambulatory Visit | Attending: Radiation Oncology | Admitting: Radiation Oncology

## 2023-02-17 ENCOUNTER — Telehealth: Payer: Self-pay | Admitting: Dietician

## 2023-02-17 ENCOUNTER — Encounter: Payer: Self-pay | Admitting: Internal Medicine

## 2023-02-17 ENCOUNTER — Ambulatory Visit: Payer: Medicare PPO

## 2023-02-17 ENCOUNTER — Inpatient Hospital Stay: Payer: Medicare PPO | Admitting: Dietician

## 2023-02-17 DIAGNOSIS — Z510 Encounter for antineoplastic radiation therapy: Secondary | ICD-10-CM | POA: Diagnosis not present

## 2023-02-17 LAB — RAD ONC ARIA SESSION SUMMARY
Course Elapsed Days: 29
Plan Fractions Treated to Date: 20
Plan Prescribed Dose Per Fraction: 2 Gy
Plan Total Fractions Prescribed: 30
Plan Total Prescribed Dose: 60 Gy
Reference Point Dosage Given to Date: 40 Gy
Reference Point Session Dosage Given: 2 Gy
Session Number: 20

## 2023-02-17 NOTE — Progress Notes (Signed)
 Followed up with patient while covering for Lenise in her absence regarding an expense submitted for the [employer] to cover.  Called to introduce myself as Dance Movement Psychotherapist and provided information provided by [employer]. Also, emailed a copy of the check request confirmation to front desk to provide to patient at her visit today.  Provided patient with my name and direct number for any additional questions or concerns in this matter.

## 2023-02-17 NOTE — Telephone Encounter (Signed)
 Nutrition Assessment   Reason for Assessment: Referral    ASSESSMENT: 73 year old female with SCC of bronchus in left upper lobe. She is receiving concurrent chemoradiation with weekly carbo/taxol  (first chemo 12/17)  Spoke with patient via telephone. She reports feeling tired, but otherwise tolerating treatment well. Patient endorses intermittent diarrhea. She is unsure of what to eat anymore because she doesn't know what her stomach is going to do. Patient eating small amounts 4-5 times a day. Likes fruit (banana, applesauce). Patient reports tolerating Ensure and likes these, however they are so high and unable to purchase these. Patient states she has gained some weight since starting treatment. She does not want to keep gaining.   Nutrition Focused Physical Exam: deferred    Medications: amlodipine, lipitor, caltrate 600, D3, ativan , cozaar, zofran , protonix , compazine , B12   Labs: 1/6 - reviewed    Anthropometrics:   Height: 5'1.6 Weight: 146 lb 14.4 oz  UBW: 140-145 lb  BMI: 27.22   NUTRITION DIAGNOSIS: Food and nutrition related knowledge deficit related to cancer and associated treatment side effects as evidenced by no prior need for related nutrition information    INTERVENTION:  Discussed strategies for diarrhea, foods best tolerated and foods to limit/avoid Encouraged 4-6 small meals with good sources of lean proteins Strive for wt maintenance  Recommend one ensure plus/equivalent (as able) for added calories/protein - samples of Ensure Complete + coupons provided   Bag with handouts and samples left with RT to provide pt at treatment today (1/10)  MONITORING, EVALUATION, GOAL: Pt will tolerate adequate calories and protein to minimize    Next Visit: Monday January 20 during infusion

## 2023-02-20 ENCOUNTER — Inpatient Hospital Stay: Payer: Medicare PPO | Admitting: Nutrition

## 2023-02-20 ENCOUNTER — Inpatient Hospital Stay: Payer: Medicare PPO

## 2023-02-20 ENCOUNTER — Ambulatory Visit
Admission: RE | Admit: 2023-02-20 | Discharge: 2023-02-20 | Disposition: A | Discharge status: Home / Self Care | Payer: Medicare PPO | Source: Ambulatory Visit | Attending: Radiation Oncology | Admitting: Radiation Oncology

## 2023-02-20 ENCOUNTER — Other Ambulatory Visit: Payer: Self-pay

## 2023-02-20 ENCOUNTER — Inpatient Hospital Stay: Payer: Medicare PPO | Admitting: Licensed Clinical Social Worker

## 2023-02-20 ENCOUNTER — Ambulatory Visit: Payer: Medicare Other | Admitting: Physician Assistant

## 2023-02-20 VITALS — BP 138/61 | HR 72 | Temp 98.5°F | Resp 18 | Wt 147.5 lb

## 2023-02-20 DIAGNOSIS — C3412 Malignant neoplasm of upper lobe, left bronchus or lung: Secondary | ICD-10-CM

## 2023-02-20 DIAGNOSIS — Z510 Encounter for antineoplastic radiation therapy: Secondary | ICD-10-CM | POA: Diagnosis not present

## 2023-02-20 LAB — RAD ONC ARIA SESSION SUMMARY
Course Elapsed Days: 32
Plan Fractions Treated to Date: 21
Plan Prescribed Dose Per Fraction: 2 Gy
Plan Total Fractions Prescribed: 30
Plan Total Prescribed Dose: 60 Gy
Reference Point Dosage Given to Date: 42 Gy
Reference Point Session Dosage Given: 2 Gy
Session Number: 21

## 2023-02-20 LAB — CBC WITH DIFFERENTIAL (CANCER CENTER ONLY)
Abs Immature Granulocytes: 0.02 10*3/uL (ref 0.00–0.07)
Basophils Absolute: 0 10*3/uL (ref 0.0–0.1)
Basophils Relative: 1 %
Eosinophils Absolute: 0 10*3/uL (ref 0.0–0.5)
Eosinophils Relative: 0 %
HCT: 38.5 % (ref 36.0–46.0)
Hemoglobin: 12.9 g/dL (ref 12.0–15.0)
Immature Granulocytes: 1 %
Lymphocytes Relative: 16 %
Lymphs Abs: 0.5 10*3/uL — ABNORMAL LOW (ref 0.7–4.0)
MCH: 28.7 pg (ref 26.0–34.0)
MCHC: 33.5 g/dL (ref 30.0–36.0)
MCV: 85.6 fL (ref 80.0–100.0)
Monocytes Absolute: 0.3 10*3/uL (ref 0.1–1.0)
Monocytes Relative: 9 %
Neutro Abs: 2.4 10*3/uL (ref 1.7–7.7)
Neutrophils Relative %: 73 %
Platelet Count: 199 10*3/uL (ref 150–400)
RBC: 4.5 MIL/uL (ref 3.87–5.11)
RDW: 16.3 % — ABNORMAL HIGH (ref 11.5–15.5)
WBC Count: 3.2 10*3/uL — ABNORMAL LOW (ref 4.0–10.5)
nRBC: 0.9 % — ABNORMAL HIGH (ref 0.0–0.2)

## 2023-02-20 LAB — CMP (CANCER CENTER ONLY)
ALT: 21 U/L (ref 0–44)
AST: 23 U/L (ref 15–41)
Albumin: 3.7 g/dL (ref 3.5–5.0)
Alkaline Phosphatase: 83 U/L (ref 38–126)
Anion gap: 5 (ref 5–15)
BUN: 25 mg/dL — ABNORMAL HIGH (ref 8–23)
CO2: 26 mmol/L (ref 22–32)
Calcium: 8.9 mg/dL (ref 8.9–10.3)
Chloride: 108 mmol/L (ref 98–111)
Creatinine: 0.88 mg/dL (ref 0.44–1.00)
GFR, Estimated: >60 mL/min (ref >60)
Glucose, Bld: 103 mg/dL — ABNORMAL HIGH (ref 70–99)
Potassium: 4 mmol/L (ref 3.5–5.1)
Sodium: 139 mmol/L (ref 135–145)
Total Bilirubin: 0.4 mg/dL (ref 0.0–1.2)
Total Protein: 6.4 g/dL — ABNORMAL LOW (ref 6.5–8.1)

## 2023-02-20 MED ORDER — PALONOSETRON HCL INJECTION 0.25 MG/5ML
0.2500 mg | Freq: Once | INTRAVENOUS | Status: AC
  Administered 2023-02-20: 0.25 mg via INTRAVENOUS
  Filled 2023-02-20: qty 5

## 2023-02-20 MED ORDER — FAMOTIDINE IN NACL 20-0.9 MG/50ML-% IV SOLN
20.0000 mg | Freq: Once | INTRAVENOUS | Status: AC
  Administered 2023-02-20: 20 mg via INTRAVENOUS
  Filled 2023-02-20: qty 50

## 2023-02-20 MED ORDER — SODIUM CHLORIDE 0.9 % IV SOLN: INTRAVENOUS | Status: DC

## 2023-02-20 MED ORDER — DEXAMETHASONE SODIUM PHOSPHATE 10 MG/ML IJ SOLN
10.0000 mg | Freq: Once | INTRAMUSCULAR | Status: AC
  Administered 2023-02-20: 10 mg via INTRAVENOUS
  Filled 2023-02-20: qty 1

## 2023-02-20 MED ORDER — SODIUM CHLORIDE 0.9 % IV SOLN
145.4000 mg | Freq: Once | INTRAVENOUS | Status: AC
  Administered 2023-02-20: 150 mg via INTRAVENOUS
  Filled 2023-02-20: qty 15

## 2023-02-20 MED ORDER — SODIUM CHLORIDE 0.9 % IV SOLN
45.0000 mg/m2 | Freq: Once | INTRAVENOUS | Status: AC
  Administered 2023-02-20: 78 mg via INTRAVENOUS
  Filled 2023-02-20: qty 13

## 2023-02-20 MED ORDER — DIPHENHYDRAMINE HCL 50 MG/ML IJ SOLN
50.0000 mg | Freq: Once | INTRAMUSCULAR | Status: AC
  Administered 2023-02-20: 50 mg via INTRAVENOUS
  Filled 2023-02-20: qty 1

## 2023-02-20 NOTE — Progress Notes (Signed)
 Patient requested nutrition follow-up today during infusion.  She is a 73 year old female with SCC of bronchus and left upper lobe receiving concurrent chemoradiation therapy with weekly carbo/Taxol .  Weight documented as 147 pounds 8 ounces January 13.  This is increased overall and has been trending up.  Patient states she does not want to gain any more weight.  Patient has questions about oral nutrition supplements.  States she really likes Ensure and wonders how many she can drink every day.  Reports diarrhea has much improved.  Labs noted: Glucose 103, BUN 25.  Nutrition diagnosis: Food and nutrition related knowledge deficit, ongoing.  Intervention: Recommended patient limit oral nutrition supplements to 1 bottle daily.  Encouraged her to use original Ensure or Ensure max.   Provided a coupon. Encourage patient to ask questions or voice other concerns.  Monitoring, evaluation, goals: Patient will tolerate adequate calories and protein to promote weight stabilization.  Next visit: To be scheduled as needed.  **Disclaimer: This note was dictated with voice recognition software. Similar sounding words can inadvertently be transcribed and this note may contain transcription errors which may not have been corrected upon publication of note.**

## 2023-02-20 NOTE — Patient Instructions (Signed)
 [facility] - A DEPT OF [facility]  Discharge Instructions: Thank you for choosing [facility] to provide your oncology and hematology care.   If you have a lab appointment with the Cancer Center, please go directly to the Cancer Center and check in at the registration area.   Wear comfortable clothing and clothing appropriate for easy access to any Portacath or PICC line.   We strive to give you quality time with your provider. You may need to reschedule your appointment if you arrive late (15 or more minutes).  Arriving late affects you and other patients whose appointments are after yours.  Also, if you miss three or more appointments without notifying the office, you may be dismissed from the clinic at the provider's discretion.      For prescription refill requests, have your pharmacy contact our office and allow 72 hours for refills to be completed.    Today you received the following chemotherapy and/or immunotherapy agents: Taxol /Carbo.   To help prevent nausea and vomiting after your treatment, we encourage you to take your nausea medication as directed.  BELOW ARE SYMPTOMS THAT SHOULD BE REPORTED IMMEDIATELY: *FEVER GREATER THAN 100.4 F (38 C) OR HIGHER *CHILLS OR SWEATING *NAUSEA AND VOMITING THAT IS NOT CONTROLLED WITH YOUR NAUSEA MEDICATION *UNUSUAL SHORTNESS OF BREATH *UNUSUAL BRUISING OR BLEEDING *URINARY PROBLEMS (pain or burning when urinating, or frequent urination) *BOWEL PROBLEMS (unusual diarrhea, constipation, pain near the anus) TENDERNESS IN MOUTH AND THROAT WITH OR WITHOUT PRESENCE OF ULCERS (sore throat, sores in mouth, or a toothache) UNUSUAL RASH, SWELLING OR PAIN  UNUSUAL VAGINAL DISCHARGE OR ITCHING   Items with * indicate a potential emergency and should be followed up as soon as possible or go to the Emergency Department if any problems should occur.  Please show the CHEMOTHERAPY ALERT CARD or IMMUNOTHERAPY  ALERT CARD at check-in to the Emergency Department and triage nurse.  Should you have questions after your visit or need to cancel or reschedule your appointment, please contact [facility] - A DEPT OF [facility]  Dept: [phone]  and follow the prompts.  Office hours are 8:00 a.m. to 4:30 p.m. Monday - Friday. Please note that voicemails left after 4:00 p.m. may not be returned until the following business day.  We are closed weekends and major holidays. You have access to a nurse at all times for urgent questions. Please call the main number to the clinic Dept: [phone] and follow the prompts.   For any non-urgent questions, you may also contact your provider using MyChart. We now offer e-Visits for anyone 44 and older to request care online for non-urgent symptoms. For details visit [email or web address].   Also download the MyChart app! Go to the app store, search MyChart, open the app, select [place], and log in with your MyChart username and password.

## 2023-02-20 NOTE — Progress Notes (Signed)
 Patient's IV came out L hand during 30-minute wait prior to taxol.  Elease Etienne RN attempted IV attempt x 2, unsuccessful.  IV team paged, IV started RFA 22 gauge.

## 2023-02-21 ENCOUNTER — Encounter: Payer: Self-pay | Admitting: *Deleted

## 2023-02-21 ENCOUNTER — Encounter: Payer: Self-pay | Admitting: Internal Medicine

## 2023-02-21 ENCOUNTER — Other Ambulatory Visit: Payer: Self-pay

## 2023-02-21 ENCOUNTER — Ambulatory Visit: Payer: Medicare PPO

## 2023-02-21 ENCOUNTER — Ambulatory Visit
Admission: RE | Admit: 2023-02-21 | Discharge: 2023-02-21 | Disposition: A | Discharge status: Home / Self Care | Payer: Medicare PPO | Source: Ambulatory Visit | Attending: Radiation Oncology | Admitting: Radiation Oncology

## 2023-02-21 DIAGNOSIS — Z510 Encounter for antineoplastic radiation therapy: Secondary | ICD-10-CM | POA: Diagnosis not present

## 2023-02-21 LAB — RAD ONC ARIA SESSION SUMMARY
Course Elapsed Days: 33
Plan Fractions Treated to Date: 22
Plan Prescribed Dose Per Fraction: 2 Gy
Plan Total Fractions Prescribed: 30
Plan Total Prescribed Dose: 60 Gy
Reference Point Dosage Given to Date: 44 Gy
Reference Point Session Dosage Given: 2 Gy
Session Number: 22

## 2023-02-21 NOTE — Progress Notes (Signed)
 [facility] CSW Progress Note  Clinical Social Worker  saw pt and daughter in infusion.  Pt inquiring about submission of bill for January's rent as well as a car repair bill which she understood would be paid directly to the mechanic.  Message sent to patient financial resource specialist to follow up.  CSW to remain available as appropriate to provide support throughout duration of treatment.    SABRA Devere JONELLE Elois, LCSW Clinical Social Worker [facility]

## 2023-02-22 ENCOUNTER — Other Ambulatory Visit: Payer: Self-pay

## 2023-02-22 ENCOUNTER — Ambulatory Visit
Admission: RE | Admit: 2023-02-22 | Discharge: 2023-02-22 | Disposition: A | Discharge status: Home / Self Care | Payer: Medicare PPO | Source: Ambulatory Visit | Attending: Radiation Oncology | Admitting: Radiation Oncology

## 2023-02-22 DIAGNOSIS — Z510 Encounter for antineoplastic radiation therapy: Secondary | ICD-10-CM | POA: Diagnosis not present

## 2023-02-22 LAB — RAD ONC ARIA SESSION SUMMARY
Course Elapsed Days: 34
Plan Fractions Treated to Date: 23
Plan Prescribed Dose Per Fraction: 2 Gy
Plan Total Fractions Prescribed: 30
Plan Total Prescribed Dose: 60 Gy
Reference Point Dosage Given to Date: 46 Gy
Reference Point Session Dosage Given: 1.7644 Gy
Session Number: 23

## 2023-02-23 ENCOUNTER — Ambulatory Visit
Admission: RE | Admit: 2023-02-23 | Discharge: 2023-02-23 | Disposition: A | Discharge status: Home / Self Care | Payer: Medicare PPO | Source: Ambulatory Visit | Attending: Radiation Oncology | Admitting: Radiation Oncology

## 2023-02-23 ENCOUNTER — Other Ambulatory Visit: Payer: Self-pay

## 2023-02-23 ENCOUNTER — Encounter: Payer: Self-pay | Admitting: *Deleted

## 2023-02-23 DIAGNOSIS — Z510 Encounter for antineoplastic radiation therapy: Secondary | ICD-10-CM | POA: Diagnosis not present

## 2023-02-23 LAB — RAD ONC ARIA SESSION SUMMARY
Course Elapsed Days: 35
Plan Fractions Treated to Date: 24
Plan Prescribed Dose Per Fraction: 2 Gy
Plan Total Fractions Prescribed: 30
Plan Total Prescribed Dose: 60 Gy
Reference Point Dosage Given to Date: 48 Gy
Reference Point Session Dosage Given: 2 Gy
Session Number: 24

## 2023-02-24 ENCOUNTER — Encounter: Payer: Self-pay | Admitting: Internal Medicine

## 2023-02-24 ENCOUNTER — Other Ambulatory Visit: Payer: Self-pay

## 2023-02-24 ENCOUNTER — Ambulatory Visit
Admission: RE | Admit: 2023-02-24 | Discharge: 2023-02-24 | Disposition: A | Discharge status: Home / Self Care | Payer: Medicare PPO | Source: Ambulatory Visit | Attending: Radiation Oncology | Admitting: Radiation Oncology

## 2023-02-24 DIAGNOSIS — Z510 Encounter for antineoplastic radiation therapy: Secondary | ICD-10-CM | POA: Diagnosis not present

## 2023-02-24 LAB — RAD ONC ARIA SESSION SUMMARY
Course Elapsed Days: 36
Plan Fractions Treated to Date: 25
Plan Prescribed Dose Per Fraction: 2 Gy
Plan Total Fractions Prescribed: 30
Plan Total Prescribed Dose: 60 Gy
Reference Point Dosage Given to Date: 50 Gy
Reference Point Session Dosage Given: 2 Gy
Session Number: 25

## 2023-02-27 ENCOUNTER — Ambulatory Visit
Admission: RE | Admit: 2023-02-27 | Discharge: 2023-02-27 | Disposition: A | Discharge status: Home / Self Care | Payer: Medicare PPO | Source: Ambulatory Visit | Attending: Radiation Oncology | Admitting: Radiation Oncology

## 2023-02-27 ENCOUNTER — Ambulatory Visit: Payer: Medicare PPO

## 2023-02-27 ENCOUNTER — Other Ambulatory Visit: Payer: Self-pay

## 2023-02-27 ENCOUNTER — Inpatient Hospital Stay: Payer: Medicare PPO

## 2023-02-27 ENCOUNTER — Inpatient Hospital Stay: Payer: Medicare PPO | Admitting: Nutrition

## 2023-02-27 ENCOUNTER — Inpatient Hospital Stay (HOSPITAL_BASED_OUTPATIENT_CLINIC_OR_DEPARTMENT_OTHER): Payer: Medicare PPO | Admitting: Internal Medicine

## 2023-02-27 VITALS — BP 134/45 | HR 76 | Temp 98.4°F | Resp 16 | Ht 61.6 in | Wt 149.9 lb

## 2023-02-27 DIAGNOSIS — Z510 Encounter for antineoplastic radiation therapy: Secondary | ICD-10-CM | POA: Diagnosis not present

## 2023-02-27 DIAGNOSIS — C3412 Malignant neoplasm of upper lobe, left bronchus or lung: Secondary | ICD-10-CM

## 2023-02-27 DIAGNOSIS — C3490 Malignant neoplasm of unspecified part of unspecified bronchus or lung: Secondary | ICD-10-CM | POA: Diagnosis not present

## 2023-02-27 LAB — CBC WITH DIFFERENTIAL (CANCER CENTER ONLY)
Abs Immature Granulocytes: 0.03 10*3/uL (ref 0.00–0.07)
Basophils Absolute: 0 10*3/uL (ref 0.0–0.1)
Basophils Relative: 1 %
Eosinophils Absolute: 0 10*3/uL (ref 0.0–0.5)
Eosinophils Relative: 0 %
HCT: 40.2 % (ref 36.0–46.0)
Hemoglobin: 13.2 g/dL (ref 12.0–15.0)
Immature Granulocytes: 1 %
Lymphocytes Relative: 13 %
Lymphs Abs: 0.4 10*3/uL — ABNORMAL LOW (ref 0.7–4.0)
MCH: 28.7 pg (ref 26.0–34.0)
MCHC: 32.8 g/dL (ref 30.0–36.0)
MCV: 87.4 fL (ref 80.0–100.0)
Monocytes Absolute: 0.2 10*3/uL (ref 0.1–1.0)
Monocytes Relative: 9 %
Neutro Abs: 2.2 10*3/uL (ref 1.7–7.7)
Neutrophils Relative %: 76 %
Platelet Count: 161 10*3/uL (ref 150–400)
RBC: 4.6 MIL/uL (ref 3.87–5.11)
RDW: 17.3 % — ABNORMAL HIGH (ref 11.5–15.5)
WBC Count: 2.8 10*3/uL — ABNORMAL LOW (ref 4.0–10.5)
nRBC: 1.1 % — ABNORMAL HIGH (ref 0.0–0.2)

## 2023-02-27 LAB — CMP (CANCER CENTER ONLY)
ALT: 18 U/L (ref 0–44)
AST: 19 U/L (ref 15–41)
Albumin: 3.7 g/dL (ref 3.5–5.0)
Alkaline Phosphatase: 81 U/L (ref 38–126)
Anion gap: 4 — ABNORMAL LOW (ref 5–15)
BUN: 15 mg/dL (ref 8–23)
CO2: 28 mmol/L (ref 22–32)
Calcium: 9.1 mg/dL (ref 8.9–10.3)
Chloride: 106 mmol/L (ref 98–111)
Creatinine: 0.94 mg/dL (ref 0.44–1.00)
GFR, Estimated: >60 mL/min (ref >60)
Glucose, Bld: 113 mg/dL — ABNORMAL HIGH (ref 70–99)
Potassium: 4.3 mmol/L (ref 3.5–5.1)
Sodium: 138 mmol/L (ref 135–145)
Total Bilirubin: 0.5 mg/dL (ref 0.0–1.2)
Total Protein: 6.5 g/dL (ref 6.5–8.1)

## 2023-02-27 LAB — RAD ONC ARIA SESSION SUMMARY
Course Elapsed Days: 39
Plan Fractions Treated to Date: 26
Plan Prescribed Dose Per Fraction: 2 Gy
Plan Total Fractions Prescribed: 30
Plan Total Prescribed Dose: 60 Gy
Reference Point Dosage Given to Date: 52 Gy
Reference Point Session Dosage Given: 2 Gy
Session Number: 26

## 2023-02-27 MED ORDER — SODIUM CHLORIDE 0.9 % IV SOLN
45.0000 mg/m2 | Freq: Once | INTRAVENOUS | Status: AC
  Administered 2023-02-27: 78 mg via INTRAVENOUS
  Filled 2023-02-27: qty 13

## 2023-02-27 MED ORDER — PALONOSETRON HCL INJECTION 0.25 MG/5ML
0.2500 mg | Freq: Once | INTRAVENOUS | Status: AC
  Administered 2023-02-27: 0.25 mg via INTRAVENOUS
  Filled 2023-02-27: qty 5

## 2023-02-27 MED ORDER — FAMOTIDINE IN NACL 20-0.9 MG/50ML-% IV SOLN
20.0000 mg | Freq: Once | INTRAVENOUS | Status: AC
  Administered 2023-02-27: 20 mg via INTRAVENOUS
  Filled 2023-02-27: qty 50

## 2023-02-27 MED ORDER — SODIUM CHLORIDE 0.9 % IV SOLN: INTRAVENOUS | Status: DC

## 2023-02-27 MED ORDER — CARBOPLATIN CHEMO INJECTION 450 MG/45ML
145.4000 mg | Freq: Once | INTRAVENOUS | Status: AC
  Administered 2023-02-27: 150 mg via INTRAVENOUS
  Filled 2023-02-27: qty 15

## 2023-02-27 MED ORDER — DEXAMETHASONE SODIUM PHOSPHATE 10 MG/ML IJ SOLN
10.0000 mg | Freq: Once | INTRAMUSCULAR | Status: AC
  Administered 2023-02-27: 10 mg via INTRAVENOUS
  Filled 2023-02-27: qty 1

## 2023-02-27 MED ORDER — DIPHENHYDRAMINE HCL 50 MG/ML IJ SOLN
50.0000 mg | Freq: Once | INTRAMUSCULAR | Status: AC
  Administered 2023-02-27: 50 mg via INTRAVENOUS
  Filled 2023-02-27: qty 1

## 2023-02-27 NOTE — Progress Notes (Signed)
[facility] [phone]   [phone]  OFFICE PROGRESS NOTE  Vicki Beams, MD [phone] [address] [place] [address]  DIAGNOSIS: Unresectable stage IIb (T1c, N1, M0) non-small cell lung cancer, squamous cell carcinoma diagnosed in October 2024 and presented with left upper lobe lung nodule in addition to left hilar lymphadenopathy.  The patient not a good surgical candidate because of poor pulmonary function.  PRIOR THERAPY: None  CURRENT THERAPY: A course of concurrent chemoradiation with weekly carboplatin for AUC of 2 and paclitaxel 45 Mg/M2.  Status post 5 cycles.  INTERVAL HISTORY: Vicki Page 73 y.o. female returns to the clinic today for follow-up visit. Discussed the use of AI scribe software for clinical note transcription with the patient, who gave verbal consent to proceed.  History of Present Illness   Vicki Page, a 73 year old patient with a diagnosis of unresectable stage 2B non-small cell lung cancer, squamous cell carcinoma, has been undergoing chemotherapy and radiation with weekly carboplatin and paclitaxel. The patient has completed five cycles of treatment and is due for the sixth and final cycle.  The patient reports feeling weak most of the time, describing it as a general body weakness. This weakness is a new symptom that has developed since the initiation of the treatment. The patient denies experiencing headaches, nausea, vomiting, or diarrhea. However, she reports irregular bowel movements, which she attributes to uncertainty about dietary choices during treatment.  The patient has noticed an increase in body size, which she believes is due to the steroids administered before chemotherapy. She also reports a dark spot on her skin, which she noticed recently and attributes to radiation treatment.  The patient has been advised to avoid spicy food and food that is either very hot or very cold, as these could  affect swallowing. The patient expresses concern about the presence of lymph nodes and the possibility of residual tumor density after radiation treatment.       MEDICAL HISTORY: Past Medical History:  Diagnosis Date   Adenomatous colon polyp    Aneurysm (HCC)    brain x 2   Anxiety    Arthritis    Atherosclerosis    Barrett's esophagus    Carotid artery disease (HCC)    Cataracts, bilateral    just watching   DDD (degenerative disc disease), lumbar    Depression    Diabetes mellitus without complication (HCC)    Diverticulosis    DVT (deep venous thrombosis) (HCC)    left leg   Fall at home 05/30/2014   Pt slipped in tub- hurt left hip and right shoulder   GERD (gastroesophageal reflux disease)    H. pylori infection    Hyperlipidemia    Hypertension    Hypothyroidism    IBS (irritable bowel syndrome)    Osteoarthritis    Poor sleep 04/28/2020   Pre-diabetes    no meds, diet controlled   RLS (restless legs syndrome)    Tubular adenoma of colon    Ulcer of the stomach and intestine     ALLERGIES:  is allergic to fluconazole, ropinirole, codeine, darvon [propoxyphene hcl], and flagyl [metronidazole].  MEDICATIONS:  Current Outpatient Medications  Medication Sig Dispense Refill   acetaminophen (TYLENOL) 500 MG tablet Take 1,000 mg by mouth 2 (two) times daily as needed for pain.      amLODipine (NORVASC) 10 MG tablet Take 10 mg by mouth daily. for high blood pressure  aspirin EC 81 MG tablet Take 81 mg by mouth daily.     atorvastatin (LIPITOR) 80 MG tablet Take 80 mg by mouth daily.     buPROPion (WELLBUTRIN SR) 150 MG 12 hr tablet Take 150 mg by mouth 2 (two) times daily.     Calcium-Vitamin D (CALTRATE 600 PLUS-VIT D PO) Take 1 tablet by mouth daily before supper.      Cholecalciferol (VITAMIN D3) 10 MCG (400 UNIT) CAPS Take 400 Units by mouth daily.     clopidogrel (PLAVIX) 75 MG tablet Take 1 tablet (75 mg total) by mouth daily with breakfast. Okay to restart  on 11/22/2022.     LORazepam (ATIVAN) 0.5 MG tablet 1 tablet p.o. 30 minutes before MRI.  Repeat x 1 if needed 2 tablet 0   losartan (COZAAR) 50 MG tablet Take 75 mg by mouth daily.     Magnesium 200 MG TABS Take 200 mg by mouth daily.     Multiple Vitamin (MULTIVITAMIN WITH MINERALS) TABS tablet Take 1 tablet by mouth daily before supper. Centrum Silver     ondansetron (ZOFRAN) 8 MG tablet Take 8 mg by mouth every 8 (eight) hours as needed.     pantoprazole (PROTONIX) 40 MG tablet Take 1 tablet (40 mg total) by mouth daily. 30 tablet 2   Probiotic Product (PROBIOTIC PO) Take 1 tablet by mouth daily after lunch.      prochlorperazine (COMPAZINE) 10 MG tablet Take 1 tablet (10 mg total) by mouth every 6 (six) hours as needed for nausea or vomiting. 30 tablet 1   Psyllium (METAMUCIL) 28.3 % POWD Take 1 Dose by mouth daily. 2 teaspoons full = 1 dose     Tetrahydrozoline HCl (VISINE OP) Place 1 drop into both eyes daily as needed (irritation/dry eyes).     vitamin B-12 (CYANOCOBALAMIN) 1000 MCG tablet Take 1,000 mcg by mouth daily.     vitamin C (ASCORBIC ACID) 500 MG tablet Take 500 mg by mouth daily.     No current facility-administered medications for this visit.    SURGICAL HISTORY:  Past Surgical History:  Procedure Laterality Date   ANAL RECTAL MANOMETRY N/A 05/04/2016   Procedure: ANO RECTAL MANOMETRY;  Surgeon: Napoleon Form, MD;  Location: [facility] ENDOSCOPY;  Service: Endoscopy;  Laterality: N/A;   ANEURYSM COILING  01/28/2010   stent-assisted coiling LICA aneurysm   BRAIN SURGERY     Per patient " Left side behind eyes   BRONCHIAL BIOPSY  11/21/2022   Procedure: BRONCHIAL BIOPSIES;  Surgeon: Leslye Peer, MD;  Location: [facility] ENDOSCOPY;  Service: Pulmonary;;   BRONCHIAL BRUSHINGS  11/21/2022   Procedure: BRONCHIAL BRUSHINGS;  Surgeon: Leslye Peer, MD;  Location: [facility] ENDOSCOPY;  Service: Pulmonary;;   BRONCHIAL NEEDLE ASPIRATION BIOPSY  11/21/2022   Procedure: BRONCHIAL NEEDLE  ASPIRATION BIOPSIES;  Surgeon: Leslye Peer, MD;  Location: [facility] ENDOSCOPY;  Service: Pulmonary;;   BRONCHIAL WASHINGS  11/21/2022   Procedure: BRONCHIAL WASHINGS;  Surgeon: Leslye Peer, MD;  Location: [facility] ENDOSCOPY;  Service: Pulmonary;;   FIDUCIAL MARKER PLACEMENT  11/21/2022   Procedure: FIDUCIAL MARKER PLACEMENT;  Surgeon: Leslye Peer, MD;  Location: [facility] ENDOSCOPY;  Service: Pulmonary;;   INSERTION OF ILIAC STENT  10/04/2012   Procedure: INSERTION OF ILIAC STENT;  Surgeon: Corky Crafts, MD;  Location: [facility] CATH LAB;  Service: Cardiovascular;;  Left External Iliac Artery   IR ANGIO INTRA EXTRACRAN SEL COM CAROTID INNOMINATE BILAT MOD SED  11/23/2018   IR  ANGIO INTRA EXTRACRAN SEL COM CAROTID INNOMINATE BILAT MOD SED  12/03/2020   IR ANGIO VERTEBRAL SEL SUBCLAVIAN INNOMINATE UNI L MOD SED  11/23/2018   IR ANGIO VERTEBRAL SEL VERTEBRAL BILAT MOD SED  12/03/2020   IR ANGIO VERTEBRAL SEL VERTEBRAL UNI L MOD SED  12/10/2018   IR ANGIO VERTEBRAL SEL VERTEBRAL UNI R MOD SED  11/23/2018   IR GENERIC HISTORICAL  11/03/2015   IR ANGIO VERTEBRAL SEL VERTEBRAL UNI L MOD SED 11/03/2015 Julieanne Cotton, MD [facility]   IR GENERIC HISTORICAL  11/03/2015   IR ANGIO INTRA EXTRACRAN SEL COM CAROTID INNOMINATE BILAT MOD SED 11/03/2015 Julieanne Cotton, MD [facility]   IR GENERIC HISTORICAL  11/03/2015   IR ANGIO VERTEBRAL SEL SUBCLAVIAN INNOMINATE UNI R MOD SED 11/03/2015 Julieanne Cotton, MD [facility]   IR GENERIC HISTORICAL  11/19/2015   IR RADIOLOGIST EVAL & MGMT 11/19/2015 [facility]   IR RADIOLOGIST EVAL & MGMT  02/15/2022   IR US GUIDE VASC ACCESS RIGHT  11/23/2018   IR US GUIDE VASC ACCESS RIGHT  12/03/2020   LEG SURGERY     LOWER EXTREMITY ANGIOGRAM Left 10/04/2012   and Left iliac stent   LOWER EXTREMITY ANGIOGRAM N/A 10/04/2012   Procedure: LOWER EXTREMITY ANGIOGRAM;  Surgeon: Corky Crafts, MD;  Location: [facility] CATH LAB;  Service: Cardiovascular;  Laterality: N/A;    RADIOLOGY WITH ANESTHESIA N/A 12/10/2018   Procedure: STENTING;  Surgeon: Julieanne Cotton, MD;  Location: [facility] OR;  Service: Radiology;  Laterality: N/A;   TUBAL LIGATION      REVIEW OF SYSTEMS:  A comprehensive review of systems was negative except for: Constitutional: positive for fatigue   PHYSICAL EXAMINATION: General appearance: alert, cooperative, fatigued, and no distress Head: Normocephalic, without obvious abnormality, atraumatic Neck: no adenopathy, no JVD, supple, symmetrical, trachea midline, and thyroid not enlarged, symmetric, no tenderness/mass/nodules Lymph nodes: Cervical, supraclavicular, and axillary nodes normal. Resp: clear to auscultation bilaterally Back: symmetric, no curvature. ROM normal. No CVA tenderness. Cardio: regular rate and rhythm, S1, S2 normal, no murmur, click, rub or gallop GI: soft, non-tender; bowel sounds normal; no masses,  no organomegaly Extremities: extremities normal, atraumatic, no cyanosis or edema  ECOG PERFORMANCE STATUS: 0 - Asymptomatic  Blood pressure (!) 134/45, pulse 76, temperature 98.4 F (36.9 C), temperature source Temporal, resp. rate 16, height 5' 1.6" (1.565 m), weight 149 lb 14.4 oz (68 kg), SpO2 99%.  LABORATORY DATA: Lab Results  Component Value Date   WBC 2.8 (L) 02/27/2023   HGB 13.2 02/27/2023   HCT 40.2 02/27/2023   MCV 87.4 02/27/2023   PLT 161 02/27/2023      Chemistry      Component Value Date/Time   NA 139 02/20/2023 0850   K 4.0 02/20/2023 0850   CL 108 02/20/2023 0850   CO2 26 02/20/2023 0850   BUN 25 (H) 02/20/2023 0850   CREATININE 0.88 02/20/2023 0850      Component Value Date/Time   CALCIUM 8.9 02/20/2023 0850   ALKPHOS 83 02/20/2023 0850   AST 23 02/20/2023 0850   ALT 21 02/20/2023 0850   BILITOT 0.4 02/20/2023 0850       RADIOGRAPHIC STUDIES: No results found.   ASSESSMENT AND PLAN: This is a 73 years old African-American female with Unresectable stage IIb (T1c, N1, M0)  non-small cell lung cancer, squamous cell carcinoma diagnosed in October 2024 and presented with left upper lobe lung nodule in addition to left hilar lymphadenopathy.  The patient not  a good surgical candidate because of poor pulmonary function.  She has been very angry and frustrated about the delay in her diagnosis when she found to have a small nodule in her lungs last year on a screening scan.  She has been bringing her frustration on [place] since the first time I saw her in October 2024.  The patient is currently on a course of concurrent chemoradiation with weekly carboplatin for AUC of 2 and paclitaxel 45 Mg/M2 status post 5 cycles.  She has been tolerating her treatment well except for fatigue.    Unresectable Stage IIB Non-Small Cell Lung Cancer (NSCLC), Squamous Cell Carcinoma   73 year old undergoing treatment for unresectable stage IIB NSCLC, squamous cell carcinoma, diagnosed October 2024. Currently receiving weekly carboplatin and paclitaxel chemotherapy with radiation therapy. Today is the sixth and final chemotherapy cycle; last radiation session on March 03, 2023. Reports generalized weakness, denies headaches, nausea, vomiting, or diarrhea. Noted dark skin spot likely from radiation. Experienced weight gain, likely due to steroids. Discussed potential for residual density on post-treatment scan due to radiation scarring, not necessarily indicating live tumor. Scan will assess treatment efficacy; scarring is common.   - Administer sixth and final chemotherapy cycle today   - Continue radiation therapy until March 03, 2023   - Schedule chest scan in one month to assess treatment efficacy   - Follow-up in five weeks to review scan results and discuss further management   - Advise avoiding spicy, very hot, and very cold foods to prevent swallowing difficulties    General Health Maintenance   Advised on dietary modifications to prevent swallowing difficulties. Reassured she can return to  regular diet post-treatment.   - Return to regular healthy diet post-treatment.   The patient was advised to call immediately if she has any other concerning symptoms in the interval. The patient voices understanding of current disease status and treatment options and is in agreement with the current care plan.  All questions were answered. The patient knows to call the clinic with any problems, questions or concerns. We can certainly see the patient much sooner if necessary.  The total time spent in the appointment was 20 minutes.  Disclaimer: This note was dictated with voice recognition software. Similar sounding words can inadvertently be transcribed and may not be corrected upon review.

## 2023-02-27 NOTE — Patient Instructions (Signed)
 [facility] - A DEPT OF [facility]  Discharge Instructions: Thank you for choosing [facility] to provide your oncology and hematology care.   If you have a lab appointment with the Cancer Center, please go directly to the Cancer Center and check in at the registration area.   Wear comfortable clothing and clothing appropriate for easy access to any Portacath or PICC line.   We strive to give you quality time with your provider. You may need to reschedule your appointment if you arrive late (15 or more minutes).  Arriving late affects you and other patients whose appointments are after yours.  Also, if you miss three or more appointments without notifying the office, you may be dismissed from the clinic at the provider's discretion.      For prescription refill requests, have your pharmacy contact our office and allow 72 hours for refills to be completed.    Today you received the following chemotherapy and/or immunotherapy agents Taxol/Carbo      To help prevent nausea and vomiting after your treatment, we encourage you to take your nausea medication as directed.  BELOW ARE SYMPTOMS THAT SHOULD BE REPORTED IMMEDIATELY: *FEVER GREATER THAN 100.4 F (38 C) OR HIGHER *CHILLS OR SWEATING *NAUSEA AND VOMITING THAT IS NOT CONTROLLED WITH YOUR NAUSEA MEDICATION *UNUSUAL SHORTNESS OF BREATH *UNUSUAL BRUISING OR BLEEDING *URINARY PROBLEMS (pain or burning when urinating, or frequent urination) *BOWEL PROBLEMS (unusual diarrhea, constipation, pain near the anus) TENDERNESS IN MOUTH AND THROAT WITH OR WITHOUT PRESENCE OF ULCERS (sore throat, sores in mouth, or a toothache) UNUSUAL RASH, SWELLING OR PAIN  UNUSUAL VAGINAL DISCHARGE OR ITCHING   Items with * indicate a potential emergency and should be followed up as soon as possible or go to the Emergency Department if any problems should occur.  Please show the CHEMOTHERAPY ALERT CARD or IMMUNOTHERAPY  ALERT CARD at check-in to the Emergency Department and triage nurse.  Should you have questions after your visit or need to cancel or reschedule your appointment, please contact [facility] - A DEPT [facility]  Dept: [phone]  and follow the prompts.  Office hours are 8:00 a.m. to 4:30 p.m. Monday - Friday. Please note that voicemails left after 4:00 p.m. may not be returned until the following business day.  We are closed weekends and major holidays. You have access to a nurse at all times for urgent questions. Please call the main number to the clinic Dept: [phone] and follow the prompts.   For any non-urgent questions, you may also contact your provider using MyChart. We now offer e-Visits for anyone 42 and older to request care online for non-urgent symptoms. For details visit [email or web address].   Also download the MyChart app! Go to the app store, search "MyChart", open the app, select [facility] [place], and log in with your MyChart username and password.

## 2023-02-28 ENCOUNTER — Encounter: Payer: Self-pay | Admitting: *Deleted

## 2023-02-28 ENCOUNTER — Telehealth: Payer: Self-pay | Admitting: Internal Medicine

## 2023-02-28 ENCOUNTER — Ambulatory Visit
Admission: RE | Admit: 2023-02-28 | Discharge: 2023-02-28 | Disposition: A | Discharge status: Home / Self Care | Payer: Medicare PPO | Source: Ambulatory Visit | Attending: Radiation Oncology | Admitting: Radiation Oncology

## 2023-02-28 ENCOUNTER — Other Ambulatory Visit: Payer: Self-pay

## 2023-02-28 DIAGNOSIS — Z510 Encounter for antineoplastic radiation therapy: Secondary | ICD-10-CM | POA: Diagnosis not present

## 2023-02-28 LAB — RAD ONC ARIA SESSION SUMMARY
Course Elapsed Days: 40
Plan Fractions Treated to Date: 27
Plan Prescribed Dose Per Fraction: 2 Gy
Plan Total Fractions Prescribed: 30
Plan Total Prescribed Dose: 60 Gy
Reference Point Dosage Given to Date: 54 Gy
Reference Point Session Dosage Given: 2 Gy
Session Number: 27

## 2023-03-01 ENCOUNTER — Other Ambulatory Visit: Payer: Self-pay

## 2023-03-01 ENCOUNTER — Ambulatory Visit
Admission: RE | Admit: 2023-03-01 | Discharge: 2023-03-01 | Disposition: A | Discharge status: Home / Self Care | Payer: Medicare PPO | Source: Ambulatory Visit | Attending: Radiation Oncology | Admitting: Radiation Oncology

## 2023-03-01 DIAGNOSIS — Z510 Encounter for antineoplastic radiation therapy: Secondary | ICD-10-CM | POA: Diagnosis not present

## 2023-03-01 LAB — RAD ONC ARIA SESSION SUMMARY
Course Elapsed Days: 41
Plan Fractions Treated to Date: 28
Plan Prescribed Dose Per Fraction: 2 Gy
Plan Total Fractions Prescribed: 30
Plan Total Prescribed Dose: 60 Gy
Reference Point Dosage Given to Date: 56 Gy
Reference Point Session Dosage Given: 2 Gy
Session Number: 28

## 2023-03-02 ENCOUNTER — Ambulatory Visit
Admission: RE | Admit: 2023-03-02 | Discharge: 2023-03-02 | Disposition: A | Discharge status: Home / Self Care | Payer: Medicare PPO | Source: Ambulatory Visit | Attending: Radiation Oncology | Admitting: Radiation Oncology

## 2023-03-02 ENCOUNTER — Other Ambulatory Visit: Payer: Self-pay

## 2023-03-02 DIAGNOSIS — Z510 Encounter for antineoplastic radiation therapy: Secondary | ICD-10-CM | POA: Diagnosis not present

## 2023-03-02 LAB — RAD ONC ARIA SESSION SUMMARY
Course Elapsed Days: 42
Plan Fractions Treated to Date: 29
Plan Prescribed Dose Per Fraction: 2 Gy
Plan Total Fractions Prescribed: 30
Plan Total Prescribed Dose: 60 Gy
Reference Point Dosage Given to Date: 58 Gy
Reference Point Session Dosage Given: 2 Gy
Session Number: 29

## 2023-03-03 ENCOUNTER — Ambulatory Visit: Payer: Medicare PPO

## 2023-03-03 ENCOUNTER — Other Ambulatory Visit: Payer: Self-pay

## 2023-03-03 ENCOUNTER — Ambulatory Visit
Admission: RE | Admit: 2023-03-03 | Discharge: 2023-03-03 | Disposition: A | Discharge status: Home / Self Care | Payer: Medicare PPO | Source: Ambulatory Visit | Attending: Radiation Oncology | Admitting: Radiation Oncology

## 2023-03-03 DIAGNOSIS — Z510 Encounter for antineoplastic radiation therapy: Secondary | ICD-10-CM | POA: Diagnosis not present

## 2023-03-03 LAB — RAD ONC ARIA SESSION SUMMARY
Course Elapsed Days: 43
Plan Fractions Treated to Date: 30
Plan Prescribed Dose Per Fraction: 2 Gy
Plan Total Fractions Prescribed: 30
Plan Total Prescribed Dose: 60 Gy
Reference Point Dosage Given to Date: 60 Gy
Reference Point Session Dosage Given: 2 Gy
Session Number: 30

## 2023-03-06 ENCOUNTER — Ambulatory Visit: Payer: Medicare PPO

## 2023-03-06 NOTE — Radiation Completion Notes (Addendum)
  Radiation Oncology         [phone] ________________________________  Name: Vicki Page MRN: [id number]  Date of Service: 03/03/2023  DOB: 03/31/1950  End of Treatment Note     Diagnosis: Stage IIB, cT1cN1M0, NSCLC, squamous cell carcinoma of the LUL  Intent: Curative     ==========DELIVERED PLANS==========  First Treatment Date: 2023-01-19 Last Treatment Date: 2023-03-03   Plan Name: Lung_L Site: Bronchus, Left Technique: 3D Mode: Photon Dose Per Fraction: 2 Gy Prescribed Dose (Delivered / Prescribed): 60 Gy / 60 Gy Prescribed Fxs (Delivered / Prescribed): 30 / 30     ==========ON TREATMENT VISIT DATES========== 2023-01-24, 2023-01-27, 2023-02-07, 2023-02-14, 2023-02-21, 2023-02-28   See weekly On Treatment Notes in Epic for details in the Media tab (listed as Progress notes on the On Treatment Visit Dates listed above). The patient tolerated radiation. She developed fatigue and anticipated skin changes in the treatment field. No esophagitis was noted at the conclusion of treatment.  The patient will follow up in one month, and will continue follow up with Dr. Arbutus Ped as well.      Osker Mason, PAC

## 2023-03-07 ENCOUNTER — Ambulatory Visit: Payer: Medicare PPO

## 2023-03-23 ENCOUNTER — Encounter: Payer: Self-pay | Admitting: Internal Medicine

## 2023-03-30 ENCOUNTER — Inpatient Hospital Stay: Payer: Medicare Other | Attending: Radiation Oncology

## 2023-03-30 ENCOUNTER — Ambulatory Visit (HOSPITAL_COMMUNITY): Payer: Medicare Other

## 2023-03-30 ENCOUNTER — Telehealth: Payer: Self-pay | Admitting: Internal Medicine

## 2023-03-30 NOTE — Telephone Encounter (Signed)
Pt confirmed lab and CT appts next month. She complained of waiting on her dtr to bring her to hospital to see her other dtr. " I guess I should start walking".  She also complained of runny nose , mild nosebleeds and "stomach pain". Denies N/V/D/C/ fever. I told her to contact PCP or go to Urgent care today.

## 2023-03-30 NOTE — Telephone Encounter (Signed)
[facility] [place] Clinical Research Coordinator note:   This visit for Subject Vicki Page with DOB: 04-Feb-1951 on 03/30/2023. Subject contacted regarding [id number] to inform that Principal Investigator has changed to Dr. Delton Coombes. Voicemail left requesting a return call.

## 2023-04-03 ENCOUNTER — Inpatient Hospital Stay: Payer: Medicare Other | Attending: Internal Medicine

## 2023-04-03 ENCOUNTER — Ambulatory Visit (HOSPITAL_COMMUNITY)
Admission: RE | Admit: 2023-04-03 | Discharge: 2023-04-03 | Disposition: A | Discharge status: Home / Self Care | Payer: Medicare Other | Source: Ambulatory Visit | Attending: Internal Medicine | Admitting: Internal Medicine

## 2023-04-03 DIAGNOSIS — M47814 Spondylosis without myelopathy or radiculopathy, thoracic region: Secondary | ICD-10-CM | POA: Insufficient documentation

## 2023-04-03 DIAGNOSIS — C3412 Malignant neoplasm of upper lobe, left bronchus or lung: Secondary | ICD-10-CM | POA: Diagnosis not present

## 2023-04-03 DIAGNOSIS — K580 Irritable bowel syndrome with diarrhea: Secondary | ICD-10-CM | POA: Diagnosis not present

## 2023-04-03 DIAGNOSIS — Z7902 Long term (current) use of antithrombotics/antiplatelets: Secondary | ICD-10-CM | POA: Diagnosis not present

## 2023-04-03 DIAGNOSIS — Z8719 Personal history of other diseases of the digestive system: Secondary | ICD-10-CM | POA: Insufficient documentation

## 2023-04-03 DIAGNOSIS — Z86718 Personal history of other venous thrombosis and embolism: Secondary | ICD-10-CM | POA: Diagnosis not present

## 2023-04-03 DIAGNOSIS — I700 Atherosclerosis of aorta: Secondary | ICD-10-CM | POA: Insufficient documentation

## 2023-04-03 DIAGNOSIS — R590 Localized enlarged lymph nodes: Secondary | ICD-10-CM | POA: Diagnosis not present

## 2023-04-03 DIAGNOSIS — Z885 Allergy status to narcotic agent status: Secondary | ICD-10-CM | POA: Diagnosis not present

## 2023-04-03 DIAGNOSIS — C3490 Malignant neoplasm of unspecified part of unspecified bronchus or lung: Secondary | ICD-10-CM | POA: Insufficient documentation

## 2023-04-03 DIAGNOSIS — Z79899 Other long term (current) drug therapy: Secondary | ICD-10-CM | POA: Insufficient documentation

## 2023-04-03 DIAGNOSIS — Z860101 Personal history of adenomatous and serrated colon polyps: Secondary | ICD-10-CM | POA: Diagnosis not present

## 2023-04-03 DIAGNOSIS — K2100 Gastro-esophageal reflux disease with esophagitis, without bleeding: Secondary | ICD-10-CM | POA: Insufficient documentation

## 2023-04-03 DIAGNOSIS — Z923 Personal history of irradiation: Secondary | ICD-10-CM | POA: Insufficient documentation

## 2023-04-03 DIAGNOSIS — R0981 Nasal congestion: Secondary | ICD-10-CM | POA: Insufficient documentation

## 2023-04-03 DIAGNOSIS — R509 Fever, unspecified: Secondary | ICD-10-CM | POA: Diagnosis not present

## 2023-04-03 DIAGNOSIS — J439 Emphysema, unspecified: Secondary | ICD-10-CM | POA: Insufficient documentation

## 2023-04-03 DIAGNOSIS — Z9221 Personal history of antineoplastic chemotherapy: Secondary | ICD-10-CM | POA: Diagnosis not present

## 2023-04-03 DIAGNOSIS — Z883 Allergy status to other anti-infective agents status: Secondary | ICD-10-CM | POA: Diagnosis not present

## 2023-04-03 DIAGNOSIS — Z881 Allergy status to other antibiotic agents status: Secondary | ICD-10-CM | POA: Diagnosis not present

## 2023-04-03 DIAGNOSIS — I2510 Atherosclerotic heart disease of native coronary artery without angina pectoris: Secondary | ICD-10-CM | POA: Insufficient documentation

## 2023-04-03 LAB — CBC WITH DIFFERENTIAL (CANCER CENTER ONLY)
Abs Immature Granulocytes: 0.02 10*3/uL (ref 0.00–0.07)
Basophils Absolute: 0 10*3/uL (ref 0.0–0.1)
Basophils Relative: 0 %
Eosinophils Absolute: 0 10*3/uL (ref 0.0–0.5)
Eosinophils Relative: 0 %
HCT: 40.3 % (ref 36.0–46.0)
Hemoglobin: 13 g/dL (ref 12.0–15.0)
Immature Granulocytes: 0 %
Lymphocytes Relative: 14 %
Lymphs Abs: 0.7 10*3/uL (ref 0.7–4.0)
MCH: 28.8 pg (ref 26.0–34.0)
MCHC: 32.3 g/dL (ref 30.0–36.0)
MCV: 89.4 fL (ref 80.0–100.0)
Monocytes Absolute: 0.5 10*3/uL (ref 0.1–1.0)
Monocytes Relative: 10 %
Neutro Abs: 3.5 10*3/uL (ref 1.7–7.7)
Neutrophils Relative %: 76 %
Platelet Count: 273 10*3/uL (ref 150–400)
RBC: 4.51 MIL/uL (ref 3.87–5.11)
RDW: 18.7 % — ABNORMAL HIGH (ref 11.5–15.5)
WBC Count: 4.7 10*3/uL (ref 4.0–10.5)
nRBC: 0 % (ref 0.0–0.2)

## 2023-04-03 LAB — CMP (CANCER CENTER ONLY)
ALT: 16 U/L (ref 0–44)
AST: 17 U/L (ref 15–41)
Albumin: 3.6 g/dL (ref 3.5–5.0)
Alkaline Phosphatase: 87 U/L (ref 38–126)
Anion gap: 5 (ref 5–15)
BUN: 12 mg/dL (ref 8–23)
CO2: 27 mmol/L (ref 22–32)
Calcium: 9.3 mg/dL (ref 8.9–10.3)
Chloride: 110 mmol/L (ref 98–111)
Creatinine: 1.05 mg/dL — ABNORMAL HIGH (ref 0.44–1.00)
GFR, Estimated: 56 mL/min — ABNORMAL LOW (ref >60)
Glucose, Bld: 121 mg/dL — ABNORMAL HIGH (ref 70–99)
Potassium: 4.4 mmol/L (ref 3.5–5.1)
Sodium: 142 mmol/L (ref 135–145)
Total Bilirubin: 0.4 mg/dL (ref 0.0–1.2)
Total Protein: 7 g/dL (ref 6.5–8.1)

## 2023-04-03 MED ORDER — IOHEXOL 300 MG/ML  SOLN
75.0000 mL | Freq: Once | INTRAMUSCULAR | Status: AC | PRN
  Administered 2023-04-03: 75 mL via INTRAVENOUS

## 2023-04-03 NOTE — Progress Notes (Unsigned)
 [facility] OFFICE PROGRESS NOTE  Aliene Beams, MD [phone] [address] [place] [address]  DIAGNOSIS: Unresectable stage IIb (T1c, N1, M0) non-small cell lung cancer, squamous cell carcinoma diagnosed in October 2024 and presented with left upper lobe lung nodule in addition to left hilar lymphadenopathy. The patient not a good surgical candidate because of poor pulmonary function.   PRIOR THERAPY: A course of concurrent chemoradiation with weekly carboplatin for AUC of 2 and paclitaxel 45 Mg/M2.  Status post  6 cycles. Last dose on 02/27/23.   CURRENT THERAPY: Observation   INTERVAL HISTORY: Vicki Page 73 y.o. female returns to the clinic today for a follow-up visit.  The patient was last seen in the clinic on 02/27/2023 by Dr. Arbutus Ped.  The patient has stage II non-small cell lung cancer.  She completed a course of concurrent chemoradiation on 02/27/2023.  She had some esophagitis from radiation but it seems to be improving.  She may have some difficulty swallowing first thing in the morning.  She also has fatigue.  She has a lot of stressors in her life with her daughter with dementia.   She had a cold a few weeks ago but has recovered at this time.  She had nasal congestion, sore throat, and a mild cough.  She also had a low-grade fever with a temperature 100.9.  She is feeling better with that regard at this time.  Her only concern today is related to fatigue and taste alterations since undergoing treatment.  Foods that she used to like are not as appealing or appetizing to her at this time.  She is eating fruit which does taste good to her.  She is no longer having any fevers.  She denies any chills.  It looks like she gained some weight.  She reports her breathing is "okay".  She is no longer having cough.  Denies any chest pain or hemoptysis.  Denies any nausea or vomiting.  She does sometimes alternate between diarrhea and constipation but overall she states  her bowels seem to be doing "okay" at this time.  Denies any headache or visual changes.  She recently had a restaging CT scan.  She is here today for evaluation and to review her scan results and discuss next steps.   MEDICAL HISTORY: Past Medical History:  Diagnosis Date   Adenomatous colon polyp    Aneurysm (HCC)    brain x 2   Anxiety    Arthritis    Atherosclerosis    Barrett's esophagus    Carotid artery disease (HCC)    Cataracts, bilateral    just watching   DDD (degenerative disc disease), lumbar    Depression    Diabetes mellitus without complication (HCC)    Diverticulosis    DVT (deep venous thrombosis) (HCC)    left leg   Fall at home 05/30/2014   Pt slipped in tub- hurt left hip and right shoulder   GERD (gastroesophageal reflux disease)    H. pylori infection    Hyperlipidemia    Hypertension    Hypothyroidism    IBS (irritable bowel syndrome)    Osteoarthritis    Poor sleep 04/28/2020   Pre-diabetes    no meds, diet controlled   RLS (restless legs syndrome)    Tubular adenoma of colon    Ulcer of the stomach and intestine     ALLERGIES:  is allergic to fluconazole, ropinirole, codeine, darvon [propoxyphene hcl], and flagyl [metronidazole].  MEDICATIONS:  Current Outpatient Medications  Medication Sig Dispense Refill   acetaminophen (TYLENOL) 500 MG tablet Take 1,000 mg by mouth 2 (two) times daily as needed for pain.      amLODipine (NORVASC) 10 MG tablet Take 10 mg by mouth daily. for high blood pressure     aspirin EC 81 MG tablet Take 81 mg by mouth daily.     atorvastatin (LIPITOR) 80 MG tablet Take 80 mg by mouth daily.     buPROPion (WELLBUTRIN SR) 150 MG 12 hr tablet Take 150 mg by mouth 2 (two) times daily.     Calcium-Vitamin D (CALTRATE 600 PLUS-VIT D PO) Take 1 tablet by mouth daily before supper.      Cholecalciferol (VITAMIN D3) 10 MCG (400 UNIT) CAPS Take 400 Units by mouth daily.     clopidogrel (PLAVIX) 75 MG tablet Take 1 tablet (75  mg total) by mouth daily with breakfast. Okay to restart on 11/22/2022.     LORazepam (ATIVAN) 0.5 MG tablet 1 tablet p.o. 30 minutes before MRI.  Repeat x 1 if needed 2 tablet 0   losartan (COZAAR) 50 MG tablet Take 75 mg by mouth daily.     Magnesium 200 MG TABS Take 200 mg by mouth daily.     Multiple Vitamin (MULTIVITAMIN WITH MINERALS) TABS tablet Take 1 tablet by mouth daily before supper. Centrum Silver     ondansetron (ZOFRAN) 8 MG tablet Take 8 mg by mouth every 8 (eight) hours as needed.     pantoprazole (PROTONIX) 40 MG tablet Take 1 tablet (40 mg total) by mouth daily. 30 tablet 2   Probiotic Product (PROBIOTIC PO) Take 1 tablet by mouth daily after lunch.      prochlorperazine (COMPAZINE) 10 MG tablet Take 1 tablet (10 mg total) by mouth every 6 (six) hours as needed for nausea or vomiting. 30 tablet 1   Psyllium (METAMUCIL) 28.3 % POWD Take 1 Dose by mouth daily. 2 teaspoons full = 1 dose     Tetrahydrozoline HCl (VISINE OP) Place 1 drop into both eyes daily as needed (irritation/dry eyes).     vitamin B-12 (CYANOCOBALAMIN) 1000 MCG tablet Take 1,000 mcg by mouth daily.     vitamin C (ASCORBIC ACID) 500 MG tablet Take 500 mg by mouth daily.     No current facility-administered medications for this visit.    SURGICAL HISTORY:  Past Surgical History:  Procedure Laterality Date   ANAL RECTAL MANOMETRY N/A 05/04/2016   Procedure: ANO RECTAL MANOMETRY;  Surgeon: Napoleon Form, MD;  Location: [facility] ENDOSCOPY;  Service: Endoscopy;  Laterality: N/A;   ANEURYSM COILING  01/28/2010   stent-assisted coiling LICA aneurysm   BRAIN SURGERY     Per patient " Left side behind eyes   BRONCHIAL BIOPSY  11/21/2022   Procedure: BRONCHIAL BIOPSIES;  Surgeon: Leslye Peer, MD;  Location: [facility] ENDOSCOPY;  Service: Pulmonary;;   BRONCHIAL BRUSHINGS  11/21/2022   Procedure: BRONCHIAL BRUSHINGS;  Surgeon: Leslye Peer, MD;  Location: [facility] ENDOSCOPY;  Service: Pulmonary;;   BRONCHIAL NEEDLE  ASPIRATION BIOPSY  11/21/2022   Procedure: BRONCHIAL NEEDLE ASPIRATION BIOPSIES;  Surgeon: Leslye Peer, MD;  Location: [facility] ENDOSCOPY;  Service: Pulmonary;;   BRONCHIAL WASHINGS  11/21/2022   Procedure: BRONCHIAL WASHINGS;  Surgeon: Leslye Peer, MD;  Location: [facility] ENDOSCOPY;  Service: Pulmonary;;   FIDUCIAL MARKER PLACEMENT  11/21/2022   Procedure: FIDUCIAL MARKER PLACEMENT;  Surgeon: Leslye Peer, MD;  Location: [facility] ENDOSCOPY;  Service: Pulmonary;;   INSERTION OF ILIAC STENT  10/04/2012   Procedure: INSERTION OF ILIAC STENT;  Surgeon: Corky Crafts, MD;  Location: [facility] CATH LAB;  Service: Cardiovascular;;  Left External Iliac Artery   IR ANGIO INTRA EXTRACRAN SEL COM CAROTID INNOMINATE BILAT MOD SED  11/23/2018   IR ANGIO INTRA EXTRACRAN SEL COM CAROTID INNOMINATE BILAT MOD SED  12/03/2020   IR ANGIO VERTEBRAL SEL SUBCLAVIAN INNOMINATE UNI L MOD SED  11/23/2018   IR ANGIO VERTEBRAL SEL VERTEBRAL BILAT MOD SED  12/03/2020   IR ANGIO VERTEBRAL SEL VERTEBRAL UNI L MOD SED  12/10/2018   IR ANGIO VERTEBRAL SEL VERTEBRAL UNI R MOD SED  11/23/2018   IR GENERIC HISTORICAL  11/03/2015   IR ANGIO VERTEBRAL SEL VERTEBRAL UNI L MOD SED 11/03/2015 Julieanne Cotton, MD [facility]   IR GENERIC HISTORICAL  11/03/2015   IR ANGIO INTRA EXTRACRAN SEL COM CAROTID INNOMINATE BILAT MOD SED 11/03/2015 Julieanne Cotton, MD [facility]   IR GENERIC HISTORICAL  11/03/2015   IR ANGIO VERTEBRAL SEL SUBCLAVIAN INNOMINATE UNI R MOD SED 11/03/2015 Julieanne Cotton, MD [facility]   IR GENERIC HISTORICAL  11/19/2015   IR RADIOLOGIST EVAL & MGMT 11/19/2015 [facility]   IR RADIOLOGIST EVAL & MGMT  02/15/2022   IR US GUIDE VASC ACCESS RIGHT  11/23/2018   IR US GUIDE VASC ACCESS RIGHT  12/03/2020   LEG SURGERY     LOWER EXTREMITY ANGIOGRAM Left 10/04/2012   and Left iliac stent   LOWER EXTREMITY ANGIOGRAM N/A 10/04/2012   Procedure: LOWER EXTREMITY ANGIOGRAM;  Surgeon: Corky Crafts, MD;   Location: [facility] CATH LAB;  Service: Cardiovascular;  Laterality: N/A;   RADIOLOGY WITH ANESTHESIA N/A 12/10/2018   Procedure: STENTING;  Surgeon: Julieanne Cotton, MD;  Location: [facility] OR;  Service: Radiology;  Laterality: N/A;   TUBAL LIGATION      REVIEW OF SYSTEMS:   Review of Systems  Constitutional: Positive for fatigue and taste alterations. Negative for chills, fever and unexpected weight change.  HENT: Positive for taste alterations. Negative for mouth sores, nosebleeds, sore throat and trouble swallowing.   Eyes: Negative for eye problems and icterus.  Respiratory: Negative for cough, hemoptysis, shortness of breath and wheezing.   Cardiovascular: Negative for chest pain and leg swelling.  Gastrointestinal: Mild alternating diarrhea and constipation. Improving esophagitis. Negative for abdominal pain, nausea, and vomiting.  Genitourinary: Negative for bladder incontinence, difficulty urinating, dysuria, frequency and hematuria.   Musculoskeletal: Negative for back pain, gait problem, neck pain and neck stiffness.  Skin: Negative for itching and rash.  Neurological: Negative for dizziness, extremity weakness, gait problem, headaches, light-headedness and seizures.  Hematological: Negative for adenopathy. Does not bruise/bleed easily.  Psychiatric/Behavioral: Negative for confusion, depression and sleep disturbance. The patient is not nervous/anxious.     PHYSICAL EXAMINATION:  Blood pressure (!) 149/89, pulse 94, temperature (!) 97.5 F (36.4 C), temperature source Temporal, resp. rate 18, weight 152 lb 8 oz (69.2 kg), SpO2 96%.  ECOG PERFORMANCE STATUS: 1  Physical Exam  Constitutional: Oriented to person, place, and time and well-developed, well-nourished, and in no distress.  HENT:  Head: Normocephalic and atraumatic.  Mouth/Throat: Oropharynx is clear and moist. No oropharyngeal exudate.  Eyes: Conjunctivae are normal. Right eye exhibits no discharge. Left eye exhibits no  discharge. No scleral icterus.  Neck: Normal range of motion. Neck supple.  Cardiovascular: Normal rate, regular rhythm, normal heart sounds and intact distal pulses.   Pulmonary/Chest: Effort normal and breath  sounds normal. No respiratory distress. No wheezes. No rales.  Abdominal: Soft. Bowel sounds are normal. Exhibits no distension and no mass. There is no tenderness.  Musculoskeletal: Normal range of motion. Exhibits no edema.  Lymphadenopathy:    No cervical adenopathy.  Neurological: Alert and oriented to person, place, and time. Exhibits normal muscle tone. Gait normal. Coordination normal.  Skin: Skin is warm and dry. No rash noted. Not diaphoretic. No erythema. No pallor.  Psychiatric: Mood, memory and judgment normal.  Vitals reviewed.  LABORATORY DATA: Lab Results  Component Value Date   WBC 4.7 04/03/2023   HGB 13.0 04/03/2023   HCT 40.3 04/03/2023   MCV 89.4 04/03/2023   PLT 273 04/03/2023      Chemistry      Component Value Date/Time   NA 142 04/03/2023 1118   K 4.4 04/03/2023 1118   CL 110 04/03/2023 1118   CO2 27 04/03/2023 1118   BUN 12 04/03/2023 1118   CREATININE 1.05 (H) 04/03/2023 1118      Component Value Date/Time   CALCIUM 9.3 04/03/2023 1118   ALKPHOS 87 04/03/2023 1118   AST 17 04/03/2023 1118   ALT 16 04/03/2023 1118   BILITOT 0.4 04/03/2023 1118       RADIOGRAPHIC STUDIES:  CT Chest W Contrast Result Date: 04/06/2023 CLINICAL DATA:  Restaging non-small cell lung cancer. * Tracking Code: BO * EXAM: CT CHEST WITH CONTRAST TECHNIQUE: Multidetector CT imaging of the chest was performed during intravenous contrast administration. RADIATION DOSE REDUCTION: This exam was performed according to the departmental dose-optimization program which includes automated exposure control, adjustment of the mA and/or kV according to patient size and/or use of iterative reconstruction technique. CONTRAST:  75mL OMNIPAQUE IOHEXOL 300 MG/ML  SOLN COMPARISON:   11/07/2022. FINDINGS: Cardiovascular: Heart size is normal. Aortic atherosclerosis and multi vessel coronary artery calcifications. No pericardial effusion. Mediastinum/Nodes: No enlarged mediastinal, hilar, or axillary lymph nodes. Unchanged 7 mm short axis prevascular lymph node. Left hilar lymph node measures 0.8 cm, image 49/2. This is difficult to reliably compare with the previous exam which was performed without IV contrast material. Thyroid gland, trachea, and esophagus demonstrate no significant findings. Lungs/Pleura: No pleural effusion. No airspace consolidation. Emphysema. -The index nodule within the left upper lobe with cystic and solid components, which now contains a fiducial marker, has decreased in size compared with the previous study. This appears less solid measuring approximately 0.9 x 0.8 cm, image 52/7. Previously 2.2 x 1.1 cm. -Left lower lobe lung nodule measures 4 mm and is also unchanged in the interval, image 95/7. No new findings. Upper Abdomen: No acute abnormality. The adrenal glands are preserved. Musculoskeletal: No chest wall abnormality. No acute or significant osseous findings. Spondylosis within the thoracic spine. IMPRESSION: 1. Interval decrease in size of the index nodule within the left upper lobe with cystic and solid components, which now contains a fiducial marker. 2. Stable 4 mm left lower lobe lung nodule. 3. Stable subcentimeter short axis prevascular lymph node. 4. Coronary artery calcifications. 5. Aortic Atherosclerosis (ICD10-I70.0) and Emphysema (ICD10-J43.9). Electronically Signed   By: Signa Kell M.D.   On: 04/06/2023 10:23     ASSESSMENT/PLAN:  This is a 73 years old African-American female with Unresectable stage IIb (T1c, N1, M0) non-small cell lung cancer, squamous cell carcinoma diagnosed in October 2024 and presented with left upper lobe lung nodule in addition to left hilar lymphadenopathy.   She is not a good surgical candidate due to poor  pulmonary  function.   She underwent concurrent chemoradiation with weekly carboplatin for AUC of 2 and paclitaxel 45 Mg/M2 status post 6 cycles. Her last day of radiation was on 02/27/23.   The patient was seen with Dr. Arbutus Ped today.  Dr. Arbutus Ped personally and independently reviewed the scan and discussed results with the patient today.  The scan showed Referral decrease in the nodule in the left upper lobe with cystic and solid structures.  The subcentimeter prevascular lymph node is stable and there is a stable 4 mm left lower lobe nodule.  Dr. Arbutus Ped recommends she continue on observation.   She will continue on observation with a restaging CT scan in 3 months.   We will see her back 1 week after her CT scan to review the results.   The patient was advised to call immediately if she has any concerning symptoms in the interval. The patient voices understanding of current disease status and treatment options and is in agreement with the current care plan. All questions were answered. The patient knows to call the clinic with any problems, questions or concerns. We can certainly see the patient much sooner if necessary      Orders Placed This Encounter  Procedures   CT Chest W Contrast    Standing Status:   Future    Expected Date:   07/04/2023    Expiration Date:   04/05/2024    If indicated for the ordered procedure, I authorize the administration of contrast media per Radiology protocol:   Yes    Does the patient have a contrast media/X-ray dye allergy?:   No    Preferred imaging location?:   [facility]   CBC with Differential (Cancer Center Only)    Standing Status:   Future    Expected Date:   07/04/2023    Expiration Date:   04/05/2024   CMP (Cancer Center only)    Standing Status:   Future    Expected Date:   07/04/2023    Expiration Date:   04/05/2024     I spent {CHL ONC TIME VISIT - GUYQI:[id number]} counseling the patient face to face. The total time spent in the  appointment was {CHL ONC TIME VISIT - OVFIE:[id number]}.  Elene Downum L Derenda Giddings, PA-C 04/06/23

## 2023-04-06 ENCOUNTER — Encounter: Payer: Self-pay | Admitting: Internal Medicine

## 2023-04-06 ENCOUNTER — Inpatient Hospital Stay (HOSPITAL_BASED_OUTPATIENT_CLINIC_OR_DEPARTMENT_OTHER): Payer: Medicare Other | Admitting: Physician Assistant

## 2023-04-06 VITALS — BP 149/89 | HR 94 | Temp 97.5°F | Resp 18 | Wt 152.5 lb

## 2023-04-06 DIAGNOSIS — J439 Emphysema, unspecified: Secondary | ICD-10-CM | POA: Diagnosis not present

## 2023-04-06 DIAGNOSIS — R590 Localized enlarged lymph nodes: Secondary | ICD-10-CM | POA: Diagnosis not present

## 2023-04-06 DIAGNOSIS — Z7902 Long term (current) use of antithrombotics/antiplatelets: Secondary | ICD-10-CM | POA: Diagnosis not present

## 2023-04-06 DIAGNOSIS — C3412 Malignant neoplasm of upper lobe, left bronchus or lung: Secondary | ICD-10-CM | POA: Diagnosis not present

## 2023-04-06 DIAGNOSIS — Z79899 Other long term (current) drug therapy: Secondary | ICD-10-CM | POA: Diagnosis not present

## 2023-04-06 DIAGNOSIS — Z885 Allergy status to narcotic agent status: Secondary | ICD-10-CM | POA: Diagnosis not present

## 2023-04-06 DIAGNOSIS — Z881 Allergy status to other antibiotic agents status: Secondary | ICD-10-CM | POA: Diagnosis not present

## 2023-04-06 DIAGNOSIS — Z923 Personal history of irradiation: Secondary | ICD-10-CM | POA: Diagnosis not present

## 2023-04-06 DIAGNOSIS — Z9221 Personal history of antineoplastic chemotherapy: Secondary | ICD-10-CM | POA: Diagnosis not present

## 2023-04-06 DIAGNOSIS — R509 Fever, unspecified: Secondary | ICD-10-CM | POA: Diagnosis not present

## 2023-04-06 DIAGNOSIS — R0981 Nasal congestion: Secondary | ICD-10-CM | POA: Diagnosis not present

## 2023-04-06 DIAGNOSIS — K2100 Gastro-esophageal reflux disease with esophagitis, without bleeding: Secondary | ICD-10-CM | POA: Diagnosis not present

## 2023-04-06 DIAGNOSIS — Z883 Allergy status to other anti-infective agents status: Secondary | ICD-10-CM | POA: Diagnosis not present

## 2023-04-06 DIAGNOSIS — Z8719 Personal history of other diseases of the digestive system: Secondary | ICD-10-CM | POA: Diagnosis not present

## 2023-04-06 DIAGNOSIS — M47814 Spondylosis without myelopathy or radiculopathy, thoracic region: Secondary | ICD-10-CM | POA: Diagnosis not present

## 2023-04-06 DIAGNOSIS — Z86718 Personal history of other venous thrombosis and embolism: Secondary | ICD-10-CM | POA: Diagnosis not present

## 2023-04-06 DIAGNOSIS — Z860101 Personal history of adenomatous and serrated colon polyps: Secondary | ICD-10-CM | POA: Diagnosis not present

## 2023-04-06 DIAGNOSIS — K580 Irritable bowel syndrome with diarrhea: Secondary | ICD-10-CM | POA: Diagnosis not present

## 2023-04-06 DIAGNOSIS — I2510 Atherosclerotic heart disease of native coronary artery without angina pectoris: Secondary | ICD-10-CM | POA: Diagnosis not present

## 2023-04-06 DIAGNOSIS — I700 Atherosclerosis of aorta: Secondary | ICD-10-CM | POA: Diagnosis not present

## 2023-04-07 ENCOUNTER — Encounter: Payer: Self-pay | Admitting: Internal Medicine

## 2023-04-07 DIAGNOSIS — E119 Type 2 diabetes mellitus without complications: Secondary | ICD-10-CM | POA: Diagnosis not present

## 2023-04-12 ENCOUNTER — Encounter: Payer: Self-pay | Admitting: Radiation Oncology

## 2023-04-12 NOTE — Progress Notes (Signed)
  Radiation Oncology         [phone] ________________________________  Name: Vicki Page MRN: [id number]  Date: 04/13/2023  DOB: 11-Feb-1950  End of Treatment Note  Diagnosis:  Stage IIB, cT1cN1M0, NSCLC, squamous cell carcinoma of the LUL      Indication for treatment:  curative        Radiation treatment dates:   First Treatment Date: 2023-01-19 Last Treatment Date: 2023-03-03  Site/Dose/Technique/Mode:  Plan Name: Lung_L Site: Bronchus, Left Technique: 3D Mode: Photon Dose Per Fraction: 2 Gy Prescribed Dose (Delivered / Prescribed): 60 Gy / 60 Gy Prescribed Fxs (Delivered / Prescribed): 30 / 30  Narrative: The patient tolerated radiation treatment relatively well.  Patient did endorse developing fatigue along with mild skin irritations. She denied experiencing any symptoms of esophagitis. Otherwise, she tolerated treatment quite well with no other significant complications.   Plan: The patient has completed radiation treatment. The patient will return to radiation oncology clinic for routine followup in one month. I advised them to call or return sooner if they have any questions or concerns related to their recovery or treatment.  -----------------------------------  Billie Lade, PhD, MD  This document serves as a record of services personally performed by Antony Blackbird, MD. It was created on his behalf by Herbie Saxon, a trained medical scribe. The creation of this record is based on the scribe's personal observations and the provider's statements to them. This document has been checked and approved by the attending provider.

## 2023-04-12 NOTE — Progress Notes (Signed)
 Radiation Oncology         [phone] ________________________________  Name: Vicki Page MRN: [id number]  Date: 04/13/2023  DOB: Jul 17, 1950  Follow-Up Visit Note  CC: Aliene Beams, MD  Si Gaul, MD  No diagnosis found.  Diagnosis:  Stage IIB, cT1cN1M0, NSCLC, squamous cell carcinoma of the LUL   Indication for treatment:  curative     Interval Since Last Radiation:  *** {days/weeks/months:[id number]}  Radiation treatment dates:   First Treatment Date: 2023-01-19 Last Treatment Date: 2023-03-03   Site/Dose/Technique/Mode:  Plan Name: Lung_L Site: Bronchus, Left Technique: 3D Mode: Photon Dose Per Fraction: 2 Gy Prescribed Dose (Delivered / Prescribed): 60 Gy / 60 Gy Prescribed Fxs (Delivered / Prescribed): 30 / 30  Narrative:  The patient returns today for routine follow-up. She was last seen in office on 01-09-23 for a follow up new visit.                               Since then, patient completed her radiation treatment which she tolerated quite well.  Patient did endorse developing fatigue along with mild skin irritations. She denied experiencing any symptoms of esophagitis. Otherwise, she tolerated treatment quite well with no other significant complications.   She continued to follow up with her oncologist with complains of chronic fatigued, weakness, and occasional dysphagia. During most recent follow up with PA Cassandra Heilingoetter on 04-06-23, she continued to complain of fatigue and taste alternations which she attributes as radiation treatment side effects.   She underwent a CT chest on 04-03-23 which showed an interval decrease in size of the index nodule within the left upper lobe with cystic and solid components now measuring 0.9 x 0.8 cm compared to 2.2 x 1.1 cm previously. Scan also indicated a stable 4 mm left lower lobe lung nodule and a stable subcentimeter short axis prevascular lymph node.  No other significant oncologic interval history since  the patient was last seen.    Allergies:  is allergic to fluconazole, ropinirole, codeine, darvon [propoxyphene hcl], and flagyl [metronidazole].  Meds: Current Outpatient Medications  Medication Sig Dispense Refill   acetaminophen (TYLENOL) 500 MG tablet Take 1,000 mg by mouth 2 (two) times daily as needed for pain.      amLODipine (NORVASC) 10 MG tablet Take 10 mg by mouth daily. for high blood pressure     aspirin EC 81 MG tablet Take 81 mg by mouth daily.     atorvastatin (LIPITOR) 80 MG tablet Take 80 mg by mouth daily.     buPROPion (WELLBUTRIN SR) 150 MG 12 hr tablet Take 150 mg by mouth 2 (two) times daily.     Calcium-Vitamin D (CALTRATE 600 PLUS-VIT D PO) Take 1 tablet by mouth daily before supper.      Cholecalciferol (VITAMIN D3) 10 MCG (400 UNIT) CAPS Take 400 Units by mouth daily.     clopidogrel (PLAVIX) 75 MG tablet Take 1 tablet (75 mg total) by mouth daily with breakfast. Okay to restart on 11/22/2022.     LORazepam (ATIVAN) 0.5 MG tablet 1 tablet p.o. 30 minutes before MRI.  Repeat x 1 if needed 2 tablet 0   losartan (COZAAR) 50 MG tablet Take 75 mg by mouth daily.     Magnesium 200 MG TABS Take 200 mg by mouth daily.     Multiple Vitamin (MULTIVITAMIN WITH MINERALS) TABS tablet Take 1 tablet by mouth daily before supper. Centrum Silver  ondansetron (ZOFRAN) 8 MG tablet Take 8 mg by mouth every 8 (eight) hours as needed.     pantoprazole (PROTONIX) 40 MG tablet Take 1 tablet (40 mg total) by mouth daily. 30 tablet 2   Probiotic Product (PROBIOTIC PO) Take 1 tablet by mouth daily after lunch.      prochlorperazine (COMPAZINE) 10 MG tablet Take 1 tablet (10 mg total) by mouth every 6 (six) hours as needed for nausea or vomiting. 30 tablet 1   Psyllium (METAMUCIL) 28.3 % POWD Take 1 Dose by mouth daily. 2 teaspoons full = 1 dose     Tetrahydrozoline HCl (VISINE OP) Place 1 drop into both eyes daily as needed (irritation/dry eyes).     vitamin B-12 (CYANOCOBALAMIN) 1000  MCG tablet Take 1,000 mcg by mouth daily.     vitamin C (ASCORBIC ACID) 500 MG tablet Take 500 mg by mouth daily.     No current facility-administered medications for this encounter.    Physical Findings: The patient is in no acute distress. Patient is alert and oriented.  vitals were not taken for this visit. .  No significant changes. Lungs are clear to auscultation bilaterally. Heart has regular rate and rhythm. No palpable cervical, supraclavicular, or axillary adenopathy. Abdomen soft, non-tender, normal bowel sounds.   Lab Findings: Lab Results  Component Value Date   WBC 4.7 04/03/2023   HGB 13.0 04/03/2023   HCT 40.3 04/03/2023   MCV 89.4 04/03/2023   PLT 273 04/03/2023    Radiographic Findings: CT Chest W Contrast Result Date: 04/06/2023 CLINICAL DATA:  Restaging non-small cell lung cancer. * Tracking Code: BO * EXAM: CT CHEST WITH CONTRAST TECHNIQUE: Multidetector CT imaging of the chest was performed during intravenous contrast administration. RADIATION DOSE REDUCTION: This exam was performed according to the departmental dose-optimization program which includes automated exposure control, adjustment of the mA and/or kV according to patient size and/or use of iterative reconstruction technique. CONTRAST:  75mL OMNIPAQUE IOHEXOL 300 MG/ML  SOLN COMPARISON:  11/07/2022. FINDINGS: Cardiovascular: Heart size is normal. Aortic atherosclerosis and multi vessel coronary artery calcifications. No pericardial effusion. Mediastinum/Nodes: No enlarged mediastinal, hilar, or axillary lymph nodes. Unchanged 7 mm short axis prevascular lymph node. Left hilar lymph node measures 0.8 cm, image 49/2. This is difficult to reliably compare with the previous exam which was performed without IV contrast material. Thyroid gland, trachea, and esophagus demonstrate no significant findings. Lungs/Pleura: No pleural effusion. No airspace consolidation. Emphysema. -The index nodule within the left upper lobe  with cystic and solid components, which now contains a fiducial marker, has decreased in size compared with the previous study. This appears less solid measuring approximately 0.9 x 0.8 cm, image 52/7. Previously 2.2 x 1.1 cm. -Left lower lobe lung nodule measures 4 mm and is also unchanged in the interval, image 95/7. No new findings. Upper Abdomen: No acute abnormality. The adrenal glands are preserved. Musculoskeletal: No chest wall abnormality. No acute or significant osseous findings. Spondylosis within the thoracic spine. IMPRESSION: 1. Interval decrease in size of the index nodule within the left upper lobe with cystic and solid components, which now contains a fiducial marker. 2. Stable 4 mm left lower lobe lung nodule. 3. Stable subcentimeter short axis prevascular lymph node. 4. Coronary artery calcifications. 5. Aortic Atherosclerosis (ICD10-I70.0) and Emphysema (ICD10-J43.9). Electronically Signed   By: Signa Kell M.D.   On: 04/06/2023 10:23    Impression:  Stage IIB, cT1cN1M0, NSCLC, squamous cell carcinoma of the LUL   The patient  is recovering from the effects of radiation.  ***  Plan:  ***   *** minutes of total time was spent for this patient encounter, including preparation, face-to-face counseling with the patient and coordination of care, physical exam, and documentation of the encounter. ____________________________________  Billie Lade, PhD, MD  This document serves as a record of services personally performed by Antony Blackbird, MD. It was created on his behalf by Herbie Saxon, a trained medical scribe. The creation of this record is based on the scribe's personal observations and the provider's statements to them. This document has been checked and approved by the attending provider.

## 2023-04-13 ENCOUNTER — Ambulatory Visit
Admission: RE | Admit: 2023-04-13 | Discharge: 2023-04-13 | Disposition: A | Discharge status: Home / Self Care | Payer: Self-pay | Source: Ambulatory Visit | Attending: Radiation Oncology | Admitting: Radiation Oncology

## 2023-04-13 ENCOUNTER — Encounter: Payer: Self-pay | Admitting: Radiation Oncology

## 2023-04-13 VITALS — BP 126/50 | HR 90 | Temp 97.8°F | Resp 20 | Ht 61.6 in | Wt 146.0 lb

## 2023-04-13 DIAGNOSIS — C3412 Malignant neoplasm of upper lobe, left bronchus or lung: Secondary | ICD-10-CM | POA: Diagnosis not present

## 2023-04-13 DIAGNOSIS — Z923 Personal history of irradiation: Secondary | ICD-10-CM | POA: Diagnosis not present

## 2023-04-13 DIAGNOSIS — B370 Candidal stomatitis: Secondary | ICD-10-CM

## 2023-04-13 HISTORY — DX: Personal history of irradiation: Z92.3

## 2023-04-13 MED ORDER — NYSTATIN 100000 UNIT/ML MT SUSP: 5.0000 mL | Freq: Four times a day (QID) | OROMUCOSAL | 0 refills | Status: AC

## 2023-04-13 NOTE — Progress Notes (Signed)
 Vicki Page is here today for follow up post radiation to the lung.  Lung Side: Left  Does the patient complain of any of the following: Pain:  Shortness of breath w/wo exertion: Denies Cough: Yes, intermittently. Hemoptysis: Denies Pain with swallowing: Yes, she reports that her tongue feels swallowing. Swallowing/choking concerns: Yes Appetite: Fair Energy Level: Fair Post radiation skin Changes: Denies    Additional comments if applicable:She reports having post radiation fatigue.  BP (!) 126/50 (BP Location: Left Arm, Patient Position: Sitting, Cuff Size: Normal)   Pulse 90   Temp 97.8 F (36.6 C)   Resp 20   Ht 5' 1.6" (1.565 m)   Wt 146 lb (66.2 kg)   LMP  (LMP Unknown)   SpO2 96%   BMI 27.05 kg/m

## 2023-06-23 DIAGNOSIS — E7800 Pure hypercholesterolemia, unspecified: Secondary | ICD-10-CM | POA: Diagnosis not present

## 2023-06-23 DIAGNOSIS — I739 Peripheral vascular disease, unspecified: Secondary | ICD-10-CM | POA: Diagnosis not present

## 2023-06-23 DIAGNOSIS — E118 Type 2 diabetes mellitus with unspecified complications: Secondary | ICD-10-CM | POA: Diagnosis not present

## 2023-06-23 DIAGNOSIS — F17200 Nicotine dependence, unspecified, uncomplicated: Secondary | ICD-10-CM | POA: Diagnosis not present

## 2023-06-23 DIAGNOSIS — Z79899 Other long term (current) drug therapy: Secondary | ICD-10-CM | POA: Diagnosis not present

## 2023-06-23 DIAGNOSIS — I10 Essential (primary) hypertension: Secondary | ICD-10-CM | POA: Diagnosis not present

## 2023-06-28 ENCOUNTER — Inpatient Hospital Stay: Payer: Medicare Other | Attending: Physician Assistant

## 2023-06-28 ENCOUNTER — Ambulatory Visit (HOSPITAL_COMMUNITY)
Admission: RE | Admit: 2023-06-28 | Discharge: 2023-06-28 | Disposition: A | Discharge status: Home / Self Care | Source: Ambulatory Visit | Attending: Physician Assistant | Admitting: Physician Assistant

## 2023-06-28 DIAGNOSIS — Z923 Personal history of irradiation: Secondary | ICD-10-CM | POA: Diagnosis not present

## 2023-06-28 DIAGNOSIS — Z885 Allergy status to narcotic agent status: Secondary | ICD-10-CM | POA: Diagnosis not present

## 2023-06-28 DIAGNOSIS — J701 Chronic and other pulmonary manifestations due to radiation: Secondary | ICD-10-CM | POA: Diagnosis not present

## 2023-06-28 DIAGNOSIS — C3490 Malignant neoplasm of unspecified part of unspecified bronchus or lung: Secondary | ICD-10-CM | POA: Diagnosis not present

## 2023-06-28 DIAGNOSIS — Z8719 Personal history of other diseases of the digestive system: Secondary | ICD-10-CM | POA: Diagnosis not present

## 2023-06-28 DIAGNOSIS — Z79899 Other long term (current) drug therapy: Secondary | ICD-10-CM | POA: Insufficient documentation

## 2023-06-28 DIAGNOSIS — Z883 Allergy status to other anti-infective agents status: Secondary | ICD-10-CM | POA: Diagnosis not present

## 2023-06-28 DIAGNOSIS — M25511 Pain in right shoulder: Secondary | ICD-10-CM | POA: Insufficient documentation

## 2023-06-28 DIAGNOSIS — M79601 Pain in right arm: Secondary | ICD-10-CM | POA: Diagnosis not present

## 2023-06-28 DIAGNOSIS — I2510 Atherosclerotic heart disease of native coronary artery without angina pectoris: Secondary | ICD-10-CM | POA: Diagnosis not present

## 2023-06-28 DIAGNOSIS — Z7982 Long term (current) use of aspirin: Secondary | ICD-10-CM | POA: Insufficient documentation

## 2023-06-28 DIAGNOSIS — Z7902 Long term (current) use of antithrombotics/antiplatelets: Secondary | ICD-10-CM | POA: Insufficient documentation

## 2023-06-28 DIAGNOSIS — Z881 Allergy status to other antibiotic agents status: Secondary | ICD-10-CM | POA: Diagnosis not present

## 2023-06-28 DIAGNOSIS — C3412 Malignant neoplasm of upper lobe, left bronchus or lung: Secondary | ICD-10-CM | POA: Insufficient documentation

## 2023-06-28 DIAGNOSIS — J9811 Atelectasis: Secondary | ICD-10-CM | POA: Diagnosis not present

## 2023-06-28 DIAGNOSIS — Z86718 Personal history of other venous thrombosis and embolism: Secondary | ICD-10-CM | POA: Diagnosis not present

## 2023-06-28 DIAGNOSIS — J439 Emphysema, unspecified: Secondary | ICD-10-CM | POA: Diagnosis not present

## 2023-06-28 DIAGNOSIS — I700 Atherosclerosis of aorta: Secondary | ICD-10-CM | POA: Diagnosis not present

## 2023-06-28 DIAGNOSIS — R590 Localized enlarged lymph nodes: Secondary | ICD-10-CM | POA: Diagnosis not present

## 2023-06-28 DIAGNOSIS — Z9851 Tubal ligation status: Secondary | ICD-10-CM | POA: Insufficient documentation

## 2023-06-28 DIAGNOSIS — Z860101 Personal history of adenomatous and serrated colon polyps: Secondary | ICD-10-CM | POA: Diagnosis not present

## 2023-06-28 DIAGNOSIS — I513 Intracardiac thrombosis, not elsewhere classified: Secondary | ICD-10-CM | POA: Insufficient documentation

## 2023-06-28 LAB — CBC WITH DIFFERENTIAL (CANCER CENTER ONLY)
Abs Immature Granulocytes: 0.02 10*3/uL (ref 0.00–0.07)
Basophils Absolute: 0 10*3/uL (ref 0.0–0.1)
Basophils Relative: 0 %
Eosinophils Absolute: 0 10*3/uL (ref 0.0–0.5)
Eosinophils Relative: 0 %
HCT: 42.4 % (ref 36.0–46.0)
Hemoglobin: 13.9 g/dL (ref 12.0–15.0)
Immature Granulocytes: 0 %
Lymphocytes Relative: 15 %
Lymphs Abs: 0.7 10*3/uL (ref 0.7–4.0)
MCH: 27.9 pg (ref 26.0–34.0)
MCHC: 32.8 g/dL (ref 30.0–36.0)
MCV: 85 fL (ref 80.0–100.0)
Monocytes Absolute: 0.4 10*3/uL (ref 0.1–1.0)
Monocytes Relative: 9 %
Neutro Abs: 3.4 10*3/uL (ref 1.7–7.7)
Neutrophils Relative %: 76 %
Platelet Count: 263 10*3/uL (ref 150–400)
RBC: 4.99 MIL/uL (ref 3.87–5.11)
RDW: 15.8 % — ABNORMAL HIGH (ref 11.5–15.5)
WBC Count: 4.5 10*3/uL (ref 4.0–10.5)
nRBC: 0 % (ref 0.0–0.2)

## 2023-06-28 LAB — CMP (CANCER CENTER ONLY)
ALT: 14 U/L (ref 0–44)
AST: 15 U/L (ref 15–41)
Albumin: 3.8 g/dL (ref 3.5–5.0)
Alkaline Phosphatase: 103 U/L (ref 38–126)
Anion gap: 5 (ref 5–15)
BUN: 15 mg/dL (ref 8–23)
CO2: 25 mmol/L (ref 22–32)
Calcium: 9 mg/dL (ref 8.9–10.3)
Chloride: 109 mmol/L (ref 98–111)
Creatinine: 1.06 mg/dL — ABNORMAL HIGH (ref 0.44–1.00)
GFR, Estimated: 55 mL/min — ABNORMAL LOW (ref >60)
Glucose, Bld: 105 mg/dL — ABNORMAL HIGH (ref 70–99)
Potassium: 4 mmol/L (ref 3.5–5.1)
Sodium: 139 mmol/L (ref 135–145)
Total Bilirubin: 0.4 mg/dL (ref 0.0–1.2)
Total Protein: 7.1 g/dL (ref 6.5–8.1)

## 2023-06-28 MED ORDER — SODIUM CHLORIDE (PF) 0.9 % IJ SOLN
INTRAMUSCULAR | Status: AC
  Filled 2023-06-28: qty 50

## 2023-06-28 MED ORDER — IOHEXOL 300 MG/ML  SOLN
75.0000 mL | Freq: Once | INTRAMUSCULAR | Status: AC | PRN
  Administered 2023-06-28: 75 mL via INTRAVENOUS

## 2023-07-05 ENCOUNTER — Inpatient Hospital Stay (HOSPITAL_BASED_OUTPATIENT_CLINIC_OR_DEPARTMENT_OTHER): Payer: Medicare Other | Admitting: Internal Medicine

## 2023-07-05 VITALS — BP 120/54 | HR 66 | Temp 98.1°F | Resp 17 | Ht 61.6 in | Wt 145.1 lb

## 2023-07-05 DIAGNOSIS — Z860101 Personal history of adenomatous and serrated colon polyps: Secondary | ICD-10-CM | POA: Diagnosis not present

## 2023-07-05 DIAGNOSIS — Z923 Personal history of irradiation: Secondary | ICD-10-CM | POA: Diagnosis not present

## 2023-07-05 DIAGNOSIS — J701 Chronic and other pulmonary manifestations due to radiation: Secondary | ICD-10-CM | POA: Diagnosis not present

## 2023-07-05 DIAGNOSIS — Z8719 Personal history of other diseases of the digestive system: Secondary | ICD-10-CM | POA: Diagnosis not present

## 2023-07-05 DIAGNOSIS — I2510 Atherosclerotic heart disease of native coronary artery without angina pectoris: Secondary | ICD-10-CM | POA: Diagnosis not present

## 2023-07-05 DIAGNOSIS — C3490 Malignant neoplasm of unspecified part of unspecified bronchus or lung: Secondary | ICD-10-CM

## 2023-07-05 DIAGNOSIS — Z7902 Long term (current) use of antithrombotics/antiplatelets: Secondary | ICD-10-CM | POA: Diagnosis not present

## 2023-07-05 DIAGNOSIS — Z883 Allergy status to other anti-infective agents status: Secondary | ICD-10-CM | POA: Diagnosis not present

## 2023-07-05 DIAGNOSIS — Z881 Allergy status to other antibiotic agents status: Secondary | ICD-10-CM | POA: Diagnosis not present

## 2023-07-05 DIAGNOSIS — I700 Atherosclerosis of aorta: Secondary | ICD-10-CM | POA: Diagnosis not present

## 2023-07-05 DIAGNOSIS — Z885 Allergy status to narcotic agent status: Secondary | ICD-10-CM | POA: Diagnosis not present

## 2023-07-05 DIAGNOSIS — M25511 Pain in right shoulder: Secondary | ICD-10-CM | POA: Diagnosis not present

## 2023-07-05 DIAGNOSIS — C3412 Malignant neoplasm of upper lobe, left bronchus or lung: Secondary | ICD-10-CM | POA: Diagnosis not present

## 2023-07-05 DIAGNOSIS — I513 Intracardiac thrombosis, not elsewhere classified: Secondary | ICD-10-CM | POA: Diagnosis not present

## 2023-07-05 DIAGNOSIS — M79601 Pain in right arm: Secondary | ICD-10-CM | POA: Diagnosis not present

## 2023-07-05 DIAGNOSIS — Z79899 Other long term (current) drug therapy: Secondary | ICD-10-CM | POA: Diagnosis not present

## 2023-07-05 DIAGNOSIS — J439 Emphysema, unspecified: Secondary | ICD-10-CM | POA: Diagnosis not present

## 2023-07-05 DIAGNOSIS — Z86718 Personal history of other venous thrombosis and embolism: Secondary | ICD-10-CM | POA: Diagnosis not present

## 2023-07-05 DIAGNOSIS — Z7982 Long term (current) use of aspirin: Secondary | ICD-10-CM | POA: Diagnosis not present

## 2023-07-05 DIAGNOSIS — R590 Localized enlarged lymph nodes: Secondary | ICD-10-CM | POA: Diagnosis not present

## 2023-07-05 NOTE — Progress Notes (Signed)
 [facility] [phone]   [phone]  OFFICE PROGRESS NOTE  Dorena Gander, MD [phone] [place] [address]  DIAGNOSIS: Unresectable stage IIb (T1c, N1, M0) non-small cell lung cancer, squamous cell carcinoma diagnosed in October 2024 and presented with left upper lobe lung nodule in addition to left hilar lymphadenopathy.  The patient not a good surgical candidate because of poor pulmonary function.  PRIOR THERAPY: A course of concurrent chemoradiation with weekly carboplatin  for AUC of 2 and paclitaxel  45 Mg/M2.  Status post 6 cycles.  Last dose was given February 27, 2023.  CURRENT THERAPY: Observation.  INTERVAL HISTORY: Vicki Page 73 y.o. female returns to the clinic today for follow-up visit. Discussed the use of AI scribe software for clinical note transcription with the patient, who gave verbal consent to proceed.  History of Present Illness   Vicki Page is a 73 year old female with unresectable stage 2B non-small cell lung cancer who presents for follow-up.  She has a history of unresectable stage 2B (T1c, N1, M0) non-small cell lung cancer, squamous cell carcinoma, diagnosed in October 2024. Due to poor pulmonary function, she was not a surgical candidate and underwent concurrent chemoradiation with weekly carboplatin , completed in January 2025. She has been on observation since then.  She experiences persistent stomach discomfort and bowel issues since her last visit three months ago. She has been using Metamucil, and recently switched to Benefiber, but notes no significant improvement. She has a gastroenterologist but cannot recall the name or group affiliation. Her last colonoscopy was performed in late 2024, and an upper endoscopy was done in 2019.  Regarding her respiratory status, her breathing is manageable as long as she avoids long walks, particularly when visiting her daughter in the hospital. No  coughing or hemoptysis. She notes difficulty with long walks, which she could manage until last year, and now requires frequent rests.  She reports right shoulder and arm pain persisting throughout the year, affecting daily activities such as washing her hair and taking a shower. She is right-handed and finds the pain particularly challenging. She has not yet sought orthopedic evaluation for this issue.       MEDICAL HISTORY: Past Medical History:  Diagnosis Date   Adenomatous colon polyp    Aneurysm (HCC)    brain x 2   Anxiety    Arthritis    Atherosclerosis    Barrett's esophagus    Carotid artery disease (HCC)    Cataracts, bilateral    just watching   DDD (degenerative disc disease), lumbar    Depression    Diabetes mellitus without complication (HCC)    Diverticulosis    DVT (deep venous thrombosis) (HCC)    left leg   Fall at home 05/30/2014   Pt slipped in tub- hurt left hip and right shoulder   GERD (gastroesophageal reflux disease)    H. pylori infection    History of radiation therapy    Left lung-01/19/23-03/03/23- Dr. Retta Caster   Hyperlipidemia    Hypertension    Hypothyroidism    IBS (irritable bowel syndrome)    Osteoarthritis    Poor sleep 04/28/2020   Pre-diabetes    no meds, diet controlled   RLS (restless legs syndrome)    Tubular adenoma of colon    Ulcer of the stomach and intestine     ALLERGIES:  is allergic to fluconazole , ropinirole, codeine, darvon [propoxyphene hcl], and flagyl  [  metronidazole ].  MEDICATIONS:  Current Outpatient Medications  Medication Sig Dispense Refill   acetaminophen  (TYLENOL ) 500 MG tablet Take 1,000 mg by mouth 2 (two) times daily as needed for pain.      amLODipine (NORVASC) 10 MG tablet Take 10 mg by mouth daily. for high blood pressure     aspirin  EC 81 MG tablet Take 81 mg by mouth daily.     atorvastatin (LIPITOR) 80 MG tablet Take 80 mg by mouth daily.     buPROPion (WELLBUTRIN SR) 150 MG 12 hr tablet Take  150 mg by mouth 2 (two) times daily.     Calcium-Vitamin D (CALTRATE 600 PLUS-VIT D PO) Take 1 tablet by mouth daily before supper.      Cholecalciferol (VITAMIN D3) 10 MCG (400 UNIT) CAPS Take 400 Units by mouth daily.     clopidogrel  (PLAVIX ) 75 MG tablet Take 1 tablet (75 mg total) by mouth daily with breakfast. Okay to restart on 11/22/2022.     LORazepam  (ATIVAN ) 0.5 MG tablet 1 tablet p.o. 30 minutes before MRI.  Repeat x 1 if needed 2 tablet 0   losartan (COZAAR) 50 MG tablet Take 75 mg by mouth daily.     Magnesium 200 MG TABS Take 200 mg by mouth daily.     Multiple Vitamin (MULTIVITAMIN WITH MINERALS) TABS tablet Take 1 tablet by mouth daily before supper. Centrum Silver     ondansetron  (ZOFRAN ) 8 MG tablet Take 8 mg by mouth every 8 (eight) hours as needed.     pantoprazole  (PROTONIX ) 40 MG tablet Take 1 tablet (40 mg total) by mouth daily. 30 tablet 2   Probiotic Product (PROBIOTIC PO) Take 1 tablet by mouth daily after lunch.      prochlorperazine  (COMPAZINE ) 10 MG tablet Take 1 tablet (10 mg total) by mouth every 6 (six) hours as needed for nausea or vomiting. 30 tablet 1   Psyllium (METAMUCIL) 28.3 % POWD Take 1 Dose by mouth daily. 2 teaspoons full = 1 dose     Tetrahydrozoline HCl (VISINE OP) Place 1 drop into both eyes daily as needed (irritation/dry eyes).     vitamin B-12 (CYANOCOBALAMIN ) 1000 MCG tablet Take 1,000 mcg by mouth daily.     vitamin C (ASCORBIC ACID) 500 MG tablet Take 500 mg by mouth daily.     No current facility-administered medications for this visit.    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   BRAIN SURGERY     Per patient " Left side behind eyes   BRONCHIAL BIOPSY  11/21/2022    Procedure: BRONCHIAL BIOPSIES;  Surgeon: Denson Flake, MD;  Location: [facility] ENDOSCOPY;  Service: Pulmonary;;   BRONCHIAL BRUSHINGS  11/21/2022   Procedure: BRONCHIAL BRUSHINGS;  Surgeon: Denson Flake, MD;  Location: [facility] ENDOSCOPY;  Service: Pulmonary;;   BRONCHIAL NEEDLE ASPIRATION BIOPSY  11/21/2022   Procedure: BRONCHIAL NEEDLE ASPIRATION BIOPSIES;  Surgeon: Denson Flake, MD;  Location: [facility] ENDOSCOPY;  Service: Pulmonary;;   BRONCHIAL WASHINGS  11/21/2022   Procedure: BRONCHIAL WASHINGS;  Surgeon: Denson Flake, MD;  Location: [facility] ENDOSCOPY;  Service: Pulmonary;;   FIDUCIAL MARKER PLACEMENT  11/21/2022   Procedure: FIDUCIAL MARKER PLACEMENT;  Surgeon: Denson Flake, MD;  Location: [facility]  ENDOSCOPY;  Service: Pulmonary;;   INSERTION OF ILIAC STENT  10/04/2012   Procedure: INSERTION OF ILIAC STENT;  Surgeon: Lucendia Rusk, MD;  Location: [facility] CATH LAB;  Service: Cardiovascular;;  Left External Iliac Artery   IR ANGIO INTRA EXTRACRAN SEL COM CAROTID INNOMINATE BILAT MOD SED  11/23/2018   IR ANGIO INTRA EXTRACRAN SEL COM CAROTID INNOMINATE BILAT MOD SED  12/03/2020   IR ANGIO VERTEBRAL SEL SUBCLAVIAN INNOMINATE UNI L MOD SED  11/23/2018   IR ANGIO VERTEBRAL SEL VERTEBRAL BILAT MOD SED  12/03/2020   IR ANGIO VERTEBRAL SEL VERTEBRAL UNI L MOD SED  12/10/2018   IR ANGIO VERTEBRAL SEL VERTEBRAL UNI R MOD SED  11/23/2018   IR GENERIC HISTORICAL  11/03/2015   IR ANGIO VERTEBRAL SEL VERTEBRAL UNI L MOD SED 11/03/2015 Luellen Sages, MD [facility]   IR GENERIC HISTORICAL  11/03/2015   IR ANGIO INTRA EXTRACRAN SEL COM CAROTID INNOMINATE BILAT MOD SED 11/03/2015 Luellen Sages, MD [facility]   IR GENERIC HISTORICAL  11/03/2015   IR ANGIO VERTEBRAL SEL SUBCLAVIAN INNOMINATE UNI R MOD SED 11/03/2015 Luellen Sages, MD [facility]   IR GENERIC HISTORICAL  11/19/2015   IR RADIOLOGIST EVAL & MGMT 11/19/2015 [facility]   IR RADIOLOGIST EVAL & MGMT  02/15/2022   IR US  GUIDE VASC ACCESS  RIGHT  11/23/2018   IR US  GUIDE VASC ACCESS RIGHT  12/03/2020   LEG SURGERY     LOWER EXTREMITY ANGIOGRAM Left 10/04/2012   and Left iliac stent   LOWER EXTREMITY ANGIOGRAM N/A 10/04/2012   Procedure: LOWER EXTREMITY ANGIOGRAM;  Surgeon: Lucendia Rusk, MD;  Location: [facility] CATH LAB;  Service: Cardiovascular;  Laterality: N/A;   RADIOLOGY WITH ANESTHESIA N/A 12/10/2018   Procedure: STENTING;  Surgeon: Luellen Sages, MD;  Location: [facility] OR;  Service: Radiology;  Laterality: N/A;   TUBAL LIGATION      REVIEW OF SYSTEMS:  Constitutional: negative Eyes: negative Ears, nose, mouth, throat, and face: negative Respiratory: positive for dyspnea on exertion Cardiovascular: negative Gastrointestinal: positive for diarrhea Genitourinary:negative Integument/breast: negative Hematologic/lymphatic: negative Musculoskeletal:positive for arthralgias Neurological: negative Behavioral/Psych: negative Endocrine: negative Allergic/Immunologic: negative   PHYSICAL EXAMINATION: General appearance: alert, cooperative, fatigued, and no distress Head: Normocephalic, without obvious abnormality, atraumatic Neck: no adenopathy, no JVD, supple, symmetrical, trachea midline, and thyroid  not enlarged, symmetric, no tenderness/mass/nodules Lymph nodes: Cervical, supraclavicular, and axillary nodes normal. Resp: clear to auscultation bilaterally Back: symmetric, no curvature. ROM normal. No CVA tenderness. Cardio: regular rate and rhythm, S1, S2 normal, no murmur, click, rub or gallop GI: soft, non-tender; bowel sounds normal; no masses,  no organomegaly Extremities: extremities normal, atraumatic, no cyanosis or edema Neurologic: Alert and oriented X 3, normal strength and tone. Normal symmetric reflexes. Normal coordination and gait  ECOG PERFORMANCE STATUS: 1 - Symptomatic but completely ambulatory  Blood pressure (!) 120/54, pulse 66, temperature 98.1 F (36.7 C), resp. rate 17, height 5' 1.6"  (1.565 m), weight 145 lb 1.6 oz (65.8 kg), SpO2 94%.  LABORATORY DATA: Lab Results  Component Value Date   WBC 4.5 06/28/2023   HGB 13.9 06/28/2023   HCT 42.4 06/28/2023   MCV 85.0 06/28/2023   PLT 263 06/28/2023      Chemistry      Component Value Date/Time   NA 139 06/28/2023 1419   K 4.0 06/28/2023 1419   CL 109 06/28/2023 1419   CO2 25 06/28/2023 1419   BUN 15 06/28/2023 1419   CREATININE 1.06 (H) 06/28/2023 1419  Component Value Date/Time   CALCIUM 9.0 06/28/2023 1419   ALKPHOS 103 06/28/2023 1419   AST 15 06/28/2023 1419   ALT 14 06/28/2023 1419   BILITOT 0.4 06/28/2023 1419       RADIOGRAPHIC STUDIES: CT Chest W Contrast Result Date: 06/29/2023 CLINICAL DATA:  Non-small cell lung cancer (NSCLC), non-metastatic, assess treatment response. * Tracking Code: BO * EXAM: CT CHEST WITH CONTRAST TECHNIQUE: Multidetector CT imaging of the chest was performed during intravenous contrast administration. RADIATION DOSE REDUCTION: This exam was performed according to the departmental dose-optimization program which includes automated exposure control, adjustment of the mA and/or kV according to patient size and/or use of iterative reconstruction technique. CONTRAST:  75mL OMNIPAQUE  IOHEXOL  300 MG/ML  SOLN COMPARISON:  CT scan chest from 04/03/2023. FINDINGS: Cardiovascular: Normal cardiac size. No pericardial effusion. No aortic aneurysm. There are coronary artery calcifications, in keeping with coronary artery disease. There are also moderate peripheral atherosclerotic vascular calcifications of thoracic aorta and its major branches. Note is again made of focal asymmetric intramural thrombus in the middle descending thoracic aorta extending from 12 to 7 o'clock position (series 2, image 57), exhibiting small plaque ulcerations. No significant interval change. There is dilation of the main pulmonary trunk measuring up to 3.7 cm, which is nonspecific but can be seen with pulmonary  artery hypertension. Mediastinum/Nodes: Visualized thyroid  gland appears grossly unremarkable. No solid / cystic mediastinal masses. The esophagus is nondistended precluding optimal assessment. There are few mildly prominent mediastinal and hilar lymph nodes, which do not meet the size criteria for lymphadenopathy and appear grossly similar to the prior study. No axillary lymphadenopathy by size criteria. Lungs/Pleura: The central tracheo-bronchial tree is patent. Moderate diffuse emphysematous changes noted throughout bilateral lungs. There is increasing scarring/atelectasis in the left lung upper lobe and superior segment of left lung lower lobe, likely related to recent radiation therapy. There is an elongated approximately 1.2 x 2.2 cm new opacity along the left major fissure (series 7, image 97), also favored to represent postradiation changes. Previously seen mixed solid and cystic nodule in the left lung upper lobe is not distinctly seen on today's exam. There is no focal opacity around the fiducial marker. Previously seen subpleural 4 mm solid noncalcified nodule in the left lung lower lobe (series 5, image 81), is also unchanged. No new or suspicious lung nodule. No new mass or consolidation. No pleural effusion or pneumothorax. Upper Abdomen: There multiple diverticula in the splenic flexure of colon without diverticulitis. There is mild-to-moderate narrowing of the superior mesenteric artery in the proximal portion (series 2, image 135), due to noncalcified plaque. Remaining visualized upper abdominal viscera within normal limits. Musculoskeletal: The visualized soft tissues of the chest wall are grossly unremarkable. No suspicious osseous lesions. There are mild to moderate multilevel degenerative changes in the visualized spine. IMPRESSION: 1. There is increasing scarring/atelectasis in the left lung upper lobe and superior segment of left lung lower lobe, likely related to recent radiation therapy. 2.  Previously seen mixed solid and cystic nodule in the left lung upper lobe is not distinctly seen on today's exam. There is no focal opacity around the fiducial marker. No new or suspicious lung nodule. No new mass or consolidation. 3. Multiple other nonacute observations, as described above. Aortic Atherosclerosis (ICD10-I70.0) and Emphysema (ICD10-J43.9). Electronically Signed   By: Beula Brunswick M.D.   On: 06/29/2023 10:00     ASSESSMENT AND PLAN: This is a 73 years old African-American female with Unresectable stage IIb (T1c, N1,  M0) non-small cell lung cancer, squamous cell carcinoma diagnosed in October 2024 and presented with left upper lobe lung nodule in addition to left hilar lymphadenopathy.  The patient not a good surgical candidate because of poor pulmonary function.  She has been very angry and frustrated about the delay in her diagnosis when she found to have a small nodule in her lungs last year on a screening scan.  She has been bringing her frustration on us  since the first time I saw her in October 2024.  The patient underwent a course of concurrent chemoradiation with weekly carboplatin  for AUC of 2 and paclitaxel  45 Mg/M2 status post 5 cycles.  She has been tolerating her treatment well except for fatigue. The patient is currently on observation. Assessment and Plan    Unresectable Stage 2B Non-Small Cell Lung Cancer Unresectable Stage 2B (T1c, N1, M0) non-small cell lung cancer, squamous cell carcinoma, diagnosed in October 2024. Completed concurrent chemoradiation with weekly carboplatin  in January 2025. Current imaging shows no distinct nodule in the left upper lobe, indicating a positive response to treatment. No new masses or suspicious lymph nodes are present. Mildly prominent mediastinal hilar lymph nodes do not meet criteria for lymphadenopathy. Radiation-induced scarring is present, which is expected post-treatment. She is concerned about the scarring, but it is a common  outcome of radiation therapy. - Schedule follow-up scan in four months to monitor for any changes or recurrence.  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 This is a common post-radiation change and is being monitored for progression or improvement. She is informed that the scarring is a result of the radiation treatment and not medication. - Monitor scarring through follow-up imaging in four months.  Right Shoulder Pain Chronic right shoulder pain persisting throughout the year, affecting daily activities such as washing hair. Differential diagnosis includes rotator cuff injury or arthritis. Advised to seek orthopedic evaluation for further assessment and management. - Refer to orthopedic surgery for evaluation of right shoulder pain. - Advise to discuss referral with primary care physician for orthopedic consultation.   She was advised to call immediately if she has any other concerning symptoms in the interval. The patient voices understanding of current disease status and treatment options and is in agreement with the current care plan.  All questions were answered. The patient knows to call the clinic with any problems, questions or concerns. We can certainly see the patient much sooner if necessary.  The total time spent in the appointment was 30 minutes including review of chart and various tests results, discussions about plan of care and coordination of care plan .   Disclaimer: This note was dictated with voice recognition software. Similar sounding words can inadvertently be transcribed and may not be corrected upon review.

## 2023-07-08 DIAGNOSIS — N1831 Chronic kidney disease, stage 3a: Secondary | ICD-10-CM | POA: Diagnosis not present

## 2023-07-08 DIAGNOSIS — E1122 Type 2 diabetes mellitus with diabetic chronic kidney disease: Secondary | ICD-10-CM | POA: Diagnosis not present

## 2023-07-08 DIAGNOSIS — I10 Essential (primary) hypertension: Secondary | ICD-10-CM | POA: Diagnosis not present

## 2023-07-11 ENCOUNTER — Ambulatory Visit (HOSPITAL_COMMUNITY): Admission: EM | Admit: 2023-07-11 | Discharge: 2023-07-11 | Disposition: A | Discharge status: Home / Self Care

## 2023-07-11 ENCOUNTER — Emergency Department (HOSPITAL_COMMUNITY)
Admission: EM | Admit: 2023-07-11 | Discharge: 2023-07-11 | Discharge status: Against Medical Advice | Attending: Emergency Medicine | Admitting: Emergency Medicine

## 2023-07-11 ENCOUNTER — Encounter (HOSPITAL_COMMUNITY): Payer: Self-pay | Admitting: Radiology

## 2023-07-11 ENCOUNTER — Encounter (HOSPITAL_COMMUNITY): Payer: Self-pay | Admitting: Emergency Medicine

## 2023-07-11 ENCOUNTER — Other Ambulatory Visit: Payer: Self-pay

## 2023-07-11 DIAGNOSIS — K921 Melena: Secondary | ICD-10-CM | POA: Insufficient documentation

## 2023-07-11 DIAGNOSIS — Z5321 Procedure and treatment not carried out due to patient leaving prior to being seen by health care provider: Secondary | ICD-10-CM | POA: Insufficient documentation

## 2023-07-11 DIAGNOSIS — R531 Weakness: Secondary | ICD-10-CM | POA: Insufficient documentation

## 2023-07-11 LAB — COMPREHENSIVE METABOLIC PANEL WITH GFR
ALT: 18 U/L (ref 0–44)
AST: 20 U/L (ref 15–41)
Albumin: 3.5 g/dL (ref 3.5–5.0)
Alkaline Phosphatase: 91 U/L (ref 38–126)
Anion gap: 14 (ref 5–15)
BUN: 10 mg/dL (ref 8–23)
CO2: 22 mmol/L (ref 22–32)
Calcium: 9.4 mg/dL (ref 8.9–10.3)
Chloride: 104 mmol/L (ref 98–111)
Creatinine, Ser: 1.03 mg/dL — ABNORMAL HIGH (ref 0.44–1.00)
GFR, Estimated: 57 mL/min — ABNORMAL LOW (ref >60)
Glucose, Bld: 85 mg/dL (ref 70–99)
Potassium: 3.9 mmol/L (ref 3.5–5.1)
Sodium: 140 mmol/L (ref 135–145)
Total Bilirubin: 0.7 mg/dL (ref 0.0–1.2)
Total Protein: 7 g/dL (ref 6.5–8.1)

## 2023-07-11 LAB — CBC
HCT: 45 % (ref 36.0–46.0)
Hemoglobin: 14.5 g/dL (ref 12.0–15.0)
MCH: 28 pg (ref 26.0–34.0)
MCHC: 32.2 g/dL (ref 30.0–36.0)
MCV: 86.9 fL (ref 80.0–100.0)
Platelets: 264 10*3/uL (ref 150–400)
RBC: 5.18 MIL/uL — ABNORMAL HIGH (ref 3.87–5.11)
RDW: 16.1 % — ABNORMAL HIGH (ref 11.5–15.5)
WBC: 4.4 10*3/uL (ref 4.0–10.5)
nRBC: 0 % (ref 0.0–0.2)

## 2023-07-11 LAB — TYPE AND SCREEN
ABO/RH(D): A POS
Antibody Screen: NEGATIVE

## 2023-07-11 NOTE — ED Triage Notes (Signed)
 Pt complaining of feeling weak. Pt did go to urgent care but they referred her to come here.

## 2023-07-11 NOTE — ED Provider Notes (Signed)
 Patient reports to UC today for evaluation of black stools over the last week that have went from formed to now transitioned to grainy liquid. She had significant medical history including stomach/ intestinal ulceration and SCC of lung- currently is also on plavix . Recommended further evaluation in the ED for stat labs and imaging. Patient is agreeable and will transport herself next door via POV for further work up.    Vernestine Gondola, PA-C 07/11/23 1708

## 2023-07-11 NOTE — ED Notes (Signed)
 Patient is being discharged from the Urgent Care and sent to the Emergency Department via POV. Per Jami Mcclintock, PA, patient is in need of higher level of care due to dark black, loose stools (on Plavix ), weakness and fatigue. Patient is aware and verbalizes understanding of plan of care.  Vitals:   07/11/23 1700  BP: 136/69  Pulse: 76  Resp: 17  Temp: (!) 97.5 F (36.4 C)  SpO2: 94%

## 2023-07-11 NOTE — ED Triage Notes (Signed)
 Pt states that for the past week she has had dark stools. Last week her stools were grainy and now it is loose. States for the past few days when she eats she has to run to the restroom  due to urgency. She states she hasn't noticed blood just the darkness.

## 2023-07-11 NOTE — ED Notes (Signed)
 Pt. Did not want to wait, they decided to leave.

## 2023-07-11 NOTE — ED Triage Notes (Signed)
 Pt reports having weakness and fatigue that started today.  For over a week having black stools. Last week had formed stools, then turned to loose and now "grainy". Reports eating and when goes to bathroom because the urge to have a BM, she will already go before can get there.  Pt having nausea that started Sunday.

## 2023-07-13 DIAGNOSIS — R110 Nausea: Secondary | ICD-10-CM | POA: Diagnosis not present

## 2023-07-13 DIAGNOSIS — R1032 Left lower quadrant pain: Secondary | ICD-10-CM | POA: Diagnosis not present

## 2023-07-13 DIAGNOSIS — K5900 Constipation, unspecified: Secondary | ICD-10-CM | POA: Diagnosis not present

## 2023-07-13 DIAGNOSIS — K921 Melena: Secondary | ICD-10-CM | POA: Diagnosis not present

## 2023-07-13 DIAGNOSIS — K5730 Diverticulosis of large intestine without perforation or abscess without bleeding: Secondary | ICD-10-CM | POA: Diagnosis not present

## 2023-07-14 ENCOUNTER — Inpatient Hospital Stay: Admission: RE | Admit: 2023-07-14 | Payer: Medicare Other | Source: Ambulatory Visit

## 2023-07-19 ENCOUNTER — Other Ambulatory Visit: Payer: Self-pay | Admitting: Family Medicine

## 2023-07-19 DIAGNOSIS — E2839 Other primary ovarian failure: Secondary | ICD-10-CM

## 2023-07-28 DIAGNOSIS — K589 Irritable bowel syndrome without diarrhea: Secondary | ICD-10-CM | POA: Diagnosis not present

## 2023-07-28 DIAGNOSIS — K5901 Slow transit constipation: Secondary | ICD-10-CM | POA: Diagnosis not present

## 2023-08-02 ENCOUNTER — Encounter: Payer: Self-pay | Admitting: Family Medicine

## 2023-08-05 ENCOUNTER — Encounter (HOSPITAL_COMMUNITY): Payer: Self-pay | Admitting: Interventional Radiology

## 2023-08-07 DIAGNOSIS — N1831 Chronic kidney disease, stage 3a: Secondary | ICD-10-CM | POA: Diagnosis not present

## 2023-08-07 DIAGNOSIS — I10 Essential (primary) hypertension: Secondary | ICD-10-CM | POA: Diagnosis not present

## 2023-08-07 DIAGNOSIS — E1122 Type 2 diabetes mellitus with diabetic chronic kidney disease: Secondary | ICD-10-CM | POA: Diagnosis not present

## 2023-09-05 NOTE — Telephone Encounter (Signed)
 SABRA

## 2023-09-07 DIAGNOSIS — E1122 Type 2 diabetes mellitus with diabetic chronic kidney disease: Secondary | ICD-10-CM | POA: Diagnosis not present

## 2023-09-07 DIAGNOSIS — I10 Essential (primary) hypertension: Secondary | ICD-10-CM | POA: Diagnosis not present

## 2023-09-07 DIAGNOSIS — N1831 Chronic kidney disease, stage 3a: Secondary | ICD-10-CM | POA: Diagnosis not present

## 2023-10-08 DIAGNOSIS — N1831 Chronic kidney disease, stage 3a: Secondary | ICD-10-CM | POA: Diagnosis not present

## 2023-10-08 DIAGNOSIS — I10 Essential (primary) hypertension: Secondary | ICD-10-CM | POA: Diagnosis not present

## 2023-10-08 DIAGNOSIS — E1122 Type 2 diabetes mellitus with diabetic chronic kidney disease: Secondary | ICD-10-CM | POA: Diagnosis not present

## 2023-10-26 ENCOUNTER — Inpatient Hospital Stay: Attending: Internal Medicine

## 2023-10-26 ENCOUNTER — Ambulatory Visit (HOSPITAL_COMMUNITY)
Admission: RE | Admit: 2023-10-26 | Discharge: 2023-10-26 | Disposition: A | Discharge status: Home / Self Care | Source: Ambulatory Visit | Attending: Internal Medicine | Admitting: Internal Medicine

## 2023-10-26 DIAGNOSIS — J9811 Atelectasis: Secondary | ICD-10-CM | POA: Diagnosis not present

## 2023-10-26 DIAGNOSIS — C3490 Malignant neoplasm of unspecified part of unspecified bronchus or lung: Secondary | ICD-10-CM | POA: Insufficient documentation

## 2023-10-26 DIAGNOSIS — C3412 Malignant neoplasm of upper lobe, left bronchus or lung: Secondary | ICD-10-CM | POA: Diagnosis not present

## 2023-10-26 LAB — CMP (CANCER CENTER ONLY)
ALT: 18 U/L (ref 0–44)
AST: 20 U/L (ref 15–41)
Albumin: 3.7 g/dL (ref 3.5–5.0)
Alkaline Phosphatase: 105 U/L (ref 38–126)
Anion gap: 6 (ref 5–15)
BUN: 15 mg/dL (ref 8–23)
CO2: 28 mmol/L (ref 22–32)
Calcium: 8.8 mg/dL — ABNORMAL LOW (ref 8.9–10.3)
Chloride: 107 mmol/L (ref 98–111)
Creatinine: 1.12 mg/dL — ABNORMAL HIGH (ref 0.44–1.00)
GFR, Estimated: 52 mL/min — ABNORMAL LOW (ref >60)
Glucose, Bld: 134 mg/dL — ABNORMAL HIGH (ref 70–99)
Potassium: 4.3 mmol/L (ref 3.5–5.1)
Sodium: 141 mmol/L (ref 135–145)
Total Bilirubin: 0.6 mg/dL (ref 0.0–1.2)
Total Protein: 6.9 g/dL (ref 6.5–8.1)

## 2023-10-26 LAB — CBC WITH DIFFERENTIAL (CANCER CENTER ONLY)
Abs Immature Granulocytes: 0.02 K/uL (ref 0.00–0.07)
Basophils Absolute: 0 K/uL (ref 0.0–0.1)
Basophils Relative: 1 %
Eosinophils Absolute: 0 K/uL (ref 0.0–0.5)
Eosinophils Relative: 0 %
HCT: 42.9 % (ref 36.0–46.0)
Hemoglobin: 13.9 g/dL (ref 12.0–15.0)
Immature Granulocytes: 0 %
Lymphocytes Relative: 17 %
Lymphs Abs: 0.8 K/uL (ref 0.7–4.0)
MCH: 27.6 pg (ref 26.0–34.0)
MCHC: 32.4 g/dL (ref 30.0–36.0)
MCV: 85.3 fL (ref 80.0–100.0)
Monocytes Absolute: 0.5 K/uL (ref 0.1–1.0)
Monocytes Relative: 11 %
Neutro Abs: 3.2 K/uL (ref 1.7–7.7)
Neutrophils Relative %: 71 %
Platelet Count: 261 K/uL (ref 150–400)
RBC: 5.03 MIL/uL (ref 3.87–5.11)
RDW: 17.1 % — ABNORMAL HIGH (ref 11.5–15.5)
WBC Count: 4.5 K/uL (ref 4.0–10.5)
nRBC: 0 % (ref 0.0–0.2)

## 2023-10-26 MED ORDER — IOHEXOL 300 MG/ML  SOLN
75.0000 mL | Freq: Once | INTRAMUSCULAR | Status: AC | PRN
  Administered 2023-10-26: 75 mL via INTRAVENOUS

## 2023-11-02 ENCOUNTER — Telehealth: Payer: Self-pay | Admitting: Physician Assistant

## 2023-11-02 ENCOUNTER — Inpatient Hospital Stay: Admitting: Internal Medicine

## 2023-11-02 NOTE — Telephone Encounter (Signed)
 Rescheduled appointment with the patient per car troubles.

## 2023-11-07 DIAGNOSIS — N1831 Chronic kidney disease, stage 3a: Secondary | ICD-10-CM | POA: Diagnosis not present

## 2023-11-07 DIAGNOSIS — E1122 Type 2 diabetes mellitus with diabetic chronic kidney disease: Secondary | ICD-10-CM | POA: Diagnosis not present

## 2023-11-07 DIAGNOSIS — I10 Essential (primary) hypertension: Secondary | ICD-10-CM | POA: Diagnosis not present

## 2023-11-09 ENCOUNTER — Inpatient Hospital Stay: Attending: Internal Medicine | Admitting: Internal Medicine

## 2023-11-09 VITALS — BP 136/88 | HR 78 | Temp 97.8°F | Resp 17 | Ht 61.0 in | Wt 146.4 lb

## 2023-11-09 DIAGNOSIS — Z7902 Long term (current) use of antithrombotics/antiplatelets: Secondary | ICD-10-CM | POA: Insufficient documentation

## 2023-11-09 DIAGNOSIS — J439 Emphysema, unspecified: Secondary | ICD-10-CM | POA: Insufficient documentation

## 2023-11-09 DIAGNOSIS — C3490 Malignant neoplasm of unspecified part of unspecified bronchus or lung: Secondary | ICD-10-CM

## 2023-11-09 DIAGNOSIS — Y842 Radiological procedure and radiotherapy as the cause of abnormal reaction of the patient, or of later complication, without mention of misadventure at the time of the procedure: Secondary | ICD-10-CM | POA: Diagnosis not present

## 2023-11-09 DIAGNOSIS — Z923 Personal history of irradiation: Secondary | ICD-10-CM | POA: Diagnosis not present

## 2023-11-09 DIAGNOSIS — Z86718 Personal history of other venous thrombosis and embolism: Secondary | ICD-10-CM | POA: Diagnosis not present

## 2023-11-09 DIAGNOSIS — Z79899 Other long term (current) drug therapy: Secondary | ICD-10-CM | POA: Diagnosis not present

## 2023-11-09 DIAGNOSIS — Z7982 Long term (current) use of aspirin: Secondary | ICD-10-CM | POA: Insufficient documentation

## 2023-11-09 DIAGNOSIS — J701 Chronic and other pulmonary manifestations due to radiation: Secondary | ICD-10-CM | POA: Insufficient documentation

## 2023-11-09 DIAGNOSIS — I700 Atherosclerosis of aorta: Secondary | ICD-10-CM | POA: Insufficient documentation

## 2023-11-09 DIAGNOSIS — R590 Localized enlarged lymph nodes: Secondary | ICD-10-CM | POA: Insufficient documentation

## 2023-11-09 DIAGNOSIS — Z9221 Personal history of antineoplastic chemotherapy: Secondary | ICD-10-CM | POA: Diagnosis not present

## 2023-11-09 DIAGNOSIS — Z885 Allergy status to narcotic agent status: Secondary | ICD-10-CM | POA: Diagnosis not present

## 2023-11-09 DIAGNOSIS — Z8711 Personal history of peptic ulcer disease: Secondary | ICD-10-CM | POA: Insufficient documentation

## 2023-11-09 DIAGNOSIS — Z860101 Personal history of adenomatous and serrated colon polyps: Secondary | ICD-10-CM | POA: Insufficient documentation

## 2023-11-09 DIAGNOSIS — Z883 Allergy status to other anti-infective agents status: Secondary | ICD-10-CM | POA: Diagnosis not present

## 2023-11-09 DIAGNOSIS — Z8719 Personal history of other diseases of the digestive system: Secondary | ICD-10-CM | POA: Insufficient documentation

## 2023-11-09 DIAGNOSIS — Z881 Allergy status to other antibiotic agents status: Secondary | ICD-10-CM | POA: Insufficient documentation

## 2023-11-09 DIAGNOSIS — C3412 Malignant neoplasm of upper lobe, left bronchus or lung: Secondary | ICD-10-CM | POA: Insufficient documentation

## 2023-11-09 NOTE — Progress Notes (Signed)
 [facility] [phone]   [phone]  OFFICE PROGRESS NOTE  Rolinda Millman, MD [phone] [address] [place] [address]  DIAGNOSIS: Unresectable stage IIb (T1c, N1, M0) non-small cell lung cancer, squamous cell carcinoma diagnosed in October 2024 and presented with left upper lobe lung nodule in addition to left hilar lymphadenopathy.  The patient not a good surgical candidate because of poor pulmonary function.  PRIOR THERAPY: A course of concurrent chemoradiation with weekly carboplatin  for AUC of 2 and paclitaxel  45 Mg/M2.  Status post 6 cycles.  Last dose was given February 27, 2023.  CURRENT THERAPY: Observation.  INTERVAL HISTORY: Vicki Page 73 y.o. female returns to the clinic today for follow-up visit. Discussed the use of AI scribe software for clinical note transcription with the patient, who gave verbal consent to proceed.  History of Present Illness Vicki Page is a 73 year old female with unresectable stage II small cell lung cancer who presents for evaluation with a repeat CT scan of the chest for restaging of her disease.  She was diagnosed with unresectable stage II non-small cell lung cancer, squamous cell carcinoma, in October 2024. She completed a course of concurrent chemoradiation with weekly carboplatin  and paclitaxel , with the last dose administered in January 2025. Since then, she has been under observation and is here today for a repeat CT scan of the chest to restage her disease.  She experiences pain in her right arm, which started last year. The pain is exacerbated by activities such as washing her hair or taking a shower. Being right-handed, this impacts her daily activities.  She denies shortness of breath, chest pain, or hemoptysis.    MEDICAL HISTORY: Past Medical History:  Diagnosis Date   Adenomatous colon polyp    Aneurysm    brain x 2   Anxiety    Arthritis    Atherosclerosis     Barrett's esophagus    Carotid artery disease    Cataracts, bilateral    just watching   DDD (degenerative disc disease), lumbar    Depression    Diabetes mellitus without complication (HCC)    Diverticulosis    DVT (deep venous thrombosis) (HCC)    left leg   Fall at home 05/30/2014   Pt slipped in tub- hurt left hip and right shoulder   GERD (gastroesophageal reflux disease)    H. pylori infection    History of radiation therapy    Left lung-01/19/23-03/03/23- Dr. Lynwood Nasuti   Hyperlipidemia    Hypertension    Hypothyroidism    IBS (irritable bowel syndrome)    Osteoarthritis    Poor sleep 04/28/2020   Pre-diabetes    no meds, diet controlled   RLS (restless legs syndrome)    Tubular adenoma of colon    Ulcer of the stomach and intestine     ALLERGIES:  is allergic to fluconazole , ropinirole, codeine, darvon [propoxyphene hcl], and flagyl  [metronidazole ].  MEDICATIONS:  Current Outpatient Medications  Medication Sig Dispense Refill   acetaminophen  (TYLENOL ) 500 MG tablet Take 1,000 mg by mouth 2 (two) times daily as needed for pain.      amLODipine (NORVASC) 10 MG tablet Take 10 mg by mouth daily. for high blood pressure     aspirin  EC 81 MG tablet Take 81 mg by mouth daily.     atorvastatin (LIPITOR) 80 MG tablet Take 80 mg by mouth daily.     buPROPion (WELLBUTRIN SR)  150 MG 12 hr tablet Take 150 mg by mouth 2 (two) times daily.     Calcium-Vitamin D (CALTRATE 600 PLUS-VIT D PO) Take 1 tablet by mouth daily before supper.      Cholecalciferol (VITAMIN D3) 10 MCG (400 UNIT) CAPS Take 400 Units by mouth daily.     clopidogrel  (PLAVIX ) 75 MG tablet Take 1 tablet (75 mg total) by mouth daily with breakfast. Okay to restart on 11/22/2022.     escitalopram (LEXAPRO) 10 MG tablet Take 1 tablet by mouth daily.     LORazepam  (ATIVAN ) 0.5 MG tablet 1 tablet p.o. 30 minutes before MRI.  Repeat x 1 if needed 2 tablet 0   losartan (COZAAR) 50 MG tablet Take 75 mg by mouth daily.      Magnesium 200 MG TABS Take 200 mg by mouth daily.     Multiple Vitamin (MULTIVITAMIN WITH MINERALS) TABS tablet Take 1 tablet by mouth daily before supper. Centrum Silver     ondansetron  (ZOFRAN ) 8 MG tablet Take 8 mg by mouth every 8 (eight) hours as needed.     pantoprazole  (PROTONIX ) 40 MG tablet Take 1 tablet (40 mg total) by mouth daily. 30 tablet 2   Probiotic Product (PROBIOTIC PO) Take 1 tablet by mouth daily after lunch.      Psyllium (METAMUCIL) 28.3 % POWD Take 1 Dose by mouth daily. 2 teaspoons full = 1 dose     Tetrahydrozoline HCl (VISINE OP) Place 1 drop into both eyes daily as needed (irritation/dry eyes).     vitamin B-12 (CYANOCOBALAMIN ) 1000 MCG tablet Take 1,000 mcg by mouth daily.     vitamin C (ASCORBIC ACID) 500 MG tablet Take 500 mg by mouth daily.     No current facility-administered medications for this visit.    SURGICAL HISTORY:  Past Surgical History:  Procedure Laterality Date   ANAL RECTAL MANOMETRY N/A 05/04/2016   Procedure: ANO RECTAL MANOMETRY;  Surgeon: Gustav Shila GAILS, MD;  Location: [facility] ENDOSCOPY;  Service: Endoscopy;  Laterality: N/A;   ANEURYSM COILING  01/28/2010   stent-assisted coiling LICA aneurysm   BRAIN SURGERY     Per patient  Left side behind eyes   BRONCHIAL BIOPSY  11/21/2022   Procedure: BRONCHIAL BIOPSIES;  Surgeon: Shelah Lamar RAMAN, MD;  Location: [facility] ENDOSCOPY;  Service: Pulmonary;;   BRONCHIAL BRUSHINGS  11/21/2022   Procedure: BRONCHIAL BRUSHINGS;  Surgeon: Shelah Lamar RAMAN, MD;  Location: [facility] ENDOSCOPY;  Service: Pulmonary;;   BRONCHIAL NEEDLE ASPIRATION BIOPSY  11/21/2022   Procedure: BRONCHIAL NEEDLE ASPIRATION BIOPSIES;  Surgeon: Shelah Lamar RAMAN, MD;  Location: [facility] ENDOSCOPY;  Service: Pulmonary;;   BRONCHIAL WASHINGS  11/21/2022   Procedure: BRONCHIAL WASHINGS;  Surgeon: Shelah Lamar RAMAN, MD;  Location: [facility] ENDOSCOPY;  Service: Pulmonary;;   FIDUCIAL MARKER PLACEMENT  11/21/2022   Procedure: FIDUCIAL MARKER PLACEMENT;   Surgeon: Shelah Lamar RAMAN, MD;  Location: [facility] ENDOSCOPY;  Service: Pulmonary;;   INSERTION OF ILIAC STENT  10/04/2012   Procedure: INSERTION OF ILIAC STENT;  Surgeon: Candyce RAMAN Reek, MD;  Location: [facility] CATH LAB;  Service: Cardiovascular;;  Left External Iliac Artery   IR ANGIO INTRA EXTRACRAN SEL COM CAROTID INNOMINATE BILAT MOD SED  11/23/2018   IR ANGIO INTRA EXTRACRAN SEL COM CAROTID INNOMINATE BILAT MOD SED  12/03/2020   IR ANGIO VERTEBRAL SEL SUBCLAVIAN INNOMINATE UNI L MOD SED  11/23/2018   IR ANGIO VERTEBRAL SEL VERTEBRAL BILAT MOD SED  12/03/2020   IR ANGIO VERTEBRAL SEL VERTEBRAL UNI  L MOD SED  12/10/2018   IR ANGIO VERTEBRAL SEL VERTEBRAL UNI R MOD SED  11/23/2018   IR GENERIC HISTORICAL  11/03/2015   IR ANGIO VERTEBRAL SEL VERTEBRAL UNI L MOD SED 11/03/2015 Thyra Nash, MD [facility]   IR GENERIC HISTORICAL  11/03/2015   IR ANGIO INTRA EXTRACRAN SEL COM CAROTID INNOMINATE BILAT MOD SED 11/03/2015 Thyra Nash, MD [facility]   IR GENERIC HISTORICAL  11/03/2015   IR ANGIO VERTEBRAL SEL SUBCLAVIAN INNOMINATE UNI R MOD SED 11/03/2015 Thyra Nash, MD [facility]   IR GENERIC HISTORICAL  11/19/2015   IR RADIOLOGIST EVAL & MGMT 11/19/2015 [facility]   IR RADIOLOGIST EVAL & MGMT  02/15/2022   IR US  GUIDE VASC ACCESS RIGHT  11/23/2018   IR US  GUIDE VASC ACCESS RIGHT  12/03/2020   LEG SURGERY     LOWER EXTREMITY ANGIOGRAM Left 10/04/2012   and Left iliac stent   LOWER EXTREMITY ANGIOGRAM N/A 10/04/2012   Procedure: LOWER EXTREMITY ANGIOGRAM;  Surgeon: Candyce GORMAN Reek, MD;  Location: [facility] CATH LAB;  Service: Cardiovascular;  Laterality: N/A;   RADIOLOGY WITH ANESTHESIA N/A 12/10/2018   Procedure: STENTING;  Surgeon: Nash Thyra, MD;  Location: [facility] OR;  Service: Radiology;  Laterality: N/A;   TUBAL LIGATION      REVIEW OF SYSTEMS:  A comprehensive review of systems was negative except for: Musculoskeletal: positive for arthralgias   PHYSICAL  EXAMINATION: General appearance: alert, cooperative, and no distress Head: Normocephalic, without obvious abnormality, atraumatic Neck: no adenopathy, no JVD, supple, symmetrical, trachea midline, and thyroid  not enlarged, symmetric, no tenderness/mass/nodules Lymph nodes: Cervical, supraclavicular, and axillary nodes normal. Resp: clear to auscultation bilaterally Back: symmetric, no curvature. ROM normal. No CVA tenderness. Cardio: regular rate and rhythm, S1, S2 normal, no murmur, click, rub or gallop GI: soft, non-tender; bowel sounds normal; no masses,  no organomegaly Extremities: extremities normal, atraumatic, no cyanosis or edema  ECOG PERFORMANCE STATUS: 1 - Symptomatic but completely ambulatory  Blood pressure 136/88, pulse 78, temperature 97.8 F (36.6 C), resp. rate 17, height 5' 1 (1.549 m), weight 146 lb 6.4 oz (66.4 kg), SpO2 95%.  LABORATORY DATA: Lab Results  Component Value Date   WBC 4.5 10/26/2023   HGB 13.9 10/26/2023   HCT 42.9 10/26/2023   MCV 85.3 10/26/2023   PLT 261 10/26/2023      Chemistry      Component Value Date/Time   NA 141 10/26/2023 0937   K 4.3 10/26/2023 0937   CL 107 10/26/2023 0937   CO2 28 10/26/2023 0937   BUN 15 10/26/2023 0937   CREATININE 1.12 (H) 10/26/2023 0937      Component Value Date/Time   CALCIUM 8.8 (L) 10/26/2023 0937   ALKPHOS 105 10/26/2023 0937   AST 20 10/26/2023 0937   ALT 18 10/26/2023 0937   BILITOT 0.6 10/26/2023 0937       RADIOGRAPHIC STUDIES: CT Chest W Contrast Result Date: 10/26/2023 CLINICAL DATA:  Non-small cell lung cancer (NSCLC), staging. * Tracking Code: BO * EXAM: CT CHEST WITH CONTRAST TECHNIQUE: Multidetector CT imaging of the chest was performed during intravenous contrast administration. RADIATION DOSE REDUCTION: This exam was performed according to the departmental dose-optimization program which includes automated exposure control, adjustment of the mA and/or kV according to patient size  and/or use of iterative reconstruction technique. CONTRAST:  75mL OMNIPAQUE  IOHEXOL  300 MG/ML  SOLN COMPARISON:  CT scan chest from 06/28/2023. FINDINGS: Cardiovascular: Normal cardiac size. No pericardial effusion. No aortic aneurysm.  There are coronary artery calcifications, in keeping with coronary artery disease. There are also mild-to-moderate peripheral atherosclerotic vascular calcifications of thoracic aorta and its major branches. Mediastinum/Nodes: Visualized thyroid  gland appears grossly unremarkable. No solid / cystic mediastinal masses. The esophagus is nondistended precluding optimal assessment. There are few mildly prominent mediastinal lymph nodes, which do not meet the size criteria for lymphadenopathy and appear grossly similar to the prior study, favoring benign etiology. No axillary or hilar lymphadenopathy by size criteria. Lungs/Pleura: The central tracheo-bronchial tree is patent. Mild-to-moderate upper lobe predominant emphysematous changes noted. Redemonstration of irregular areas of scarring/atelectasis mainly in the anterior segment of left upper lobe and superior segment of left lower lobe, which also contains a fiducial marker. There is slight interval decrease in the linear opacities when compared to the prior exam. Findings again favor evolving posttreatment changes. No new mass or consolidation. No pleural effusion or pneumothorax. No suspicious lung nodules. Upper Abdomen: There are scattered diverticula in the splenic flexure of colon without diverticulitis. Remaining visualized upper abdominal viscera within normal limits. Musculoskeletal: The visualized soft tissues of the chest wall are grossly unremarkable. No suspicious osseous lesions. There are moderate multilevel degenerative changes in the visualized spine. IMPRESSION: 1. Redemonstration of irregular areas of scarring/atelectasis mainly in the anterior segment of left upper lobe and superior segment of left lower lobe,  which also contains a fiducial marker. There is slight interval decrease in the linear opacities when compared to the prior exam. Findings favor evolving posttreatment changes. No new mass or consolidation. No suspicious lung nodules. 2. Multiple other nonacute observations, as described above. Electronically Signed   By: Ree Molt M.D.   On: 10/26/2023 13:48     ASSESSMENT AND PLAN: This is a 73 years old African-American female with Unresectable stage IIb (T1c, N1, M0) non-small cell lung cancer, squamous cell carcinoma diagnosed in October 2024 and presented with left upper lobe lung nodule in addition to left hilar lymphadenopathy.  The patient not a good surgical candidate because of poor pulmonary function.  She has been very angry and frustrated about the delay in her diagnosis when she found to have a small nodule in her lungs last year on a screening scan.  She has been bringing her frustration on us  since the first time I saw her in October 2024.  The patient underwent a course of concurrent chemoradiation with weekly carboplatin  for AUC of 2 and paclitaxel  45 Mg/M2 status post 5 cycles.  She has been tolerating her treatment well except for fatigue. The patient is currently on observation. The patient had repeat CT scan of the chest performed recently.  I personally and independently reviewed the scan and discussed the results with the patient today.  Her scan showed no concerning findings for disease recurrence or metastasis. Assessment and Plan Assessment & Plan Unresectable stage II squamous cell carcinoma of the lung, post-chemoradiation Diagnosed in October 2024. Completed concurrent chemoradiation with weekly carboplatin  and paclitaxel , last dose in January 2025. Recent CT scan shows no disease progression, only radiation scarring. No new growths or lymph node involvement. - Continue observation with follow-up every six months. - Repeat CT scan and laboratory tests in six  months.  Radiation-induced pulmonary fibrosis Present, evidenced by scarring on CT scan. Scarring is a result of previous radiation therapy and is expected to remain as a permanent mark. No indication of active disease or progression.  The patient voices understanding of current disease status and treatment options and is in agreement with  the current care plan.  All questions were answered. The patient knows to call the clinic with any problems, questions or concerns. We can certainly see the patient much sooner if necessary.  The total time spent in the appointment was 20 minutes including review of chart and various tests results, discussions about plan of care and coordination of care plan .   Disclaimer: This note was dictated with voice recognition software. Similar sounding words can inadvertently be transcribed and may not be corrected upon review.

## 2023-12-06 ENCOUNTER — Other Ambulatory Visit (HOSPITAL_BASED_OUTPATIENT_CLINIC_OR_DEPARTMENT_OTHER)

## 2023-12-19 ENCOUNTER — Other Ambulatory Visit: Payer: Self-pay | Admitting: Family Medicine

## 2023-12-19 DIAGNOSIS — Z1231 Encounter for screening mammogram for malignant neoplasm of breast: Secondary | ICD-10-CM

## 2023-12-22 ENCOUNTER — Other Ambulatory Visit: Payer: Self-pay

## 2023-12-22 DIAGNOSIS — I739 Peripheral vascular disease, unspecified: Secondary | ICD-10-CM

## 2024-01-11 ENCOUNTER — Inpatient Hospital Stay (HOSPITAL_BASED_OUTPATIENT_CLINIC_OR_DEPARTMENT_OTHER): Admission: RE | Admit: 2024-01-11 | Source: Ambulatory Visit

## 2024-01-15 ENCOUNTER — Ambulatory Visit (HOSPITAL_COMMUNITY)
Admission: RE | Admit: 2024-01-15 | Discharge: 2024-01-15 | Disposition: A | Source: Ambulatory Visit | Attending: Surgery | Admitting: Surgery

## 2024-01-15 ENCOUNTER — Ambulatory Visit: Admitting: Surgery

## 2024-01-15 DIAGNOSIS — I739 Peripheral vascular disease, unspecified: Secondary | ICD-10-CM

## 2024-01-15 LAB — VAS US ABI WITH/WO TBI
Left ABI: 0.88
Right ABI: 0.86

## 2024-01-30 ENCOUNTER — Ambulatory Visit
Admission: RE | Admit: 2024-01-30 | Discharge: 2024-01-30 | Disposition: A | Discharge status: Home / Self Care | Source: Ambulatory Visit | Attending: Family Medicine | Admitting: Family Medicine

## 2024-01-30 DIAGNOSIS — Z1231 Encounter for screening mammogram for malignant neoplasm of breast: Secondary | ICD-10-CM

## 2024-02-12 ENCOUNTER — Ambulatory Visit: Attending: Surgery | Admitting: Surgery

## 2024-02-12 ENCOUNTER — Encounter: Payer: Self-pay | Admitting: Surgery

## 2024-02-12 VITALS — BP 150/76 | HR 75 | Temp 98.0°F | Ht 61.0 in | Wt 150.0 lb

## 2024-02-12 DIAGNOSIS — I70213 Atherosclerosis of native arteries of extremities with intermittent claudication, bilateral legs: Secondary | ICD-10-CM

## 2024-02-12 NOTE — Progress Notes (Signed)
 "                                   [facility]  Patient name: Vicki Page MRN: [id number] DOB: [birth year] Sex: female   REQUESTING PROVIDER:    Dr. Rolinda   REASON FOR CONSULT:    PAD  HISTORY OF PRESENT ILLNESS:   Vicki Page is a 75 y.o. female, who I last saw and 2021 for carotid disease.  In 2021 she had 1 to 39% stenosis bilaterally.  She has a history of intracranial aneurysms.  The left side has been treated with embolization and stent placement in 2012.  Her right side is being monitored.  She also has a history of a left external iliac stent by Dr. Dann in 2014.  At that time she did complain of leg pain which she walked long distances and that she occasionally woke up at night with leg pain.  She now only complains of pain in her left knee.  Patient medical managed for hypertension.  She takes a statin for hypercholesterolemia.  She is on dual antiplatelet therapy.  She has stopped smoking  PAST MEDICAL HISTORY    Past Medical History:  Diagnosis Date   Adenomatous colon polyp    Aneurysm    brain x 2   Anxiety    Arthritis    Atherosclerosis    Barrett's esophagus    Carotid artery disease    Cataracts, bilateral    just watching   DDD (degenerative disc disease), lumbar    Depression    Diabetes mellitus without complication (HCC)    Diverticulosis    DVT (deep venous thrombosis) (HCC)    left leg   Fall at home 05/30/2014   Pt slipped in tub- hurt left hip and right shoulder   GERD (gastroesophageal reflux disease)    H. pylori infection    History of radiation therapy    Left lung-01/19/23-03/03/23- Dr. Lynwood Nasuti   Hyperlipidemia    Hypertension    Hypothyroidism    IBS (irritable bowel syndrome)    Osteoarthritis    Poor sleep 04/28/2020   Pre-diabetes    no meds, diet controlled   RLS (restless legs syndrome)    Tubular adenoma of colon    Ulcer of the stomach and intestine      FAMILY HISTORY    Family History  Problem Relation Age of Onset   Deep vein thrombosis Mother    Diabetes Mother        Amputation   Heart disease Mother        Heart Disease before age 55   Hyperlipidemia Mother    Hypertension Mother    Heart attack Mother    Stroke Mother    Thyroid  disease Mother    Heart disease Father        Heart Disease before age 55   Hypertension Father    Heart attack Father    Hyperlipidemia Father    Heart disease Brother        Heart Disease before age 18   Hyperlipidemia Brother    Hypertension Brother    Heart attack Brother    COPD Brother    Diabetes Brother    Colonic polyp Brother    Colon cancer Neg Hx    Stomach cancer Neg Hx    Breast cancer Neg Hx  SOCIAL HISTORY:   Social History   Socioeconomic History   Marital status: Single    Spouse name: Not on file   Number of children: 2   Years of education: 16   Highest education level: Not on file  Occupational History   Occupation: Retired  Tobacco Use   Smoking status: Former    Average packs/day: 0.3 packs/day    Types: Cigarettes    Start date: 11/27/2022   Smokeless tobacco: Never   Tobacco comments:    5 cigarettes smoked daily [facility] 03/11/20  Vaping Use   Vaping status: Former  Substance and Sexual Activity   Alcohol use: No    Alcohol/week: 0.0 standard drinks of alcohol   Drug use: No   Sexual activity: Never    Birth control/protection: Post-menopausal  Other Topics Concern   Not on file  Social History Narrative   Lives alone   Caffeine use: none   Right handed   Social Drivers of Health   Tobacco Use: Medium Risk (02/12/2024)   Patient History    Smoking Tobacco Use: Former    Smokeless Tobacco Use: Never    Passive Exposure: Not on Actuary Strain: High Risk (01/13/2023)   Overall Financial Resource Strain (CARDIA)    Difficulty of Paying Living Expenses: Very hard  Food Insecurity: Food Insecurity Present (04/06/2023)   Hunger Vital Sign     Worried About Running Out of Food in the Last Year: Sometimes true    Ran Out of Food in the Last Year: Sometimes true  Transportation Needs: No Transportation Needs (12/19/2022)   PRAPARE - Administrator, Civil Service (Medical): No    Lack of Transportation (Non-Medical): No  Physical Activity: Not on file  Stress: Not on file  Social Connections: Not on file  Intimate Partner Violence: Not At Risk (11/08/2023)   Epic    Fear of Current or Ex-Partner: No    Emotionally Abused: No    Physically Abused: No    Sexually Abused: No  Depression (PHQ2-9): Medium Risk (12/19/2022)   Depression (PHQ2-9)    PHQ-2 Score: 6  Alcohol Screen: Not on file  Housing: Low Risk (12/19/2022)   Housing    Last Housing Risk Score: 0  Utilities: Not At Risk (12/19/2022)   AHC Utilities    Threatened with loss of utilities: No  Health Literacy: Not on file    ALLERGIES:    Allergies[1]  CURRENT MEDICATIONS:    Current Outpatient Medications  Medication Sig Dispense Refill   acetaminophen  (TYLENOL ) 500 MG tablet Take 1,000 mg by mouth 2 (two) times daily as needed for pain.      amLODipine (NORVASC) 10 MG tablet Take 10 mg by mouth daily. for high blood pressure     aspirin  EC 81 MG tablet Take 81 mg by mouth daily.     atorvastatin (LIPITOR) 80 MG tablet Take 80 mg by mouth daily.     buPROPion (WELLBUTRIN SR) 150 MG 12 hr tablet Take 150 mg by mouth 2 (two) times daily.     Calcium-Vitamin D (CALTRATE 600 PLUS-VIT D PO) Take 1 tablet by mouth daily before supper.      Cholecalciferol (VITAMIN D3) 10 MCG (400 UNIT) CAPS Take 400 Units by mouth daily.     clopidogrel  (PLAVIX ) 75 MG tablet Take 1 tablet (75 mg total) by mouth daily with breakfast. Okay to restart on 11/22/2022.     escitalopram (LEXAPRO) 10 MG tablet Take 1  tablet by mouth daily.     LORazepam  (ATIVAN ) 0.5 MG tablet 1 tablet p.o. 30 minutes before MRI.  Repeat x 1 if needed 2 tablet 0   losartan (COZAAR) 50 MG  tablet Take 75 mg by mouth daily.     Magnesium 200 MG TABS Take 200 mg by mouth daily.     Multiple Vitamin (MULTIVITAMIN WITH MINERALS) TABS tablet Take 1 tablet by mouth daily before supper. Centrum Silver     ondansetron  (ZOFRAN ) 8 MG tablet Take 8 mg by mouth every 8 (eight) hours as needed.     pantoprazole  (PROTONIX ) 40 MG tablet Take 1 tablet (40 mg total) by mouth daily. 30 tablet 2   Probiotic Product (PROBIOTIC PO) Take 1 tablet by mouth daily after lunch.      Psyllium (METAMUCIL) 28.3 % POWD Take 1 Dose by mouth daily. 2 teaspoons full = 1 dose     Tetrahydrozoline HCl (VISINE OP) Place 1 drop into both eyes daily as needed (irritation/dry eyes).     vitamin B-12 (CYANOCOBALAMIN ) 1000 MCG tablet Take 1,000 mcg by mouth daily.     vitamin C (ASCORBIC ACID) 500 MG tablet Take 500 mg by mouth daily.     No current facility-administered medications for this visit.    REVIEW OF SYSTEMS:   [X]  denotes positive finding, [ ]  denotes negative finding Cardiac  Comments:  Chest pain or chest pressure:    Shortness of breath upon exertion:    Short of breath when lying flat:    Irregular heart rhythm:        Vascular    Pain in calf, thigh, or hip brought on by ambulation:    Pain in feet at night that wakes you up from your sleep:     Blood clot in your veins:    Leg swelling:         Pulmonary    Oxygen  at home:    Productive cough:     Wheezing:         Neurologic    Sudden weakness in arms or legs:     Sudden numbness in arms or legs:     Sudden onset of difficulty speaking or slurred speech:    Temporary loss of vision in one eye:     Problems with dizziness:         Gastrointestinal    Blood in stool:      Vomited blood:         Genitourinary    Burning when urinating:     Blood in urine:        Psychiatric    Major depression:         Hematologic    Bleeding problems:    Problems with blood clotting too easily:        Skin    Rashes or ulcers:         Constitutional    Fever or chills:     PHYSICAL EXAM:   Vitals:   02/12/24 1502  BP: (!) 150/76  Pulse: 75  Temp: 98 F (36.7 C)  SpO2: 92%  Weight: 150 lb (68 kg)  Height: 5' 1 (1.549 m)    GENERAL: The patient is a well-nourished female, in no acute distress. The vital signs are documented above. CARDIAC: There is a regular rate and rhythm.  VASCULAR: Nonpalpable pedal pulses PULMONARY: Nonlabored respirations MUSCULOSKELETAL: There are no major deformities or cyanosis. NEUROLOGIC: No focal weakness or paresthesias are  detected. SKIN: There are no ulcers or rashes noted. PSYCHIATRIC: The patient has a normal affect.  STUDIES:   I have reviewed the following: +-------+-----------+-----------+------------+------------+  ABI/TBIToday's ABIToday's TBIPrevious ABIPrevious TBI  +-------+-----------+-----------+------------+------------+  Right 0.86       0.63       1.07        0.79          +-------+-----------+-----------+------------+------------+  Left  0.88       0.61       1.07        0.77         Right toe pressure: 91 Left toe pressure: 88 All waveforms are biphasic  ASSESSMENT and PLAN   PAD: The patient has slightly diminished ABIs but does not have any lower extremity claudication symptoms.  The only pain she has with walking is in her left knee.  No intervention is recommended at this time however I stressed the importance of cholesterol management targeting an LDL of 55, blood pressure control, and continued smoking abstinence.  She will follow-up again in 1 year with ABIs and a carotid duplex   Malvina New, IV, MD, FACS [facility] [phone] Pager [phone]     [1]  Allergies Allergen Reactions   Fluconazole      Mouth swelling and soreness    Ropinirole Nausea And Vomiting   Codeine Nausea And Vomiting   Darvon [Propoxyphene Hcl] Nausea And Vomiting   Flagyl  [Metronidazole ] Nausea  And Vomiting   "

## 2024-06-19 ENCOUNTER — Other Ambulatory Visit (HOSPITAL_BASED_OUTPATIENT_CLINIC_OR_DEPARTMENT_OTHER)
